# Patient Record
Sex: Female | Born: 1945 | Race: White | Hispanic: No | Smoking: Never smoker
Health system: Southern US, Community
[De-identification: ages and names within clinical notes are randomized; demographics above are authoritative.]

## PROBLEM LIST (undated history)

## (undated) DIAGNOSIS — T4145XA Adverse effect of unspecified anesthetic, initial encounter: Secondary | ICD-10-CM

## (undated) DIAGNOSIS — R112 Nausea with vomiting, unspecified: Secondary | ICD-10-CM

## (undated) DIAGNOSIS — N611 Abscess of the breast and nipple: Secondary | ICD-10-CM

## (undated) DIAGNOSIS — M1990 Unspecified osteoarthritis, unspecified site: Secondary | ICD-10-CM

## (undated) DIAGNOSIS — M21969 Unspecified acquired deformity of unspecified lower leg: Secondary | ICD-10-CM

## (undated) DIAGNOSIS — N83209 Unspecified ovarian cyst, unspecified side: Secondary | ICD-10-CM

## (undated) DIAGNOSIS — IMO0002 Reserved for concepts with insufficient information to code with codable children: Secondary | ICD-10-CM

## (undated) DIAGNOSIS — G5600 Carpal tunnel syndrome, unspecified upper limb: Secondary | ICD-10-CM

## (undated) DIAGNOSIS — Z86010 Personal history of colonic polyps: Principal | ICD-10-CM

## (undated) DIAGNOSIS — L309 Dermatitis, unspecified: Secondary | ICD-10-CM

## (undated) DIAGNOSIS — I10 Essential (primary) hypertension: Secondary | ICD-10-CM

## (undated) DIAGNOSIS — B029 Zoster without complications: Secondary | ICD-10-CM

## (undated) DIAGNOSIS — E785 Hyperlipidemia, unspecified: Secondary | ICD-10-CM

## (undated) DIAGNOSIS — Z9889 Other specified postprocedural states: Secondary | ICD-10-CM

## (undated) DIAGNOSIS — D126 Benign neoplasm of colon, unspecified: Secondary | ICD-10-CM

## (undated) DIAGNOSIS — T8859XA Other complications of anesthesia, initial encounter: Secondary | ICD-10-CM

## (undated) DIAGNOSIS — E119 Type 2 diabetes mellitus without complications: Secondary | ICD-10-CM

## (undated) HISTORY — DX: Benign neoplasm of colon, unspecified: D12.6

## (undated) HISTORY — DX: Unspecified osteoarthritis, unspecified site: M19.90

## (undated) HISTORY — PX: LUMBAR LAMINECTOMY: SHX95

## (undated) HISTORY — DX: Carpal tunnel syndrome, unspecified upper limb: G56.00

## (undated) HISTORY — PX: OTHER SURGICAL HISTORY: SHX169

## (undated) HISTORY — DX: Abscess of the breast and nipple: N61.1

## (undated) HISTORY — DX: Unspecified ovarian cyst, unspecified side: N83.209

## (undated) HISTORY — DX: Personal history of colonic polyps: Z86.010

## (undated) HISTORY — DX: Type 2 diabetes mellitus without complications: E11.9

## (undated) HISTORY — PX: ABSCESS DRAINAGE: SHX1119

## (undated) HISTORY — DX: Unspecified acquired deformity of unspecified lower leg: M21.969

## (undated) HISTORY — DX: Reserved for concepts with insufficient information to code with codable children: IMO0002

## (undated) HISTORY — PX: BREAST BIOPSY: SHX20

## (undated) HISTORY — DX: Hyperlipidemia, unspecified: E78.5

## (undated) HISTORY — PX: TUBAL LIGATION: SHX77

## (undated) HISTORY — DX: Zoster without complications: B02.9

## (undated) HISTORY — PX: COLONOSCOPY: SHX174

## (undated) HISTORY — DX: Dermatitis, unspecified: L30.9

## (undated) HISTORY — DX: Essential (primary) hypertension: I10

---
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
## 1979-03-14 HISTORY — PX: ABDOMINAL HYSTERECTOMY: SHX81

## 1997-07-20 ENCOUNTER — Ambulatory Visit (HOSPITAL_COMMUNITY): Admission: RE | Admit: 1997-07-20 | Discharge: 1997-07-20 | Payer: Self-pay | Admitting: Family Medicine

## 1997-08-31 ENCOUNTER — Ambulatory Visit (HOSPITAL_COMMUNITY): Admission: RE | Admit: 1997-08-31 | Discharge: 1997-08-31 | Payer: Self-pay | Admitting: Family Medicine

## 1998-03-13 DIAGNOSIS — B029 Zoster without complications: Secondary | ICD-10-CM

## 1998-03-13 DIAGNOSIS — N611 Abscess of the breast and nipple: Secondary | ICD-10-CM

## 1998-03-13 HISTORY — DX: Zoster without complications: B02.9

## 1998-03-13 HISTORY — DX: Abscess of the breast and nipple: N61.1

## 1999-01-11 ENCOUNTER — Encounter: Payer: Self-pay | Admitting: Family Medicine

## 1999-01-11 ENCOUNTER — Encounter: Admission: RE | Admit: 1999-01-11 | Discharge: 1999-01-11 | Payer: Self-pay | Admitting: Family Medicine

## 1999-01-14 ENCOUNTER — Other Ambulatory Visit: Admission: RE | Admit: 1999-01-14 | Discharge: 1999-01-14 | Payer: Self-pay | Admitting: Family Medicine

## 2000-01-19 ENCOUNTER — Other Ambulatory Visit: Admission: RE | Admit: 2000-01-19 | Discharge: 2000-01-19 | Payer: Self-pay | Admitting: Family Medicine

## 2000-04-20 ENCOUNTER — Other Ambulatory Visit: Admission: RE | Admit: 2000-04-20 | Discharge: 2000-04-20 | Payer: Self-pay | Admitting: Family Medicine

## 2001-05-20 ENCOUNTER — Other Ambulatory Visit: Admission: RE | Admit: 2001-05-20 | Discharge: 2001-05-20 | Payer: Self-pay | Admitting: Family Medicine

## 2002-06-02 ENCOUNTER — Encounter: Admission: RE | Admit: 2002-06-02 | Discharge: 2002-06-02 | Payer: Self-pay | Admitting: Family Medicine

## 2002-06-02 ENCOUNTER — Encounter: Payer: Self-pay | Admitting: Family Medicine

## 2002-07-07 ENCOUNTER — Encounter: Payer: Self-pay | Admitting: Family Medicine

## 2004-12-27 ENCOUNTER — Other Ambulatory Visit: Admission: RE | Admit: 2004-12-27 | Discharge: 2004-12-27 | Payer: Self-pay | Admitting: Family Medicine

## 2005-01-18 ENCOUNTER — Encounter: Admission: RE | Admit: 2005-01-18 | Discharge: 2005-01-18 | Payer: Self-pay | Admitting: Family Medicine

## 2007-02-01 ENCOUNTER — Encounter: Payer: Self-pay | Admitting: Family Medicine

## 2007-02-01 ENCOUNTER — Encounter (INDEPENDENT_AMBULATORY_CARE_PROVIDER_SITE_OTHER): Payer: Self-pay | Admitting: *Deleted

## 2007-02-01 LAB — CONVERTED CEMR LAB
Alkaline Phosphatase: 62 units/L
BUN: 17 mg/dL
CO2, serum: 24 mmol/L
Chloride, Serum: 99 mmol/L
Cholesterol: 170 mg/dL
Glucose, Bld: 183 mg/dL
HCT: 43.4 %
Hemoglobin: 14.2 g/dL
Hgb A1c MFr Bld: 9.1 %
LDL Cholesterol: 77 mg/dL
Platelets: 305 10*3/uL
RBC count: 4.8 10*6/uL
Total Bilirubin: 0.4 mg/dL
Total Protein: 7.2 g/dL
Triglycerides: 214 mg/dL
WBC, blood: 8 10*3/uL

## 2007-03-01 ENCOUNTER — Encounter: Admission: RE | Admit: 2007-03-01 | Discharge: 2007-03-01 | Payer: Self-pay | Admitting: Family Medicine

## 2007-03-01 ENCOUNTER — Encounter: Payer: Self-pay | Admitting: Family Medicine

## 2007-07-07 ENCOUNTER — Encounter
Admission: RE | Admit: 2007-07-07 | Discharge: 2007-07-07 | Payer: Self-pay | Admitting: Physical Medicine and Rehabilitation

## 2007-07-24 ENCOUNTER — Encounter: Payer: Self-pay | Admitting: Family Medicine

## 2007-07-24 ENCOUNTER — Encounter (INDEPENDENT_AMBULATORY_CARE_PROVIDER_SITE_OTHER): Payer: Self-pay | Admitting: *Deleted

## 2007-07-24 LAB — CONVERTED CEMR LAB: Hgb A1c MFr Bld: 8.7 %

## 2007-08-20 ENCOUNTER — Encounter: Admission: RE | Admit: 2007-08-20 | Discharge: 2007-08-20 | Payer: Self-pay | Admitting: Specialist

## 2007-09-16 ENCOUNTER — Encounter: Payer: Self-pay | Admitting: Family Medicine

## 2007-09-20 ENCOUNTER — Inpatient Hospital Stay (HOSPITAL_COMMUNITY): Admission: RE | Admit: 2007-09-20 | Discharge: 2007-09-21 | Payer: Self-pay | Admitting: Specialist

## 2007-10-16 ENCOUNTER — Encounter: Admission: RE | Admit: 2007-10-16 | Discharge: 2007-10-16 | Payer: Self-pay | Admitting: Family Medicine

## 2007-10-16 ENCOUNTER — Encounter: Payer: Self-pay | Admitting: Family Medicine

## 2008-01-21 ENCOUNTER — Encounter (INDEPENDENT_AMBULATORY_CARE_PROVIDER_SITE_OTHER): Payer: Self-pay | Admitting: *Deleted

## 2008-01-31 ENCOUNTER — Ambulatory Visit: Payer: Self-pay | Admitting: Family Medicine

## 2008-01-31 DIAGNOSIS — E1169 Type 2 diabetes mellitus with other specified complication: Secondary | ICD-10-CM | POA: Insufficient documentation

## 2008-01-31 DIAGNOSIS — E785 Hyperlipidemia, unspecified: Secondary | ICD-10-CM

## 2008-01-31 DIAGNOSIS — Z9889 Other specified postprocedural states: Secondary | ICD-10-CM | POA: Insufficient documentation

## 2008-01-31 DIAGNOSIS — E1140 Type 2 diabetes mellitus with diabetic neuropathy, unspecified: Secondary | ICD-10-CM

## 2008-01-31 DIAGNOSIS — M479 Spondylosis, unspecified: Secondary | ICD-10-CM | POA: Insufficient documentation

## 2008-01-31 DIAGNOSIS — I10 Essential (primary) hypertension: Secondary | ICD-10-CM | POA: Insufficient documentation

## 2008-02-12 ENCOUNTER — Telehealth (INDEPENDENT_AMBULATORY_CARE_PROVIDER_SITE_OTHER): Payer: Self-pay | Admitting: *Deleted

## 2008-02-13 ENCOUNTER — Telehealth (INDEPENDENT_AMBULATORY_CARE_PROVIDER_SITE_OTHER): Payer: Self-pay | Admitting: *Deleted

## 2008-02-21 ENCOUNTER — Ambulatory Visit: Payer: Self-pay | Admitting: Family Medicine

## 2008-02-21 DIAGNOSIS — J0100 Acute maxillary sinusitis, unspecified: Secondary | ICD-10-CM | POA: Insufficient documentation

## 2008-02-25 ENCOUNTER — Telehealth (INDEPENDENT_AMBULATORY_CARE_PROVIDER_SITE_OTHER): Payer: Self-pay | Admitting: *Deleted

## 2008-02-25 ENCOUNTER — Encounter (INDEPENDENT_AMBULATORY_CARE_PROVIDER_SITE_OTHER): Payer: Self-pay | Admitting: *Deleted

## 2008-02-25 LAB — CONVERTED CEMR LAB
Basophils Absolute: 0 10*3/uL (ref 0.0–0.1)
Basophils Relative: 0.2 % (ref 0.0–3.0)
Bilirubin, Direct: 0.1 mg/dL (ref 0.0–0.3)
CO2: 29 meq/L (ref 19–32)
Calcium: 9.6 mg/dL (ref 8.4–10.5)
Cholesterol: 157 mg/dL (ref 0–200)
Creatinine, Ser: 0.7 mg/dL (ref 0.4–1.2)
Eosinophils Absolute: 0.3 10*3/uL (ref 0.0–0.7)
GFR calc Af Amer: 109 mL/min
GFR calc non Af Amer: 90 mL/min
Hemoglobin: 14.1 g/dL (ref 12.0–15.0)
Hgb A1c MFr Bld: 9 % — ABNORMAL HIGH (ref 4.6–6.0)
LDL Cholesterol: 76 mg/dL (ref 0–99)
Lymphocytes Relative: 34.4 % (ref 12.0–46.0)
MCHC: 34.9 g/dL (ref 30.0–36.0)
MCV: 89.8 fL (ref 78.0–100.0)
Monocytes Absolute: 0.9 10*3/uL (ref 0.1–1.0)
Neutro Abs: 4.8 10*3/uL (ref 1.4–7.7)
RBC: 4.49 M/uL (ref 3.87–5.11)
RDW: 13 % (ref 11.5–14.6)
Sodium: 138 meq/L (ref 135–145)
TSH: 1.93 microintl units/mL (ref 0.35–5.50)
Total Bilirubin: 0.9 mg/dL (ref 0.3–1.2)
Triglycerides: 190 mg/dL — ABNORMAL HIGH (ref 0–149)
VLDL: 38 mg/dL (ref 0–40)

## 2008-03-23 ENCOUNTER — Telehealth (INDEPENDENT_AMBULATORY_CARE_PROVIDER_SITE_OTHER): Payer: Self-pay | Admitting: *Deleted

## 2008-04-14 ENCOUNTER — Telehealth (INDEPENDENT_AMBULATORY_CARE_PROVIDER_SITE_OTHER): Payer: Self-pay | Admitting: *Deleted

## 2008-04-21 ENCOUNTER — Encounter: Admission: RE | Admit: 2008-04-21 | Discharge: 2008-04-21 | Payer: Self-pay | Admitting: Family Medicine

## 2008-04-24 ENCOUNTER — Encounter (INDEPENDENT_AMBULATORY_CARE_PROVIDER_SITE_OTHER): Payer: Self-pay | Admitting: *Deleted

## 2008-04-27 ENCOUNTER — Telehealth (INDEPENDENT_AMBULATORY_CARE_PROVIDER_SITE_OTHER): Payer: Self-pay | Admitting: *Deleted

## 2008-06-10 ENCOUNTER — Ambulatory Visit: Payer: Self-pay | Admitting: Family Medicine

## 2008-06-10 DIAGNOSIS — E876 Hypokalemia: Secondary | ICD-10-CM

## 2008-06-10 DIAGNOSIS — E1149 Type 2 diabetes mellitus with other diabetic neurological complication: Secondary | ICD-10-CM | POA: Insufficient documentation

## 2008-06-10 HISTORY — DX: Hypokalemia: E87.6

## 2008-06-15 ENCOUNTER — Encounter (INDEPENDENT_AMBULATORY_CARE_PROVIDER_SITE_OTHER): Payer: Self-pay | Admitting: *Deleted

## 2008-06-15 LAB — CONVERTED CEMR LAB
BUN: 20 mg/dL (ref 6–23)
CO2: 29 meq/L (ref 19–32)
Chloride: 105 meq/L (ref 96–112)
Hgb A1c MFr Bld: 7.5 % — ABNORMAL HIGH (ref 4.6–6.5)
Potassium: 4.1 meq/L (ref 3.5–5.1)

## 2008-06-19 ENCOUNTER — Encounter: Payer: Self-pay | Admitting: Family Medicine

## 2008-07-16 ENCOUNTER — Encounter: Payer: Self-pay | Admitting: Family Medicine

## 2008-07-27 ENCOUNTER — Telehealth (INDEPENDENT_AMBULATORY_CARE_PROVIDER_SITE_OTHER): Payer: Self-pay | Admitting: *Deleted

## 2008-08-04 ENCOUNTER — Telehealth (INDEPENDENT_AMBULATORY_CARE_PROVIDER_SITE_OTHER): Payer: Self-pay | Admitting: *Deleted

## 2008-09-09 ENCOUNTER — Telehealth (INDEPENDENT_AMBULATORY_CARE_PROVIDER_SITE_OTHER): Payer: Self-pay | Admitting: *Deleted

## 2008-09-09 ENCOUNTER — Ambulatory Visit: Payer: Self-pay | Admitting: Family Medicine

## 2008-09-10 LAB — CONVERTED CEMR LAB
ALT: 25 units/L (ref 0–35)
Chloride: 103 meq/L (ref 96–112)
Cholesterol: 159 mg/dL (ref 0–200)
GFR calc non Af Amer: 107.27 mL/min (ref >60)
HDL: 53.2 mg/dL (ref >39.00)
Hgb A1c MFr Bld: 6.6 % — ABNORMAL HIGH (ref 4.6–6.5)
LDL Cholesterol: 81 mg/dL (ref 0–99)
Potassium: 3.8 meq/L (ref 3.5–5.1)
Sodium: 141 meq/L (ref 135–145)
Total Protein: 7.3 g/dL (ref 6.0–8.3)
VLDL: 24.6 mg/dL (ref 0.0–40.0)

## 2008-09-17 ENCOUNTER — Encounter (INDEPENDENT_AMBULATORY_CARE_PROVIDER_SITE_OTHER): Payer: Self-pay | Admitting: *Deleted

## 2008-10-15 ENCOUNTER — Telehealth (INDEPENDENT_AMBULATORY_CARE_PROVIDER_SITE_OTHER): Payer: Self-pay | Admitting: *Deleted

## 2008-10-23 ENCOUNTER — Encounter: Admission: RE | Admit: 2008-10-23 | Discharge: 2008-10-23 | Payer: Self-pay | Admitting: Specialist

## 2008-11-04 ENCOUNTER — Telehealth (INDEPENDENT_AMBULATORY_CARE_PROVIDER_SITE_OTHER): Payer: Self-pay | Admitting: *Deleted

## 2008-11-23 ENCOUNTER — Encounter: Payer: Self-pay | Admitting: Family Medicine

## 2008-11-25 ENCOUNTER — Ambulatory Visit: Payer: Self-pay | Admitting: Family Medicine

## 2008-12-14 ENCOUNTER — Telehealth (INDEPENDENT_AMBULATORY_CARE_PROVIDER_SITE_OTHER): Payer: Self-pay | Admitting: *Deleted

## 2008-12-16 ENCOUNTER — Observation Stay (HOSPITAL_COMMUNITY): Admission: AD | Admit: 2008-12-16 | Discharge: 2008-12-18 | Payer: Self-pay | Admitting: Specialist

## 2008-12-25 ENCOUNTER — Telehealth (INDEPENDENT_AMBULATORY_CARE_PROVIDER_SITE_OTHER): Payer: Self-pay | Admitting: *Deleted

## 2008-12-31 ENCOUNTER — Telehealth (INDEPENDENT_AMBULATORY_CARE_PROVIDER_SITE_OTHER): Payer: Self-pay | Admitting: *Deleted

## 2009-01-07 ENCOUNTER — Telehealth (INDEPENDENT_AMBULATORY_CARE_PROVIDER_SITE_OTHER): Payer: Self-pay | Admitting: *Deleted

## 2009-01-14 ENCOUNTER — Telehealth (INDEPENDENT_AMBULATORY_CARE_PROVIDER_SITE_OTHER): Payer: Self-pay | Admitting: *Deleted

## 2009-02-08 ENCOUNTER — Telehealth (INDEPENDENT_AMBULATORY_CARE_PROVIDER_SITE_OTHER): Payer: Self-pay | Admitting: *Deleted

## 2009-02-15 ENCOUNTER — Ambulatory Visit: Payer: Self-pay | Admitting: Family Medicine

## 2009-02-15 ENCOUNTER — Encounter: Payer: Self-pay | Admitting: Family Medicine

## 2009-02-15 DIAGNOSIS — E8941 Symptomatic postprocedural ovarian failure: Secondary | ICD-10-CM

## 2009-02-16 ENCOUNTER — Telehealth (INDEPENDENT_AMBULATORY_CARE_PROVIDER_SITE_OTHER): Payer: Self-pay | Admitting: *Deleted

## 2009-02-16 LAB — CONVERTED CEMR LAB
ALT: 24 units/L (ref 0–35)
BUN: 13 mg/dL (ref 6–23)
Bilirubin, Direct: 0 mg/dL (ref 0.0–0.3)
Calcium: 9.6 mg/dL (ref 8.4–10.5)
Chloride: 99 meq/L (ref 96–112)
Cholesterol: 175 mg/dL (ref 0–200)
Creatinine, Ser: 0.6 mg/dL (ref 0.4–1.2)
Creatinine,U: 126.5 mg/dL
Direct LDL: 94.4 mg/dL
Eosinophils Relative: 5.2 % — ABNORMAL HIGH (ref 0.0–5.0)
GFR calc non Af Amer: 107.12 mL/min (ref >60)
HDL: 46.3 mg/dL (ref >39.00)
Lymphocytes Relative: 34.6 % (ref 12.0–46.0)
MCV: 92.7 fL (ref 78.0–100.0)
Microalb, Ur: 0.8 mg/dL (ref 0.0–1.9)
Monocytes Absolute: 0.8 10*3/uL (ref 0.1–1.0)
Neutrophils Relative %: 49.4 % (ref 43.0–77.0)
Platelets: 273 10*3/uL (ref 150.0–400.0)
Total Bilirubin: 0.7 mg/dL (ref 0.3–1.2)
Triglycerides: 221 mg/dL — ABNORMAL HIGH (ref 0.0–149.0)
VLDL: 44.2 mg/dL — ABNORMAL HIGH (ref 0.0–40.0)
WBC: 8.1 10*3/uL (ref 4.5–10.5)

## 2009-03-16 ENCOUNTER — Telehealth: Payer: Self-pay | Admitting: Family Medicine

## 2009-04-22 ENCOUNTER — Encounter: Admission: RE | Admit: 2009-04-22 | Discharge: 2009-04-22 | Payer: Self-pay | Admitting: Family Medicine

## 2009-04-22 ENCOUNTER — Encounter: Payer: Self-pay | Admitting: Family Medicine

## 2009-04-22 LAB — HM MAMMOGRAPHY: HM Mammogram: NEGATIVE

## 2009-05-11 ENCOUNTER — Telehealth (INDEPENDENT_AMBULATORY_CARE_PROVIDER_SITE_OTHER): Payer: Self-pay | Admitting: *Deleted

## 2009-05-17 ENCOUNTER — Ambulatory Visit: Payer: Self-pay | Admitting: Family Medicine

## 2009-05-17 ENCOUNTER — Telehealth (INDEPENDENT_AMBULATORY_CARE_PROVIDER_SITE_OTHER): Payer: Self-pay | Admitting: *Deleted

## 2009-05-18 ENCOUNTER — Encounter (INDEPENDENT_AMBULATORY_CARE_PROVIDER_SITE_OTHER): Payer: Self-pay | Admitting: *Deleted

## 2009-05-18 ENCOUNTER — Telehealth (INDEPENDENT_AMBULATORY_CARE_PROVIDER_SITE_OTHER): Payer: Self-pay | Admitting: *Deleted

## 2009-05-26 LAB — CONVERTED CEMR LAB
ALT: 32 units/L (ref 0–35)
Albumin: 4.2 g/dL (ref 3.5–5.2)
BUN: 15 mg/dL (ref 6–23)
CO2: 30 meq/L (ref 19–32)
Chloride: 104 meq/L (ref 96–112)
Cholesterol: 146 mg/dL (ref 0–200)
Creatinine, Ser: 0.7 mg/dL (ref 0.4–1.2)
Direct LDL: 72.8 mg/dL
Hgb A1c MFr Bld: 7 % — ABNORMAL HIGH (ref 4.6–6.5)
Total Protein: 7.7 g/dL (ref 6.0–8.3)
VLDL: 44.8 mg/dL — ABNORMAL HIGH (ref 0.0–40.0)

## 2009-06-04 ENCOUNTER — Telehealth (INDEPENDENT_AMBULATORY_CARE_PROVIDER_SITE_OTHER): Payer: Self-pay | Admitting: *Deleted

## 2009-06-08 ENCOUNTER — Telehealth: Payer: Self-pay | Admitting: Family Medicine

## 2009-06-09 ENCOUNTER — Telehealth (INDEPENDENT_AMBULATORY_CARE_PROVIDER_SITE_OTHER): Payer: Self-pay | Admitting: *Deleted

## 2009-06-18 ENCOUNTER — Encounter: Admission: RE | Admit: 2009-06-18 | Discharge: 2009-06-18 | Payer: Self-pay | Admitting: Specialist

## 2009-08-12 ENCOUNTER — Telehealth (INDEPENDENT_AMBULATORY_CARE_PROVIDER_SITE_OTHER): Payer: Self-pay | Admitting: *Deleted

## 2009-09-24 ENCOUNTER — Ambulatory Visit: Payer: Self-pay | Admitting: Family Medicine

## 2009-09-27 ENCOUNTER — Telehealth: Payer: Self-pay | Admitting: Family Medicine

## 2009-09-27 ENCOUNTER — Encounter: Payer: Self-pay | Admitting: Family Medicine

## 2009-09-27 LAB — CONVERTED CEMR LAB
AST: 31 units/L (ref 0–37)
Albumin: 4.6 g/dL (ref 3.5–5.2)
Alkaline Phosphatase: 39 units/L (ref 39–117)
BUN: 23 mg/dL (ref 6–23)
Basophils Absolute: 0.1 10*3/uL (ref 0.0–0.1)
Cholesterol: 165 mg/dL (ref 0–200)
Creatinine, Ser: 0.7 mg/dL (ref 0.4–1.2)
Direct LDL: 94.7 mg/dL
Eosinophils Absolute: 0.4 10*3/uL (ref 0.0–0.7)
GFR calc non Af Amer: 89.49 mL/min (ref >60)
HCT: 39.9 % (ref 36.0–46.0)
Lymphs Abs: 3.5 10*3/uL (ref 0.7–4.0)
Monocytes Relative: 11.7 % (ref 3.0–12.0)
Platelets: 391 10*3/uL (ref 150.0–400.0)
RDW: 13.5 % (ref 11.5–14.6)
Total Protein: 7.2 g/dL (ref 6.0–8.3)

## 2009-09-28 ENCOUNTER — Telehealth (INDEPENDENT_AMBULATORY_CARE_PROVIDER_SITE_OTHER): Payer: Self-pay | Admitting: *Deleted

## 2009-09-29 ENCOUNTER — Ambulatory Visit: Payer: Self-pay | Admitting: Cardiology

## 2009-09-29 ENCOUNTER — Ambulatory Visit: Payer: Self-pay

## 2009-09-29 ENCOUNTER — Encounter: Payer: Self-pay | Admitting: Cardiology

## 2009-09-29 ENCOUNTER — Encounter (HOSPITAL_COMMUNITY)
Admission: RE | Admit: 2009-09-29 | Discharge: 2009-12-06 | Payer: Self-pay | Source: Home / Self Care | Admitting: Family Medicine

## 2009-10-21 ENCOUNTER — Ambulatory Visit: Payer: Self-pay | Admitting: Vascular Surgery

## 2009-10-28 ENCOUNTER — Inpatient Hospital Stay (HOSPITAL_COMMUNITY): Admission: RE | Admit: 2009-10-28 | Discharge: 2009-11-01 | Payer: Self-pay | Admitting: Orthopedic Surgery

## 2009-11-18 ENCOUNTER — Encounter: Payer: Self-pay | Admitting: Family Medicine

## 2010-02-24 ENCOUNTER — Telehealth (INDEPENDENT_AMBULATORY_CARE_PROVIDER_SITE_OTHER): Payer: Self-pay | Admitting: *Deleted

## 2010-03-28 ENCOUNTER — Encounter: Payer: Self-pay | Admitting: Family Medicine

## 2010-03-28 ENCOUNTER — Encounter: Payer: Self-pay | Admitting: Internal Medicine

## 2010-03-28 ENCOUNTER — Ambulatory Visit
Admission: RE | Admit: 2010-03-28 | Discharge: 2010-03-28 | Payer: Self-pay | Source: Home / Self Care | Attending: Family Medicine | Admitting: Family Medicine

## 2010-03-28 ENCOUNTER — Other Ambulatory Visit: Payer: Self-pay | Admitting: Family Medicine

## 2010-03-28 DIAGNOSIS — D239 Other benign neoplasm of skin, unspecified: Secondary | ICD-10-CM

## 2010-03-28 HISTORY — DX: Other benign neoplasm of skin, unspecified: D23.9

## 2010-03-28 LAB — CONVERTED CEMR LAB
Ketones, urine, test strip: NEGATIVE
Nitrite: NEGATIVE
Specific Gravity, Urine: 1.02
WBC Urine, dipstick: NEGATIVE

## 2010-03-28 LAB — CBC WITH DIFFERENTIAL/PLATELET
Basophils Absolute: 0.1 10*3/uL (ref 0.0–0.1)
Basophils Relative: 0.9 % (ref 0.0–3.0)
Eosinophils Absolute: 0.5 10*3/uL (ref 0.0–0.7)
Eosinophils Relative: 4.8 % (ref 0.0–5.0)
HCT: 37.2 % (ref 36.0–46.0)
Hemoglobin: 12.4 g/dL (ref 12.0–15.0)
Lymphocytes Relative: 31.3 % (ref 12.0–46.0)
Lymphs Abs: 3.3 10*3/uL (ref 0.7–4.0)
MCHC: 33.4 g/dL (ref 30.0–36.0)
MCV: 87.6 fl (ref 78.0–100.0)
Monocytes Absolute: 1 10*3/uL (ref 0.1–1.0)
Monocytes Relative: 9.8 % (ref 3.0–12.0)
Neutro Abs: 5.6 10*3/uL (ref 1.4–7.7)
Neutrophils Relative %: 53.2 % (ref 43.0–77.0)
Platelets: 358 10*3/uL (ref 150.0–400.0)
RBC: 4.24 Mil/uL (ref 3.87–5.11)
RDW: 16.8 % — ABNORMAL HIGH (ref 11.5–14.6)
WBC: 10.5 10*3/uL (ref 4.5–10.5)

## 2010-03-28 LAB — LIPID PANEL
Cholesterol: 162 mg/dL (ref 0–200)
HDL: 47.2 mg/dL (ref >39.00)
LDL Cholesterol: 81 mg/dL (ref 0–99)
Total CHOL/HDL Ratio: 3
Triglycerides: 170 mg/dL — ABNORMAL HIGH (ref 0.0–149.0)
VLDL: 34 mg/dL (ref 0.0–40.0)

## 2010-03-28 LAB — HEPATIC FUNCTION PANEL
ALT: 21 U/L (ref 0–35)
AST: 24 U/L (ref 0–37)
Albumin: 4.5 g/dL (ref 3.5–5.2)
Alkaline Phosphatase: 36 U/L — ABNORMAL LOW (ref 39–117)
Bilirubin, Direct: 0.1 mg/dL (ref 0.0–0.3)
Total Bilirubin: 0.6 mg/dL (ref 0.3–1.2)
Total Protein: 7.1 g/dL (ref 6.0–8.3)

## 2010-03-28 LAB — BASIC METABOLIC PANEL
BUN: 19 mg/dL (ref 6–23)
CO2: 26 mEq/L (ref 19–32)
Calcium: 9.7 mg/dL (ref 8.4–10.5)
Chloride: 100 mEq/L (ref 96–112)
Creatinine, Ser: 0.9 mg/dL (ref 0.4–1.2)
GFR: 68.61 mL/min (ref >60.00)
Glucose, Bld: 112 mg/dL — ABNORMAL HIGH (ref 70–99)
Potassium: 4 mEq/L (ref 3.5–5.1)
Sodium: 137 mEq/L (ref 135–145)

## 2010-03-28 LAB — MICROALBUMIN / CREATININE URINE RATIO
Creatinine,U: 134.4 mg/dL
Microalb Creat Ratio: 0.5 mg/g (ref 0.0–30.0)
Microalb, Ur: 0.7 mg/dL (ref 0.0–1.9)

## 2010-03-28 LAB — HEMOGLOBIN A1C: Hgb A1c MFr Bld: 7.8 % — ABNORMAL HIGH (ref 4.6–6.5)

## 2010-04-02 ENCOUNTER — Other Ambulatory Visit: Payer: Self-pay | Admitting: Family Medicine

## 2010-04-02 DIAGNOSIS — Z1239 Encounter for other screening for malignant neoplasm of breast: Secondary | ICD-10-CM

## 2010-04-12 NOTE — Medication Information (Signed)
Summary: Med Issue [facility]  Med Issue Order/[facility]   Imported By: Lanelle Bal 11/29/2009 10:23:11  _____________________________________________________________________  External Attachment:    Type:   Image     Comment:   External Document

## 2010-04-12 NOTE — Progress Notes (Signed)
Summary: klor-con   Phone Note Refill Request Message from:  Fax from Pharmacy on May 17, 2009 12:50 PM  pot chlor er tabs [facility] fax [phone]   Method Requested: Fax to Local Pharmacy Next Appointment Scheduled: no appt Initial call taken by: Barb Merino,  May 17, 2009 12:51 PM    Prescriptions: KLOR-CON M20 20 MEQ CR-TABS (POTASSIUM CHLORIDE CRYS CR) take one tablet daily  #90 x 1   Entered by:   Doristine Devoid   Authorized by:   Neena Rhymes MD   Signed by:   Doristine Devoid on 05/17/2009   Method used:   Electronically to        [facility] (mail-order)             ,          Ph: [phone]       Fax: [phone]   RxID:   [id number]

## 2010-04-12 NOTE — Progress Notes (Signed)
Summary: [facility]ills [facility]  Phone Note Refill Request Message from:  Pharmacy on [facility] [phone]  Refills Requested: Medication #1:  GABAPENTIN 300 MG  CAPS take one tablet three times a day  Medication #2:  METFORMIN HCL 500 MG TABS 2 by mouth two times a day  Medication #3:  KLOR-CON M20 20 MEQ CR-TABS take one tablet daily  Medication #4:  LISINOPRIL 5 MG TABS take one tablet daily hctz 25mg ,simvastatin 40mg , glipizide10mg   Initial call taken by: [name],  August 12, 2009 12:47 PM    Prescriptions: GLIPIZIDE 10 MG TABS (GLIPIZIDE) take 2 tablets two times a day  #360 x 0   Entered by:   [name]   Authorized by:   Neena Rhymes MD   Signed by:   [name] on 08/12/2009   Method used:   Faxed to ...       [facility] (mail-order)             ,          Ph: [phone]       Fax: [phone]   RxID:   [id number] SIMVASTATIN 40 MG TABS (SIMVASTATIN) take one tablet daily  #90 x 0   Entered by:   [name]   Authorized by:   Neena Rhymes MD   Signed by:   [name] on 08/12/2009   Method used:   Faxed to ...       [facility]* (mail-order)             ,          Ph: [phone]       Fax: [phone]   RxID:   [id number] HYDROCHLOROTHIAZIDE 25 MG TABS (HYDROCHLOROTHIAZIDE) take one tablet daily  #90 x 0   Entered by:   [name]   Authorized by:   Neena Rhymes MD   Signed by:   [name] on 08/12/2009   Method used:   Faxed to ...       [facility]* (mail-order)             ,          Ph: [phone]       Fax: [phone]   RxID:   [id number] LISINOPRIL 5 MG TABS (LISINOPRIL) take one tablet daily  #90 x 0   Entered by:   [name]   Authorized by:   Neena Rhymes MD   Signed by:   [name] on 08/12/2009   Method used:   Faxed to ...       [facility] (mail-order)             ,          Ph: [phone]       Fax: [phone]   RxID:   [id number] KLOR-CON M20 20 MEQ CR-TABS  (POTASSIUM CHLORIDE CRYS CR) take one tablet daily  #90 x 0   Entered by:   [name]   Authorized by:   Neena Rhymes MD   Signed by:   [name] on 08/12/2009   Method used:   Faxed to ...       [facility]* (mail-order)             ,          Ph: [phone]       Fax: [phone]   RxID:   [id number] METFORMIN HCL 500 MG TABS (METFORMIN HCL) 2 by mouth two  times a day  #360 x 0   Entered by:   [name]   Authorized by:   Neena Rhymes MD   Signed by:   [name] on 08/12/2009   Method used:   Faxed to ...       [facility] (mail-order)             ,          Ph: [phone]       Fax: [phone]   RxID:   [id number] GABAPENTIN 300 MG  CAPS (GABAPENTIN) take one tablet three times a day  #360 x 0   Entered by:   [name]   Authorized by:   Neena Rhymes MD   Signed by:   [name] on 08/12/2009   Method used:   Faxed to ...       [facility] (mail-order)             ,          Ph: [phone]       Fax: [phone]   RxID:   [id number]

## 2010-04-12 NOTE — Progress Notes (Signed)
Summary: re;antara  Phone Note Call from Patient Call back at Home Phone [phone]   Caller: Patient Summary of Call: pt called her insurance co who says they do not understand why pharmacy says they will not pay, it is a 3 tier med, it is a higher co-pay.  Pharmacy may be putting in wrong codes, pt will call [facility] on [address] and inform .[name]  June 09, 2009 10:57 AM

## 2010-04-12 NOTE — Progress Notes (Signed)
Summary: Samples worked well wants rx  Phone Note Call from Patient Call back at [facility] [employer] [phone]   Caller: Patient Summary of Call: given samples of Antara  3/16/11and pt says med is working well for her would l ike rx sent to [facility] Initial call taken by: [name],  June 04, 2009 3:58 PM  Follow-up for Phone Call        sent Follow-up by: Loreen Freud DO,  June 04, 2009 4:04 PM  Additional Follow-up for Phone Call Additional follow up Details #1::        spoke w/ patient aware prescription sent to pharmacy ..........Marland KitchenDoristine Devoid  June 04, 2009 4:21 PM     Prescriptions: ANTARA 130 MG CAPS (FENOFIBRATE MICRONIZED) 1 by mouth daily.  #30 x 3   Entered and Authorized by:   Loreen Freud DO   Signed by:   Loreen Freud DO on 06/04/2009   Method used:   Electronically to        [facility] (retail)       [address]       [place], [place]  [address]       Ph: [phone]       Fax: [phone]   RxID:   [id number]

## 2010-04-12 NOTE — Progress Notes (Signed)
Summary: refill/med change  Phone Note Refill Request Call back at Home Phone [phone] Message from:  Patient  Refills Requested: Medication #1:  SIMVASTATIN 40 MG TABS take one tablet daily  Medication #2:  TRICOR 145 MG TABS 1 by mouth daily.. pt states that she is unable to tolerate tricor due to legs cramp minutes after taking tricor. pt wants to know if it would be a good idea to switch back to lipitor since the zocor is not able to control the cholesterol by itself.  pt is out of zocor now and needs a refill sent to pharmacy. pt use [facility] [address]........Marland KitchenFelecia Deloach CMA  March 16, 2009 9:36 AM   Initial call taken by: Jeremy Johann CMA,  March 16, 2009 9:33 AM  Follow-up for Phone Call        Her cholesterol is great!  It's her Triglycerides are elevated!  ---  Try Lovaza 1 g--2 by mouth two times a day  can give samples first ---then give rx with discount card Follow-up by: Loreen Freud DO,  March 16, 2009 10:12 AM  Additional Follow-up for Phone Call Additional follow up Details #1::        pt aware that there are samples up front and refills at the pharm. Additional Follow-up by: Army Fossa CMA,  March 16, 2009 10:31 AM    New/Updated Medications: SIMVASTATIN 40 MG TABS (SIMVASTATIN) take one tablet daily LOVAZA 1 GM CAPS (OMEGA-3-ACID ETHYL ESTERS) 1 by mouth two times a day. LOVAZA 1 GM CAPS (OMEGA-3-ACID ETHYL ESTERS) 2 by mouth two times a day. Prescriptions: LOVAZA 1 GM CAPS (OMEGA-3-ACID ETHYL ESTERS) 2 by mouth two times a day.  #30 x 1    Entered by:   Army Fossa CMA   Authorized by:   Loreen Freud DO   Signed by:   Army Fossa CMA on 03/16/2009   Method used:   F[facility]ed to ...       [facility]       [address]       [place], [place]  [address]       Ph: [phone]       Fax: [phone]   RxID:   [id number] SIMVASTATIN 40 MG TABS (SIMVASTATIN) take one tablet daily  #30 x 1  Entered by:   Army Fossa CMA   Authorized by:   Loreen Freud DO   Signed by:   Army Fossa CMA on 03/16/2009   Method used:   Electronically to        [facility] (retail)       [address]       [place], [place]  [address]       Ph: [phone]       Fax: [phone]   RxID:   [id number] LOVAZA 1 GM CAPS (OMEGA-3-ACID ETHYL ESTERS) 1 by mouth two times a day.  #30 x 1   Entered by:   Army Fossa CMA   Authorized by:   Loreen Freud DO   Signed by:   Army Fossa CMA on 03/16/2009   Method used:   Electronically to        [facility] (retail)       [address]       [place], [place]  [address]       Ph: [phone]       Fax: [phone]   RxID:   [id number]  called pharm and cancelled the rx for lovaza 1 by mouth two times a day.

## 2010-04-12 NOTE — Assessment & Plan Note (Signed)
Summary: fasting/medical clearance//kn   Vital Signs:  Patient profile:   65 year old female Height:      68 inches Weight:      221 pounds BMI:     33.72 O2 Sat:      94 % on Room air Pulse rate:   94 / minute BP sitting:   124 / 82  (left arm)  Vitals Entered By: Doristine Devoid CMA (September 24, 2009 10:39 AM)  O2 Flow:  Room air CC: medical clearance and fasting labs, Pre-op Evaluation   History of Present Illness: 65 yo woman here today for  1) Surgical clearance- see below  2) DM- due for labs.  has lost 10 lbs since Dec.  CBGs 90s-130s.  taking Glipizide and Metformin.  3) hyperlipidemia- started Antara in March.  needs repeat labs.  4) HTN- BP well controlled. on lisinopril and HCTZ.  5) Recurrent UTIs- takes Bactrim as needed for 5 days at a time when having sxs.  Pre-op Evaluation      I was asked to see this delightful patient today for Pre-op Evaluation.  The patient denies respiratory symptoms, GI bleeding, chest pain, edema, PND, heavy ETOH use, and smoking.  Patient has no history of acute or recent MI, unstable or severe angina, decompensated CHF, high grade AV block, symptomatic ventricular arrhythmia, and severe valvular disease.  Positive PMH placing the patient at moderate risk for surgery includes diabetes(m).  Patient has no history of mild angina(m), previous MI(m), compensated CHF(m), renal insufficiency(m), advanced age(l), abnormal ECG(l), low functional capacity(l), stroke history(l), and uncontrolled HTN(l).  Conditions requiring action prior to surgery include diabetes meds.  There is no history of antiplatelet agents, chronic steroids, warfarin, antianginal meds, bleeding disorder, and FH anesthesia reaction.    Preventive Screening-Counseling & Management  Alcohol-Tobacco     Alcohol drinks/day: 0     Smoking Status: never      Sexual History:  currently monogamous.        Drug Use:  never.    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 Take 2 Tablets Two Times A Day 2)  Klor-Con M20 20 Meq Cr-Tabs (Potassium Chloride Crys Cr) .... Take One Tablet Daily 3)  Simvastatin 40 Mg Tabs (Simvastatin) .... Take One Tablet Daily 4)  Hydrochlorothiazide 25 Mg Tabs (Hydrochlorothiazide) .... Take One Tablet Daily 5)  Lisinopril 5 Mg Tabs (Lisinopril) .... Take One Tablet Daily 6)  Methocarbamol 500 Mg Tabs (Methocarbamol) .... Take One Tablet Daily As Needed For Back 7)  Tramadol Hcl 50 Mg Tabs (Tramadol Hcl) .... Take One Tablet Daily As Needed 8)  Fluconazole 150 Mg Tabs (Fluconazole) .Marland Kitchen.. 1 Tab By Mouth X1 Dose, May Repeat in 72 Hours If Sxs Persist 9)  Septra Ds 800-160 Mg Tabs (Sulfamethoxazole-Trimethoprim) .... Take One Tablet Daily 10)  Gabapentin 300 Mg  Caps (Gabapentin) .... Take One Tablet Three Times A Day 11)  Metformin Hcl 500 Mg Tabs (Metformin Hcl) .... 2 By Mouth Two Times A Day 12)  Tylenol Arthritis  Pain 650 Mg Cr-Tabs (Acetaminophen) 13)  Vitamin C 500 Mg .... Daily 14)  Vitamin E 400 Units .... Daily 15)  One-A-Day Extras Antioxidant  Caps (Multiple Vitamins-Minerals) 16)  Aspirin 81 Mg Chew (Aspirin) .... Daily 17)  Tylenol Pm Extra Strength 500-25 Mg Tabs (Diphenhydramine-Apap (Sleep)) 18)  Fish Oil  Oil (Fish Oil) .... Take Daily 19)  Antara 130 Mg Caps (Fenofibrate Micronized) .Marland Kitchen.. 1 By Mouth Daily.  Allergies (verified): No Known Drug Allergies  Past History:  Past Medical History: Last updated:  01/31/2008 Diabetes mellitus, type II Hyperlipidemia Hypertension Degenerative Disc Disease  Past Surgical History: Last updated: 02/15/2009 Laminectomy-lumbar-- L4,L5, S1 2009,   2010 3 Bulging Disc Caesarean section-x2 Tubal ligation Hysterectomy  Social History: Last updated: 01/31/2008 Married Never Smoked Alcohol use-no  Social History: Sexual History:  currently monogamous  Review of Systems      See HPI  Physical Exam  General:  Well-developed,well-nourished,in no acute distress; alert,appropriate and cooperative throughout examination Head:  Normocephalic and atraumatic without obvious abnormalities. No apparent alopecia or balding. Neck:  No deformities, masses, or tenderness noted.no carotid bruits.   Lungs:  Normal respiratory effort, chest expands symmetrically. Lungs are clear to auscultation, no crackles or wheezes. Heart:  Normal rate and regular rhythm. S1 and S2 normal without gallop, murmur, click, rub or other extra sounds. Pulses:  +1 DP, +2 radial, carotid Extremities:  no C/C/E Neurologic:  gait normal and DTRs symmetrical and normal.   Psych:  Cognition and judgment appear intact. Alert and cooperative with normal attention span and concentration. No apparent delusions, illusions, hallucinations   Impression & Recommendations:  Problem # 1:  PREOPERATIVE EXAMINATION (ICD-V72.84) Assessment Unchanged pt's ROS, PMH, and PE don't identify any high risk factors.  pt's diabetes is well controlled, as are cholesterol and BP.  EKG WNL.  but given pt's risk factors and age will get stress test prior to surgery.  pt cleared for surgery once this complete.  form completed and will fax office note to Dr Shon Baton. Orders: Cardiology Referral (Cardiology)  Problem # 2:  DIABETES MELLITUS, TYPE II (ICD-250.00) Assessment: Unchanged CBG log is good.  due for labs.  will adjust meds as needed. Her updated medication list for this problem includes:    Glipizide  10 Mg Tabs (Glipizide) .Marland Kitchen... Take 2 tablets two times a day    Lisinopril 5 Mg Tabs (Lisinopril) .Marland Kitchen... Take one tablet daily    Metformin Hcl 500 Mg Tabs (Metformin hcl) .Marland Kitchen... 2 by mouth two times a day    Aspirin 81 Mg Chew (Aspirin) .Marland Kitchen... Daily  Orders: Venipuncture ([address]) TLB-A1C / Hgb A1C (Glycohemoglobin) (83036-A1C) TLB-BMP (Basic Metabolic Panel-BMET) (80048-METABOL) Cardiology Referral (Cardiology)  Problem # 3:  HYPERLIPIDEMIA (ICD-272.4) Assessment: Unchanged check labs to assess Trigs since starting Antara Her updated medication list for this problem includes:    Simvastatin 40 Mg Tabs (Simvastatin) .Marland Kitchen... Take one tablet daily    Antara 130 Mg Caps (Fenofibrate micronized) .Marland Kitchen... 1 by mouth daily.  Orders: TLB-Lipid Panel (80061-LIPID) TLB-Hepatic/Liver Function Pnl (80076-HEPATIC) Cardiology Referral (Cardiology)  Problem # 4:  HYPERTENSION (ICD-401.9) Assessment: Unchanged well controlled.  will follow Her updated medication list for this problem includes:    Hydrochlorothiazide 25 Mg Tabs (Hydrochlorothiazide) .Marland Kitchen... Take one tablet daily    Lisinopril 5 Mg Tabs (Lisinopril) .Marland Kitchen... Take one tablet daily  Orders: TLB-CBC Platelet - w/Differential (85025-CBCD) Cardiology Referral (Cardiology)  Complete Medication List: 1)  Glipizide 10 Mg Tabs (Glipizide) .... Take 2 tablets two times  a day 2)  Klor-con M20 20 Meq Cr-tabs (Potassium chloride crys cr) .... Take one tablet daily 3)  Simvastatin 40 Mg Tabs (Simvastatin) .... Take one tablet daily 4)  Hydrochlorothiazide 25 Mg Tabs (Hydrochlorothiazide) .... Take one tablet daily 5)  Lisinopril 5 Mg Tabs (Lisinopril) .... Take one tablet daily 6)  Methocarbamol 500 Mg Tabs (Methocarbamol) .... Take one tablet daily as needed for back 7)  Tramadol Hcl 50 Mg Tabs (Tramadol hcl) .... Take one tablet daily as needed 8)  Fluconazole 150 Mg Tabs (Fluconazole) .Marland Kitchen.. 1 tab by mouth x1 dose, may repeat in 72 hours if sxs  persist 9)  Septra Ds 800-160 Mg Tabs (Sulfamethoxazole-trimethoprim) .... Take one tablet daily 10)  Gabapentin 300 Mg Caps (Gabapentin) .... Take one tablet three times a day 11)  Metformin Hcl 500 Mg Tabs (Metformin hcl) .... 2 by mouth two times a day 12)  Tylenol Arthritis Pain 650 Mg Cr-tabs (Acetaminophen) 13)  Vitamin C 500 Mg  .... Daily 14)  Vitamin E 400 Units  .... Daily 15)  One-a-day Extras Antioxidant Caps (Multiple vitamins-minerals) 16)  Aspirin 81 Mg Chew (Aspirin) .... Daily 17)  Tylenol Pm Extra Strength 500-25 Mg Tabs (Diphenhydramine-apap (sleep)) 18)  Fish Oil Oil (Fish oil) .... Take daily 19)  Antara 130 Mg Caps (Fenofibrate micronized) .Marland Kitchen.. 1 by mouth daily.  Patient Instructions: 1)  Follow up in 3 months- sooner if needed 2)  Someone will call you with your stress test appt- this is just routine 3)  We'll notify you of your lab results 4)  Call with any questions or concerns 5)  GOOD LUCK WITH SURGERY! Prescriptions: SEPTRA DS 800-160 MG TABS (SULFAMETHOXAZOLE-TRIMETHOPRIM) take one tablet daily  #30 x 0   Entered and Authorized by:   Neena Rhymes MD   Signed by:   Neena Rhymes MD on 09/24/2009   Method used:   Electronically to        [facility] (retail)       [address]       [place], [place]  [address]       Ph: [phone]       Fax: [phone]   RxID:   [id number]   Appended Document: fasting/medical clearance//kn   Appended Document: fasting/medical clearance//kn    Clinical Lists Changes  Orders: Added new Service order of EKG w/ Interpretation (93[address]) - Signed

## 2010-04-12 NOTE — Progress Notes (Signed)
Summary: fyi  Phone Note Call from Patient Call back at [facility] Phone [phone]   Caller: Patient Summary of Call: Insurance will not pay for Antara and cannot afford per patient  rx cost $148. and they will not honor the card because ins will not pay.[name]  June 08, 2009 10:55 AM  Initial call taken by: [name],  June 08, 2009 10:55 AM  Follow-up for Phone Call        does she have a formulary so we can see what they will cover?  PT SAYS CAN SHE GO BACK TO THE LIPITOR 40MG ? THE ONLY REASON IT WAS CHANGED 11/04/08 WAS SHE HAD USED UP HER DEDUCTIBLE   SEE 11/04/08 NOTE .[name]  June 08, 2009 2:24 PM  Follow-up by: Loreen Freud DO,  June 08, 2009 12:19 PM  Additional Follow-up for Phone Call Additional follow up Details #1::        lipitor is for cholesterol---antara is for TG. Additional Follow-up by: Loreen Freud DO,  June 08, 2009 4:30 PM    Additional Follow-up for Phone Call Additional follow up Details #2::    pt informed samples given of Antara, pt will look for formulary and call with info .[name]  June 08, 2009 4:44 PM

## 2010-04-12 NOTE — Progress Notes (Signed)
Summary: gabapentin refill   Phone Note Refill Request Message from:  Pharmacy on [place] [facility] [address] Valinda Hoar #: [phone]  Refills Requested: Medication #1:  GABAPENTIN 300 MG  CAPS take one tablet three times a day   Dosage confirmed as above?Dosage Confirmed   Supply Requested: 3 months   Last Refilled: 04/06/2009 Initial call taken by: Harold Barban,  May 11, 2009 11:34 AM    Prescriptions: GABAPENTIN 300 MG  CAPS (GABAPENTIN) take one tablet three times a day  #90 x 0   Entered by:   Doristine Devoid   Authorized by:   Neena Rhymes MD   Signed by:   Doristine Devoid on 05/11/2009   Method used:   Electronically to        [facility] (retail)       [address]       [place], [place]  [address]       Ph: [phone]       Fax: [phone]   RxID:   [id number]

## 2010-04-12 NOTE — Miscellaneous (Signed)
Summary: lisinopril  Clinical Lists Changes  Medications: Rx of LISINOPRIL 5 MG TABS (LISINOPRIL) take one tablet daily;  #90 x 1;  Signed;  Entered by: Doristine Devoid;  Authorized by: Neena Rhymes MD;  Method used: Telephoned to [facility] , ,   , Ph: [phone], Fax: [phone]    Prescriptions: LISINOPRIL 5 MG TABS (LISINOPRIL) take one tablet daily  #90 x 1   Entered by:   Doristine Devoid   Authorized by:   Neena Rhymes MD   Signed by:   Doristine Devoid on 05/18/2009   Method used:   Telephoned to ...       [facility] (mail-order)             ,          Ph: [phone]       Fax: [phone]   RxID:   [id number]

## 2010-04-12 NOTE — Progress Notes (Signed)
Summary: Nuclear Pre-Procedure  Phone Note Outgoing Call Call back at Home Phone [phone]   Call placed by: Stanton Kidney, EMT-P,  September 28, 2009 11:53 AM Call placed to: Patient Action Taken: Phone Call Completed Summary of Call: Reviewed information on Myoview Information Sheet (see scanned document for further details).  Spoke with Patient.    Nuclear Med Background Indications for Stress Test: Evaluation for Ischemia, Surgical Clearance  Indications Comments: Pending Back surgery: Dr. Shon Baton       Nuclear Pre-Procedure Cardiac Risk Factors: Hypertension, Lipids, NIDDM Height (in): 68

## 2010-04-12 NOTE — Assessment & Plan Note (Signed)
Summary: Cardiology Nucler Study  Nuclear Med Background Indications for Stress Test: Evaluation for Ischemia, Surgical Clearance  Indications Comments: Pending Back surgery: Dr. Venita Lick   History Comments: NO DOCUMENTED CAD  Symptoms: Rapid HR    Nuclear Pre-Procedure Cardiac Risk Factors: Family History - CAD, Hypertension, Lipids, NIDDM, Obesity Caffeine/Decaff Intake: None NPO After: 6:00 PM Lungs: Clear.  O2 Sat 94% on RA. IV 0.9% NS with Angio Cath: 22g     IV Site: (R) Forearm IV Started by: Irean Hong RN Chest Size (in) 48     Cup Size C     Height (in): 68 Weight (lb): 219 BMI: 33.42  Nuclear Med Study 1 or 2 day study:  1 day     Stress Test Type:  Eugenie Birks Reading MD:  Willa Rough, MD     Referring MD:  Neena Rhymes, MD Resting Radionuclide:  Technetium 54m Tetrofosmin     Resting Radionuclide Dose:  11.0 mCi  Stress Radionuclide:  Technetium 26m Tetrofosmin     Stress Radionuclide Dose:  33.0 mCi   Stress Protocol   Lexiscan: 0.4 mg   Stress Test Technologist:  [employer]a [employer] CMA-N     Nuclear Technologist:  Harlow Asa CNMT  Rest Procedure  Myocardial perfusion imaging was performed at rest 45 minutes following the intravenous administration of Myoview Technetium 37m Tetrofosmin.  Stress Procedure  The patient received IV Lexiscan 0.4 mg over 15-seconds.  Myoview injected at 30-seconds.  There were no significant changes with infusion.  Quantitative spect images were obtained after a 45 minute delay.  QPS Raw Data Images:  Normal; no motion artifact; normal heart/lung ratio. Stress Images:  There is normal uptake in all areas. Rest Images:  Normal homogeneous uptake in all areas of the myocardium. Transient Ischemic Dilatation:  1.05  (Normal <1.22)  Lung/Heart Ratio:  .31  (Normal <0.45)  Quantitative Gated Spect Images QGS EDV:  47 ml QGS ESV:  7 ml QGS EF:  85 % QGS cine images:  Normal motion. The LV cavity is  small  Findings Normal nuclear study      Overall Impression  Exercise Capacity: Lexiscan BP Response: Normal blood pressure response. Clinical Symptoms: SOB ECG Impression: No significant ST segment change suggestive of ischemia. Overall Impression: Normal stress nuclear study.  Appended Document: Cardiology Nucler Study normal study- please notify pt.  Appended Document: Cardiology Nucler Study Patient notified.

## 2010-04-12 NOTE — Progress Notes (Signed)
Summary: Phone about Lab Results  Phone Note Outgoing Call   Call placed by: Kathrynn Speed CMA,  September 27, 2009 9:16 AM Summary of Call: Lft msg for Pt to call office..........................Marland KitchenSRC,CMA, ................09/27/2009..................... Pt called bk aware of lab results..............Marland KitchenSRC,CMA, ..........................09/27/09..................................... Initial call taken by: Kathrynn Speed CMA,  September 27, 2009 9:16 AM

## 2010-04-12 NOTE — Progress Notes (Signed)
  Phone Note From Pharmacy   Summary of Call: Pt moved all meds to [facility].    New/Updated Medications: METFORMIN HCL 500 MG TABS (METFORMIN HCL) 2 by mouth two times a day Prescriptions: SIMVASTATIN 40 MG TABS (SIMVASTATIN) take one tablet daily  #90 x 0   Entered by:   Army Fossa CMA   Authorized by:   Loreen Freud DO   Signed by:   Army Fossa CMA on 05/18/2009   Method used:   Telephoned to ...       [facility] (mail-order)             ,          Ph: [phone]       Fax: [phone]   RxID:   [id number] METFORMIN HCL 500 MG TABS (METFORMIN HCL) 2 by mouth two times a day  #360 x 0   Entered by:   Army Fossa CMA   Authorized by:   Loreen Freud DO   Signed by:   Army Fossa CMA on 05/18/2009   Method used:   Telephoned to ...       [facility] (mail-order)             ,          Ph: [phone]       Fax: [phone]   RxID:   [id number] GLIPIZIDE 10 MG TABS (GLIPIZIDE) take 2 tablets two times a day  #360 x 0   Entered by:   Army Fossa CMA   Authorized by:   Loreen Freud DO   Signed by:   Army Fossa CMA on 05/18/2009   Method used:   Telephoned to ...       [facility]* (mail-order)             ,          Ph: [phone]       Fax: [phone]   RxID:   [id number]

## 2010-04-14 ENCOUNTER — Telehealth: Payer: Self-pay | Admitting: Family Medicine

## 2010-04-14 NOTE — Letter (Signed)
Summary: Pre Visit Letter Revised  [facility]  [address] [place], [place] [address]   Phone: [phone]  Fax: [phone]        03/28/2010 MRN: [id number]  Sandra Ray [address] [place], [place]  [address]             Procedure Date:  05-04-10 10am           Dr Leone Payor - Direct Colon   Welcome to the Gastroenterology Division at [facility].    You are scheduled to see a nurse for your pre-procedure visit on 04-20-10 at 10am on the 3rd floor at [facility], [address]  We ask that you try to arrive at our office 15 minutes prior to your appointment time to allow for check-in.  Please take a minute to review the attached form.  If you answer "Yes" to one or more of the questions on the first page, we ask that you call the person listed at your earliest opportunity.  If you answer "No" to all of the questions, please complete the rest of the form and bring it to your appointment.    Your nurse visit will consist of discussing your medical and surgical history, your immediate family medical history, and your medications.   If you are unable to list all of your medications on the form, please bring the medication bottles to your appointment and we will list them.  We will need to be aware of both prescribed and over the counter drugs.  We will need to know exact dosage information as well.    Please be prepared to read and sign documents such as consent forms, a financial agreement, and acknowledgement forms.  If necessary, and with your consent, a friend or relative is welcome to sit-in on the nurse visit with you.  Please bring your insurance card so that we may make a copy of it.  If your insurance requires a referral to see a specialist, please bring your referral form from your primary care physician.  No co-pay is required for this nurse visit.     If you cannot keep your appointment, please call [phone] to cancel or reschedule prior to  your appointment date.  This allows [place] the opportunity to schedule an appointment for another patient in need of care.    Thank you for choosing [facility] for your medical needs.  We appreciate the opportunity to care for you.  Please visit [place] at our website  to learn more about our practice.  Sincerely, The Gastroenterology Division

## 2010-04-14 NOTE — Progress Notes (Signed)
Summary: Refills  Phone Note Refill Request Call back at [phone]- [phone] Sandra Ray Message from:  Pharmacy on [facility]  Refills Requested: Medication #1:  GLIPIZIDE 10 MG TABS take 2 tablets two times a day   Supply Requested: 3 months  Medication #2:  KLOR-CON M20 20 MEQ CR-TABS take one tablet daily   Supply Requested: 3 months  Medication #3:  HYDROCHLOROTHIAZIDE 25 MG TABS take one tablet daily   Supply Requested: 3 months  Medication #4:  GABAPENTIN 300 MG  CAPS take one tablet three times a day   Supply Requested: 3 months Simvastatin, lisinopril, metformin. 3 month supply for all med with a additional 2 week supply sent to local pharmacy since Pt is running low on med. Local pharmacy not indicated on VM from [facility] pharmacist. Fax also sent to office in regard to refills.Marland KitchenMarland KitchenMarland KitchenFelecia Deloach CMA  February 24, 2010 2:25 PM      Prescriptions: METFORMIN HCL 500 MG TABS (METFORMIN HCL) 2 by mouth two times a day  #56 x 0   Entered by:   Doristine Devoid CMA   Authorized by:   Neena Rhymes MD   Signed by:   Doristine Devoid CMA on 02/25/2010   Method used:   Electronically to        [facility] (retail)       [address]       [place], [place]  [address]       Ph: [phone]       Fax: [phone]   RxID:   [id number] GABAPENTIN 300 MG  CAPS (GABAPENTIN) take one tablet three times a day  #21 x 0   Entered by:   Doristine Devoid CMA   Authorized by:   Neena Rhymes MD   Signed by:   Doristine Devoid CMA on 02/25/2010   Method used:   Electronically to        [facility] (retail)       [address]       [place], [place]  [address]       Ph: [phone]       Fax: [phone]   RxID:   [id number] LISINOPRIL 5 MG TABS (LISINOPRIL) take one tablet daily  #14 x 0   Entered by:   Doristine Devoid CMA   Authorized by:   Neena Rhymes MD   Signed by:   Doristine Devoid CMA on 02/25/2010   Method used:    Electronically to        [facility] (retail)       [address]       [place], [place]  [address]       Ph: [phone]       Fax: [phone]   RxID:   [id number] HYDROCHLOROTHIAZIDE 25 MG TABS (HYDROCHLOROTHIAZIDE) take one tablet daily  #14 x 0   Entered by:   Doristine Devoid CMA   Authorized by:   Neena Rhymes MD   Signed by:   Doristine Devoid CMA on 02/25/2010   Method used:   Electronically to        [facility] (retail)       [address]       [place], [place]  [address]  Ph: [phone]       Fax: [phone]   RxID:   [id number] SIMVASTATIN 40 MG TABS (SIMVASTATIN) take one tablet daily  #14 x 0   Entered by:   Doristine Devoid CMA   Authorized by:   Neena Rhymes MD   Signed by:   Doristine Devoid CMA on 02/25/2010   Method used:   Electronically to        [facility] (retail)       [address]       [place], [place]  [address]       Ph: [phone]       Fax: [phone]   RxID:   [id number] KLOR-CON M20 20 MEQ CR-TABS (POTASSIUM CHLORIDE CRYS CR) take one tablet daily  #14 x 0   Entered by:   Doristine Devoid CMA   Authorized by:   Neena Rhymes MD   Signed by:   Doristine Devoid CMA on 02/25/2010   Method used:   Electronically to        [facility] (retail)       [address]       [place], [place]  [address]       Ph: [phone]       Fax: [phone]   RxID:   [id number] GLIPIZIDE 10 MG TABS (GLIPIZIDE) take 2 tablets two times a day  #56 x 0   Entered by:   Doristine Devoid CMA   Authorized by:   Neena Rhymes MD   Signed by:   Doristine Devoid CMA on 02/25/2010   Method used:   Electronically to        [facility] (retail)       [address]       [place], [place]  [address]       Ph: [phone]       Fax: [phone]   RxID:   [id number] METFORMIN HCL 500 MG  TABS (METFORMIN HCL) 2 by mouth two times a day  #360 x 0   Entered by:   Doristine Devoid CMA   Authorized by:   Neena Rhymes MD   Signed by:   Doristine Devoid CMA on 02/25/2010   Method used:   Faxed to ...       [facility] (mail-order)             , [place]         Ph: [phone]       Fax: [phone]   RxID:   [id number] GABAPENTIN 300 MG  CAPS (GABAPENTIN) take one tablet three times a day  #360 Capsule x 0   Entered by:   Doristine Devoid CMA   Authorized by:   Neena Rhymes MD   Signed by:   Doristine Devoid CMA on 02/25/2010   Method used:   Faxed to ...       [facility] (mail-order)             , [place]         Ph: [phone]       Fax: [phone]   RxID:   [id number] LISINOPRIL 5 MG TABS (LISINOPRIL) take one tablet daily  #90 Tablet x 0   Entered by:   Doristine Devoid CMA   Authorized by:   Neena Rhymes MD  Signed by:   Doristine Devoid CMA on 02/25/2010   Method used:   Faxed to ...       [facility] (mail-order)             , [place]         Ph: [phone]       Fax: [phone]   RxID:   [id number] HYDROCHLOROTHIAZIDE 25 MG TABS (HYDROCHLOROTHIAZIDE) take one tablet daily  #90 x 0   Entered by:   Doristine Devoid CMA   Authorized by:   Neena Rhymes MD   Signed by:   Doristine Devoid CMA on 02/25/2010   Method used:   Faxed to ...       [facility] (mail-order)             , [place]         Ph: [phone]       Fax: [phone]   RxID:   [id number] SIMVASTATIN 40 MG TABS (SIMVASTATIN) take one tablet daily  #90 x 0   Entered by:   Doristine Devoid CMA   Authorized by:   Neena Rhymes MD   Signed by:   Doristine Devoid CMA on 02/25/2010   Method used:   Faxed to ...       [facility] (mail-order)             , [place]         Ph: [phone]       Fax: [phone]   RxID:   [id number] KLOR-CON M20 20 MEQ CR-TABS (POTASSIUM CHLORIDE CRYS CR) take one tablet daily  #90 x 0   Entered by:   Doristine Devoid CMA   Authorized by:   Neena Rhymes MD   Signed by:   Doristine Devoid CMA on 02/25/2010   Method used:   Faxed to ...       [facility] (mail-order)             , [place]         Ph: [phone]       Fax: [phone]   RxID:   [id number] GLIPIZIDE 10 MG TABS (GLIPIZIDE) take 2 tablets two times a day  #360 x 0   Entered by:   Doristine Devoid CMA   Authorized by:   Neena Rhymes MD   Signed by:   Doristine Devoid CMA on 02/25/2010   Method used:   Faxed to ...       [facility] (mail-order)             , [place]         Ph: [phone]       Fax: [phone]   RxID:   [id number]

## 2010-04-14 NOTE — Assessment & Plan Note (Signed)
Summary: cpx//pt will be fasting//lch   Vital Signs:  Patient profile:   65 year old female Menstrual status:  hysterectomy Height:      69 inches Weight:      218.6 pounds Pulse rate:   72 / minute Pulse rhythm:   regular BP sitting:   122 / 70  (right arm) Cuff size:   large  Vitals Entered By: Almeta Monas CMA Duncan Dull) (March 28, 2010 8:38 AM) CC: CPX/FASTING--no pap     Menstrual Status hysterectomy   History of Present Illness: Pt here for cpe and labs.   No pap.   Pt with no complaints.    Pt sees Dr Courtney Paris for her eye exams---last one 2009.  Ortho --Dr Shon Baton ,  [place] Type 1 diabetes mellitus follow-up      This is a 65 year old woman who presents with Type 2 diabetes mellitus follow-up.  The patient denies polyuria, polydipsia, blurred vision, self managed hypoglycemia, hypoglycemia requiring help, weight loss, weight gain, and numbness of extremities.  The patient denies the following symptoms: neuropathic pain, chest pain, vomiting, orthostatic symptoms, poor wound healing, intermittent claudication, vision loss, and foot ulcer.  Since the last visit the patient reports good dietary compliance, compliance with medications, exercising regularly, and monitoring blood glucose.  The patient has been measuring capillary blood glucose before breakfast.  Since the last visit, the patient reports having had eye care by an ophthalmologist and no foot care.    Hyperlipidemia follow-up      The patient also presents for Hyperlipidemia follow-up.  The patient denies muscle aches, GI upset, abdominal pain, flushing, itching, constipation, diarrhea, and fatigue.  The patient denies the following symptoms: chest pain/pressure, exercise intolerance, dypsnea, palpitations, syncope, and pedal edema.  Compliance with medications (by patient report) has been near 100%.  Dietary compliance has been good.  The patient reports exercising 3-4X per week.  Adjunctive measures currently used by the  patient include ASA and weight reduction.    Hypertension follow-up      The patient also presents for Hypertension follow-up.  The patient denies lightheadedness, urinary frequency, headaches, edema, impotence, rash, and fatigue.  The patient denies the following associated symptoms: chest pain, chest pressure, exercise intolerance, dyspnea, palpitations, syncope, leg edema, and pedal edema.  Compliance with medications (by patient report) has been near 100%.  The patient reports that dietary compliance has been good.  The patient reports exercising 3-4X per week.  Adjunctive measures currently used by the patient include salt restriction.    Preventive Screening-Counseling & Management  Alcohol-Tobacco     Alcohol drinks/day: 0     Smoking Status: never  Caffeine-Diet-Exercise     Caffeine use/day: 0     Does Patient Exercise: yes     Type of exercise: walking, bands     Exercise (avg: min/session): <30     Times/week: 4     Exercise Counseling: not indicated; exercise is adequate  Hep-HIV-STD-Contraception     Dental Visit-last 6 months no     Dental Care Counseling: to seek dental care; no dental care within six months     SBE monthly: yes     SBE Education/Counseling: not indicated; SBE done regularly      Sexual History:  currently monogamous.    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 Take 2 Tablets Two Times A Day 2)  Klor-Con M20 20 Meq Cr-Tabs (Potassium Chloride Crys Cr) .... Take One Tablet Daily 3)  Simvastatin 40 Mg Tabs (Simvastatin) .... Take One Tablet Daily 4)  Hydrochlorothiazide 25 Mg Tabs (Hydrochlorothiazide) .... Take One Tablet Daily 5)  Lisinopril 5 Mg Tabs (Lisinopril) .... Take One Tablet Daily 6)  Methocarbamol 500 Mg Tabs (Methocarbamol) .... Take One Tablet Daily As Needed For Back 7)  Tramadol Hcl 50 Mg Tabs (Tramadol Hcl) .... Take One Tablet Daily As Needed 8)  Fluconazole 150 Mg Tabs (Fluconazole) .Marland Kitchen.. 1 Tab By Mouth X1 Dose, May Repeat in 72 Hours If Sxs Persist 9)  Septra Ds 800-160 Mg Tabs (Sulfamethoxazole-Trimethoprim) .... Take One Tablet Daily 10)  Gabapentin 300 Mg  Caps (Gabapentin) .... Take One Tablet Three Times A Day 11)  Metformin Hcl 500 Mg Tabs (Metformin Hcl) .... 2 By Mouth Two Times A Day 12)  Tylenol Arthritis Pain 650 Mg Cr-Tabs (Acetaminophen) 13)  Vitamin C 500 Mg .... Daily 14)  Vitamin E 400 Units .... Daily 15)  One-A-Day Extras Antioxidant  Caps (Multiple Vitamins-Minerals) 16)  Aspirin 81 Mg Chew (Aspirin) .... Daily 17)  Tylenol Pm Extra Strength 500-25 Mg Tabs (Diphenhydramine-Apap (Sleep)) 18)  Fish Oil  Oil (Fish Oil) .... Take Daily 19)  Antara 130 Mg Caps (Fenofibrate Micronized) .Marland Kitchen.. 1 By Mouth Daily. ** Labs Are Due Now_call For An Appt**  Current Medications (verified): 1)  Glipizide 10 Mg Tabs (Glipizide) .... Take 2 Tablets Two Times A Day 2)  Klor-Con M20 20 Meq Cr-Tabs (Potassium Chloride Crys Cr) .... Take One Tablet Daily 3)  Simvastatin 40 Mg Tabs (Simvastatin) .... Take One Tablet Daily 4)  Hydrochlorothiazide 25 Mg Tabs (Hydrochlorothiazide) .... Take One Tablet Daily 5)  Lisinopril 5 Mg Tabs (Lisinopril) .... Take One Tablet Daily 6)  Methocarbamol 500 Mg Tabs (Methocarbamol) .... Take One  Tablet Daily As Needed For Back 7)  Tramadol Hcl 50 Mg Tabs (Tramadol Hcl) .... Take One Tablet Daily As Needed 8)  Fluconazole 150 Mg Tabs (Fluconazole) .Marland Kitchen.. 1 Tab By Mouth X1 Dose, May Repeat in 72 Hours If Sxs Persist 9)  Septra Ds 800-160 Mg Tabs (Sulfamethoxazole-Trimethoprim) .... Take One Tablet Daily 10)  Gabapentin 300 Mg  Caps (Gabapentin) .... Take One Tablet Three Times A Day 11)  Metformin Hcl 500 Mg Tabs (Metformin Hcl) .... 2 By Mouth Two Times A Day 12)  Tylenol Arthritis Pain 650 Mg Cr-Tabs (Acetaminophen) 13)  Vitamin C 500 Mg .... Daily 14)  Vitamin E 400 Units .... Daily 15)  One-A-Day Extras Antioxidant  Caps (Multiple Vitamins-Minerals) 16)  Aspirin 81 Mg Chew (Aspirin) .... Daily 17)  Tylenol Pm Extra Strength 500-25 Mg Tabs (Diphenhydramine-Apap (Sleep)) 18)  Fish Oil  Oil (Fish Oil) .... Take Daily 19)  Antara 130 Mg Caps (Fenofibrate Micronized) .Marland Kitchen.. 1 By Mouth Daily. 20)  Ventolin Hfa 108 (90 Base) Mcg/act Aers (Albuterol Sulfate) .... 2 Puffs Every 6 Hours  21)  Benzonatate 100 Mg Caps (Benzonatate) .Marland Kitchen.. 1 By Mouth Three Times A Day As Needed  Allergies (verified): No Known Drug Allergies  Past History:  Past Medical History: Last updated: 01/31/2008 Diabetes mellitus, type II Hyperlipidemia Hypertension Degenerative Disc Disease  Family History: Last updated: 03/28/2010 CAD-no HTN-father DM-mother STROKE-mother COLON CA-no BREAST CA-maternal aunt, sister Family History Breast cancer 1st degree relative52yo---sister Family History of Stroke F 1st degree relative <60 Family History Diabetes 1st degree relative Family History Hypertension  Social History: Last updated: 01/31/2008 Married Never Smoked Alcohol use-no  Risk Factors: Alcohol Use: 0 (03/28/2010) Caffeine Use: 0 (03/28/2010) Exercise: yes (03/28/2010)  Risk Factors: Smoking Status: never (03/28/2010)  Past Surgical History: Laminectomy-lumbar-- L4,L5, S1 2009,   2010 3  Bulging Disc Caesarean section-x2 Tubal ligation Hysterectomy Lumbar fusion (10/27/2009)  Family History: Reviewed history from 01/31/2008 and no changes required. CAD-no HTN-father DM-mother STROKE-mother COLON CA-no BREAST CA-maternal aunt, sister Family History Breast cancer 1st degree relative52yo---sister Family History of Stroke F 1st degree relative <60 Family History Diabetes 1st degree relative Family History Hypertension  Social History: Reviewed history from 01/31/2008 and no changes required. Married Never Smoked Alcohol use-no Dental Care w/in 6 mos.:  no  Review of Systems      See HPI General:  Denies chills, fatigue, fever, loss of appetite, malaise, sleep disorder, sweats, weakness, and weight loss. Eyes:  Denies blurring, discharge, double vision, eye irritation, eye pain, halos, itching, light sensitivity, red eye, vision loss-1 eye, and vision loss-both eyes. ENT:  Denies decreased hearing, difficulty swallowing, ear discharge, earache, hoarseness, nasal congestion, nosebleeds, postnasal drainage, ringing in ears, sinus pressure, and sore throat. CV:  Denies bluish discoloration of lips or nails, chest pain or discomfort, difficulty breathing at night, difficulty breathing while lying down, fainting, fatigue, leg cramps with exertion, lightheadness, near fainting, palpitations, shortness of breath with exertion, swelling of feet, swelling of hands, and weight gain. Resp:  Denies chest discomfort, chest pain with inspiration, cough, coughing up blood, excessive snoring, hypersomnolence, morning headaches, pleuritic, shortness of breath, sputum productive, and wheezing. GI:  Denies abdominal pain, bloody stools, change in bowel habits, constipation, dark tarry stools, diarrhea, excessive appetite, gas, hemorrhoids, indigestion, loss of appetite, nausea, vomiting, vomiting blood, and yellowish skin color. GU:  Denies abnormal vaginal bleeding, decreased libido,  discharge, dysuria, genital sores, hematuria, incontinence, nocturia, urinary frequency, and urinary hesitancy. MS:  Denies joint pain, joint redness, joint swelling, loss of strength, low back pain, mid back pain, muscle aches, muscle , cramps, muscle weakness, stiffness, and thoracic pain. Derm:  Denies changes in color of skin, changes in nail beds, dryness, excessive perspiration, flushing, hair loss, insect bite(s), itching, lesion(s), poor wound healing, and rash. Psych:  Denies alternate hallucination ( auditory/visual), anxiety, depression, easily angered, easily tearful, irritability, mental problems, panic attacks, sense of great danger, suicidal thoughts/plans, thoughts of violence, unusual visions or sounds, and thoughts /plans of harming others. Endo:  Denies cold intolerance, excessive hunger, excessive thirst, excessive urination, heat intolerance, polyuria, and weight change. Heme:  Denies abnormal bruising, bleeding, enlarge lymph nodes, fevers, pallor, and skin discoloration. Allergy:  Denies hives or rash, itching eyes, persistent infections, seasonal allergies, and sneezing.  Physical Exam  General:  Well-developed,well-nourished,in no acute distress; alert,appropriate and cooperative throughout examination Head:  Normocephalic and atraumatic without obvious abnormalities. No apparent alopecia or balding. Eyes:  pupils equal, pupils round, pupils reactive to light, and no injection.   Ears:  External ear exam shows no significant lesions or  deformities.  Otoscopic examination reveals clear canals, tympanic membranes are intact bilaterally without bulging, retraction, inflammation or discharge. Hearing is grossly normal bilaterally. Nose:  External nasal examination shows no deformity or inflammation. Nasal mucosa are pink and moist without lesions or exudates. Mouth:  Oral mucosa and oropharynx without lesions or exudates.  Teeth in good repair. Neck:  No deformities, masses, or  tenderness noted. Chest Wall:  No deformities, masses, or tenderness noted. Breasts:  No mass, nodules, thickening, tenderness, bulging, retraction, inflamation, nipple discharge or skin changes noted.   Lungs:  Normal respiratory effort, chest expands symmetrically. Lungs are clear to auscultation, no crackles or wheezes. Heart:  normal rate and no murmur.   Abdomen:  Bowel sounds positive,abdomen soft and non-tender without masses, organomegaly or hernias noted. Msk:  normal ROM, no joint tenderness, no joint swelling, no joint warmth, no redness over joints, no joint deformities, no joint instability, and no crepitation.   Pulses:  R and L carotid,radial,femoral,dorsalis pedis and posterior tibial pulses are full and equal bilaterally Extremities:  No clubbing, cyanosis, edema, or deformity noted with normal full range of motion of all joints.   Neurologic:  No cranial nerve deficits noted. Station and gait are normal. Plantar reflexes are down-going bilaterally. DTRs are symmetrical throughout. Sensory, motor and coordinative functions appear intact. Skin:  Intact without suspicious lesions or rashes Cervical Nodes:  No lymphadenopathy noted Psych:  Cognition and judgment appear intact. Alert and cooperative with normal attention span and concentration. No apparent delusions, illusions, hallucinations  Diabetes Management Exam:    Foot Exam (with socks and/or shoes not present):       Sensory-Pinprick/Light touch:          Left medial foot (L-4): diminished          Left dorsal foot (L-5): diminished          Left lateral foot (S-1): diminished          Right medial foot (L-4): diminished          Right dorsal foot (L-5): diminished          Right lateral foot (S-1): diminished       Sensory-Monofilament:          Left foot: diminished          Right foot: diminished       Inspection:          Left foot: normal          Right foot: normal       Nails:          Left foot: thickened           Right foot: normal    Eye Exam:       Eye Exam done elsewhere          Results: normal          Done by: godwin   Impression & Recommendations:  Problem # 1:  PREVENTIVE HEALTH CARE (ICD-V70.0) GHM utd Orders: Venipuncture (04540) TLB-Lipid Panel (80061-LIPID) TLB-BMP (Basic Metabolic Panel-BMET) (80048-METABOL) TLB-CBC Platelet - w/Differential (85025-CBCD) TLB-Hepatic/Liver Function Pnl (80076-HEPATIC) TLB-A1C / Hgb A1C (Glycohemoglobin) (83036-A1C) TLB-Microalbumin/Creat Ratio, Urine (82043-MALB) Specimen Handling (98119) UA Dipstick W/ Micro (manual) (81000) Gastroenterology Referral (GI) Radiology Referral (Radiology) EKG w/ Interpretation (93000)  Problem # 2:  COLONIC POLYPS (ICD-211.3)  Orders: Gastroenterology Referral (GI) EKG w/ Interpretation (93000)  Problem # 3:  NEVI, MULTIPLE (ICD-216.9)  Orders: Dermatology Referral (Derma) EKG w/ Interpretation (93000)  Problem # 4:  MENOPAUSE, SURGICAL (ICD-627.4)  Orders: Radiology Referral (Radiology)  Problem # 5:  HYPOKALEMIA (ICD-276.8)  Orders: Venipuncture (16109) TLB-Lipid Panel (80061-LIPID) TLB-BMP (Basic Metabolic Panel-BMET) (80048-METABOL) TLB-CBC Platelet - w/Differential (85025-CBCD) TLB-Hepatic/Liver Function Pnl (80076-HEPATIC) TLB-A1C / Hgb A1C (Glycohemoglobin) (83036-A1C) TLB-Microalbumin/Creat Ratio, Urine (82043-MALB) EKG w/ Interpretation (93000)  Problem # 6:  HYPERTENSION (ICD-401.9)  Her updated medication list for this problem includes:    Hydrochlorothiazide 25 Mg Tabs (Hydrochlorothiazide) .Marland Kitchen... Take one tablet daily    Lisinopril 5 Mg Tabs (Lisinopril) .Marland Kitchen... Take one tablet daily  Orders: Venipuncture (60454) TLB-Lipid Panel (80061-LIPID) TLB-BMP (Basic Metabolic Panel-BMET) (80048-METABOL) TLB-CBC Platelet - w/Differential (85025-CBCD) TLB-Hepatic/Liver Function Pnl (80076-HEPATIC) TLB-A1C / Hgb A1C (Glycohemoglobin) (83036-A1C) TLB-Microalbumin/Creat Ratio,  Urine (82043-MALB) EKG w/ Interpretation (93000)  BP today: 122/70 Prior BP: 124/82 (09/24/2009)  Labs Reviewed: K+: 4.1 (09/24/2009) Creat: : 0.7 (09/24/2009)   Chol: 165 (09/24/2009)   HDL: 47.00 (09/24/2009)   LDL: 81 (09/09/2008)   TG: 224.0 (09/24/2009)  Problem # 7:  HYPERLIPIDEMIA (ICD-272.4)  Her updated medication list for this problem includes:    Simvastatin 40 Mg Tabs (Simvastatin) .Marland Kitchen... Take one tablet daily    Antara 130 Mg Caps (Fenofibrate micronized) .Marland Kitchen... 1 by mouth daily.  Orders: Venipuncture (09811) TLB-Lipid Panel (80061-LIPID) TLB-BMP (Basic Metabolic Panel-BMET) (80048-METABOL) TLB-CBC Platelet - w/Differential (85025-CBCD) TLB-Hepatic/Liver Function Pnl (80076-HEPATIC) TLB-A1C / Hgb A1C (Glycohemoglobin) (83036-A1C) TLB-Microalbumin/Creat Ratio, Urine (82043-MALB) EKG w/ Interpretation (93000)  Labs Reviewed: SGOT: 31 (09/24/2009)   SGPT: 34 (09/24/2009)   HDL:47.00 (09/24/2009), 52.20 (05/17/2009)  LDL:81 (09/09/2008), 76 (02/21/2008)  Chol:165 (09/24/2009), 146 (05/17/2009)  Trig:224.0 (09/24/2009), 224.0 (05/17/2009)  Problem # 8:  DIABETES MELLITUS, TYPE II (ICD-250.00)  Her updated medication list for this problem includes:    Glipizide 10 Mg Tabs (Glipizide) .Marland Kitchen... Take 2 tablets two times a day    Lisinopril 5 Mg Tabs (Lisinopril) .Marland Kitchen... Take one tablet daily    Metformin Hcl 500 Mg Tabs (Metformin hcl) .Marland Kitchen... 2 by mouth two times a day    Aspirin 81 Mg Chew (Aspirin) .Marland Kitchen... Daily  Orders: Venipuncture (91478) TLB-Lipid Panel (80061-LIPID) TLB-BMP (Basic Metabolic Panel-BMET) (80048-METABOL) TLB-CBC Platelet - w/Differential (85025-CBCD) TLB-Hepatic/Liver Function Pnl (80076-HEPATIC) TLB-A1C / Hgb A1C (Glycohemoglobin) (83036-A1C) TLB-Microalbumin/Creat Ratio, Urine (82043-MALB) EKG w/ Interpretation (93000)  Labs Reviewed: Creat: 0.7 (09/24/2009)    Reviewed HgBA1c results: 7.2 (09/24/2009)  7.0 (05/17/2009)  Complete Medication  List: 1)  Glipizide 10 Mg Tabs (Glipizide) .... Take 2 tablets two times a day 2)  Klor-con M20 20 Meq Cr-tabs (Potassium chloride crys cr) .... Take one tablet daily 3)  Simvastatin 40 Mg Tabs (Simvastatin) .... Take one tablet daily 4)  Hydrochlorothiazide 25 Mg Tabs (Hydrochlorothiazide) .... Take one tablet daily 5)  Lisinopril 5 Mg Tabs (Lisinopril) .... Take one tablet daily 6)  Methocarbamol 500 Mg Tabs (Methocarbamol) .... Take one tablet daily as needed for back 7)  Tramadol Hcl 50 Mg Tabs (Tramadol hcl) .... Take one tablet daily as needed 8)  Fluconazole 150 Mg Tabs (Fluconazole) .Marland Kitchen.. 1 tab by mouth x1 dose, may repeat in 72 hours if sxs persist 9)  Septra Ds 800-160 Mg Tabs (Sulfamethoxazole-trimethoprim) .... Take one tablet daily 10)  Gabapentin 300 Mg Caps (Gabapentin) .... Take one tablet three times a day 11)  Metformin Hcl 500 Mg Tabs (Metformin hcl) .... 2 by mouth two times a day 12)  Tylenol Arthritis Pain 650 Mg Cr-tabs (Acetaminophen) 13)  Vitamin C 500 Mg  .... Daily 14)  Vitamin  E 400 Units  .... Daily 15)  One-a-day Extras Antioxidant Caps (Multiple vitamins-minerals) 16)  Aspirin 81 Mg Chew (Aspirin) .... Daily 17)  Tylenol Pm Extra Strength 500-25 Mg Tabs (Diphenhydramine-apap (sleep)) 18)  Fish Oil Oil (Fish oil) .... Take daily 19)  Antara 130 Mg Caps (Fenofibrate micronized) .Marland Kitchen.. 1 by mouth daily. 20)  Ventolin Hfa 108 (90 Base) Mcg/act Aers (Albuterol sulfate) .... 2 puffs every 6 hours 21)  Benzonatate 100 Mg Caps (Benzonatate) .Marland Kitchen.. 1 by mouth three times a day as needed  Other Orders: Zoster (Shingles) Vaccine Live [phone]) Admin 1st Vaccine (60454)  Patient Instructions: 1)  Please schedule a follow-up appointment in 6 months .  Prescriptions: ANTARA 130 MG CAPS (FENOFIBRATE MICRONIZED) 1 by mouth daily.  #90 x 3   Entered and Authorized by:   Loreen Freud DO   Signed by:   Loreen Freud DO on 03/28/2010   Method used:   Faxed to ...       [facility]  (mail-order)             , [place]         Ph: [phone]       Fax: [phone]   RxID:   [id number] METFORMIN HCL 500 MG TABS (METFORMIN HCL) 2 by mouth two times a day  #360 x 3   Entered and Authorized by:   Loreen Freud DO   Signed by:   Loreen Freud DO on 03/28/2010   Method used:   Faxed to ...       [facility] (mail-order)             , [place]         Ph: [phone]       Fax: [phone]   RxID:   [id number] GABAPENTIN 300 MG  CAPS (GABAPENTIN) take one tablet three times a day  #360 x 3   Entered and Authorized by:   Loreen Freud DO   Signed by:   Loreen Freud DO on 03/28/2010   Method used:   Faxed to ...       [facility] (mail-order)             , [place]         Ph: [phone]       Fax: [phone]   RxID:   [id number] LISINOPRIL 5 MG TABS (LISINOPRIL) take one tablet daily  #90 x 3   Entered and Authorized by:   Loreen Freud DO   Signed by:   Loreen Freud DO on 03/28/2010   Method used:   Faxed to ...       [facility] (mail-order)             , [place]         Ph: [phone]       Fax: [phone]   RxID:   [id number] HYDROCHLOROTHIAZIDE 25 MG TABS (HYDROCHLOROTHIAZIDE) take one tablet daily  #90 x 3   Entered and Authorized by:   Loreen Freud DO   Signed by:   Loreen Freud DO on 03/28/2010   Method used:   Faxed to ...       [facility] (mail-order)             , [place]         Ph: [phone]       Fax: [phone]   RxID:   [id number] SIMVASTATIN 40 MG TABS (SIMVASTATIN) take one tablet daily  #90 x 3  Entered and Authorized by:   Loreen Freud DO   Signed by:   Loreen Freud DO on 03/28/2010   Method used:   Faxed to ...       [facility] (mail-order)             , [place]         Ph: [phone]       Fax: [phone]   RxID:   [id number] KLOR-CON M20 20 MEQ CR-TABS (POTASSIUM CHLORIDE CRYS CR) take one tablet daily  #90 x 3   Entered and Authorized by:   Loreen Freud DO   Signed by:   Loreen Freud DO on 03/28/2010   Method used:   Faxed to ...        [facility] (mail-order)             , [place]         Ph: [phone]       Fax: [phone]   RxID:   [id number] GLIPIZIDE 10 MG TABS (GLIPIZIDE) take 2 tablets two times a day  #360 x 3   Entered and Authorized by:   Loreen Freud DO   Signed by:   Loreen Freud DO on 03/28/2010   Method used:   Faxed to ...       [facility] (mail-order)             , [place]         Ph: [phone]       Fax: [phone]   RxID:   [id number]    Orders Added: 1)  Venipuncture [76160] 2)  TLB-Lipid Panel [80061-LIPID] 3)  TLB-BMP (Basic Metabolic Panel-BMET) [80048-METABOL] 4)  TLB-CBC Platelet - w/Differential [85025-CBCD] 5)  TLB-Hepatic/Liver Function Pnl [80076-HEPATIC] 6)  TLB-A1C / Hgb A1C (Glycohemoglobin) [83036-A1C] 7)  TLB-Microalbumin/Creat Ratio, Urine [82043-MALB] 8)  Zoster (Shingles) Vaccine Live [90736] 9)  Admin 1st Vaccine [90471] 10)  Specimen Handling [99000] 11)  UA Dipstick W/ Micro (manual) [81000] 12)  Gastroenterology Referral [GI] 13)  Dermatology Referral [Derma] 14)  Radiology Referral [Radiology] 15)  Radiology Referral [Radiology] 16)  Est. Patient 40-64 years [99396] 17)  EKG w/ Interpretation [93000]   Immunizations Administered:  Zostavax # 1:    Vaccine Type: Zostavax    Site: right deltoid    Mfr: Merck    Dose: 0.5 ml    Route: [place]    Given by: Almeta Monas CMA (AAMA)    Exp. Date: 01/12/2011    Lot #: [id number]    VIS given: 12/23/04 given March 28, 2010.   Immunizations Administered:  Zostavax # 1:    Vaccine Type: Zostavax    Site: right deltoid    Mfr: Merck    Dose: 0.5 ml    Route: [place]    Given by: Almeta Monas CMA (AAMA)    Exp. Date: 01/12/2011    Lot #: [id number]    VIS given: 12/23/04 given March 28, 2010.  Last Flu Vaccine:  Fluvax 3+ (12/17/2008 9:19:53 AM) Flu Vaccine Result Date:  12/22/2009 Flu Vaccine Result:  given Flu Vaccine Next Due:  1 yr Herpes Zoster Result Date:  03/28/2010 Herpes Zoster Result:   given  Laboratory Results   Urine Tests   Date/Time Reported: March 28, 2010 10:52 AM   Routine Urinalysis   Color: yellow Appearance: Clear Glucose: negative   (Normal Range: Negative) Bilirubin: negative   (Normal Range: Negative) Ketone: negative   (Normal Range: Negative) Spec. Gravity: 1.020   (Normal  Range: 1.003-1.035) Blood: negative   (Normal Range: Negative) pH: 6.5   (Normal Range: 5.0-8.0) Protein: negative   (Normal Range: Negative) Urobilinogen: negative   (Normal Range: 0-1) Nitrite: negative   (Normal Range: Negative) Leukocyte Esterace: negative   (Normal Range: Negative)    Comments: Floydene Flock  March 28, 2010 10:52 AM

## 2010-04-15 NOTE — Letter (Signed)
Summary: Surgical Clearance/[facility]  Surgical Clearance/[facility]   Imported By: Lanelle Bal 09/30/2009 11:32:38  _____________________________________________________________________  External Attachment:    Type:   Image     Comment:   External Document

## 2010-04-20 NOTE — Progress Notes (Signed)
Summary: janumet making her sick  Phone Note Call from Patient Call back at Home Phone [phone]   Caller: Patient Summary of Call: this patient of Dr Beverely Low saw Dr Laury Axon for her last visit---Dr Laury Axon changed her medication to [facility] and patient says she is having terrible problem with nausea   (she purchased a 90 day supply of Janumet)  To help with the nausea, she has been taking her sister's Phenergran 25mg --she was cutting them in half and then cutting each of these ino halves----sometimes she is taking the quarter, sometimes she is taking the half but it is giving her the relief that she needs  What should she do?  uses [facility] [address] Initial call taken by: Jerolyn Shin,  April 14, 2010 11:57 AM  Follow-up for Phone Call        symptoms may subside with time---we can call in rx phenergan 25 mg  #30  1 by mouth qid as needed ----otherwise we would need to change the med. Follow-up by: Loreen Freud DO,  April 14, 2010 12:20 PM  Additional Follow-up for Phone Call Additional follow up Details #1::        pt aware of the above and she voiced understanding, ststae dhs eis uing some phenergan her sister gave her but would like to have Rx put on hold..... Rx [facility] to [facility] Additional Follow-up by: Almeta Monas CMA (AAMA),  April 14, 2010 1:32 PM    New/Updated Medications: PROMETHAZINE HCL 25 MG TABS (PROMETHAZINE HCL) 1 by mouth qid as needed Prescriptions: PROMETHAZINE HCL 25 MG TABS (PROMETHAZINE HCL) 1 by mouth qid as needed  #30 x 0   Entered by:   Almeta Monas CMA (AAMA)   Authorized by:   Loreen Freud DO   Signed by:   Almeta Monas CMA (AAMA) on 04/14/2010   Method used:   Faxed to ...       [facility]       [address]       [place], [place]  [address]       Ph: [phone]       Fax: [phone]   RxID:   [id number]

## 2010-04-25 ENCOUNTER — Ambulatory Visit
Admission: RE | Admit: 2010-04-25 | Discharge: 2010-04-25 | Disposition: A | Discharge status: Home / Self Care | Payer: BC Managed Care – PPO | Source: Ambulatory Visit | Attending: Family Medicine | Admitting: Family Medicine

## 2010-04-25 DIAGNOSIS — Z1239 Encounter for other screening for malignant neoplasm of breast: Secondary | ICD-10-CM

## 2010-05-04 ENCOUNTER — Other Ambulatory Visit: Payer: Self-pay | Admitting: Internal Medicine

## 2010-05-11 ENCOUNTER — Other Ambulatory Visit: Payer: Self-pay | Admitting: Dermatology

## 2010-05-27 LAB — POCT I-STAT 4, (NA,K, GLUC, HGB,HCT)
Glucose, Bld: 139 mg/dL — ABNORMAL HIGH (ref 70–99)
HCT: 30 % — ABNORMAL LOW (ref 36.0–46.0)
Hemoglobin: 10.2 g/dL — ABNORMAL LOW (ref 12.0–15.0)
Potassium: 3.6 mEq/L (ref 3.5–5.1)
Sodium: 137 mEq/L (ref 135–145)

## 2010-05-27 LAB — TYPE AND SCREEN
Antibody Screen: POSITIVE
Donor AG Type: NEGATIVE
PT AG Type: NEGATIVE

## 2010-05-27 LAB — BASIC METABOLIC PANEL
BUN: 16 mg/dL (ref 6–23)
CO2: 27 mEq/L (ref 19–32)
Chloride: 101 mEq/L (ref 96–112)
Creatinine, Ser: 0.8 mg/dL (ref 0.4–1.2)
Glucose, Bld: 138 mg/dL — ABNORMAL HIGH (ref 70–99)
Potassium: 3.9 mEq/L (ref 3.5–5.1)

## 2010-05-27 LAB — URINE CULTURE
Culture  Setup Time: 201108201134
Special Requests: NEGATIVE

## 2010-05-27 LAB — CBC
HCT: 29.4 % — ABNORMAL LOW (ref 36.0–46.0)
HCT: 40.8 % (ref 36.0–46.0)
Hemoglobin: 8.9 g/dL — ABNORMAL LOW (ref 12.0–15.0)
Hemoglobin: 9.8 g/dL — ABNORMAL LOW (ref 12.0–15.0)
MCH: 30.3 pg (ref 26.0–34.0)
MCH: 30.8 pg (ref 26.0–34.0)
MCHC: 33.3 g/dL (ref 30.0–36.0)
MCHC: 34.3 g/dL (ref 30.0–36.0)
MCV: 90.7 fL (ref 78.0–100.0)
MCV: 92.2 fL (ref 78.0–100.0)
MCV: 89.9 fL (ref 78.0–100.0)
Platelets: 231 10*3/uL (ref 150–400)
Platelets: 335 10*3/uL (ref 150–400)
RBC: 2.94 MIL/uL — ABNORMAL LOW (ref 3.87–5.11)
RBC: 3.44 MIL/uL — ABNORMAL LOW (ref 3.87–5.11)
RDW: 13.6 % (ref 11.5–15.5)
RDW: 13.5 % (ref 11.5–15.5)
WBC: 13.5 10*3/uL — ABNORMAL HIGH (ref 4.0–10.5)
WBC: 15.4 10*3/uL — ABNORMAL HIGH (ref 4.0–10.5)
WBC: 16.7 10*3/uL — ABNORMAL HIGH (ref 4.0–10.5)
WBC: 10.6 10*3/uL — ABNORMAL HIGH (ref 4.0–10.5)

## 2010-05-27 LAB — POCT I-STAT GLUCOSE: Operator id: 150791

## 2010-05-27 LAB — GLUCOSE, CAPILLARY
Glucose-Capillary: 134 mg/dL — ABNORMAL HIGH (ref 70–99)
Glucose-Capillary: 136 mg/dL — ABNORMAL HIGH (ref 70–99)
Glucose-Capillary: 146 mg/dL — ABNORMAL HIGH (ref 70–99)
Glucose-Capillary: 167 mg/dL — ABNORMAL HIGH (ref 70–99)
Glucose-Capillary: 174 mg/dL — ABNORMAL HIGH (ref 70–99)
Glucose-Capillary: 184 mg/dL — ABNORMAL HIGH (ref 70–99)
Glucose-Capillary: 249 mg/dL — ABNORMAL HIGH (ref 70–99)

## 2010-05-27 LAB — URINALYSIS, ROUTINE W REFLEX MICROSCOPIC
Bilirubin Urine: NEGATIVE
Glucose, UA: NEGATIVE mg/dL
Ketones, ur: 15 mg/dL — AB
pH: 5 (ref 5.0–8.0)

## 2010-05-27 LAB — URINE MICROSCOPIC-ADD ON

## 2010-05-27 LAB — APTT: aPTT: 25 seconds (ref 24–37)

## 2010-05-27 LAB — PROTIME-INR
INR: 1.19 (ref 0.00–1.49)
Prothrombin Time: 15.3 seconds — ABNORMAL HIGH (ref 11.6–15.2)

## 2010-05-27 LAB — HEMOGLOBIN A1C
Hgb A1c MFr Bld: 6.9 % — ABNORMAL HIGH (ref <5.7)
Mean Plasma Glucose: 151 mg/dL — ABNORMAL HIGH (ref <117)

## 2010-05-27 LAB — HEMOGLOBIN AND HEMATOCRIT, BLOOD: HCT: 31.5 % — ABNORMAL LOW (ref 36.0–46.0)

## 2010-05-30 ENCOUNTER — Telehealth: Payer: Self-pay | Admitting: Family Medicine

## 2010-06-09 NOTE — Progress Notes (Signed)
Summary: Refill-Promethazine  Phone Note Refill Request Call back at Home Phone [phone] Message from:  Patient on May 30, 2010 11:31 AM  Refills Requested: Medication #1:  PROMETHAZINE HCL 25 MG TABS 1 by mouth qid as needed. [facility] [address]  Initial call taken by: Okey Regal [place],  May 30, 2010 11:35 AM  Follow-up for Phone Call        Last CPX was done by [facility], please advise. Lucious Groves CMA  May 30, 2010 1:07 PM   Additional Follow-up for Phone Call Additional follow up Details #1::        ok for #30, will need appt in April to discuss DM and meds Additional Follow-up by: Neena Rhymes MD,  May 30, 2010 1:10 PM    Additional Follow-up for Phone Call Additional follow up Details #2::    Patient notified. Lucious Groves CMA  May 30, 2010 1:37 PM   Prescriptions: PROMETHAZINE HCL 25 MG TABS (PROMETHAZINE HCL) 1 by mouth qid as needed  #30 x 0   Entered by:   Lucious Groves CMA   Authorized by:   Neena Rhymes MD   Signed by:   Lucious Groves CMA on 05/30/2010   Method used:   Printed then faxed to ...       [facility]       [address]       [place], [place]  [address]       Ph: [phone]       Fax: [phone]   RxID:   [id number]

## 2010-06-16 LAB — GLUCOSE, CAPILLARY
Glucose-Capillary: 115 mg/dL — ABNORMAL HIGH (ref 70–99)
Glucose-Capillary: 139 mg/dL — ABNORMAL HIGH (ref 70–99)
Glucose-Capillary: 146 mg/dL — ABNORMAL HIGH (ref 70–99)
Glucose-Capillary: 147 mg/dL — ABNORMAL HIGH (ref 70–99)
Glucose-Capillary: 160 mg/dL — ABNORMAL HIGH (ref 70–99)
Glucose-Capillary: 95 mg/dL (ref 70–99)

## 2010-06-16 LAB — URINE MICROSCOPIC-ADD ON

## 2010-06-16 LAB — COMPREHENSIVE METABOLIC PANEL
ALT: 29 U/L (ref 0–35)
AST: 26 U/L (ref 0–37)
Alkaline Phosphatase: 55 U/L (ref 39–117)
CO2: 32 mEq/L (ref 19–32)
Chloride: 98 mEq/L (ref 96–112)
GFR calc non Af Amer: >60 mL/min (ref >60)
Potassium: 3.7 mEq/L (ref 3.5–5.1)
Sodium: 139 mEq/L (ref 135–145)
Total Bilirubin: 0.7 mg/dL (ref 0.3–1.2)

## 2010-06-16 LAB — URINALYSIS, ROUTINE W REFLEX MICROSCOPIC
Bilirubin Urine: NEGATIVE
Glucose, UA: NEGATIVE mg/dL
Ketones, ur: NEGATIVE mg/dL
pH: 5.5 (ref 5.0–8.0)

## 2010-06-16 LAB — CBC
HCT: 41.1 % (ref 36.0–46.0)
Hemoglobin: 14 g/dL (ref 12.0–15.0)
MCHC: 34.1 g/dL (ref 30.0–36.0)
Platelets: 281 10*3/uL (ref 150–400)
RDW: 14 % (ref 11.5–15.5)

## 2010-06-21 ENCOUNTER — Other Ambulatory Visit: Payer: Self-pay | Admitting: Family Medicine

## 2010-06-23 ENCOUNTER — Other Ambulatory Visit: Payer: Self-pay | Admitting: Family Medicine

## 2010-06-23 ENCOUNTER — Telehealth: Payer: Self-pay | Admitting: Family Medicine

## 2010-06-23 NOTE — Telephone Encounter (Signed)
Patient says that "lots of prescriptions" were ordered for her  Through [facility], but patient says she made an error---prescription for Antara should have gone to [place], [place], [place]---Not to [employer] ---please call in three month supply prescription for Antara to [facility]  (the prescription from

## 2010-06-28 ENCOUNTER — Encounter: Payer: Self-pay | Admitting: Family Medicine

## 2010-06-29 ENCOUNTER — Other Ambulatory Visit (INDEPENDENT_AMBULATORY_CARE_PROVIDER_SITE_OTHER): Payer: BC Managed Care – PPO

## 2010-06-29 DIAGNOSIS — E119 Type 2 diabetes mellitus without complications: Secondary | ICD-10-CM

## 2010-06-29 DIAGNOSIS — E785 Hyperlipidemia, unspecified: Secondary | ICD-10-CM

## 2010-06-29 LAB — BASIC METABOLIC PANEL
BUN: 25 mg/dL — ABNORMAL HIGH (ref 6–23)
Calcium: 9.7 mg/dL (ref 8.4–10.5)
GFR: 87.83 mL/min (ref >60.00)
Glucose, Bld: 80 mg/dL (ref 70–99)

## 2010-06-29 LAB — HEPATIC FUNCTION PANEL
AST: 22 U/L (ref 0–37)
Alkaline Phosphatase: 44 U/L (ref 39–117)
Total Bilirubin: 0.4 mg/dL (ref 0.3–1.2)

## 2010-06-29 LAB — LIPID PANEL
LDL Cholesterol: 84 mg/dL (ref 0–99)
Total CHOL/HDL Ratio: 4

## 2010-06-30 ENCOUNTER — Encounter: Payer: Self-pay | Admitting: *Deleted

## 2010-07-05 ENCOUNTER — Ambulatory Visit (INDEPENDENT_AMBULATORY_CARE_PROVIDER_SITE_OTHER): Payer: BC Managed Care – PPO | Admitting: Family Medicine

## 2010-07-05 ENCOUNTER — Encounter: Payer: Self-pay | Admitting: Family Medicine

## 2010-07-05 DIAGNOSIS — I10 Essential (primary) hypertension: Secondary | ICD-10-CM

## 2010-07-05 DIAGNOSIS — E119 Type 2 diabetes mellitus without complications: Secondary | ICD-10-CM

## 2010-07-05 DIAGNOSIS — E785 Hyperlipidemia, unspecified: Secondary | ICD-10-CM

## 2010-07-05 NOTE — Progress Notes (Signed)
  Subjective:    Patient ID: Sandra Ray, female    DOB: 1945/12/11, 65 y.o.   MRN: [id number]  HPI HTN- adequate control, not as good as usual.  Chronic problem for pt.  On Lisinopril, HCTZ.  Reports increased stress recently b/c mom is at end stages of Alzheimer's.  No CP, SOB, HAs, visual changes, edema.  DM- A1C has improved (see labs).  S/p back surgery is now pain free and able to walk/exercise.  Admits to some dietary 'cheats'.  On Janumet and glipizide.  Will have some nausea w/ metformin and will take 1/2 tab phenergan to improve sxs.  Hyperlipidemia-  Recent labs show better control.  On Lipitor and Fenofibrate.  Aware that she needs to work on diet and increase her exercise.  No abd pain, myalgias.   Review of Systems For ROS see HPI     Objective:   Physical Exam  Constitutional: She is oriented to person, place, and time. She appears well-developed and well-nourished. No distress.  HENT:  Head: Normocephalic and atraumatic.  Eyes: Conjunctivae and EOM are normal. Pupils are equal, round, and reactive to light.  Neck: Normal range of motion. Neck supple. No thyromegaly present.  Cardiovascular: Normal rate, regular rhythm, normal heart sounds and intact distal pulses.   No murmur heard. Pulmonary/Chest: Effort normal and breath sounds normal. No respiratory distress.  Abdominal: Soft. She exhibits no distension. There is no tenderness.  Musculoskeletal: She exhibits no edema.  Lymphadenopathy:    She has no cervical adenopathy.  Neurological: She is alert and oriented to person, place, and time.  Skin: Skin is warm and dry.  Psychiatric: She has a normal mood and affect. Her behavior is normal.          Assessment & Plan:

## 2010-07-05 NOTE — Assessment & Plan Note (Signed)
Reviewed lipid results w/ pt.  Encouraged continued attention to diet and exercise.  No med changes at this time.  Will follow.

## 2010-07-05 NOTE — Patient Instructions (Signed)
Follow up in 3 months to recheck diabetes Keep up the good work on diet and exercise!!  You look great! Call with any questions or concerns [place]!!!

## 2010-07-05 NOTE — Assessment & Plan Note (Signed)
Pt's A1C continues to improve.  Applauded her efforts.  Has lost 30 lbs since stopping actos.  No med changes at this time.

## 2010-07-05 NOTE — Assessment & Plan Note (Signed)
Not as well controlled today but pt admits to increased stress recently.  Asymptomatic.  Will continue to follow closely.  No changes at this time.

## 2010-07-26 NOTE — Consult Note (Signed)
NEW PATIENT CONSULTATION   Awtrey, Maghen E  DOB:  1945/07/28                                       10/21/2009  ZOXWR#:[id number]   I saw the patient in the office today in consultation for evaluation for  possible anterior exposure for an ALIF procedure by Dr. Shon Baton.  This is  a pleasant 65 year old woman who states that she has had problems with  back pain since she was 65 years old.  She has had two previous  operations on her back and is continuing to have back pain.  She has  been evaluated by Dr. Shon Baton and is being considered for anterior lumbar  L4-L5, L5-S1 instrumented fusion.  Of note her back pain she describes  as moderately severe and constant in nature.  It is alleviated somewhat  with leg elevation and aggravated by standing and walking.  There are no  associated symptoms except for some radiation down the posterior aspect  of her right leg.   Her past medical history is significant for adult onset diabetes,  hypertension and hypercholesterolemia.  She denies any history of  previous myocardial infarction, history of congestive heart failure,  history of COPD.   PAST SURGICAL HISTORY:  Significant for two previous C-sections in  addition to tubal ligation and hysterectomy.  She has also had the two  previous back operations as described.   SOCIAL HISTORY:  She is married.  She has two children.  She does not  smoke cigarettes.   FAMILY HISTORY:  There is no history of premature cardiovascular  disease.   REVIEW OF SYSTEMS:  GENERAL:  She has had no recent weight loss, weight  gain or problems with her appetite.  She is 5 feet 8 inches tall, 221  pounds.  CARDIOVASCULAR:  She has had no chest pain, chest pressure, palpitations  or arrhythmias.  She has had no claudication, rest pain or nonhealing  ulcers.  She has had no history of stroke, TIAs or amaurosis fugax.  She  has had no history of DVT or phlebitis.  MUSCULOSKELETAL:  She does have  a history of arthritis and joint pain.  GI, neurologic, pulmonary, hematologic, GU, ENT, psychiatric,  integumentary review of systems is unremarkable and is documented on the  medical history form in her chart.   PHYSICAL EXAMINATION:  General:  This is a pleasant 65 year old woman  who appears her stated age.  Vital signs:  Her blood pressure is 140/83,  heart rate is 93, saturation 98%.  HEENT:  Unremarkable.  Lungs:  Are  clear bilaterally to auscultation without rales, rhonchi or wheezing.  Cardiovascular:  I do not detect any carotid bruits.  She has a regular  rate and rhythm.  She has palpable femoral and popliteal pulses  bilaterally.  She has a diminished but palpable dorsalis pedis pulse on  the right.  I cannot palpate a posterior tibial pulse on the right.  I  cannot palpate a dorsalis pedis or posterior tibial pulse on the left.  Abdomen:  Obese.  It is nontender.  She has normal pitched bowel sounds.  No masses are appreciated.  She has healed lower midline incision.  Musculoskeletal:  There are no major deformities or cyanosis.  Neurological:  She has no focal weakness or paresthesias.  Skin:  There  are no ulcers or rashes.  I did independently perform a Doppler study today in the office which  shows a biphasic posterior tibial signal bilaterally with a monophasic  dorsalis pedis signal on the left and a brisk monophasic dorsalis pedis  signal on the right.   I have reviewed her MRI of the spine which at the L4-L5 level shows  broad based central disk protrusions extending into both neural  foramina.  There is bilateral L5 nerve root encroachment.  At L5-S1  there is central and leftward disk protrusion, no central canal  stenosis.  Of note, I also reviewed her plain x-rays of her back from  October of 2010 which show a markedly calcified infrarenal aorta.   I had a long discussion with the patient and her husband about exposure  of the disk for ALIF.  We have  explained how this requires extensive  mobilization of the iliac arteries, aorta and iliac veins.  For several  reasons I think she would be at fairly high risk for anterior exposure  for ALIF.  Most importantly she has a markedly calcified aorta seen on  her plain film previously and this puts her at significant risk for  injury to the aorta with the traction that is required especially for L4-  L5 exposure.  I have explained to her that 90% of vascular injuries are  with the L4-L5 exposure.  In addition, the patient is obese and this  certainly complicates the situation further.  In addition, the situation  is further complicated by her previous surgery although I think this is  less of an issue.  I have left a message with Dr. Gary Fleet office  concerning this consultation.  Fortunately in reviewing his note it  sounds like she might be a candidate for revision posterior interbody  fusion and instrumentation or possibly a spinal cord stimulator.  We  will be happy to see her back at any time if any new vascular issues  arise.     Di Kindle. Edilia Bo, M.D.  Electronically Signed   CSD/MEDQ  D:  10/21/2009  T:  10/22/2009  Job:  [id number]   cc:   Alvy Beal, MD

## 2010-07-26 NOTE — Op Note (Signed)
NAMEDAWSON, HOLLMAN NO.:  [id number]   MEDICAL RECORD NO.:  [id number]          PATIENT TYPE:  AMB   LOCATION:  DAY                          FACILITY[facility]  [facility]   PHYSICIAN:  Jene Every, M.D.    DATE OF BIRTH:  06-18-1945   DATE OF PROCEDURE:  09/19/2007  DATE OF DISCHARGE:                               OPERATIVE REPORT   PREOPERATIVE DIAGNOSIS:  Spinal stenosis at 3-4, 4-5.   POSTOPERATIVE DIAGNOSIS:  Spinal stenosis at 3-4, 4-5, 2-3.   PROCEDURE PERFORMED:  Lumbar decompression L3-4, L4-5, and L2-3 by  central laminectomies of 3 and 4 and bilateral hemilaminotomies and  foraminotomies of L5-L4.   ANESTHESIA:  General.   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  She was indicated for decompression,  foraminotomies, and evaluation of the disk.  Risks and benefits  discussed including bleeding, infection, damage to vascular structures,  CSF leakage, epidural fibrosis, adjacent secondary disease, need for  fusion in the future, anesthetic complications, etc.   TECHNIQUE:  The patient in supine position.  After induction of adequate  general anesthesia, 3 g of Kefzol, she was placed prone on the [place]  frame.  All bony prominences were well-padded in the 90/90 position,  popliteal and in the hip.  Lumbar region prepped and draped in the usual  sterile fashion.  Two 18 gauge spinal needles were utilized to localize  the 3-4, 4-5 interspace, confirmed with x-ray.  I made an incision from  above the spinous process of 3 to below 5.  Subcutaneous tissue was  dissected.  Electrocautery was utilized to achieve hemostasis.  Dorsolumbar fascia identified and divided in line with the  skin  incision.  Paraspinous muscle elevated from the lamina of 3-4 and 5.  McCullough retractor was placed.  Kochers confirmed the spinous  processes of 3-4.  I used the Leksell rongeur to remove the spinous  process of 3-4 and partially at 5.  He had fairly severe stenosis at 3-  4, and therefore we entered at the 4-5 interspace.  We performed  hemilaminotomies of the caudad edge of 4, progressing cephalad, placing  neural patties beneath the neural elements.  The central lamina of 4 was  removed.  There was hypertrophic ligamentum noted between S4-5.  We  removed the ligamentum flavum bilaterally at 4-5.  We used the operating  microscope.  We continued cephalad.  Severe stenosis was noted  multifactorial at 3-4.  After removal of the central lamina of 4 with  continued stenosis noted, we proceeded with a laminectomy at 3  centrally, first with a 2 mm Kerrison extending cephalad and then  medially laterally, removing the entire lamina.  Ligamentum flavum was  then removed from the interspace at 2-3 as well.  We turned our  attention back to 3-4 into the  lateral recesses.  We continued the  decompression down to the medial border of the pedicle bilaterally.  Severe multifactorial stenosis was noted.  We undercut the facets at 3-  4.  I examined the disk.  There was no disk herniation.  I passed a  hockey stick probe out the foramens of 2 and 3 and 4 bilaterally.  They  were patent.  We then continued with a decompression caudad and on the  left.  There was significant stenosis at 4-5 on the left and 5 foramen.  I performed a foraminotomy of 5 with significant compression noted on  the root at that point.  The disk at 4-5 was protruding.  It was  hardened.  I therefore performed foraminotomies at 4 and 5 and had at  least 1 cm of excursion of the 5 root medial to the 5 pedicle without  difficulty, and a hockey stick probe passed freely at the foramen of 5  down along S1 and S3.  Bipolar  electrocautery was utilized to achieve  hemostasis, and extensive epidural venous plexus was noted on the left  as well.  The right side had some stenosis but was not as severe as the  left.  We decompressed that as well, performed a foraminotomy of 5 and  took a final x-ray with a hockey-stick up against 2-3 and down on 4-5,  confirming the decompression, and also there was a __________  occurringat 3-4 following the decompression.  The wound was copiously  irrigated.  Bipolar electrocautery was utilized to achieve strict  hemostasis.  We performed a Valsalva maneuver, and no evidence of CSF  leakage.  Thrombin-soaked Gelfoam was placed in laminotomy defect,  removing the McCullough retractor.  Paraspinous muscle was inspected  with no evidence of active bleeding.  Copious irrigation was utilized.  Dorsolumbar fascia reapproximated with #1 Vicryl interrupted figure-of-  eight sutures.  Subcutaneous tissue reapproximated with 2-0 Vicryl  simple sutures.  The skin was reapproximated with stapled.  The wound  was dressed sterilely, placed supine on the hospital bed, extubated  without difficulty, transported to the recovery room in satisfactory  addition.  The patient tolerated the procedure well with no  complications.  Blood loss 150 mL.      Jene Every, M.D.  Electronically Signed     JB/MEDQ  D:  09/19/2007  T:  09/19/2007  Job:  [id number]

## 2010-07-29 NOTE — Discharge Summary (Signed)
NAMEARRIYANA, Ray NO.:  [id number]   MEDICAL RECORD NO.:  [id number]          PATIENT TYPE:  INP   LOCATION:  1527                         FACILITY[facility]   PHYSICIAN:  Jene Every, M.D.    DATE OF BIRTH:  1945/11/15   DATE OF ADMISSION:  09/19/2007  DATE OF DISCHARGE:  09/21/2007                               DISCHARGE SUMMARY   ADMISSION DIAGNOSES:  1. Spinal stenosis.  2. Type 2 diabetes.  3. Hypertension.  4. Hyperlipidemia.   DISCHARGE DIAGNOSES:  1. Spinal stenosis.  2. Type 2 diabetes.  3. Hypertension.  4. Hyperlipidemia.  5. Status post lumbar decompression L2-L5.   PROCEDURE:  The patient was taken to the OR on July 9, underwent lumbar  decompression 3-4, 4-5, then 2-3, the central laminectomy.  Surgeon Dr.  Jene Every,  assistant Roma Schanz, PA-C.  Anesthesia general.  Complications none.   HISTORY:  Ms. Sandra Ray is a pleasant 65 year old female with longstanding  history of low back and lower extremity pain that has progressively  gotten worse.  She does have classic symptoms of neurogenic  claudication.  Studies revealed fairly severe stenosis at 3-4 with  moderate stenosis noted 2-3 and 4-5.  It was felt at this time the  patient would benefit from a lumbar decompression.  Risks and benefits  of the surgery were discussed with the patient and she does elect to  proceed.   CONSULTANT:  PT/OT, case management   LABORATORY:  Preoperative CBC showed white cell count 10.1, hemoglobin  14.1, hematocrit 41.9.  These were followed throughout the hospital  stay.  White cell count did bump to 17.7.  At the time of discharge was  normal at 14, hemoglobin remained stable at time of discharge at 13.1,  hematocrit 38.8.  Coagulation studies done preop were within normal  range.  Routine chemistries done preoperatively showed sodium 139,  potassium 3.5, elevated glucose of 217 with a normal BUN and creatinine.  The patient remained stable  throughout the hospital stay, glucose  normalized to 140.  Routine liver function tests within normal range.  Preoperative urinalysis showed small amount of bilirubin, trace ketones,  30 mg/dL of protein, small amount leukocyte esterase were noted, 0-2  WBCs were seen per high-powered field.  This was repeated at time of  admission, bilirubin as well as ketones and protein had resolved.  The  patient continued to have small amount of leukocyte esterase, 0-2 WBCs  noted per high-powered field.  Preoperative EKG showed normal sinus  rhythm, nonspecific T-wave abnormalities were noted.  Preoperative chest  x-ray showed no acute cardiopulmonary abnormalities.  There was noted  nonspecific 6 mm density in the right upper lobe.  They recommended non-  emergent, non-contrast CT for further evaluation.   HOSPITAL COURSE:  The patient was admitted, taken to the OR and  underwent the above stated procedure without difficulty.  She was then  transferred to the PACU and then to the orthopedic floor for routine  postoperative care.  Postop day #1, the patient was doing fairly well.  She noted  low back pain as expected.  She denied any lower extremity  symptoms.  Vital signs remained stable.  White cell count slightly  elevated.  The patient was afebrile.  Incision was clean, dry and  intact.  Motor and neurovascular function was intact.  Incentive  spirometer was encouraged.  PT OT was continued, discharge planning was  initiated.  The patient did very well with her therapy.  On postop day  #2, the patient was stable to be discharged home.  White blood cell  count was trending down at 14.  Dressing was changed.  Incision was  clean, dry.  Motor and neurovascular function remained intact to lower  extremities.   DISPOSITION:  The patient discharged home with home health, PT/OT as  needed.  No durable medical goods were dispensed. She is to follow up  with Dr. Shelle Iron in approximately 10-14 days for  suture removal.  She is  to follow up with her primary care physician in regard to her chest x-  ray, have follow-up CT done.  Wound dressing changes daily.  Okay for  her to shower in 2 days.   ACTIVITY:  She is to walk as tolerated utilizing back precautions.   DISCHARGE DIET:  Diet is high fiber.   DISCHARGE MEDICATIONS:  Include all home medications:  1. Vicodin 1-2 p.o. q.4-6 p.r.n. pain.  2. Robaxin 500 mg one p.o. q.8  p.r.n. spasm.  3. Vitamin C  500 mg daily.   CONDITION ON DISCHARGE:  Stable.   FINAL DIAGNOSIS:  Doing well status post lumbar decompression L2-L5.      Roma Schanz, P.A.      Jene Every, M.D.  Electronically Signed    CS/MEDQ  D:  10/24/2007  T:  10/24/2007  Job:  [id number]

## 2010-08-09 ENCOUNTER — Other Ambulatory Visit: Payer: Self-pay | Admitting: Family Medicine

## 2010-08-10 ENCOUNTER — Other Ambulatory Visit: Payer: Self-pay | Admitting: Family Medicine

## 2010-08-10 MED ORDER — HYDROCHLOROTHIAZIDE 25 MG PO TABS: 25.0000 mg | ORAL_TABLET | Freq: Every day | ORAL | Status: DC

## 2010-08-10 MED ORDER — PROMETHAZINE HCL 25 MG PO TABS: 25.0000 mg | ORAL_TABLET | Freq: Four times a day (QID) | ORAL | Status: DC | PRN

## 2010-08-10 MED ORDER — ATORVASTATIN CALCIUM 20 MG PO TABS: 20.0000 mg | ORAL_TABLET | Freq: Every day | ORAL | Status: DC

## 2010-08-10 MED ORDER — FENOFIBRATE MICRONIZED 130 MG PO CAPS: 130.0000 mg | ORAL_CAPSULE | Freq: Every day | ORAL | Status: DC

## 2010-08-10 MED ORDER — SITAGLIPTIN PHOS-METFORMIN HCL 50-1000 MG PO TABS: 1.0000 | ORAL_TABLET | Freq: Two times a day (BID) | ORAL | Status: DC

## 2010-08-10 MED ORDER — GABAPENTIN 300 MG PO CAPS: 300.0000 mg | ORAL_CAPSULE | Freq: Three times a day (TID) | ORAL | Status: DC

## 2010-08-10 MED ORDER — POTASSIUM CHLORIDE CRYS ER 20 MEQ PO TBCR: 20.0000 meq | EXTENDED_RELEASE_TABLET | Freq: Every day | ORAL | Status: DC

## 2010-08-10 MED ORDER — LISINOPRIL 5 MG PO TABS: 5.0000 mg | ORAL_TABLET | Freq: Every day | ORAL | Status: DC

## 2010-08-10 MED ORDER — GLIPIZIDE 10 MG PO TABS: 10.0000 mg | ORAL_TABLET | Freq: Three times a day (TID) | ORAL | Status: DC

## 2010-08-10 NOTE — Telephone Encounter (Signed)
Left message on voicemail notifying pt that rx's have been sent.

## 2010-08-10 NOTE — Telephone Encounter (Signed)
Ok to refill all? 

## 2010-08-10 NOTE — Telephone Encounter (Signed)
Phenergan was last given in March (#30 only). Please advise of refills.

## 2010-08-10 NOTE — Telephone Encounter (Signed)
Refills sent

## 2010-08-15 ENCOUNTER — Other Ambulatory Visit: Payer: Self-pay | Admitting: Family Medicine

## 2010-08-15 MED ORDER — ATORVASTATIN CALCIUM 20 MG PO TABS: 20.0000 mg | ORAL_TABLET | Freq: Every day | ORAL | Status: DC

## 2010-08-15 NOTE — Telephone Encounter (Signed)
Refill request from [facility]---- Labs done 4/12 Rx faxed   KP

## 2010-10-05 ENCOUNTER — Ambulatory Visit: Payer: BC Managed Care – PPO | Admitting: Family Medicine

## 2010-10-20 ENCOUNTER — Encounter: Payer: Self-pay | Admitting: Family Medicine

## 2010-10-20 ENCOUNTER — Ambulatory Visit (INDEPENDENT_AMBULATORY_CARE_PROVIDER_SITE_OTHER): Payer: Medicare Other | Admitting: Family Medicine

## 2010-10-20 DIAGNOSIS — I10 Essential (primary) hypertension: Secondary | ICD-10-CM

## 2010-10-20 DIAGNOSIS — E119 Type 2 diabetes mellitus without complications: Secondary | ICD-10-CM

## 2010-10-20 DIAGNOSIS — G5600 Carpal tunnel syndrome, unspecified upper limb: Secondary | ICD-10-CM | POA: Insufficient documentation

## 2010-10-20 LAB — BASIC METABOLIC PANEL
CO2: 26 mEq/L (ref 19–32)
Calcium: 9.7 mg/dL (ref 8.4–10.5)
Chloride: 99 mEq/L (ref 96–112)
Creatinine, Ser: 0.8 mg/dL (ref 0.4–1.2)
Sodium: 137 mEq/L (ref 135–145)

## 2010-10-20 MED ORDER — FENOFIBRATE MICRONIZED 130 MG PO CAPS: 130.0000 mg | ORAL_CAPSULE | Freq: Every day | ORAL | Status: DC

## 2010-10-20 MED ORDER — HYDROCHLOROTHIAZIDE 25 MG PO TABS: 25.0000 mg | ORAL_TABLET | Freq: Every day | ORAL | Status: DC

## 2010-10-20 MED ORDER — ATORVASTATIN CALCIUM 20 MG PO TABS: 20.0000 mg | ORAL_TABLET | Freq: Every day | ORAL | Status: DC

## 2010-10-20 MED ORDER — POTASSIUM CHLORIDE CRYS ER 20 MEQ PO TBCR: 20.0000 meq | EXTENDED_RELEASE_TABLET | Freq: Every day | ORAL | Status: DC

## 2010-10-20 MED ORDER — SITAGLIPTIN PHOS-METFORMIN HCL 50-1000 MG PO TABS: 1.0000 | ORAL_TABLET | Freq: Two times a day (BID) | ORAL | Status: DC

## 2010-10-20 MED ORDER — PROMETHAZINE HCL 25 MG PO TABS: 25.0000 mg | ORAL_TABLET | Freq: Four times a day (QID) | ORAL | Status: DC | PRN

## 2010-10-20 MED ORDER — GABAPENTIN 300 MG PO CAPS: 300.0000 mg | ORAL_CAPSULE | Freq: Three times a day (TID) | ORAL | Status: DC

## 2010-10-20 MED ORDER — LISINOPRIL 5 MG PO TABS: 5.0000 mg | ORAL_TABLET | Freq: Every day | ORAL | Status: DC

## 2010-10-20 NOTE — Patient Instructions (Signed)
Schedule your complete physical for January- do not eat before this appt You look fantastic!  Keep up the good work! We'll notify you of your lab results Someone will call you with your hand appt Call with any questions or concerns Enjoy the rest of your summer!!!

## 2010-10-20 NOTE — Progress Notes (Signed)
  Subjective:    Patient ID: Sandra Ray, female    DOB: November 12, 1945, 65 y.o.   MRN: [id number]  HPI DM- chronic problem for pt, on Janumet, Glucotrol.  CBGs ranging from 70s-110s.  Few symptomatic lows in the 60s that respond w/ food.  No CP, SOB, HAs, visual changes, edema.  UTD on eye exam.  Walking regularly.  HTN- chronic problem for pt, excellent BP control.  On ACE, HCTZ.  Asymptomatic.  R Ingrown toenail- went to podiatry, had nail edge removed.  Also had diabetic foot exam at this time.  Carpal tunnel- sxs are worsening on R, would like to see a hand specialist.   Review of Systems For ROS see HPI     Objective:   Physical Exam  Constitutional: She is oriented to person, place, and time. She appears well-developed and well-nourished. No distress.  HENT:  Head: Normocephalic and atraumatic.  Eyes: Conjunctivae and EOM are normal. Pupils are equal, round, and reactive to light.  Neck: Normal range of motion. Neck supple. No thyromegaly present.  Cardiovascular: Normal rate, regular rhythm, normal heart sounds and intact distal pulses.   No murmur heard. Pulmonary/Chest: Effort normal and breath sounds normal. No respiratory distress.  Abdominal: Soft. She exhibits no distension. There is no tenderness.  Musculoskeletal: She exhibits tenderness (in R wrist). She exhibits no edema.  Lymphadenopathy:    She has no cervical adenopathy.  Neurological: She is alert and oriented to person, place, and time.  Skin: Skin is warm and dry.  Psychiatric: She has a normal mood and affect. Her behavior is normal.          Assessment & Plan:

## 2010-10-21 LAB — HEMOGLOBIN A1C: Hgb A1c MFr Bld: 7 % — ABNORMAL HIGH (ref 4.6–6.5)

## 2010-10-23 NOTE — Assessment & Plan Note (Signed)
UTD on eye and foot exam.  CBGs are excellent.  Check labs- adjust meds prn.

## 2010-10-23 NOTE — Assessment & Plan Note (Signed)
Pt has had this problem for awhile and was told to see a hand specialist if sxs worsen.  She reports the burning and tingling is now very bothersome and would like to pursue an evaluation and tx.  Referral made.

## 2010-10-23 NOTE — Assessment & Plan Note (Signed)
Well controlled.  Asymptomatic.  No changes. 

## 2010-10-24 ENCOUNTER — Telehealth: Payer: Self-pay

## 2010-10-24 NOTE — Telephone Encounter (Signed)
Message copied by Beverely Low on Mon Oct 24, 2010  4:52 PM ------      Message from: Sheliah Hatch      Created: Fri Oct 21, 2010  2:26 PM       Labs look great!  Keep up the good work!

## 2010-10-24 NOTE — Telephone Encounter (Signed)
Labs mailed

## 2010-11-22 ENCOUNTER — Telehealth: Payer: Self-pay | Admitting: Family Medicine

## 2010-11-22 NOTE — Telephone Encounter (Signed)
(  continued) give her lab results that Dr Beverely Low wanted--is there another med that she can use that will not give her muscle cramps??  Told pt that Dr Beverely Low is not here this week and she said Dr Laury Axon is also familiar with her problem

## 2010-11-22 NOTE — Telephone Encounter (Signed)
Pt has restarted simvastatin with no cramps but notes that this med was not strong enough to bring cholesterol down. Pt would like to know if dr Beverely Low can recommend a med similar to this that will help to improve her cholesterol but will not cause cramps. .Please advise   Pt aware out of the office.

## 2010-11-23 ENCOUNTER — Telehealth: Payer: Self-pay | Admitting: *Deleted

## 2010-11-23 ENCOUNTER — Emergency Department (INDEPENDENT_AMBULATORY_CARE_PROVIDER_SITE_OTHER): Payer: Medicare Other

## 2010-11-23 ENCOUNTER — Emergency Department (HOSPITAL_BASED_OUTPATIENT_CLINIC_OR_DEPARTMENT_OTHER)
Admission: EM | Admit: 2010-11-23 | Discharge: 2010-11-23 | Disposition: A | Discharge status: Home / Self Care | Payer: Medicare Other | Attending: Emergency Medicine | Admitting: Emergency Medicine

## 2010-11-23 ENCOUNTER — Encounter (HOSPITAL_BASED_OUTPATIENT_CLINIC_OR_DEPARTMENT_OTHER): Payer: Self-pay | Admitting: Family Medicine

## 2010-11-23 DIAGNOSIS — W010XXA Fall on same level from slipping, tripping and stumbling without subsequent striking against object, initial encounter: Secondary | ICD-10-CM | POA: Insufficient documentation

## 2010-11-23 DIAGNOSIS — S0990XA Unspecified injury of head, initial encounter: Secondary | ICD-10-CM

## 2010-11-23 DIAGNOSIS — S61409A Unspecified open wound of unspecified hand, initial encounter: Secondary | ICD-10-CM | POA: Insufficient documentation

## 2010-11-23 DIAGNOSIS — I679 Cerebrovascular disease, unspecified: Secondary | ICD-10-CM

## 2010-11-23 DIAGNOSIS — M549 Dorsalgia, unspecified: Secondary | ICD-10-CM | POA: Insufficient documentation

## 2010-11-23 DIAGNOSIS — M542 Cervicalgia: Secondary | ICD-10-CM

## 2010-11-23 DIAGNOSIS — M25549 Pain in joints of unspecified hand: Secondary | ICD-10-CM

## 2010-11-23 DIAGNOSIS — M545 Low back pain: Secondary | ICD-10-CM

## 2010-11-23 DIAGNOSIS — W19XXXA Unspecified fall, initial encounter: Secondary | ICD-10-CM

## 2010-11-23 DIAGNOSIS — S0003XA Contusion of scalp, initial encounter: Secondary | ICD-10-CM | POA: Insufficient documentation

## 2010-11-23 DIAGNOSIS — I10 Essential (primary) hypertension: Secondary | ICD-10-CM | POA: Insufficient documentation

## 2010-11-23 DIAGNOSIS — E785 Hyperlipidemia, unspecified: Secondary | ICD-10-CM | POA: Insufficient documentation

## 2010-11-23 DIAGNOSIS — IMO0002 Reserved for concepts with insufficient information to code with codable children: Secondary | ICD-10-CM | POA: Insufficient documentation

## 2010-11-23 DIAGNOSIS — E119 Type 2 diabetes mellitus without complications: Secondary | ICD-10-CM | POA: Insufficient documentation

## 2010-11-23 DIAGNOSIS — S61411A Laceration without foreign body of right hand, initial encounter: Secondary | ICD-10-CM

## 2010-11-23 MED ORDER — METHOCARBAMOL 500 MG PO TABS: 500.0000 mg | ORAL_TABLET | Freq: Two times a day (BID) | ORAL | Status: AC

## 2010-11-23 NOTE — Telephone Encounter (Signed)
Pt has Lipitor on med list which is appropriate to control cholesterol

## 2010-11-23 NOTE — Telephone Encounter (Signed)
Pt c/o cramps with Lipitor. Pt wants med that will not cause same effect. Please advise

## 2010-11-23 NOTE — ED Notes (Signed)
Pt sts she "tripped and fell while shopping". Pt sts she hit the back of her head on concrete and injured right wrist "that has recently been operated on" and also c/o back pain. Pt sts she has had back surgery as well. Pt denies LOC, n/v, dizziness.

## 2010-11-23 NOTE — ED Provider Notes (Signed)
History     CSN: [id number] Arrival date & time: 11/23/2010  5:04 PM  Chief Complaint  Patient presents with  . Fall   HPI Patient was walking and stance when she tripped and slipped over a mat. States she fell backwards onto her bottom and hit the back of her head. Also reports catching herself with her right hand. States she recently had a carpal tunnel surgery about a week ago. Reports when she caught herself with her right hand she reinjured the surgical scar. States it is bleeding. Denies LOC, nausea, vomiting, altered mental status, dizziness, numbness tingling weakness, urinary or fecal incontinence. Reports a significant history of a lumbar surgery.   Past Medical History  Diagnosis Date  . Diabetes mellitus type II   . Hyperlipidemia   . Hypertension   . Degenerative disc disease     Past Surgical History  Procedure Date  . Lumbar laminectomy     [id number]. L4,L5,S1  . Cesarean section     x2  . Tubal ligation   . Lumbar fusion   . Abdominal hysterectomy   . Carpel tunnel     Family History  Problem Relation Age of Onset  . Hypertension Father   . Diabetes Mother   . Stroke Mother   . Breast cancer Maternal Aunt   . Breast cancer Sister     History  Substance Use Topics  . Smoking status: Never Smoker   . Smokeless tobacco: Not on file  . Alcohol Use: No    OB History    Grav Para Term Preterm Abortions TAB SAB Ect Mult Living                  Review of Systems  Constitutional: Negative for fever and chills.  HENT: Positive for neck pain.   Cardiovascular: Negative for chest pain.  Gastrointestinal: Negative for abdominal pain.  Musculoskeletal: Positive for back pain (wrist pain).  Skin: Positive for wound.  Neurological: Negative for dizziness, weakness, numbness and headaches.    Physical Exam  BP 145/68  Pulse 83  Temp(Src) 98.1 F (36.7 C) (Oral)  Resp 18  Ht 5\' 9"  (1.753 m)  Wt 220 lb (99.791 kg)  BMI 32.49 kg/m2  SpO2  98%  Physical Exam  Constitutional: She is oriented to person, place, and time. She appears well-developed and well-nourished.  HENT:  Head: Normocephalic and atraumatic.  Eyes: Conjunctivae and EOM are normal. Pupils are equal, round, and reactive to light.  Neck: Normal range of motion. Neck supple.  Cardiovascular: Normal rate, regular rhythm and normal heart sounds.  Exam reveals no friction rub.   No murmur heard. Pulmonary/Chest: Effort normal and breath sounds normal. She has no wheezes. She has no rales. She exhibits no tenderness.  Abdominal: Soft. Bowel sounds are normal. She exhibits no distension and no mass. There is no tenderness. There is no rebound and no guarding.  Musculoskeletal:       Right wrist: She exhibits decreased range of motion, tenderness and laceration.       Patient has a laceration on right hand do to carpal tunnel surgery. Wound has about 6 stitches placed and is bleeding. Normal cap refill distally and normal sensation. Neurovascularly intact. Patient has bilateral soft tissue neck tenderness. And midline lumbar spinal and paraspinal tenderness. Full lumbar range of motion though slow due to pain. Normal sensation distal lower extremities. Pelvis stable  Neurological: She is alert and oriented to person, place, and time. She has normal  strength. No sensory deficit. She displays a negative Romberg sign. Coordination normal.  Skin: Skin is warm and dry. No rash noted. No erythema. No pallor.    ED Course  Procedures  MDM Results for orders placed in visit on 10/20/10  BASIC METABOLIC PANEL      Component Value Range   Sodium 137  135 - 145 (mEq/L)   Potassium 3.7  3.5 - 5.1 (mEq/L)   Chloride 99  96 - 112 (mEq/L)   CO2 26  19 - 32 (mEq/L)   Glucose, Bld 141 (*) 70 - 99 (mg/dL)   BUN 20  6 - 23 (mg/dL)   Creatinine, Ser 0.8  0.4 - 1.2 (mg/dL)   Calcium 9.7  8.4 - 16.1 (mg/dL)   GFR 09.60  >45.40 (mL/min)  HEMOGLOBIN A1C      Component Value Range    Hemoglobin A1C 7.0 (*) 4.6 - 6.5 (%)   Dg Lumbar Spine Complete  11/23/2010  *RADIOLOGY REPORT*  Clinical Data: Recent fall with pain  LUMBAR SPINE - COMPLETE 4+ VIEW  Comparison: The spine films of 11/01/2009  Findings: Hardware for posterior fusion from L4-S1 appears stable. Interbody spacers at L4-5 and L5-S1 are unchanged in position. Normal alignment is maintained.  No compression deformity is seen. The SI joints appear normal.  IMPRESSION: Stable appearance of posterior fusion from L4 S1.  No change in alignment.  Original Report Authenticated By: Juline Patch, M.D.   Ct Head Wo Contrast  11/23/2010  *RADIOLOGY REPORT*  Clinical Data: 65 year old female status post fall with head injury.  CT HEAD WITHOUT CONTRAST  Technique:  Contiguous axial images were obtained from the base of the skull through the vertex without contrast.  Comparison: None.  Findings: Visualized orbit soft tissues are within normal limits. No focal scalp hematoma.  Questionable posterior convexity scalp contusion.  Underlying calvarium intact. Visualized paranasal sinuses and mastoids are clear.  Calcified atherosclerosis at the skull base.  Occasional subcortical white matter hypodensity. Cerebral volume is within normal limits for age.  No midline shift, ventriculomegaly, mass effect, evidence of mass lesion, intracranial hemorrhage or evidence of cortically based acute infarction.  Gray-white matter differentiation is within normal limits throughout the brain.  No suspicious intracranial vascular hyperdensity.  IMPRESSION:  1.  Questionable mild posterior scalp contusion.  No underlying fracture. 2.  Mild for age nonspecific white matter changes, otherwise negative noncontrast CT appearance of the brain.  Original Report Authenticated By: Harley Hallmark, M.D.   Dg Hand Complete Right  11/23/2010  *RADIOLOGY REPORT*  Clinical Data: Recent fall with pain  RIGHT HAND - COMPLETE 3+ VIEW  Comparison: None.  Findings: The  radiocarpal joint space appears normal.  The carpal bones are in normal position.  MCP, PIP, and DIP joints appear normal with no evidence of erosion.  No fracture is seen.  Only mild degenerative change is noted involving the DIP joints. Arterial calcification is present.  IMPRESSION: No acute bony abnormality.  Original Report Authenticated By: Juline Patch, M.D.          Thomasene Lot, [place] 11/23/10 2229

## 2010-11-23 NOTE — Telephone Encounter (Signed)
Patient came in to office today after falling at [facility] over her husband's "flat cart": Patient just had CTS Sx on right wrist at [facility], no busted stitches. Patient has had [3] spinal fusion Sxs, the last one year ago-LBP in area of last Sx. Patient hit back of head on floor during accident-observed, no laceration and/or bleeding; Pt presented with knot on back of head in center. To be cautionary, patient advised to report to [facility] [to rule out further unseen injuries]. Patient understood & complied.

## 2010-11-24 MED ORDER — SIMVASTATIN 40 MG PO TABS: 40.0000 mg | ORAL_TABLET | Freq: Every day | ORAL | Status: DC

## 2010-11-24 NOTE — ED Provider Notes (Signed)
Medical screening examination/treatment/procedure(s) were conducted as a shared visit with non-physician practitioner(s) and myself.  I personally evaluated the patient during the encounter  Pt well appearing, no distress, will f/u with her Hand surgeon, bleeding controlled at this time  Joya Gaskins, MD 11/24/10 [phone]

## 2010-11-24 NOTE — Telephone Encounter (Signed)
If she has cramps w/ lipitor then she should stay on simvastatin- some cholesterol medicine is better than no cholesterol medicine.

## 2010-11-24 NOTE — Telephone Encounter (Signed)
Discuss with patient, will focus on diet and exercise as well.

## 2010-12-08 LAB — COMPREHENSIVE METABOLIC PANEL
AST: 26
Albumin: 4
Calcium: 9.7
Creatinine, Ser: 0.7
GFR calc Af Amer: >60
GFR calc non Af Amer: >60
Sodium: 139
Total Protein: 6.7

## 2010-12-08 LAB — URINALYSIS, ROUTINE W REFLEX MICROSCOPIC
Ketones, ur: NEGATIVE
Nitrite: NEGATIVE
Nitrite: NEGATIVE
Specific Gravity, Urine: 1.017
Specific Gravity, Urine: 1.03
Urobilinogen, UA: 1
pH: 5.5
pH: 7.5

## 2010-12-08 LAB — CBC
MCHC: 33.7
MCHC: 34.2
MCV: 88.8
Platelets: 312
RBC: 4.03
RBC: 4.33
RDW: 13.1
RDW: 13.3
WBC: 14 — ABNORMAL HIGH

## 2010-12-08 LAB — BASIC METABOLIC PANEL
CO2: 30
Calcium: 8.9
Creatinine, Ser: 0.57
GFR calc Af Amer: >60
Glucose, Bld: 140 — ABNORMAL HIGH

## 2010-12-08 LAB — URINE MICROSCOPIC-ADD ON

## 2010-12-08 LAB — APTT: aPTT: 25

## 2011-03-20 ENCOUNTER — Ambulatory Visit (INDEPENDENT_AMBULATORY_CARE_PROVIDER_SITE_OTHER): Payer: Medicare Other | Admitting: Family Medicine

## 2011-03-20 ENCOUNTER — Encounter: Payer: Self-pay | Admitting: Family Medicine

## 2011-03-20 VITALS — BP 140/90 | HR 89 | Temp 97.7°F | Ht 68.5 in | Wt 219.2 lb

## 2011-03-20 DIAGNOSIS — E785 Hyperlipidemia, unspecified: Secondary | ICD-10-CM

## 2011-03-20 DIAGNOSIS — E119 Type 2 diabetes mellitus without complications: Secondary | ICD-10-CM

## 2011-03-20 DIAGNOSIS — I10 Essential (primary) hypertension: Secondary | ICD-10-CM

## 2011-03-20 LAB — BASIC METABOLIC PANEL
CO2: 26 mEq/L (ref 19–32)
Glucose, Bld: 101 mg/dL — ABNORMAL HIGH (ref 70–99)
Potassium: 3.7 mEq/L (ref 3.5–5.1)
Sodium: 140 mEq/L (ref 135–145)

## 2011-03-20 LAB — LDL CHOLESTEROL, DIRECT: Direct LDL: 92.3 mg/dL

## 2011-03-20 LAB — HEMOGLOBIN A1C: Hgb A1c MFr Bld: 7 % — ABNORMAL HIGH (ref 4.6–6.5)

## 2011-03-20 NOTE — Assessment & Plan Note (Signed)
Chronic problem.  BP slightly elevated today but pt admits to rushing this AM.  Asymptomatic.  No changes at this time.

## 2011-03-20 NOTE — Progress Notes (Signed)
  Subjective:    Patient ID: Sandra Ray, female    DOB: [birth year], 66 y.o.   MRN: [id number]  HPI DM- chronic problem, on Janumet and glipizide.  Has not been checking CBGs due to recent wrist surgery.  Sugar typically well controlled.  Is staying active- down 3 lbs.  No symptomatic lows- denies CP, dizziness, HA, SOB, edema.  UTD on eye exam.  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  On gabapentin.  HTN- chronic problem, on Lisinopril, HCTZ.  Slightly elevated today.  Pt admits to running around this AM.  Asymptomatic.  Hyperlipidemia- chronic problem, on Zocor.  No abd pain, N/V, myalgias.   Review of Systems For ROS see HPI     Objective:   Physical Exam  Vitals reviewed. Constitutional: She is oriented to person, place, and time. She appears well-developed and well-nourished. No distress.  HENT:  Head: Normocephalic and atraumatic.  Eyes: Conjunctivae and EOM are normal. Pupils are equal, round, and reactive to light.  Neck: Normal range of motion. Neck supple. No thyromegaly present.  Cardiovascular: Normal rate, regular rhythm, normal heart sounds and intact distal pulses.   No murmur heard. Pulmonary/Chest: Effort normal and breath sounds normal. No respiratory distress.  Abdominal: Soft. She exhibits no distension. There is no tenderness.  Musculoskeletal: She exhibits no edema.  Lymphadenopathy:    She has no cervical adenopathy.  Neurological: She is alert and oriented to person, place, and time.  Skin: Skin is warm and dry.  Psychiatric: She has a normal mood and affect. Her behavior is normal.          Assessment & Plan:

## 2011-03-20 NOTE — Patient Instructions (Signed)
Schedule your complete physical in 3 months We'll notify you of your lab results and make any changes if needed Keep up the good work!  You look great! Call with any questions or concerns Happy New Year!!!

## 2011-03-20 NOTE — Assessment & Plan Note (Signed)
Chronic problem.  Tolerating meds w/out difficulty.  Check labs.  Adjust meds prn  

## 2011-03-20 NOTE — Assessment & Plan Note (Signed)
Chronic problem.  Typically fair control.  Asymptomatic.  Known neuropathy- foot exam performed today, pt could not feel monofilament diffusely.  UTD on eye exam.  Check labs.  Adjust meds prn

## 2011-03-21 ENCOUNTER — Telehealth: Payer: Self-pay | Admitting: Family Medicine

## 2011-03-21 LAB — HEPATIC FUNCTION PANEL
AST: 30 U/L (ref 0–37)
Albumin: 4.6 g/dL (ref 3.5–5.2)
Alkaline Phosphatase: 38 U/L — ABNORMAL LOW (ref 39–117)
Total Protein: 7.3 g/dL (ref 6.0–8.3)

## 2011-03-21 MED ORDER — GLIPIZIDE 10 MG PO TABS: 20.0000 mg | ORAL_TABLET | Freq: Two times a day (BID) | ORAL | Status: DC

## 2011-03-21 NOTE — Progress Notes (Signed)
Addended by: Derry Lory A on: 03/21/2011 08:25 AM   Modules accepted: Orders

## 2011-03-21 NOTE — Telephone Encounter (Signed)
Called pt to advise we do not have samples for this and that we could call in to a local pharmacy and she stated that she will just wait til Thursday when the mail order gets here

## 2011-03-21 NOTE — Telephone Encounter (Signed)
Patient states that her mail order refill on Antara will not be here until Thursday and would like to know if we have any samples.

## 2011-03-27 ENCOUNTER — Other Ambulatory Visit: Payer: Self-pay | Admitting: Family Medicine

## 2011-03-27 DIAGNOSIS — Z1231 Encounter for screening mammogram for malignant neoplasm of breast: Secondary | ICD-10-CM

## 2011-04-27 ENCOUNTER — Ambulatory Visit
Admission: RE | Admit: 2011-04-27 | Discharge: 2011-04-27 | Disposition: A | Discharge status: Home / Self Care | Payer: Medicare Other | Source: Ambulatory Visit | Attending: Family Medicine | Admitting: Family Medicine

## 2011-04-27 DIAGNOSIS — Z1231 Encounter for screening mammogram for malignant neoplasm of breast: Secondary | ICD-10-CM

## 2011-06-19 ENCOUNTER — Encounter: Payer: Self-pay | Admitting: Family Medicine

## 2011-06-19 ENCOUNTER — Ambulatory Visit (INDEPENDENT_AMBULATORY_CARE_PROVIDER_SITE_OTHER): Payer: Medicare Other | Admitting: Family Medicine

## 2011-06-19 DIAGNOSIS — Z7189 Other specified counseling: Secondary | ICD-10-CM | POA: Insufficient documentation

## 2011-06-19 DIAGNOSIS — E785 Hyperlipidemia, unspecified: Secondary | ICD-10-CM

## 2011-06-19 DIAGNOSIS — Z780 Asymptomatic menopausal state: Secondary | ICD-10-CM

## 2011-06-19 DIAGNOSIS — Z0000 Encounter for general adult medical examination without abnormal findings: Secondary | ICD-10-CM | POA: Insufficient documentation

## 2011-06-19 DIAGNOSIS — E119 Type 2 diabetes mellitus without complications: Secondary | ICD-10-CM

## 2011-06-19 DIAGNOSIS — I10 Essential (primary) hypertension: Secondary | ICD-10-CM

## 2011-06-19 DIAGNOSIS — Z23 Encounter for immunization: Secondary | ICD-10-CM

## 2011-06-19 LAB — LIPID PANEL
Cholesterol: 160 mg/dL (ref 0–200)
LDL Cholesterol: 86 mg/dL (ref 0–99)
VLDL: 31 mg/dL (ref 0.0–40.0)

## 2011-06-19 LAB — CBC WITH DIFFERENTIAL/PLATELET
Basophils Absolute: 0 10*3/uL (ref 0.0–0.1)
Basophils Relative: 0.5 % (ref 0.0–3.0)
Hemoglobin: 13.3 g/dL (ref 12.0–15.0)
Lymphocytes Relative: 30.2 % (ref 12.0–46.0)
Monocytes Relative: 14.9 % — ABNORMAL HIGH (ref 3.0–12.0)
Neutro Abs: 2.9 10*3/uL (ref 1.4–7.7)
RBC: 4.42 Mil/uL (ref 3.87–5.11)
RDW: 14.2 % (ref 11.5–14.6)

## 2011-06-19 LAB — BASIC METABOLIC PANEL WITH GFR
BUN: 20 mg/dL (ref 6–23)
CO2: 25 meq/L (ref 19–32)
Calcium: 9.8 mg/dL (ref 8.4–10.5)
Chloride: 100 meq/L (ref 96–112)
Creatinine, Ser: 0.7 mg/dL (ref 0.4–1.2)
GFR: 89.01 mL/min
Glucose, Bld: 149 mg/dL — ABNORMAL HIGH (ref 70–99)
Potassium: 3.7 meq/L (ref 3.5–5.1)
Sodium: 137 meq/L (ref 135–145)

## 2011-06-19 LAB — HEPATIC FUNCTION PANEL
ALT: 30 U/L (ref 0–35)
AST: 37 U/L (ref 0–37)
Bilirubin, Direct: 0.1 mg/dL (ref 0.0–0.3)
Total Protein: 7.2 g/dL (ref 6.0–8.3)

## 2011-06-19 NOTE — Progress Notes (Signed)
  Subjective:    Patient ID: Sandra Ray, female    DOB: Aug 11, 1945, 66 y.o.   MRN: [id number]  HPI Here today for CPE.  Risk Factors: DM- chronic problem, on Glipizide, Janumet.  CBGs running 90-140 but mostly in the 100-110s.  Denies symptomatic lows, CP, SOB, HAs, visual changes.  UTD on eye exam. Hyperlipidemia- chronic problem, on Zocor and Antara.  No abd pain, N/V, myalgias. HTN- chronic problem, well controlled on lisinopril, HCTZ.  Denies CP, SOB, HAs, visual changes, edema  Physical Activity: walking regularly Fall Risk: low risk, steady on feet Depression: asymptomatic Hearing: normal to conversational tones, whispered voice at 6 ft ADL's: independent Cognitive: normal linear thought process, memory and attention intact. Home Safety: safe at home Height, Weight, BMI, Visual Acuity: see vitals, vision corrected to 20/20 w/ glasses Counseling: UTD on mammo, due colonoscopy.  Overdue DEXA. Labs Ordered: See A&P Care Plan: See A&P    Review of Systems Patient reports no vision/ hearing changes, adenopathy,fever, weight change,  persistant/recurrent hoarseness , swallowing issues, chest pain, palpitations, edema, persistant/recurrent cough, hemoptysis, dyspnea (rest/exertional/paroxysmal nocturnal), gastrointestinal bleeding (melena, rectal bleeding), abdominal pain, significant heartburn, bowel changes, GU symptoms (dysuria, hematuria, incontinence), Gyn symptoms (abnormal  bleeding, pain),  syncope, focal weakness, memory loss, numbness & tingling, skin/hair/nail changes, abnormal bruising or bleeding, anxiety, or depression.     Objective:   Physical Exam General Appearance:    Alert, cooperative, no distress, appears stated age  Head:    Normocephalic, without obvious abnormality, atraumatic  Eyes:    PERRL, conjunctiva/corneas clear, EOM's intact, fundi    benign, both eyes  Ears:    Normal TM's and external ear canals, both ears  Nose:   Nares normal, septum midline,  mucosa normal, no drainage    or sinus tenderness  Throat:   Lips, mucosa, and tongue normal; teeth and gums normal  Neck:   Supple, symmetrical, trachea midline, no adenopathy;    Thyroid: no enlargement/tenderness/nodules  Back:     Symmetric, no curvature, ROM normal, no CVA tenderness  Lungs:     Clear to auscultation bilaterally, respirations unlabored  Chest Wall:    No tenderness or deformity   Heart:    Regular rate and rhythm, S1 and S2 normal, no murmur, rub   or gallop  Breast Exam:    Deferred to GYN  Abdomen:     Soft, non-tender, bowel sounds active all four quadrants,    no masses, no organomegaly  Genitalia:    Deferred to GYN  Rectal:    Extremities:   Extremities normal, atraumatic, no cyanosis or edema  Pulses:   2+ and symmetric all extremities  Skin:   Skin color, texture, turgor normal, no rashes or lesions  Lymph nodes:   Cervical, supraclavicular, and axillary nodes normal  Neurologic:   CNII-XII intact, normal strength, sensation and reflexes    throughout          Assessment & Plan:

## 2011-06-19 NOTE — Patient Instructions (Signed)
Follow up in 3 months to recheck diabetes We'll call you with your bone density appt Schedule your yearly eye exam Call and get your colonoscopy set up You look great!  Keep up the good work! We'll notify you of your lab results Go to [address] at your convenience for your chest xray Call with any questions or concerns Happy Spring (and early birthday!)

## 2011-06-20 NOTE — Assessment & Plan Note (Signed)
Pt's PE WNL.  Refer for DEXA.  UTD on mammo.  Check labs.  Anticipatory guidance provided.

## 2011-06-20 NOTE — Assessment & Plan Note (Signed)
Chronic problem.  Well controlled.  Asymptomatic.  No changes. 

## 2011-06-20 NOTE — Assessment & Plan Note (Signed)
Chronic problem.  Tolerating meds w/out difficulty.  Check labs.  Adjust meds prn  

## 2011-06-20 NOTE — Assessment & Plan Note (Signed)
Chronic problem.  Typically well controlled.  Asymptomatic.  UTD on eye exam.  Check labs.  Adjust meds prn

## 2011-06-22 ENCOUNTER — Encounter: Payer: Self-pay | Admitting: *Deleted

## 2011-06-22 LAB — VITAMIN D 1,25 DIHYDROXY
Vitamin D 1, 25 (OH)2 Total: 56 pg/mL (ref 18–72)
Vitamin D3 1, 25 (OH)2: 56 pg/mL

## 2011-06-26 ENCOUNTER — Encounter: Payer: Self-pay | Admitting: *Deleted

## 2011-06-29 ENCOUNTER — Ambulatory Visit
Admission: RE | Admit: 2011-06-29 | Discharge: 2011-06-29 | Disposition: A | Discharge status: Home / Self Care | Payer: Medicare Other | Source: Ambulatory Visit | Attending: Family Medicine | Admitting: Family Medicine

## 2011-06-29 ENCOUNTER — Ambulatory Visit (INDEPENDENT_AMBULATORY_CARE_PROVIDER_SITE_OTHER)
Admission: RE | Admit: 2011-06-29 | Discharge: 2011-06-29 | Disposition: A | Discharge status: Home / Self Care | Payer: Medicare Other | Source: Ambulatory Visit | Attending: Family Medicine | Admitting: Family Medicine

## 2011-06-29 DIAGNOSIS — I10 Essential (primary) hypertension: Secondary | ICD-10-CM

## 2011-06-29 DIAGNOSIS — Z780 Asymptomatic menopausal state: Secondary | ICD-10-CM

## 2011-06-30 ENCOUNTER — Telehealth: Payer: Self-pay | Admitting: Family Medicine

## 2011-06-30 MED ORDER — GABAPENTIN 300 MG PO CAPS: 300.0000 mg | ORAL_CAPSULE | Freq: Three times a day (TID) | ORAL | Status: DC

## 2011-06-30 NOTE — Telephone Encounter (Signed)
Refill done.  

## 2011-06-30 NOTE — Telephone Encounter (Signed)
New rx request for: gabapentin 300mg .

## 2011-07-25 ENCOUNTER — Telehealth: Payer: Self-pay | Admitting: *Deleted

## 2011-07-25 NOTE — Telephone Encounter (Signed)
Pt had recent bone density test that resulted as normal per noted by MD Beverely Low, mailed pt the copy of the normal test results.

## 2011-08-09 ENCOUNTER — Other Ambulatory Visit: Payer: Self-pay | Admitting: *Deleted

## 2011-08-09 NOTE — Telephone Encounter (Signed)
Pt left msg on triage vmail & stated she needs 2 meds refilled but she did not specify which meds.

## 2011-08-10 MED ORDER — GABAPENTIN 300 MG PO CAPS: 300.0000 mg | ORAL_CAPSULE | Freq: Three times a day (TID) | ORAL | Status: DC

## 2011-08-10 MED ORDER — GLIPIZIDE 10 MG PO TABS: 20.0000 mg | ORAL_TABLET | Freq: Two times a day (BID) | ORAL | Status: DC

## 2011-08-10 NOTE — Telephone Encounter (Signed)
Send all medications via escribe

## 2011-08-10 NOTE — Telephone Encounter (Signed)
Called to to clarify the medication she needs to have refilled, noted gapbentin and glipizide sent by mail order [facility], advised that we will get the rx sent in for her as soon as possible, pt understood

## 2011-09-18 ENCOUNTER — Ambulatory Visit (INDEPENDENT_AMBULATORY_CARE_PROVIDER_SITE_OTHER): Payer: Medicare Other | Admitting: Family Medicine

## 2011-09-18 ENCOUNTER — Encounter: Payer: Self-pay | Admitting: Family Medicine

## 2011-09-18 VITALS — BP 123/80 | HR 90 | Temp 98.6°F | Ht 69.0 in | Wt 221.0 lb

## 2011-09-18 DIAGNOSIS — E119 Type 2 diabetes mellitus without complications: Secondary | ICD-10-CM

## 2011-09-18 DIAGNOSIS — M62838 Other muscle spasm: Secondary | ICD-10-CM

## 2011-09-18 HISTORY — DX: Other muscle spasm: M62.838

## 2011-09-18 LAB — BASIC METABOLIC PANEL
Chloride: 103 mEq/L (ref 96–112)
Potassium: 3.8 mEq/L (ref 3.5–5.1)
Sodium: 139 mEq/L (ref 135–145)

## 2011-09-18 LAB — HEMOGLOBIN A1C: Hgb A1c MFr Bld: 7.3 % — ABNORMAL HIGH (ref 4.6–6.5)

## 2011-09-18 NOTE — Patient Instructions (Addendum)
Follow up in 3 months to recheck diabetes and cholesterol We'll notify you of your lab results Try and resume healthy eating and regular activity I'm so sorry for your loss Call with any questions or concerns Hang in there!!!

## 2011-09-18 NOTE — Assessment & Plan Note (Signed)
New.  Pt's sxs already improving.  Tylenol/heating pad prn.  Reviewed supportive care and red flags that should prompt return.  Pt expressed understanding and is in agreement w/ plan.

## 2011-09-18 NOTE — Progress Notes (Signed)
  Subjective:    Patient ID: Sandra Ray, female    DOB: 01-21-1946, 66 y.o.   MRN: [id number]  HPI DM- chronic problem.  3 months ago A1C was increasing.  On Janumet and Glipizide.  Had eye exam 07/29/11- no retinopathy.  Denies symptomatic lows- no dizziness, shaking, N/V, CP, SOB, HAs, visual changes, edema.  Has gained 4 lbs since last visit due to loss of granddaughter in late April.    R shoulder blade pain- pt reports this is improving, worse w/ particular movements- turning, coughing.   Review of Systems For ROS see HPI     Objective:   Physical Exam  Vitals reviewed. Constitutional: She is oriented to person, place, and time. She appears well-developed and well-nourished. No distress.  HENT:  Head: Normocephalic and atraumatic.  Eyes: Conjunctivae and EOM are normal. Pupils are equal, round, and reactive to light.  Neck: Normal range of motion. Neck supple. No thyromegaly present.  Cardiovascular: Normal rate, regular rhythm, normal heart sounds and intact distal pulses.   No murmur heard. Pulmonary/Chest: Effort normal and breath sounds normal. No respiratory distress.  Abdominal: Soft. She exhibits no distension. There is no tenderness.  Musculoskeletal: Normal range of motion. She exhibits tenderness (mild TTP over R lat). She exhibits no edema.  Lymphadenopathy:    She has no cervical adenopathy.  Neurological: She is alert and oriented to person, place, and time.  Skin: Skin is warm and dry.  Psychiatric: She has a normal mood and affect. Her behavior is normal.          Assessment & Plan:

## 2011-09-18 NOTE — Assessment & Plan Note (Signed)
Chronic problem.  Typically fair control.  Has gained weight due to emotional eating.  Again discussed healthy diet and regular exercise.  UTD on eye exam.  Foot exam done today.  Asymptomatic.  Check labs- adjust meds prn.

## 2011-10-27 IMAGING — CR DG LUMBAR SPINE 2-3V
2 series · 2 of 2 positions shown · non-contrast
Comparison: Lumbar spine MRI 06/18/2009.

CLINICAL DATA: Patient for lumbar surgery.

LUMBAR SPINE - 2-3 VIEW

[view not recorded (1 of 2)]
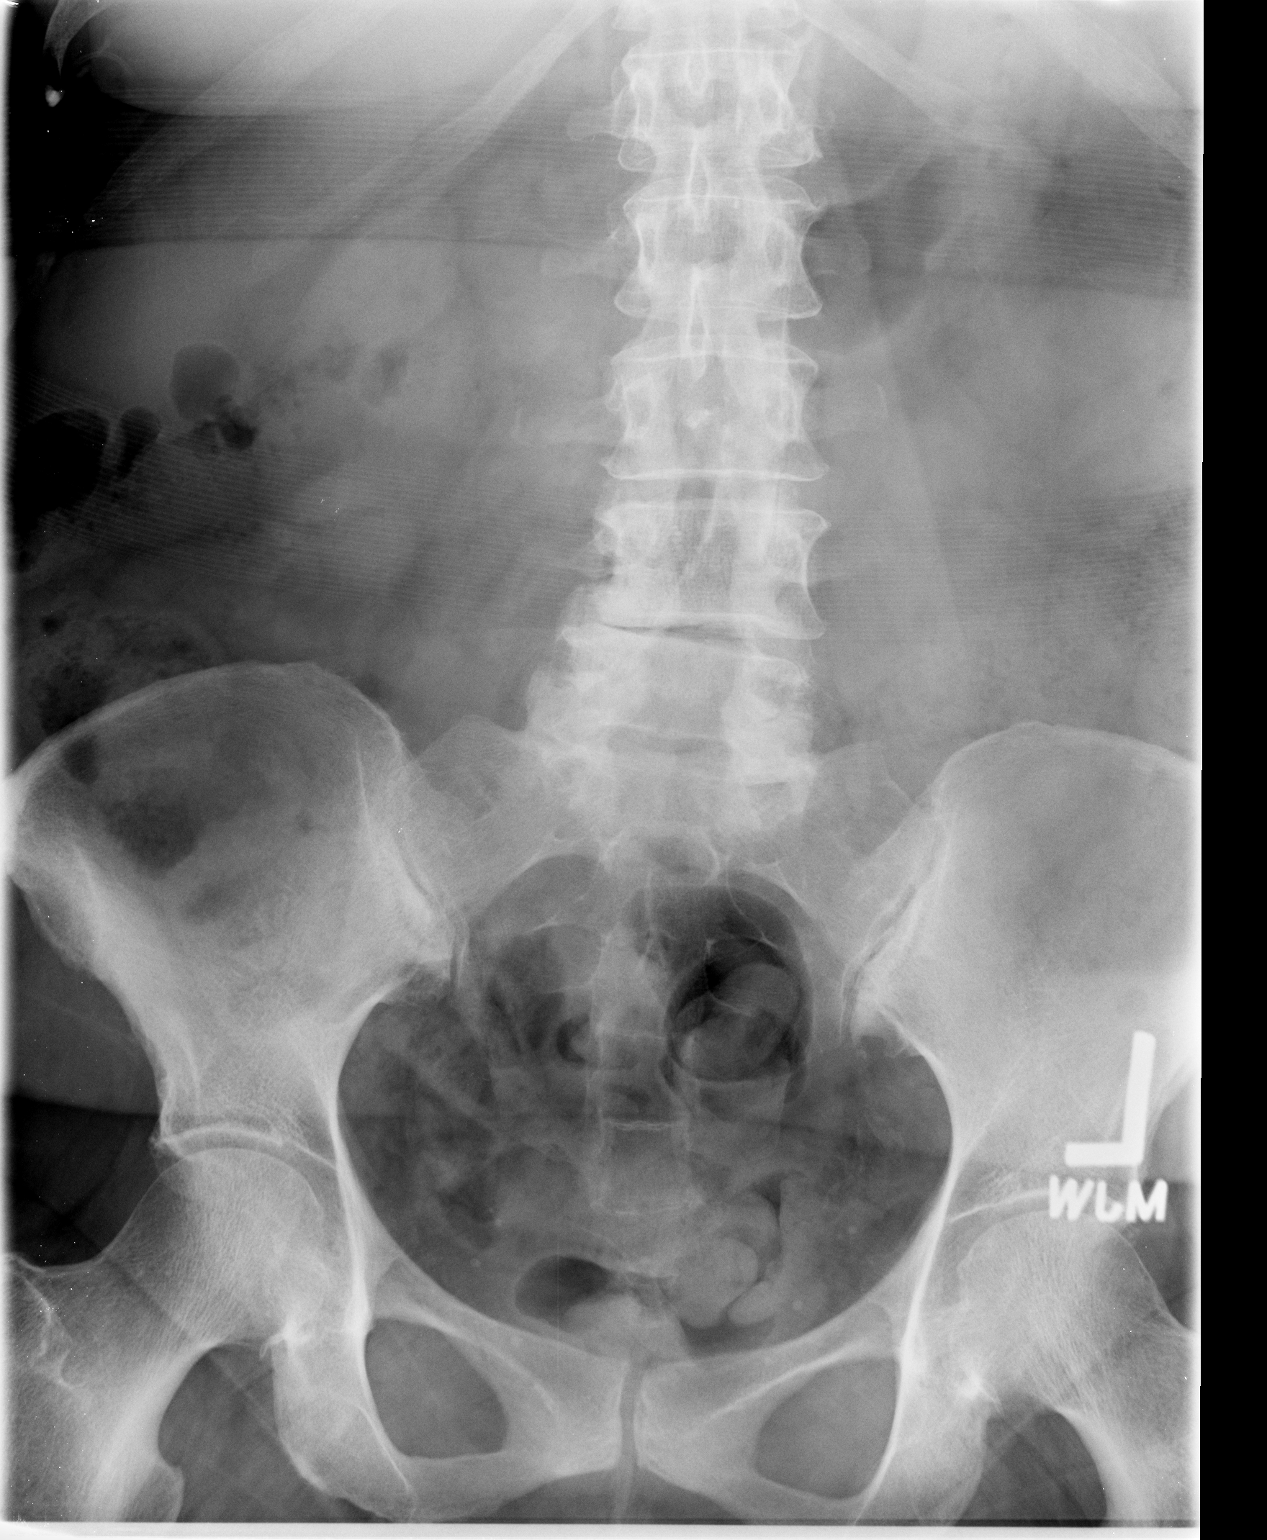

[view not recorded (2 of 2)]
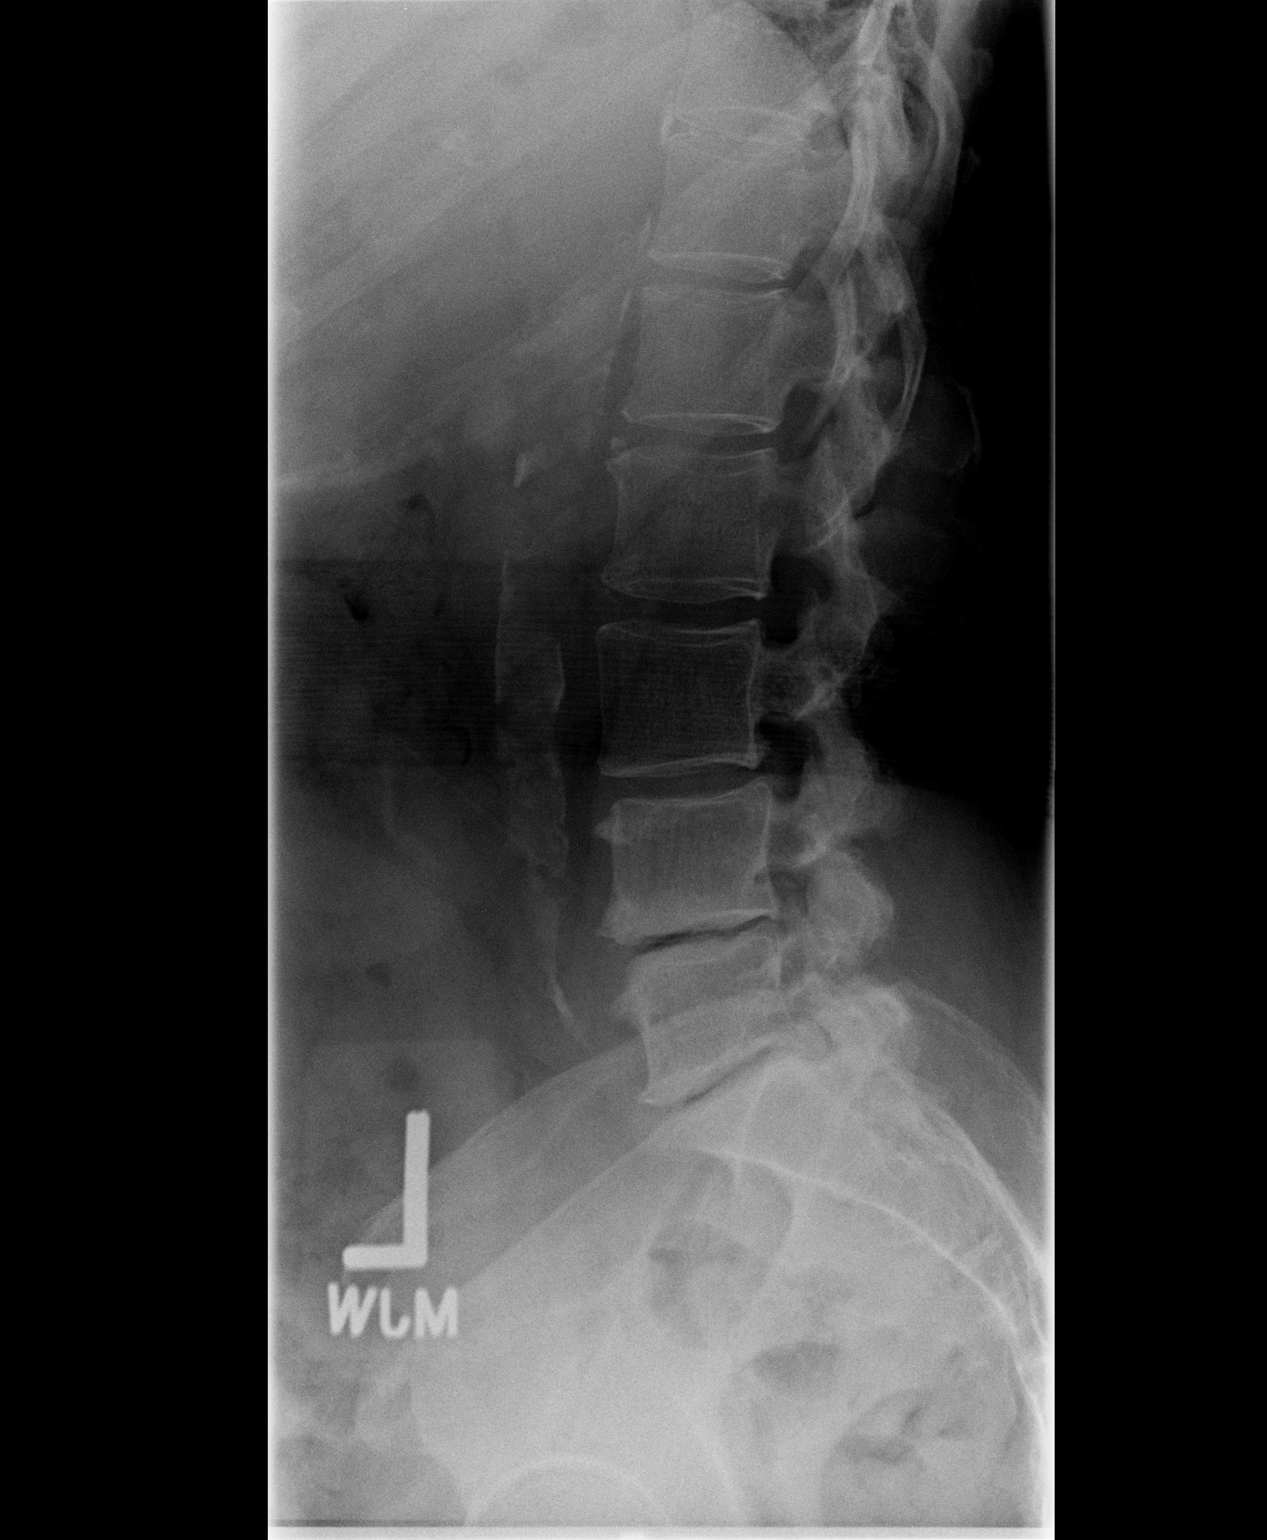

[2 of 2 positions shown; findings below may reference images not displayed]

FINDINGS: Vertebral body height and alignment are maintained.
There is marked loss of disc space height at L4-5 and L5-S1.
Laminectomy defect L4-5 and L5-S1 noted.  There is mild convex left
scoliosis.
IMPRESSION: 1.  No acute finding.
2.  Spondylosis and postoperative change.

## 2011-11-15 ENCOUNTER — Other Ambulatory Visit: Payer: Self-pay | Admitting: *Deleted

## 2011-11-15 MED ORDER — LISINOPRIL 5 MG PO TABS: 5.0000 mg | ORAL_TABLET | Freq: Every day | ORAL | Status: DC

## 2011-11-15 MED ORDER — HYDROCHLOROTHIAZIDE 25 MG PO TABS: 25.0000 mg | ORAL_TABLET | Freq: Every day | ORAL | Status: DC

## 2011-11-15 MED ORDER — SITAGLIPTIN PHOS-METFORMIN HCL 50-1000 MG PO TABS: 1.0000 | ORAL_TABLET | Freq: Two times a day (BID) | ORAL | Status: DC

## 2011-11-15 MED ORDER — SIMVASTATIN 40 MG PO TABS: 40.0000 mg | ORAL_TABLET | Freq: Every day | ORAL | Status: DC

## 2011-11-15 MED ORDER — PROMETHAZINE HCL 25 MG PO TABS: 25.0000 mg | ORAL_TABLET | Freq: Four times a day (QID) | ORAL | Status: DC | PRN

## 2011-11-15 MED ORDER — GLIPIZIDE 10 MG PO TABS: 20.0000 mg | ORAL_TABLET | Freq: Two times a day (BID) | ORAL | Status: DC

## 2011-11-23 ENCOUNTER — Telehealth: Payer: Self-pay | Admitting: Family Medicine

## 2011-11-23 NOTE — Telephone Encounter (Signed)
Pt called lm on triage line 12:32pm stating " prescription from [facility] and medication dosage change needs to speak with someone, phone # [phone]"  Pt did not indicate RX name

## 2011-11-24 NOTE — Telephone Encounter (Signed)
Lm ON TRIAGE LINE 943am wants to speak with someone today if possible regarding dosage amoutn on DM medications from [facility]  CB# [phone]

## 2011-11-24 NOTE — Telephone Encounter (Signed)
Spoke with pt, she states that the wrong amount was sent to her pharmacy for glipizide. #180 was sent in & it should have been #360 for a 90 day supply. Pt states that she spoke with the Marlan Palau at the [facility] & they are faxing [place] a form that needs to be completed so they can get an override from her insurance so they can mail her the rest of her glipizide. I told pt will contact her when we have received & completed the form.

## 2011-11-27 NOTE — Telephone Encounter (Signed)
Received incoming fax for medication, however the wrong MD is listed, will await MD Tabori advise about next step per incorrect MD Listed

## 2011-11-28 NOTE — Telephone Encounter (Signed)
Called pt to advise the correct form has been completed and faxed, pt understood, apolojized for inconvience

## 2011-12-13 ENCOUNTER — Other Ambulatory Visit: Payer: Self-pay

## 2011-12-13 MED ORDER — FENOFIBRATE MICRONIZED 130 MG PO CAPS: 130.0000 mg | ORAL_CAPSULE | Freq: Every day | ORAL | Status: DC

## 2011-12-13 NOTE — Telephone Encounter (Signed)
Rx sent.    MW 

## 2011-12-19 ENCOUNTER — Encounter: Payer: Self-pay | Admitting: Family Medicine

## 2011-12-19 ENCOUNTER — Ambulatory Visit (INDEPENDENT_AMBULATORY_CARE_PROVIDER_SITE_OTHER): Payer: Medicare Other | Admitting: Family Medicine

## 2011-12-19 VITALS — BP 118/74 | HR 83 | Temp 97.9°F | Ht 68.25 in | Wt 218.6 lb

## 2011-12-19 DIAGNOSIS — E119 Type 2 diabetes mellitus without complications: Secondary | ICD-10-CM

## 2011-12-19 DIAGNOSIS — I10 Essential (primary) hypertension: Secondary | ICD-10-CM

## 2011-12-19 DIAGNOSIS — Z23 Encounter for immunization: Secondary | ICD-10-CM

## 2011-12-19 DIAGNOSIS — E785 Hyperlipidemia, unspecified: Secondary | ICD-10-CM

## 2011-12-19 LAB — HEPATIC FUNCTION PANEL
ALT: 31 U/L (ref 0–35)
AST: 29 U/L (ref 0–37)
Bilirubin, Direct: 0 mg/dL (ref 0.0–0.3)
Total Bilirubin: 0.6 mg/dL (ref 0.3–1.2)

## 2011-12-19 LAB — BASIC METABOLIC PANEL
CO2: 24 mEq/L (ref 19–32)
Chloride: 104 mEq/L (ref 96–112)
Glucose, Bld: 136 mg/dL — ABNORMAL HIGH (ref 70–99)
Potassium: 3.9 mEq/L (ref 3.5–5.1)
Sodium: 137 mEq/L (ref 135–145)

## 2011-12-19 LAB — LIPID PANEL
LDL Cholesterol: 86 mg/dL (ref 0–99)
Total CHOL/HDL Ratio: 3
Triglycerides: 113 mg/dL (ref 0.0–149.0)

## 2011-12-19 NOTE — Patient Instructions (Addendum)
Follow up in 3 months to recheck diabetes You look great!  Keep up the good work! We'll notify you of your lab results and make any changes if needed Call with any changes or concerns Happy Fall!!!

## 2011-12-19 NOTE — Assessment & Plan Note (Signed)
Chronic problem.  CBGs indicate good control.  Asymptomatic.  UTD on eye exam.  + neuropathy of feet bilaterally, L>R.  Check labs.  Adjust meds prn

## 2011-12-19 NOTE — Progress Notes (Signed)
  Subjective:    Patient ID: Sandra Ray, female    DOB: 11-21-1945, 66 y.o.   MRN: [id number]  HPI DM- chronic problem, on Janumet and Glipizide.  UTD on eye exam- no diabetic changes, cataract formation.  Still having some nausea w/ metformin intermittently- improves w/ phenergan prn.  CBGs 79-120s.  No symptomatic lows.  Some intermittent numbness of feet (hx of back surgery)  Hyperlipidemia- chronic problem, had to stop lipitor due to muscle aches.  Has not had difficulty on simvastatin but recently has noticed bilateral thigh cramping.  Not on Co-Q10.  No abd pain, vomiting.  On fenofibrate  HTN- chronic problem, on Lisinopril and HCTZ.  No CP, SOB, HAs, visual changes, edema.  Again having leg cramps despite taking klor-con   Review of Systems For ROS see HPI     Objective:   Physical Exam  Vitals reviewed. Constitutional: She is oriented to person, place, and time. She appears well-developed and well-nourished. No distress.  HENT:  Head: Normocephalic and atraumatic.  Eyes: Conjunctivae normal and EOM are normal. Pupils are equal, round, and reactive to light.  Neck: Normal range of motion. Neck supple. No thyromegaly present.  Cardiovascular: Normal rate, regular rhythm, normal heart sounds and intact distal pulses.   No murmur heard. Pulmonary/Chest: Effort normal and breath sounds normal. No respiratory distress.  Abdominal: Soft. She exhibits no distension. There is no tenderness.  Musculoskeletal: She exhibits no edema.  Lymphadenopathy:    She has no cervical adenopathy.  Neurological: She is alert and oriented to person, place, and time.  Skin: Skin is warm and dry.  Psychiatric: She has a normal mood and affect. Her behavior is normal.          Assessment & Plan:

## 2011-12-19 NOTE — Assessment & Plan Note (Signed)
Chronic problem.  Well controlled.  Asymptomatic.  Check labs.  No anticipated changes. 

## 2011-12-19 NOTE — Assessment & Plan Note (Signed)
Chronic problem.  Again having myalgias w/ statin.  Encouraged her to start Co-Q10 to help w/ this.  Check labs.  Adjust meds prn

## 2012-01-24 ENCOUNTER — Other Ambulatory Visit: Payer: Self-pay | Admitting: Family Medicine

## 2012-01-24 MED ORDER — SITAGLIPTIN PHOS-METFORMIN HCL 50-1000 MG PO TABS: 1.0000 | ORAL_TABLET | Freq: Two times a day (BID) | ORAL | Status: DC

## 2012-01-24 MED ORDER — PROMETHAZINE HCL 25 MG PO TABS: 25.0000 mg | ORAL_TABLET | Freq: Four times a day (QID) | ORAL | Status: DC | PRN

## 2012-01-24 MED ORDER — POTASSIUM CHLORIDE CRYS ER 20 MEQ PO TBCR: 20.0000 meq | EXTENDED_RELEASE_TABLET | Freq: Every day | ORAL | Status: DC

## 2012-01-24 MED ORDER — HYDROCHLOROTHIAZIDE 25 MG PO TABS: 25.0000 mg | ORAL_TABLET | Freq: Every day | ORAL | Status: DC

## 2012-01-24 MED ORDER — SIMVASTATIN 40 MG PO TABS: 40.0000 mg | ORAL_TABLET | Freq: Every day | ORAL | Status: DC

## 2012-01-24 MED ORDER — LISINOPRIL 5 MG PO TABS: 5.0000 mg | ORAL_TABLET | Freq: Every day | ORAL | Status: DC

## 2012-01-24 NOTE — Telephone Encounter (Signed)
REFILLS X 6   last ov wt/labs 10.8.13 follow up  All below requesting 90-day supply  1-Promethazine (Tab) 25 MG Take 1 tablet (25 mg total) by mouth 4 (four) times daily as needed for  nausea --last wrt 9.4.13 #90  2-KLOR-CON M20 ER tab CTR Take 1 tablet (20 mEq total) by mouth daily.-- last wrt 8.9.12 #90 wt/3-refills  3-Hydrochlorothiazide (Tab) 25 MG Take 1 tablet (25 mg total) by mouth daily -- last wrt 9.4.13 #90  4-Lisinopril (Tab)  5 MG Take 1 tablet (5 mg total) by mouth daily -- last wrt 9.4.13 #90  5-Simvastatin (Tab) 40 MG Take 1 tablet (40 mg total) by mouth at bedtime -- last wrt 9.4.13 #90  6-Janumet Tab 50-1000 MG Take 1 tablet by mouth 2 (two) times daily -- last wrt 9.4.13 #180

## 2012-01-24 NOTE — Telephone Encounter (Signed)
Rx sent 

## 2012-03-20 ENCOUNTER — Encounter: Payer: Self-pay | Admitting: Lab

## 2012-03-20 ENCOUNTER — Encounter: Payer: Self-pay | Admitting: Family Medicine

## 2012-03-20 ENCOUNTER — Ambulatory Visit (INDEPENDENT_AMBULATORY_CARE_PROVIDER_SITE_OTHER): Payer: Medicare Other | Admitting: Family Medicine

## 2012-03-20 VITALS — BP 140/80 | HR 88 | Temp 97.5°F | Ht 69.25 in | Wt 213.6 lb

## 2012-03-20 DIAGNOSIS — L259 Unspecified contact dermatitis, unspecified cause: Secondary | ICD-10-CM

## 2012-03-20 DIAGNOSIS — L309 Dermatitis, unspecified: Secondary | ICD-10-CM

## 2012-03-20 DIAGNOSIS — E119 Type 2 diabetes mellitus without complications: Secondary | ICD-10-CM

## 2012-03-20 LAB — BASIC METABOLIC PANEL
Chloride: 104 mEq/L (ref 96–112)
Creatinine, Ser: 0.7 mg/dL (ref 0.4–1.2)
Potassium: 3.9 mEq/L (ref 3.5–5.1)

## 2012-03-20 NOTE — Assessment & Plan Note (Addendum)
Chronic problem, CBGs well controlled by pt report.  UTD on eye exam.  Asymptomatic.  Check labs.  Adjust meds prn

## 2012-03-20 NOTE — Patient Instructions (Addendum)
Schedule your complete physical in 3 months Keep up the good work!  You look great! Use hydrocortisone cream on your hand twice daily Call with any questions or concerns Happy New Year!!!

## 2012-03-20 NOTE — Progress Notes (Signed)
  Subjective:    Patient ID: Sandra Ray, female    DOB: [birth year], 67 y.o.   MRN: [id number]  HPI DM- chronic, on Glipizide, Janumet.  Reports CBGs have been well controlled- left notebook at home.  Has lost another 5 lbs since last visit.  UTD on eye exam.  No CP, SOB, HAs, visual changes, edema, numbness/tingling in feet.  No symptomatic lows.  No N/V/D.  Eczema- having cracking and drying on hands bilaterally.  Becoming painful  Review of Systems For ROS see HPI     Objective:   Physical Exam  Vitals reviewed. Constitutional: She is oriented to person, place, and time. She appears well-developed and well-nourished. No distress.  HENT:  Head: Normocephalic and atraumatic.  Eyes: Conjunctivae normal and EOM are normal. Pupils are equal, round, and reactive to light.  Neck: Normal range of motion. Neck supple. No thyromegaly present.  Cardiovascular: Normal rate, regular rhythm, normal heart sounds and intact distal pulses.   No murmur heard. Pulmonary/Chest: Effort normal and breath sounds normal. No respiratory distress.  Abdominal: Soft. She exhibits no distension. There is no tenderness.  Musculoskeletal: She exhibits no edema.  Lymphadenopathy:    She has no cervical adenopathy.  Neurological: She is alert and oriented to person, place, and time.  Skin: Skin is warm and dry. There is erythema (over L thumb w/ dry, cracked skin).  Psychiatric: She has a normal mood and affect. Her behavior is normal.          Assessment & Plan:

## 2012-03-20 NOTE — Assessment & Plan Note (Signed)
New.  Start topical steroid twice daily.  Pt expressed understanding and is in agreement w/ plan.

## 2012-03-21 ENCOUNTER — Encounter: Payer: Self-pay | Admitting: *Deleted

## 2012-04-29 ENCOUNTER — Other Ambulatory Visit: Payer: Self-pay | Admitting: *Deleted

## 2012-04-29 ENCOUNTER — Telehealth: Payer: Self-pay | Admitting: Family Medicine

## 2012-04-29 DIAGNOSIS — E876 Hypokalemia: Secondary | ICD-10-CM

## 2012-04-29 MED ORDER — POTASSIUM CHLORIDE CRYS ER 20 MEQ PO TBCR: 20.0000 meq | EXTENDED_RELEASE_TABLET | Freq: Every day | ORAL | Status: DC

## 2012-04-29 MED ORDER — GABAPENTIN 300 MG PO CAPS: 300.0000 mg | ORAL_CAPSULE | Freq: Three times a day (TID) | ORAL | Status: DC

## 2012-04-29 NOTE — Telephone Encounter (Signed)
Refill: Gabapentin 300 mg capsule. Take 1 by mouth 3 times daily. 90 day supply

## 2012-04-29 NOTE — Telephone Encounter (Signed)
Rx sent to the pharmacy([facility] mail order)by e-script.//AB/CMA

## 2012-04-29 NOTE — Telephone Encounter (Signed)
Refill for Klor-Con sent to [facility]

## 2012-04-30 ENCOUNTER — Encounter: Payer: Self-pay | Admitting: Family Medicine

## 2012-05-23 ENCOUNTER — Other Ambulatory Visit: Payer: Self-pay | Admitting: *Deleted

## 2012-05-23 DIAGNOSIS — I10 Essential (primary) hypertension: Secondary | ICD-10-CM

## 2012-05-23 MED ORDER — LISINOPRIL 5 MG PO TABS: 5.0000 mg | ORAL_TABLET | Freq: Every day | ORAL | Status: DC

## 2012-05-23 MED ORDER — HYDROCHLOROTHIAZIDE 25 MG PO TABS: 25.0000 mg | ORAL_TABLET | Freq: Every day | ORAL | Status: DC

## 2012-05-23 NOTE — Telephone Encounter (Signed)
Refills for lisinopril and hctz sent to [facility] per pt request

## 2012-05-30 ENCOUNTER — Other Ambulatory Visit: Payer: Self-pay | Admitting: *Deleted

## 2012-05-30 DIAGNOSIS — E7800 Pure hypercholesterolemia, unspecified: Secondary | ICD-10-CM

## 2012-05-30 MED ORDER — SIMVASTATIN 40 MG PO TABS: 40.0000 mg | ORAL_TABLET | Freq: Every day | ORAL | Status: DC

## 2012-05-30 NOTE — Telephone Encounter (Signed)
Refill for simvastatin sent to [facility]

## 2012-06-03 ENCOUNTER — Other Ambulatory Visit: Payer: Self-pay

## 2012-06-03 DIAGNOSIS — Z1231 Encounter for screening mammogram for malignant neoplasm of breast: Secondary | ICD-10-CM

## 2012-06-11 ENCOUNTER — Other Ambulatory Visit: Payer: Self-pay | Admitting: *Deleted

## 2012-06-11 MED ORDER — SITAGLIPTIN PHOS-METFORMIN HCL 50-1000 MG PO TABS: 1.0000 | ORAL_TABLET | Freq: Two times a day (BID) | ORAL | Status: DC

## 2012-06-11 NOTE — Telephone Encounter (Signed)
Rx sent 

## 2012-06-21 ENCOUNTER — Encounter: Payer: Self-pay | Admitting: Family Medicine

## 2012-06-21 ENCOUNTER — Ambulatory Visit (INDEPENDENT_AMBULATORY_CARE_PROVIDER_SITE_OTHER): Payer: Medicare Other | Admitting: Family Medicine

## 2012-06-21 VITALS — BP 140/76 | HR 85 | Temp 98.1°F | Ht 69.25 in | Wt 214.0 lb

## 2012-06-21 DIAGNOSIS — Z0000 Encounter for general adult medical examination without abnormal findings: Secondary | ICD-10-CM

## 2012-06-21 DIAGNOSIS — E785 Hyperlipidemia, unspecified: Secondary | ICD-10-CM

## 2012-06-21 DIAGNOSIS — I10 Essential (primary) hypertension: Secondary | ICD-10-CM

## 2012-06-21 DIAGNOSIS — E119 Type 2 diabetes mellitus without complications: Secondary | ICD-10-CM

## 2012-06-21 LAB — HEPATIC FUNCTION PANEL
ALT: 34 U/L (ref 0–35)
Albumin: 4.5 g/dL (ref 3.5–5.2)
Total Bilirubin: 0.4 mg/dL (ref 0.3–1.2)

## 2012-06-21 LAB — CBC WITH DIFFERENTIAL/PLATELET
Basophils Relative: 0.7 % (ref 0.0–3.0)
Eosinophils Absolute: 0.6 10*3/uL (ref 0.0–0.7)
Eosinophils Relative: 7.9 % — ABNORMAL HIGH (ref 0.0–5.0)
HCT: 39 % (ref 36.0–46.0)
Lymphs Abs: 2.7 10*3/uL (ref 0.7–4.0)
MCHC: 33.5 g/dL (ref 30.0–36.0)
MCV: 89.3 fl (ref 78.0–100.0)
Monocytes Absolute: 0.7 10*3/uL (ref 0.1–1.0)
Neutrophils Relative %: 49.7 % (ref 43.0–77.0)
Platelets: 328 10*3/uL (ref 150.0–400.0)

## 2012-06-21 LAB — LIPID PANEL
Cholesterol: 167 mg/dL (ref 0–200)
VLDL: 39.6 mg/dL (ref 0.0–40.0)

## 2012-06-21 LAB — BASIC METABOLIC PANEL
BUN: 17 mg/dL (ref 6–23)
Chloride: 100 mEq/L (ref 96–112)
Creatinine, Ser: 0.7 mg/dL (ref 0.4–1.2)
Glucose, Bld: 119 mg/dL — ABNORMAL HIGH (ref 70–99)

## 2012-06-21 LAB — TSH: TSH: 1.33 u[IU]/mL (ref 0.35–5.50)

## 2012-06-21 MED ORDER — GABAPENTIN 300 MG PO CAPS: 300.0000 mg | ORAL_CAPSULE | Freq: Three times a day (TID) | ORAL | Status: DC

## 2012-06-21 MED ORDER — PROMETHAZINE HCL 25 MG PO TABS: 25.0000 mg | ORAL_TABLET | Freq: Four times a day (QID) | ORAL | Status: DC | PRN

## 2012-06-21 NOTE — Assessment & Plan Note (Signed)
Chronic problem.  Tolerating statin and fenofibrate w/out difficulty.  Check labs.  Adjust meds prn

## 2012-06-21 NOTE — Assessment & Plan Note (Signed)
Pt's PE WNL.  UTD on DEXA.  Has mammo scheduled.  Due for colonoscopy.  Pt to schedule.  Check labs.  Anticipatory guidance provided.

## 2012-06-21 NOTE — Assessment & Plan Note (Signed)
Chronic problem.  Adequate control.  Asymptomatic.  No changes. 

## 2012-06-21 NOTE — Progress Notes (Signed)
  Subjective:    Patient ID: Sandra Ray, female    DOB: January 20, 1946, 67 y.o.   MRN: [id number]  HPI Here today for CPE.  Risk Factors: DM- chronic problem, on Janumet.  UTD on eye exam.  CBGs 95-120s.  No symptomatic lows. Hyperlipidemia- chronic problem, on Zocor and fenofibrate.  No abd pain, N/V, myalgias. HTN- chronic problem, adequate control on HCTZ, Lisinopril.  No CP, SOB, HAs, visual changes. Physical Activity: usually walks regularly but has been off track due to winter Fall Risk: low risk Depression: currently asymptomatic Hearing: normal to conversational tones and whispered voice at 6 ft ADL's: independent Cognitive: normal linear thought process, memory and attention intact Home Safety: safe at home, lives w/ husband and handicapped sister in law Height, Weight, BMI, Visual Acuity: see vitals, vision corrected to 20/20 w/ glasses Counseling: UTD on DEXA, mammo upcoming on 5/2.  Due for colonoscopy but put if off during back surgery. Labs Ordered: See A&P Care Plan: See A&P    Review of Systems Patient reports no vision/ hearing changes, adenopathy,fever, weight change,  persistant/recurrent hoarseness , swallowing issues, chest pain, palpitations, edema, persistant/recurrent cough, hemoptysis, dyspnea (rest/exertional/paroxysmal nocturnal), gastrointestinal bleeding (melena, rectal bleeding), abdominal pain, significant heartburn, bowel changes, GU symptoms (dysuria, hematuria, incontinence), Gyn symptoms (abnormal  bleeding, pain),  syncope, focal weakness, memory loss, skin/hair/nail changes, abnormal bruising or bleeding, anxiety, or depression.     Objective:   Physical Exam  General Appearance:    Alert, cooperative, no distress, appears stated age  Head:    Normocephalic, without obvious abnormality, atraumatic  Eyes:    PERRL, conjunctiva/corneas clear, EOM's intact, fundi    benign, both eyes  Ears:    Normal TM's and external ear canals, both ears  Nose:    Nares normal, septum midline, mucosa normal, no drainage    or sinus tenderness  Throat:   Lips, mucosa, and tongue normal; teeth and gums normal  Neck:   Supple, symmetrical, trachea midline, no adenopathy;    Thyroid: no enlargement/tenderness/nodules  Back:     Symmetric, no curvature, ROM normal, no CVA tenderness  Lungs:     Clear to auscultation bilaterally, respirations unlabored  Chest Wall:    No tenderness or deformity   Heart:    Regular rate and rhythm, S1 and S2 normal, no murmur, rub   or gallop  Breast Exam:    No tenderness, masses, or nipple abnormality  Abdomen:     Soft, non-tender, bowel sounds active all four quadrants,    no masses, no organomegaly  Genitalia:    deferred  Rectal:    Extremities:   Extremities normal, atraumatic, no cyanosis or edema  Pulses:   2+ and symmetric all extremities  Skin:   Skin color, texture, turgor normal, no rashes or lesions  Lymph nodes:   Cervical, supraclavicular, and axillary nodes normal  Neurologic:   CNII-XII intact, normal strength, sensation and reflexes    throughout          Assessment & Plan:

## 2012-06-21 NOTE — Patient Instructions (Addendum)
Follow up in 3-4 months to recheck diabetes We'll notify you of your lab results and make any changes if needed Keep up the good work! Call with any questions or concerns Happy Sandra Ray!!!

## 2012-06-21 NOTE — Assessment & Plan Note (Signed)
Chronic problem.  CBGs are excellent.  UTD on eye exam.  On ACE for renal protection.  Check labs.  Adjust meds prn

## 2012-07-12 ENCOUNTER — Ambulatory Visit: Payer: Medicare Other

## 2012-07-24 ENCOUNTER — Encounter: Payer: Self-pay | Admitting: Family Medicine

## 2012-07-24 ENCOUNTER — Ambulatory Visit (INDEPENDENT_AMBULATORY_CARE_PROVIDER_SITE_OTHER): Payer: Medicare Other | Admitting: Family Medicine

## 2012-07-24 VITALS — BP 120/60 | HR 104 | Temp 98.5°F | Ht 69.25 in | Wt 212.2 lb

## 2012-07-24 DIAGNOSIS — J019 Acute sinusitis, unspecified: Secondary | ICD-10-CM

## 2012-07-24 MED ORDER — BENZONATATE 100 MG PO CAPS: 100.0000 mg | ORAL_CAPSULE | Freq: Three times a day (TID) | ORAL | Status: DC | PRN

## 2012-07-24 MED ORDER — AMOXICILLIN 875 MG PO TABS: 875.0000 mg | ORAL_TABLET | Freq: Two times a day (BID) | ORAL | Status: DC

## 2012-07-24 MED ORDER — PROMETHAZINE-DM 6.25-15 MG/5ML PO SYRP: 5.0000 mL | ORAL_SOLUTION | Freq: Four times a day (QID) | ORAL | Status: DC | PRN

## 2012-07-24 NOTE — Patient Instructions (Addendum)
Start the Amox twice daily- take w/ food Use the Tessalon for daytime cough- won't cause drowsiness Use the cough syrup at night or when not driving- will cause drowsiness Drink plenty of fluids REST! Hang in there!

## 2012-07-24 NOTE — Assessment & Plan Note (Signed)
Pt's sxs and PE consistent w/ infxn.  Start abx.  Cough meds prn.  Reviewed supportive care and red flags that should prompt return.  Pt expressed understanding and is in agreement w/ plan.  

## 2012-07-24 NOTE — Progress Notes (Signed)
  Subjective:    Patient ID: Sandra Ray, female    DOB: 28-Dec-1945, 67 y.o.   MRN: [id number]  HPI URI- sxs started 3+ weeks ago.  Had sxs x2 weeks and then thought she was better.  sxs again returned on Saturday but more severe than previous.  + nasal congestion, PND, chest tightness, cough- productive of 'nasty yellow'.  + frontal and maxillary sinus pain.  No fever.  No known sick contacts.  No N/V/D.  No ear pain.  Pt at higher risk for infxn due to DM.   Review of Systems For ROS see HPI     Objective:   Physical Exam  Vitals reviewed. Constitutional: She appears well-developed and well-nourished. No distress.  HENT:  Head: Normocephalic and atraumatic.  Right Ear: Tympanic membrane normal.  Left Ear: Tympanic membrane normal.  Nose: Mucosal edema and rhinorrhea present. Right sinus exhibits maxillary sinus tenderness and frontal sinus tenderness. Left sinus exhibits maxillary sinus tenderness and frontal sinus tenderness.  Mouth/Throat: Uvula is midline and mucous membranes are normal. Posterior oropharyngeal erythema present. No oropharyngeal exudate.  Eyes: Conjunctivae and EOM are normal. Pupils are equal, round, and reactive to light.  Neck: Normal range of motion. Neck supple.  Cardiovascular: Normal rate, regular rhythm and normal heart sounds.   Pulmonary/Chest: Effort normal and breath sounds normal. No respiratory distress. She has no wheezes.  Lymphadenopathy:    She has no cervical adenopathy.          Assessment & Plan:

## 2012-07-25 ENCOUNTER — Ambulatory Visit: Payer: Medicare Other

## 2012-08-01 ENCOUNTER — Ambulatory Visit: Payer: Medicare Other

## 2012-08-02 ENCOUNTER — Ambulatory Visit: Payer: Medicare Other

## 2012-09-16 ENCOUNTER — Other Ambulatory Visit: Payer: Self-pay | Admitting: Family Medicine

## 2012-09-16 MED ORDER — SITAGLIPTIN PHOS-METFORMIN HCL 50-1000 MG PO TABS: 1.0000 | ORAL_TABLET | Freq: Two times a day (BID) | ORAL | Status: DC

## 2012-09-19 ENCOUNTER — Ambulatory Visit
Admission: RE | Admit: 2012-09-19 | Discharge: 2012-09-19 | Disposition: A | Discharge status: Home / Self Care | Payer: Medicare Other | Source: Ambulatory Visit

## 2012-09-19 DIAGNOSIS — Z1231 Encounter for screening mammogram for malignant neoplasm of breast: Secondary | ICD-10-CM

## 2012-09-23 ENCOUNTER — Other Ambulatory Visit: Payer: Self-pay | Admitting: Family Medicine

## 2012-09-23 DIAGNOSIS — R928 Other abnormal and inconclusive findings on diagnostic imaging of breast: Secondary | ICD-10-CM

## 2012-10-02 ENCOUNTER — Ambulatory Visit
Admission: RE | Admit: 2012-10-02 | Discharge: 2012-10-02 | Disposition: A | Discharge status: Home / Self Care | Payer: Medicare Other | Source: Ambulatory Visit | Attending: Family Medicine | Admitting: Family Medicine

## 2012-10-02 ENCOUNTER — Other Ambulatory Visit: Payer: Self-pay | Admitting: Family Medicine

## 2012-10-02 DIAGNOSIS — R928 Other abnormal and inconclusive findings on diagnostic imaging of breast: Secondary | ICD-10-CM

## 2012-10-18 ENCOUNTER — Ambulatory Visit: Payer: Medicare Other | Admitting: Family Medicine

## 2012-10-21 ENCOUNTER — Ambulatory Visit
Admission: RE | Admit: 2012-10-21 | Discharge: 2012-10-21 | Disposition: A | Discharge status: Home / Self Care | Payer: Medicare Other | Source: Ambulatory Visit | Attending: Family Medicine | Admitting: Family Medicine

## 2012-10-21 DIAGNOSIS — R928 Other abnormal and inconclusive findings on diagnostic imaging of breast: Secondary | ICD-10-CM

## 2012-10-24 ENCOUNTER — Ambulatory Visit (INDEPENDENT_AMBULATORY_CARE_PROVIDER_SITE_OTHER): Payer: Medicare Other | Admitting: Family Medicine

## 2012-10-24 ENCOUNTER — Encounter: Payer: Self-pay | Admitting: Family Medicine

## 2012-10-24 VITALS — BP 128/78 | HR 76 | Temp 97.6°F | Ht 69.25 in | Wt 212.0 lb

## 2012-10-24 DIAGNOSIS — E1149 Type 2 diabetes mellitus with other diabetic neurological complication: Secondary | ICD-10-CM

## 2012-10-24 DIAGNOSIS — L259 Unspecified contact dermatitis, unspecified cause: Secondary | ICD-10-CM

## 2012-10-24 DIAGNOSIS — E1140 Type 2 diabetes mellitus with diabetic neuropathy, unspecified: Secondary | ICD-10-CM

## 2012-10-24 DIAGNOSIS — E1142 Type 2 diabetes mellitus with diabetic polyneuropathy: Secondary | ICD-10-CM

## 2012-10-24 DIAGNOSIS — L309 Dermatitis, unspecified: Secondary | ICD-10-CM

## 2012-10-24 DIAGNOSIS — Z9889 Other specified postprocedural states: Secondary | ICD-10-CM | POA: Insufficient documentation

## 2012-10-24 LAB — BASIC METABOLIC PANEL
GFR: 80.62 mL/min (ref >60.00)
Potassium: 3.9 mEq/L (ref 3.5–5.1)
Sodium: 137 mEq/L (ref 135–145)

## 2012-10-24 MED ORDER — SITAGLIPTIN PHOS-METFORMIN HCL 50-1000 MG PO TABS: 1.0000 | ORAL_TABLET | Freq: Two times a day (BID) | ORAL | Status: DC

## 2012-10-24 MED ORDER — TRIAMCINOLONE ACETONIDE 0.1 % EX OINT: TOPICAL_OINTMENT | Freq: Two times a day (BID) | CUTANEOUS | Status: DC

## 2012-10-24 MED ORDER — GABAPENTIN 300 MG PO CAPS: 300.0000 mg | ORAL_CAPSULE | Freq: Three times a day (TID) | ORAL | Status: DC

## 2012-10-24 MED ORDER — FENOFIBRATE MICRONIZED 130 MG PO CAPS: 130.0000 mg | ORAL_CAPSULE | Freq: Every day | ORAL | Status: DC

## 2012-10-24 NOTE — Assessment & Plan Note (Signed)
New.  Hematoma present.  No evidence of infxn.  Encouraged ice/ heat for pain relief.  Will follow.

## 2012-10-24 NOTE — Progress Notes (Signed)
  Subjective:    Patient ID: Sandra Ray, female    DOB: 21-Dec-1945, 67 y.o.   MRN: [id number]  HPI DM- chronic problem, on Janumet, Glipizide.  On ACE for renal protection.  UTD on eye exam.  No symptomatic lows.  Denies CP, SOB, HAs, visual changes, no N/V/D.  Breast biopsy- L breast, bx done 8/11.  Now w/ large bruising.  + discomfort.  No drainage.  No warmth.  Hand eczema- was using OTC hydrocortisone w/out relief.  Friend gave her prescription Clobetasol spray.  Asking for prescription ointment.   Review of Systems For ROS see HPI     Objective:   Physical Exam  Vitals reviewed. Constitutional: She is oriented to person, place, and time. She appears well-developed and well-nourished. No distress.  HENT:  Head: Normocephalic and atraumatic.  Eyes: Conjunctivae and EOM are normal. Pupils are equal, round, and reactive to light.  Neck: Normal range of motion. Neck supple. No thyromegaly present.  Cardiovascular: Normal rate, regular rhythm, normal heart sounds and intact distal pulses.   No murmur heard. Pulmonary/Chest: Effort normal and breath sounds normal. No respiratory distress. Left breast exhibits skin change (large hematoma at site of bx w/out surrounding erythema, warmth, drainage) and tenderness. Left breast exhibits no inverted nipple.  Abdominal: Soft. She exhibits no distension. There is no tenderness.  Musculoskeletal: She exhibits no edema.  Lymphadenopathy:    She has no cervical adenopathy.  Neurological: She is alert and oriented to person, place, and time.  Skin: Skin is warm and dry.  Scaling eczema on R thumb  Psychiatric: She has a normal mood and affect. Her behavior is normal.          Assessment & Plan:

## 2012-10-24 NOTE — Patient Instructions (Addendum)
Follow up in 3-4 months to recheck cholesterol and diabetes We'll notify you of your lab results and make any changes if needed Alternate heat/ice on the breast bruise Apply the Triamcinolone ointment twice daily Call with any questions or concerns Hang in there!!!

## 2012-10-24 NOTE — Assessment & Plan Note (Signed)
Chronic problem.  Typically well controlled.  Currently asymptomatic w/ exception of known neuropathy (more back surgery related than diabetes related).  Pt realized she's overdue on eye exam- plans to schedule.  Check labs.  Adjust meds prn

## 2012-10-24 NOTE — Assessment & Plan Note (Signed)
Unchanged.  Start prescription strength steroid ointment.  Will follow.

## 2012-10-25 ENCOUNTER — Ambulatory Visit: Payer: Medicare Other | Admitting: Family Medicine

## 2012-10-30 ENCOUNTER — Telehealth: Payer: Self-pay

## 2012-10-30 MED ORDER — CLOBETASOL PROPIONATE 0.05 % EX LOTN: 1.0000 "application " | TOPICAL_LOTION | Freq: Two times a day (BID) | CUTANEOUS | Status: DC

## 2012-10-30 NOTE — Telephone Encounter (Signed)
Message left on triage voicemail: Patient was seen last week and hand is not improving, it is actually worse. Please call  Spoke with patient, patient indicated she was seen concerning her right:  hand is dry, peeling, cracking, and painful, patient is now having the same concerns with her left hand (thumbs are the worst on both hands). Patient states that the ointment rx'ed at appointment is actually making her hands worse. Patient would like to know if Dr.Tabori will rx Clobex spray to [facility] (on file)   Please advise (last OV 10/24/12)

## 2012-10-30 NOTE — Telephone Encounter (Signed)
Can switch from Triamcinolone to Clobex lotion 0.5% - apply sparingly BID

## 2012-10-30 NOTE — Telephone Encounter (Signed)
Medication was changed on pt chart. Pt notified of this change, meds sent to pharmacy.

## 2012-11-01 ENCOUNTER — Telehealth: Payer: Self-pay | Admitting: Family Medicine

## 2012-11-01 NOTE — Telephone Encounter (Signed)
Patient states that she is taking 3 medications that list side effects "peeling, redness and blistering of hands." Wants a call back to discuss this because she is having these symptoms and addressed this issue at her last appointment on 10/24/12. Please advise.

## 2012-11-08 NOTE — Telephone Encounter (Signed)
Spoke with pt today. She states that she has been having the peeling, redness, and itching of her hands since December. Pt recently received refills of : Fenofibrate, gabapentin, and simvastatin. Would like to know if one of these would likely be the cause due to those symptoms being listed on the warnings given to her from the pharmacy. Please advise.

## 2012-11-08 NOTE — Telephone Encounter (Signed)
Very unlikely that any of these meds would cause her symptoms b/c it is only occuring in a limited area if i'm remembering correctly- these meds would cause diffuse redness and peeling

## 2012-11-08 NOTE — Telephone Encounter (Signed)
Pt.notified

## 2012-12-18 ENCOUNTER — Ambulatory Visit (INDEPENDENT_AMBULATORY_CARE_PROVIDER_SITE_OTHER): Payer: Medicare Other | Admitting: General Practice

## 2012-12-18 DIAGNOSIS — Z23 Encounter for immunization: Secondary | ICD-10-CM

## 2012-12-19 ENCOUNTER — Ambulatory Visit: Payer: Medicare Other

## 2013-01-13 ENCOUNTER — Telehealth: Payer: Self-pay | Admitting: Family Medicine

## 2013-01-13 DIAGNOSIS — E876 Hypokalemia: Secondary | ICD-10-CM

## 2013-01-13 DIAGNOSIS — I10 Essential (primary) hypertension: Secondary | ICD-10-CM

## 2013-01-13 MED ORDER — GABAPENTIN 300 MG PO CAPS: 300.0000 mg | ORAL_CAPSULE | Freq: Three times a day (TID) | ORAL | Status: DC

## 2013-01-13 MED ORDER — POTASSIUM CHLORIDE CRYS ER 20 MEQ PO TBCR: 20.0000 meq | EXTENDED_RELEASE_TABLET | Freq: Every day | ORAL | Status: DC

## 2013-01-13 MED ORDER — GLIPIZIDE 10 MG PO TABS: 20.0000 mg | ORAL_TABLET | Freq: Two times a day (BID) | ORAL | Status: DC

## 2013-01-13 MED ORDER — LISINOPRIL 5 MG PO TABS: 5.0000 mg | ORAL_TABLET | Freq: Every day | ORAL | Status: DC

## 2013-01-13 NOTE — Telephone Encounter (Addendum)
Patient called and request refill's for  glipizide (glucotrol) 10mg  tablet,  gabapentin (neurontin) 300 mg capsule (30 day supply),  potassium chloride sa (k-dur,klor=con) 20 meq tablet   lisinopril (prinivil,zestril) 5 mg tablet (30 day supply). Patient stated that she usually gets them refilled with [facility] but she needs them refill as soon as possible. Also patient wanted to see if we had samples of  Janumet 50-1000mg . Thanks     Pharmacy [facility] [address] Ginette Otto

## 2013-01-13 NOTE — Telephone Encounter (Signed)
Ok for samples if available 

## 2013-01-13 NOTE — Telephone Encounter (Signed)
Meds filled to local pharmacy for only 30 days per pt's request. Did not send to mail order at this time. Pt did not wasn't them sent there.    Please advise if ok for janumet samples.

## 2013-01-13 NOTE — Telephone Encounter (Signed)
Med filled, samples given.

## 2013-01-17 MED ORDER — GLIPIZIDE 10 MG PO TABS: 20.0000 mg | ORAL_TABLET | Freq: Two times a day (BID) | ORAL | Status: DC

## 2013-01-17 NOTE — Telephone Encounter (Signed)
Med filled and pt notified.  

## 2013-01-17 NOTE — Telephone Encounter (Signed)
Patient called and stated that her glipiZIDE (GLUCOTROL) 10 MG tablet was called into the pharmacy but it was called in for only 60 tablets and she takes 4 a day. Patient states that will only last her for 15 days and wanted to see could we call in a 60 day supply for her. Thanks    Pharmacy [facility] dr Ginette Otto

## 2013-02-11 ENCOUNTER — Telehealth: Payer: Self-pay | Admitting: Family Medicine

## 2013-02-11 NOTE — Telephone Encounter (Signed)
Ok for samples if we have them 

## 2013-02-11 NOTE — Telephone Encounter (Signed)
Pt notified samples placed at the front desk.

## 2013-02-11 NOTE — Telephone Encounter (Signed)
Last OV 8-14 (hand eczema), A1c drawn same day. Ok for samples if we have them

## 2013-02-11 NOTE — Telephone Encounter (Signed)
Patient called and wanted to see if dr Beverely Low had any samples of sitaGLIPtan-metformin (JANUMET) 50-1000 MG per tablet

## 2013-02-18 ENCOUNTER — Encounter: Payer: Self-pay | Admitting: Family Medicine

## 2013-02-18 ENCOUNTER — Ambulatory Visit (INDEPENDENT_AMBULATORY_CARE_PROVIDER_SITE_OTHER): Payer: Medicare Other | Admitting: Family Medicine

## 2013-02-18 VITALS — BP 142/80 | HR 84 | Temp 97.7°F | Ht 69.25 in | Wt 211.2 lb

## 2013-02-18 DIAGNOSIS — E1149 Type 2 diabetes mellitus with other diabetic neurological complication: Secondary | ICD-10-CM

## 2013-02-18 DIAGNOSIS — I10 Essential (primary) hypertension: Secondary | ICD-10-CM

## 2013-02-18 DIAGNOSIS — E1142 Type 2 diabetes mellitus with diabetic polyneuropathy: Secondary | ICD-10-CM

## 2013-02-18 DIAGNOSIS — E1140 Type 2 diabetes mellitus with diabetic neuropathy, unspecified: Secondary | ICD-10-CM

## 2013-02-18 DIAGNOSIS — E785 Hyperlipidemia, unspecified: Secondary | ICD-10-CM

## 2013-02-18 LAB — HEPATIC FUNCTION PANEL
ALT: 26 U/L (ref 0–35)
AST: 33 U/L (ref 0–37)
Bilirubin, Direct: 0 mg/dL (ref 0.0–0.3)
Total Bilirubin: 0.5 mg/dL (ref 0.3–1.2)

## 2013-02-18 LAB — HEMOGLOBIN A1C: Hgb A1c MFr Bld: 8.1 % — ABNORMAL HIGH (ref 4.6–6.5)

## 2013-02-18 LAB — BASIC METABOLIC PANEL
Chloride: 101 mEq/L (ref 96–112)
Potassium: 3.8 mEq/L (ref 3.5–5.1)
Sodium: 135 mEq/L (ref 135–145)

## 2013-02-18 LAB — LIPID PANEL
LDL Cholesterol: 77 mg/dL (ref 0–99)
Total CHOL/HDL Ratio: 4

## 2013-02-18 NOTE — Progress Notes (Signed)
Pre visit review using our clinic review tool, if applicable. No additional management support is needed unless otherwise documented below in the visit note. 

## 2013-02-18 NOTE — Assessment & Plan Note (Signed)
Chronic problem.  Elevated today but pt hasn't taken meds.  Will not make changes based on today's reading when it has been so good previously.

## 2013-02-18 NOTE — Progress Notes (Signed)
   Subjective:    Patient ID: Sandra Ray, female    DOB: 1945/06/20, 67 y.o.   MRN: [id number]  HPI Pre visit review using our clinic review tool, if applicable. No additional management support is needed unless otherwise documented below in the visit note.  DM- chronic problem, on Glipizide, Janumet.  Overdue on eye exam, plans to schedule.  Denies symptomatic lows.  No N/V/D.  Numbness has improved on gabapentin.  HTN- chronic problem, on Lisinopril, HCTZ.  Has not had meds this AM.  No CP, SOB, HAs, visual changes, edema.  Hyperlipidemia- chronic problem, on Zocor 40mg , fenofibrate 130mg .  No abd pain, N/V, myalgias   Review of Systems For ROS see HPI     Objective:   Physical Exam  Vitals reviewed. Constitutional: She is oriented to person, place, and time. She appears well-developed and well-nourished. No distress.  HENT:  Head: Normocephalic and atraumatic.  Eyes: Conjunctivae and EOM are normal. Pupils are equal, round, and reactive to light.  Neck: Normal range of motion. Neck supple. No thyromegaly present.  Cardiovascular: Normal rate, regular rhythm, normal heart sounds and intact distal pulses.   No murmur heard. Pulmonary/Chest: Effort normal and breath sounds normal. No respiratory distress.  Abdominal: Soft. She exhibits no distension. There is no tenderness.  Musculoskeletal: She exhibits no edema.  Lymphadenopathy:    She has no cervical adenopathy.  Neurological: She is alert and oriented to person, place, and time.  Skin: Skin is warm and dry.  Psychiatric: She has a normal mood and affect. Her behavior is normal.          Assessment & Plan:

## 2013-02-18 NOTE — Assessment & Plan Note (Signed)
Chronic problem.  Asymptomatic.  Overdue on eye exam- encouraged her to schedule.  Check labs.  Adjust meds prn

## 2013-02-18 NOTE — Assessment & Plan Note (Signed)
Chronic problem.  Asymptomatic.  Check labs.  Adjust meds prn  

## 2013-02-18 NOTE — Patient Instructions (Signed)
Schedule your complete physical for after 4/11 We'll notify you of your lab results and make any changes if needed Keep up the good work!  You look great! Continue the good work on healthy diet and regular exercise Call with any questions or concerns Happy Holidays!!!

## 2013-03-14 ENCOUNTER — Telehealth: Payer: Self-pay | Admitting: Family Medicine

## 2013-03-14 DIAGNOSIS — E7800 Pure hypercholesterolemia, unspecified: Secondary | ICD-10-CM

## 2013-03-14 DIAGNOSIS — I10 Essential (primary) hypertension: Secondary | ICD-10-CM

## 2013-03-14 MED ORDER — PROMETHAZINE HCL 25 MG PO TABS: 25.0000 mg | ORAL_TABLET | Freq: Four times a day (QID) | ORAL | Status: DC | PRN

## 2013-03-14 MED ORDER — GLIPIZIDE 10 MG PO TABS: 20.0000 mg | ORAL_TABLET | Freq: Two times a day (BID) | ORAL | Status: DC

## 2013-03-14 MED ORDER — GABAPENTIN 300 MG PO CAPS: 300.0000 mg | ORAL_CAPSULE | Freq: Three times a day (TID) | ORAL | Status: DC

## 2013-03-14 MED ORDER — HYDROCHLOROTHIAZIDE 25 MG PO TABS: 25.0000 mg | ORAL_TABLET | Freq: Every day | ORAL | Status: DC

## 2013-03-14 MED ORDER — SIMVASTATIN 40 MG PO TABS: 40.0000 mg | ORAL_TABLET | Freq: Every day | ORAL | Status: DC

## 2013-03-14 MED ORDER — FENOFIBRATE MICRONIZED 130 MG PO CAPS: 130.0000 mg | ORAL_CAPSULE | Freq: Every day | ORAL | Status: DC

## 2013-03-14 MED ORDER — CLOBETASOL PROPIONATE 0.05 % EX LOTN: 1.0000 "application " | TOPICAL_LOTION | Freq: Two times a day (BID) | CUTANEOUS | Status: DC

## 2013-03-14 MED ORDER — LISINOPRIL 5 MG PO TABS: 5.0000 mg | ORAL_TABLET | Freq: Every day | ORAL | Status: DC

## 2013-03-14 MED ORDER — SITAGLIPTIN PHOS-METFORMIN HCL 50-1000 MG PO TABS: 1.0000 | ORAL_TABLET | Freq: Two times a day (BID) | ORAL | Status: DC

## 2013-03-14 NOTE — Telephone Encounter (Signed)
Patient needs new prescriptions for all of her medications sent to [facility] on [address]. She has a new insurance and no longer uses [facility]. Please advise.

## 2013-03-14 NOTE — Telephone Encounter (Signed)
meds filled to local pharmacy as requested.

## 2013-03-19 ENCOUNTER — Telehealth: Payer: Self-pay | Admitting: *Deleted

## 2013-03-19 DIAGNOSIS — E876 Hypokalemia: Secondary | ICD-10-CM

## 2013-03-19 MED ORDER — POTASSIUM CHLORIDE CRYS ER 20 MEQ PO TBCR: 20.0000 meq | EXTENDED_RELEASE_TABLET | Freq: Every day | ORAL | Status: DC

## 2013-03-19 NOTE — Telephone Encounter (Addendum)
Patient called and requested a refill for potassium chloride SA (K-DUR,KLOR-CON) 20 MEQ tablet 90 day supply  Pharmacy [id number] [address] [place]

## 2013-03-19 NOTE — Telephone Encounter (Signed)
Med filled.  

## 2013-04-07 ENCOUNTER — Encounter: Payer: Self-pay | Admitting: Family Medicine

## 2013-04-07 ENCOUNTER — Ambulatory Visit (INDEPENDENT_AMBULATORY_CARE_PROVIDER_SITE_OTHER): Payer: Medicare Other | Admitting: Family Medicine

## 2013-04-07 VITALS — BP 140/68 | HR 99 | Temp 98.2°F | Resp 16 | Wt 212.0 lb

## 2013-04-07 DIAGNOSIS — L02211 Cutaneous abscess of abdominal wall: Secondary | ICD-10-CM | POA: Insufficient documentation

## 2013-04-07 DIAGNOSIS — L02219 Cutaneous abscess of trunk, unspecified: Secondary | ICD-10-CM

## 2013-04-07 DIAGNOSIS — L03319 Cellulitis of trunk, unspecified: Secondary | ICD-10-CM

## 2013-04-07 HISTORY — DX: Cutaneous abscess of abdominal wall: L02.211

## 2013-04-07 MED ORDER — DOXYCYCLINE HYCLATE 100 MG PO TABS: 100.0000 mg | ORAL_TABLET | Freq: Two times a day (BID) | ORAL | Status: DC

## 2013-04-07 NOTE — Patient Instructions (Signed)
Follow up as needed Start the Doxycycline twice daily- take w/ food Hot compresses Keep area clean and covered- NO TAPE! Call with any questions or concerns Hang in there!!!

## 2013-04-07 NOTE — Assessment & Plan Note (Signed)
New.  Severe.  Will need surgical intervention to drain and pack.  Start Doxy to cover for possible MRSA.  Reviewed supportive care and red flags that should prompt return.  Pt expressed understanding and is in agreement w/ plan.

## 2013-04-07 NOTE — Progress Notes (Signed)
Pre visit review using our clinic review tool, if applicable. No additional management support is needed unless otherwise documented below in the visit note. 

## 2013-04-07 NOTE — Progress Notes (Signed)
   Subjective:    Patient ID: Sandra Ray, female    DOB: 06-22-1945, 68 y.o.   MRN: [id number]  HPI Abscess- last week developed redness and swelling.  Some foul smelling drainage.  Now w/ secondary reaction to gauze pad.  Very painful.   Review of Systems For ROS see HPI     Objective:   Physical Exam  Vitals reviewed. Constitutional: She appears well-developed and well-nourished. No distress.  Skin: Skin is warm and dry. There is erythema (2.5 cm central indurated area w/ pustular core, surrounded by 8 cm of erythema and induration w/ secondary tape reaction around the margins).          Assessment & Plan:

## 2013-04-08 ENCOUNTER — Encounter (INDEPENDENT_AMBULATORY_CARE_PROVIDER_SITE_OTHER): Payer: Self-pay | Admitting: Surgery

## 2013-04-08 ENCOUNTER — Other Ambulatory Visit (INDEPENDENT_AMBULATORY_CARE_PROVIDER_SITE_OTHER): Payer: Self-pay

## 2013-04-08 ENCOUNTER — Ambulatory Visit (INDEPENDENT_AMBULATORY_CARE_PROVIDER_SITE_OTHER): Payer: Medicare Other | Admitting: Surgery

## 2013-04-08 VITALS — BP 138/87 | HR 83 | Temp 99.8°F | Resp 16 | Ht 69.0 in | Wt 209.4 lb

## 2013-04-08 DIAGNOSIS — L309 Dermatitis, unspecified: Secondary | ICD-10-CM

## 2013-04-08 DIAGNOSIS — IMO0002 Reserved for concepts with insufficient information to code with codable children: Secondary | ICD-10-CM

## 2013-04-08 DIAGNOSIS — L02219 Cutaneous abscess of trunk, unspecified: Secondary | ICD-10-CM | POA: Diagnosis not present

## 2013-04-08 DIAGNOSIS — K668 Other specified disorders of peritoneum: Secondary | ICD-10-CM | POA: Diagnosis not present

## 2013-04-08 DIAGNOSIS — L03319 Cellulitis of trunk, unspecified: Secondary | ICD-10-CM | POA: Diagnosis not present

## 2013-04-08 DIAGNOSIS — L259 Unspecified contact dermatitis, unspecified cause: Secondary | ICD-10-CM

## 2013-04-08 DIAGNOSIS — L02211 Cutaneous abscess of abdominal wall: Secondary | ICD-10-CM

## 2013-04-08 NOTE — Progress Notes (Addendum)
[place], MD,  [address]  [place], [place]    [place] Phone:  [phone] FAX:  [phone]   Re:   Sandra Ray DOB:   December 05, 1945 MRN:   [id number]  Urgent Office  ASSESSMENT AND PLAN: 1.  Right upper quadrant abscess  I&D in office today (04/08/2013)   Plan: continue doxycyline, clean wound BID, see me back in one week.  2.  Diabetes mellitus x 19 years 3.  HTN x 40 years 4.  Hyperlipidemia 5.  Degenerative disk disease 6.  History of 3 back surgeries - the last wsa Aug 2011 - Dr. Rolena Infante.  Do okay now.  HISTORY OF PRESENT ILLNESS: Chief Complaint  Patient presents with  . Other  . Pain   Sandra Ray is a 68 y.o. (DOB: Nov 07, 1945)  white  female who is a patient of Annye Asa, MD and comes to me today for right upper abdominal wall abscess.  The patient first noticed something wrong last Wed/Thurs (1/21-1/22).  She saw Dr. Birdie Riddle yesterday, who put her on Doxycycline, and made her appt for today in our urgent office.  She has no prior history of skin infections.  She is diabetic.  Past Medical History  Diagnosis Date  . Diabetes mellitus type II   . Hyperlipidemia   . Hypertension   . Degenerative disc disease    Current Outpatient Prescriptions  Medication Sig Dispense Refill  . acetaminophen (TYLENOL) 650 MG CR tablet Take 1,300 mg by mouth daily.       Marland Kitchen albuterol (VENTOLIN HFA) 108 (90 BASE) MCG/ACT inhaler Inhale 2 puffs into the lungs every 6 (six) hours as needed.        . Ascorbic Acid (VITAMIN C) 500 MG tablet Take 500 mg by mouth daily.        Marland Kitchen aspirin 81 MG tablet Take 81 mg by mouth daily.        . Cholecalciferol (VITAMIN D-3) 5000 UNITS TABS Take by mouth daily.       . Clobetasol Propionate 0.05 % lotion Apply 1 application topically 2 (two) times daily.  120 mL  0  . doxycycline (VIBRA-TABS) 100 MG tablet Take 1 tablet (100 mg total) by mouth 2 (two) times daily.  20 tablet  0    . fenofibrate micronized (ANTARA) 130 MG capsule Take 1 capsule (130 mg total) by mouth daily.  90 capsule  1  . gabapentin (NEURONTIN) 300 MG capsule Take 1 capsule (300 mg total) by mouth 3 (three) times daily.  90 capsule  0  . glipiZIDE (GLUCOTROL) 10 MG tablet Take 2 tablets (20 mg total) by mouth 2 (two) times daily before a meal.  240 tablet  0  . hydrochlorothiazide (HYDRODIURIL) 25 MG tablet Take 1 tablet (25 mg total) by mouth daily.  90 tablet  2  . lisinopril (PRINIVIL,ZESTRIL) 5 MG tablet Take 1 tablet (5 mg total) by mouth daily.  30 tablet  0  . Multiple Vitamin (MULTIVITAMIN) tablet Take 1 tablet by mouth daily.        . potassium chloride SA (K-DUR,KLOR-CON) 20 MEQ tablet Take 1 tablet (20 mEq total) by mouth daily.  90 tablet  1  . promethazine (PHENERGAN) 25 MG tablet Take 1 tablet (25 mg total) by mouth 4 (four) times daily as needed. nausea  90 tablet  1  . simvastatin (ZOCOR) 40 MG tablet Take 1 tablet (40 mg total) by mouth  at bedtime.  90 tablet  3  . sitaGLIPtin-metformin (JANUMET) 50-1000 MG per tablet Take 1 tablet by mouth 2 (two) times daily.  180 tablet  0  . vitamin E 400 UNIT capsule Take 400 Units by mouth daily.         No current facility-administered medications for this visit.    SOCIAL HISTORY: Married.  Husband with patient.  PHYSICAL EXAM: BP 138/87  Pulse 83  Temp(Src) 99.8 F (37.7 C) (Temporal)  Resp 16  Ht 5\' 9"  (1.753 m)  Wt 209 lb 6.4 oz (94.983 kg)  BMI 30.91 kg/m2  Abdomen:  RUQ 3-4 cm area of cellulitis/abscess.   Abscess of RUQ abdominal wall (right side down)  (I don't know how to rotate the picture)  Procedure:  While in office.  I cleaned the area with Chloroprep.  Infiltrated 10 cc of 1% xylocaine.  And drained the abscess.  Cultures were obtained.  I packed the wound with damp gauze and sterilely dressed it.  DATA REVIEWED: Notes from Dr. Verne Spurr.   Alphonsa Overall, MD,  [facility], [address]  [place], [place]    [place] Phone:  [phone] FAX:  [phone]

## 2013-04-12 LAB — WOUND CULTURE: Gram Stain: NONE SEEN

## 2013-04-14 ENCOUNTER — Telehealth: Payer: Self-pay

## 2013-04-14 NOTE — Telephone Encounter (Signed)
Patient was seen last Monday by Dr. Birdie Riddle for abscess on abdominal wall.  Dr. Birdie Riddle suggested surgery.  Everything has been going good with the exception of yesterday and today.  States she has been breaking out in blisters on abdomen near incision that are increasing in number and size.  The blister itch and burn and "it feels like the little nerves are carrying on."  Patient was advised to be seen.  Appt. was set for Thursday, Feb. 5th at 130pm.

## 2013-04-14 NOTE — Telephone Encounter (Signed)
Pt should call [facility] since she has been seeing them. If they have nothing to offer her, we need to see her sooner than 2/5 (maybe 9:45 tomorrow) as this may be shingles and she needs to start meds

## 2013-04-14 NOTE — Telephone Encounter (Signed)
Pt scheduled for tomorrow at 945

## 2013-04-15 ENCOUNTER — Ambulatory Visit (INDEPENDENT_AMBULATORY_CARE_PROVIDER_SITE_OTHER): Payer: Medicare Other | Admitting: Family Medicine

## 2013-04-15 ENCOUNTER — Encounter: Payer: Self-pay | Admitting: Family Medicine

## 2013-04-15 VITALS — BP 130/72 | HR 80 | Temp 98.2°F | Resp 16 | Wt 208.2 lb

## 2013-04-15 DIAGNOSIS — B029 Zoster without complications: Secondary | ICD-10-CM

## 2013-04-15 DIAGNOSIS — Z8619 Personal history of other infectious and parasitic diseases: Secondary | ICD-10-CM | POA: Insufficient documentation

## 2013-04-15 MED ORDER — VALACYCLOVIR HCL 1 G PO TABS: 1000.0000 mg | ORAL_TABLET | Freq: Three times a day (TID) | ORAL | Status: DC

## 2013-04-15 NOTE — Progress Notes (Signed)
Pre visit review using our clinic review tool, if applicable. No additional management support is needed unless otherwise documented below in the visit note. 

## 2013-04-15 NOTE — Assessment & Plan Note (Signed)
New.  Pt's sxs and rash consistent w/ dx.  Start Valtrex.  Offered pain meds- pt declined.  Encouraged her to call back if she changes her mind.  Reviewed supportive care and red flags that should prompt return.

## 2013-04-15 NOTE — Patient Instructions (Signed)
Follow up as needed Start the Valtrex 3x/day for the shingles Finish the Doxy as directed Call with any questions or concerns Hang in there!!!

## 2013-04-15 NOTE — Progress Notes (Signed)
   Subjective:    Patient ID: Sandra Ray, female    DOB: [birth year], 68 y.o.   MRN: [id number]  HPI Blisters on abd- 1st appeared 2 days ago.  Painful to have clothes touching, has sensation of electrical shock across abd.  No lesions across midline.  Pt on Doxy for abscess.  No hx of shingles, has had vaccine.   Review of Systems For ROS see HPI     Objective:   Physical Exam  Vitals reviewed. Constitutional: She appears well-developed and well-nourished. No distress.  Skin: Skin is warm and dry. Rash (vesicular rash along R lower ribs, very tender to touch, lesions do not cross midline) noted. There is erythema.          Assessment & Plan:

## 2013-04-16 ENCOUNTER — Encounter (INDEPENDENT_AMBULATORY_CARE_PROVIDER_SITE_OTHER): Payer: Self-pay | Admitting: Surgery

## 2013-04-16 ENCOUNTER — Ambulatory Visit (INDEPENDENT_AMBULATORY_CARE_PROVIDER_SITE_OTHER): Payer: Medicare Other | Admitting: Surgery

## 2013-04-16 VITALS — BP 128/62 | HR 90 | Temp 98.0°F | Resp 18 | Ht 64.0 in | Wt 208.0 lb

## 2013-04-16 DIAGNOSIS — L03319 Cellulitis of trunk, unspecified: Secondary | ICD-10-CM

## 2013-04-16 DIAGNOSIS — L02211 Cutaneous abscess of abdominal wall: Secondary | ICD-10-CM

## 2013-04-16 DIAGNOSIS — L02219 Cutaneous abscess of trunk, unspecified: Secondary | ICD-10-CM

## 2013-04-16 NOTE — Progress Notes (Signed)
[place], MD,  [address]  [place], [place]    [place] Phone:  [phone] FAX:  [phone]   Re:   Sandra Ray DOB:   [birth year] MRN:   [id number]  ASSESSMENT AND PLAN: 1.  Right upper quadrant abscess  I&D in office (04/08/2013)  The wound is clean - down to 1 x 2 cm.  This should go on and heal.  I made this her last office visit.  I made this her last visit with me.  Her return is PRN.  2.  Shingles RUQ - near abscess    3.  Diabetes mellitus x 19 years 4.  HTN x 40 years 5.  Hyperlipidemia 6.  Degenerative disk disease 7.  History of 3 back surgeries - the last was Aug 2011 - Dr. Rolena Infante.  Do okay now.  HISTORY OF PRESENT ILLNESS: Chief Complaint  Patient presents with  . Routine Post Op    recheck abd.   Sandra Ray is a 68 y.o. (DOB: 03/13/1946)  white  female who is a patient of Annye Asa, MD and comes to me today for follow up of a right upper abdominal wall abscess.  She has done well. Her husband is with her.  The wound burns a little bit when she dresses it. She developed what she thinks is shingles just below the wound.  It is getting better. The cultures grew out a Diptheroids. She is diabetic.  Past Medical History  Diagnosis Date  . Diabetes mellitus type II   . Hyperlipidemia   . Hypertension   . Degenerative disc disease    Current Outpatient Prescriptions  Medication Sig Dispense Refill  . acetaminophen (TYLENOL) 650 MG CR tablet Take 1,300 mg by mouth daily.       Marland Kitchen albuterol (VENTOLIN HFA) 108 (90 BASE) MCG/ACT inhaler Inhale 2 puffs into the lungs every 6 (six) hours as needed.        . Ascorbic Acid (VITAMIN C) 500 MG tablet Take 500 mg by mouth daily.        Marland Kitchen aspirin 81 MG tablet Take 81 mg by mouth daily.        . Cholecalciferol (VITAMIN D-3) 5000 UNITS TABS Take by mouth daily.       . Clobetasol Propionate 0.05 % lotion Apply 1 application topically 2 (two) times  daily.  120 mL  0  . doxycycline (VIBRA-TABS) 100 MG tablet Take 1 tablet (100 mg total) by mouth 2 (two) times daily.  20 tablet  0  . fenofibrate micronized (ANTARA) 130 MG capsule Take 1 capsule (130 mg total) by mouth daily.  90 capsule  1  . gabapentin (NEURONTIN) 300 MG capsule Take 1 capsule (300 mg total) by mouth 3 (three) times daily.  90 capsule  0  . glipiZIDE (GLUCOTROL) 10 MG tablet Take 2 tablets (20 mg total) by mouth 2 (two) times daily before a meal.  240 tablet  0  . hydrochlorothiazide (HYDRODIURIL) 25 MG tablet Take 1 tablet (25 mg total) by mouth daily.  90 tablet  2  . lisinopril (PRINIVIL,ZESTRIL) 5 MG tablet Take 1 tablet (5 mg total) by mouth daily.  30 tablet  0  . Multiple Vitamin (MULTIVITAMIN) tablet Take 1 tablet by mouth daily.        . potassium chloride SA (K-DUR,KLOR-CON) 20 MEQ tablet Take 1 tablet (20 mEq total) by mouth daily.  90 tablet  1  .  promethazine (PHENERGAN) 25 MG tablet Take 1 tablet (25 mg total) by mouth 4 (four) times daily as needed. nausea  90 tablet  1  . simvastatin (ZOCOR) 40 MG tablet Take 1 tablet (40 mg total) by mouth at bedtime.  90 tablet  3  . sitaGLIPtin-metformin (JANUMET) 50-1000 MG per tablet Take 1 tablet by mouth 2 (two) times daily.  180 tablet  0  . valACYclovir (VALTREX) 1000 MG tablet Take 1 tablet (1,000 mg total) by mouth 3 (three) times daily.  21 tablet  0  . vitamin E 400 UNIT capsule Take 400 Units by mouth daily.         No current facility-administered medications for this visit.   SOCIAL HISTORY: Married.  Husband with patient.  PHYSICAL EXAM: BP 128/62  Pulse 90  Temp(Src) 98 F (36.7 C)  Resp 18  Ht 5\' 4"  (1.626 m)  Wt 208 lb (94.348 kg)  BMI 35.69 kg/m2  Abdomen:  RUQ 1 x 2 cm wound that is clean and superficial.  She has a healing rash below the wound that she thinks was shingles, but this is also getting better.  DATA REVIEWED: Epic notes.  Alphonsa Overall, MD,  [facility],  [address]  [place], [place]    [place] Phone:  [phone] FAX:  [phone]

## 2013-04-17 ENCOUNTER — Other Ambulatory Visit: Payer: Self-pay | Admitting: Family Medicine

## 2013-04-17 ENCOUNTER — Ambulatory Visit: Payer: Medicare Other | Admitting: Family Medicine

## 2013-04-18 NOTE — Telephone Encounter (Signed)
Med filled.  

## 2013-05-12 ENCOUNTER — Other Ambulatory Visit: Payer: Self-pay | Admitting: Family Medicine

## 2013-05-12 NOTE — Telephone Encounter (Signed)
Med filled.  

## 2013-05-20 ENCOUNTER — Other Ambulatory Visit: Payer: Self-pay | Admitting: Family Medicine

## 2013-05-20 NOTE — Telephone Encounter (Signed)
Med filled.  

## 2013-05-30 ENCOUNTER — Other Ambulatory Visit: Payer: Self-pay | Admitting: Family Medicine

## 2013-05-30 NOTE — Telephone Encounter (Signed)
Med filled.  

## 2013-06-09 ENCOUNTER — Emergency Department (HOSPITAL_COMMUNITY): Payer: Medicare Other

## 2013-06-09 ENCOUNTER — Encounter (HOSPITAL_COMMUNITY): Payer: Self-pay | Admitting: Emergency Medicine

## 2013-06-09 ENCOUNTER — Emergency Department (HOSPITAL_COMMUNITY)
Admission: EM | Admit: 2013-06-09 | Discharge: 2013-06-09 | Disposition: A | Discharge status: Home / Self Care | Payer: Medicare Other | Attending: Emergency Medicine | Admitting: Emergency Medicine

## 2013-06-09 DIAGNOSIS — E119 Type 2 diabetes mellitus without complications: Secondary | ICD-10-CM | POA: Diagnosis not present

## 2013-06-09 DIAGNOSIS — Z79899 Other long term (current) drug therapy: Secondary | ICD-10-CM | POA: Insufficient documentation

## 2013-06-09 DIAGNOSIS — I10 Essential (primary) hypertension: Secondary | ICD-10-CM | POA: Diagnosis not present

## 2013-06-09 DIAGNOSIS — E785 Hyperlipidemia, unspecified: Secondary | ICD-10-CM | POA: Diagnosis not present

## 2013-06-09 DIAGNOSIS — Z8739 Personal history of other diseases of the musculoskeletal system and connective tissue: Secondary | ICD-10-CM | POA: Insufficient documentation

## 2013-06-09 DIAGNOSIS — R079 Chest pain, unspecified: Secondary | ICD-10-CM | POA: Insufficient documentation

## 2013-06-09 DIAGNOSIS — Z7982 Long term (current) use of aspirin: Secondary | ICD-10-CM | POA: Diagnosis not present

## 2013-06-09 DIAGNOSIS — R918 Other nonspecific abnormal finding of lung field: Secondary | ICD-10-CM | POA: Diagnosis not present

## 2013-06-09 DIAGNOSIS — R0789 Other chest pain: Secondary | ICD-10-CM | POA: Diagnosis not present

## 2013-06-09 LAB — BASIC METABOLIC PANEL
BUN: 22 mg/dL (ref 6–23)
CHLORIDE: 100 meq/L (ref 96–112)
CO2: 24 meq/L (ref 19–32)
Calcium: 10.1 mg/dL (ref 8.4–10.5)
Creatinine, Ser: 0.78 mg/dL (ref 0.50–1.10)
GFR calc non Af Amer: 85 mL/min — ABNORMAL LOW (ref >90)
Glucose, Bld: 141 mg/dL — ABNORMAL HIGH (ref 70–99)
POTASSIUM: 4.1 meq/L (ref 3.7–5.3)
SODIUM: 140 meq/L (ref 137–147)

## 2013-06-09 LAB — I-STAT TROPONIN, ED: TROPONIN I, POC: 0 ng/mL (ref 0.00–0.08)

## 2013-06-09 LAB — CBC
HCT: 38.2 % (ref 36.0–46.0)
Hemoglobin: 12.9 g/dL (ref 12.0–15.0)
MCH: 30.8 pg (ref 26.0–34.0)
MCHC: 33.8 g/dL (ref 30.0–36.0)
MCV: 91.2 fL (ref 78.0–100.0)
PLATELETS: 321 10*3/uL (ref 150–400)
RBC: 4.19 MIL/uL (ref 3.87–5.11)
RDW: 14.4 % (ref 11.5–15.5)
WBC: 10.2 10*3/uL (ref 4.0–10.5)

## 2013-06-09 LAB — TROPONIN I: Troponin I: <0.3 ng/mL (ref <0.30)

## 2013-06-09 MED ORDER — KETOROLAC TROMETHAMINE 30 MG/ML IJ SOLN
30.0000 mg | Freq: Once | INTRAMUSCULAR | Status: AC
  Administered 2013-06-09: 30 mg via INTRAVENOUS
  Filled 2013-06-09: qty 1

## 2013-06-09 MED ORDER — IBUPROFEN 600 MG PO TABS: 600.0000 mg | ORAL_TABLET | Freq: Three times a day (TID) | ORAL | Status: DC

## 2013-06-09 MED ORDER — TRAMADOL HCL 50 MG PO TABS: 50.0000 mg | ORAL_TABLET | Freq: Four times a day (QID) | ORAL | Status: DC | PRN

## 2013-06-09 NOTE — ED Provider Notes (Signed)
CSN: [id number]     Arrival date & time 06/09/13  1935 History   First MD Initiated Contact with Patient 06/09/13 2028     Chief Complaint  Patient presents with  . Chest Pain     HPI  Patient presents with new chest pain.  Pain began 3 days ago without clear precipitant.  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 The pain is l-sided, non-radiating, sore, severe, not improved w OTC meds.  The pain is worse w motion, pressure or laughing. No concurrent f/c, n/v/d, cough, edema, dyspnea.   Past Medical History  Diagnosis Date  . Diabetes mellitus type II   . Hyperlipidemia   . Hypertension   . Degenerative disc disease    Past Surgical History  Procedure Laterality Date  . Lumbar laminectomy      [id number]. L4,L5,S1  . Cesarean section      x2  . Tubal ligation    . Lumbar fusion    . Abdominal hysterectomy    . Carpel tunnel     Family History  Problem Relation Age of Onset  . Hypertension Father   . Diabetes Mother   . Stroke Mother   . Breast cancer Maternal Aunt   . Breast cancer Sister    History  Substance Use Topics  . Smoking status: Never Smoker   . Smokeless tobacco: Not on file  . Alcohol Use: No   OB History   Grav Para Term Preterm Abortions TAB SAB Ect Mult Living                 Review of Systems  Constitutional:       Per HPI, otherwise negative  HENT:       Per HPI, otherwise negative  Respiratory:       Per HPI, otherwise negative  Cardiovascular:       Per HPI, otherwise negative  Gastrointestinal: Negative for vomiting.  Endocrine:       Negative aside from HPI  Genitourinary:       Neg aside from HPI   Musculoskeletal:       Per HPI, otherwise negative  Skin: Negative.   Neurological: Negative for syncope.      Allergies  Erythromycin  Home Medications   Current Outpatient Rx  Name  Route  Sig  Dispense  Refill  . albuterol (VENTOLIN HFA) 108 (90 BASE) MCG/ACT inhaler    Inhalation   Inhale 2 puffs into the lungs every 6 (six) hours as needed.           . Ascorbic Acid (VITAMIN C) 500 MG tablet   Oral   Take 500 mg by mouth daily.           Marland Kitchen aspirin 81 MG tablet   Oral   Take 81 mg by mouth daily.           . Cholecalciferol (VITAMIN D-3) 5000 UNITS TABS   Oral   Take by mouth daily.          . fenofibrate micronized (ANTARA) 130 MG capsule   Oral   Take 1 capsule (130 mg total) by mouth daily.   90 capsule   1   . gabapentin (NEURONTIN) 300 MG capsule      TAKE ONE CAPSULE BY MOUTH THREE TIMES DAILY   90 capsule   1   . glipiZIDE (GLUCOTROL) 10 MG tablet      TAKE TWO TABLETS  BY MOUTH TWICE DAILY BEFORE MEAL(S)   240 tablet   0   . hydrochlorothiazide (HYDRODIURIL) 25 MG tablet   Oral   Take 1 tablet (25 mg total) by mouth daily.   90 tablet   2   . lisinopril (PRINIVIL,ZESTRIL) 5 MG tablet   Oral   Take 5 mg by mouth at bedtime.         . potassium chloride SA (K-DUR,KLOR-CON) 20 MEQ tablet   Oral   Take 1 tablet (20 mEq total) by mouth daily.   90 tablet   1   . simvastatin (ZOCOR) 40 MG tablet   Oral   Take 1 tablet (40 mg total) by mouth at bedtime.   90 tablet   3   . sitaGLIPtin-metformin (JANUMET) 50-1000 MG per tablet   Oral   Take 1 tablet by mouth 2 (two) times daily.   180 tablet   0   . vitamin E 400 UNIT capsule   Oral   Take 400 Units by mouth daily.           Marland Kitchen acetaminophen (TYLENOL) 650 MG CR tablet   Oral   Take 1,300 mg by mouth daily.           BP 169/83  Pulse 80  Temp(Src) 98.5 F (36.9 C) (Oral)  Resp 18  SpO2 98% Physical Exam  Nursing note and vitals reviewed. Constitutional: She is oriented to person, place, and time. She appears well-developed and well-nourished. No distress.  HENT:  Head: Normocephalic and atraumatic.  Eyes: Conjunctivae and EOM are normal.  Cardiovascular: Normal rate and regular rhythm.   Pulmonary/Chest: Effort normal and breath sounds  normal. No stridor. No respiratory distress.    Abdominal: She exhibits no distension.  Musculoskeletal: She exhibits no edema.  Neurological: She is alert and oriented to person, place, and time. No cranial nerve deficit.  Skin: Skin is warm and dry.  Psychiatric: She has a normal mood and affect.    ED Course  Procedures (including critical care time) Labs Review Labs Reviewed  BASIC METABOLIC PANEL - Abnormal; Notable for the following:    Glucose, Bld 141 (*)    GFR calc non Af Amer 85 (*)    All other components within normal limits  CBC  TROPONIN I  I-STAT TROPOININ, ED   Imaging Review Dg Chest 2 View  06/09/2013   CLINICAL DATA:  Left breast and axillary pain.  EXAM: CHEST  2 VIEW  COMPARISON:  06/29/2011  FINDINGS: Heart size is normal. There is calcification of the thoracic aorta. I think there may be patchy infiltrate at the left base. No dense consolidation. No effusions. No significant bony findings. Bone island of the distal first rib on the right again noted.  IMPRESSION: Suspicion of patchy infiltrate/pneumonia in the left lower lobe.   Electronically Signed   By: Nelson Chimes M.D.   On: 06/09/2013 20:37     EKG Interpretation   Date/Time:  Monday June 09 2013 19:40:37 EDT Ventricular Rate:  85 PR Interval:  146 QRS Duration: 74 QT Interval:  384 QTC Calculation: 456 R Axis:   66 Text Interpretation:  Normal sinus rhythm Normal ECG Sinus rhythm Artifact  Non-specific intra-ventricular conduction delay Abnormal ekg Confirmed by  Carmin Muskrat  MD ([id number]) on 06/09/2013 8:52:01 PM     O2- 99%ra, nml  11:47 PM Patient states that she feels measurably better, has no new complaints, no evidence of ongoing URI like symptoms.  I discussed the x-ray findings, including the patient's possible pneumonia.  Patient's clinical presentation is not consistent with respiratory process.  MDM   Final diagnoses:  Chest pain    Patient presents with chest pain for  several days.  Patient's evaluation here, including serial troponin is reassuring.  Patient's pain resolved with anti-inflammatories suggesting musculoskeletal etiology.  Patient was discharged in stable condition to follow up with primary care.    Carmin Muskrat, MD 06/09/13 2348

## 2013-06-09 NOTE — ED Notes (Signed)
Pt. reports left chest pain for 3 days worse when bending and moving , denies SOB , nausea or diaphoresis .

## 2013-06-09 NOTE — Discharge Instructions (Signed)
As discussed, your evaluation today has been largely reassuring.  But, it is important that you monitor your condition carefully, and do not hesitate to return to the ED if you develop new, or concerning changes in your condition.  Please take all medication as directed, specifically the ibuprofen, for the next days, 3 times daily. Use the tramadol for breakthrough pain.  Otherwise, please follow-up with your physician for appropriate ongoing care.  Chest Pain (Nonspecific) It is often hard to give a specific diagnosis for the cause of chest pain. There is always a chance that your pain could be related to something serious, such as a heart attack or a blood clot in the lungs. You need to follow up with your caregiver for further evaluation. CAUSES   Heartburn.  Pneumonia or bronchitis.  Anxiety or stress.  Inflammation around your heart (pericarditis) or lung (pleuritis or pleurisy).  A blood clot in the lung.  A collapsed lung (pneumothorax). It can develop suddenly on its own (spontaneous pneumothorax) or from injury (trauma) to the chest.  Shingles infection (herpes zoster virus). The chest wall is composed of bones, muscles, and cartilage. Any of these can be the source of the pain.  The bones can be bruised by injury.  The muscles or cartilage can be strained by coughing or overwork.  The cartilage can be affected by inflammation and become sore (costochondritis). DIAGNOSIS  Lab tests or other studies, such as X-rays, electrocardiography, stress testing, or cardiac imaging, may be needed to find the cause of your pain.  TREATMENT   Treatment depends on what may be causing your chest pain. Treatment may include:  Acid blockers for heartburn.  Anti-inflammatory medicine.  Pain medicine for inflammatory conditions.  Antibiotics if an infection is present.  You may be advised to change lifestyle habits. This includes stopping smoking and avoiding alcohol, caffeine, and  chocolate.  You may be advised to keep your head raised (elevated) when sleeping. This reduces the chance of acid going backward from your stomach into your esophagus.  Most of the time, nonspecific chest pain will improve within 2 to 3 days with rest and mild pain medicine. HOME CARE INSTRUCTIONS   If antibiotics were prescribed, take your antibiotics as directed. Finish them even if you start to feel better.  For the next few days, avoid physical activities that bring on chest pain. Continue physical activities as directed.  Do not smoke.  Avoid drinking alcohol.  Only take over-the-counter or prescription medicine for pain, discomfort, or fever as directed by your caregiver.  Follow your caregiver's suggestions for further testing if your chest pain does not go away.  Keep any follow-up appointments you made. If you do not go to an appointment, you could develop lasting (chronic) problems with pain. If there is any problem keeping an appointment, you must call to reschedule. SEEK MEDICAL CARE IF:   You think you are having problems from the medicine you are taking. Read your medicine instructions carefully.  Your chest pain does not go away, even after treatment.  You develop a rash with blisters on your chest. SEEK IMMEDIATE MEDICAL CARE IF:   You have increased chest pain or pain that spreads to your arm, neck, jaw, back, or abdomen.  You develop shortness of breath, an increasing cough, or you are coughing up blood.  You have severe back or abdominal pain, feel nauseous, or vomit.  You develop severe weakness, fainting, or chills.  You have a fever. THIS IS AN  EMERGENCY. Do not wait to see if the pain will go away. Get medical help at once. Call your local emergency services (911 in [place] Do not drive yourself to the hospital. MAKE SURE YOU:   Understand these instructions.  Will watch your condition.  Will get help right away if you are not doing well or get  worse. Document Released: 12/07/2004 Document Revised: 05/22/2011 Document Reviewed: 10/03/2007 [facility] Patient Information 2014 [place].

## 2013-06-10 ENCOUNTER — Encounter: Payer: Self-pay | Admitting: Family Medicine

## 2013-06-10 ENCOUNTER — Other Ambulatory Visit: Payer: Self-pay | Admitting: Family Medicine

## 2013-06-10 LAB — TROPONIN I

## 2013-06-10 NOTE — Telephone Encounter (Addendum)
Spoke with patient regarding her ED visit.  Patient stated that after the work up, they determined that the patient's symptoms were not cardiac related, but instead may be due to a pulled muscle.  She was given tramadol and ibuprofen.  She has been taking prescriptions as prescribed.  Patient states that she still has some pain, but that the medications have been effective in relieving her pain.  Currently rates her pain 2/10.  Patient thinks that the pull muscle is from "pulling on" her husband's sister who has MR.  Patient was encouraged to look into obtaining a patient lift or some sort of equipment to help with maneuvering the husband's sister in bed.  It was determined that patient does not need a follow-up appointment at this time, but was encouraged to either call us or immediately report to the ED if her symptoms worsen.  She stated understanding and agreed with plan.

## 2013-06-10 NOTE — Telephone Encounter (Signed)
Med filled.  

## 2013-06-20 ENCOUNTER — Telehealth: Payer: Self-pay

## 2013-06-20 NOTE — Telephone Encounter (Signed)
Medication and allergies:  Reviewed and updated  90 day supply/mail order: n/a Local pharmacy:  [id number] - [place] ([place]), [place]   Immunizations due:  UTD  DUE FOR: Eye exam (patient plans to schedule), Foot exam and colonoscopy   A/P: No changes to personal, family history or past surgical hx PAP- No longer receives Paps; hx. hysterectomy CCS- 06/27/02-abnormal; patient encouraged to schedule a repeat colonoscopy; stated that she plans to schedule one this year. MMG- 10/02/12- fibroadenomatoid calcification BD- 07/28/11-normal Flu- 12/18/12 Tdap- 02/23/04 PNA- 06/19/11 Shingles- 03/28/10  To Discuss with Provider: Nothing at this time.

## 2013-06-21 DIAGNOSIS — E119 Type 2 diabetes mellitus without complications: Secondary | ICD-10-CM | POA: Diagnosis not present

## 2013-06-21 LAB — HM DIABETES EYE EXAM

## 2013-06-23 ENCOUNTER — Encounter: Payer: Self-pay | Admitting: Family Medicine

## 2013-06-23 ENCOUNTER — Ambulatory Visit (INDEPENDENT_AMBULATORY_CARE_PROVIDER_SITE_OTHER): Payer: Medicare Other | Admitting: Family Medicine

## 2013-06-23 VITALS — BP 120/76 | HR 73 | Temp 98.4°F | Resp 16 | Ht 69.5 in | Wt 211.2 lb

## 2013-06-23 DIAGNOSIS — Z0000 Encounter for general adult medical examination without abnormal findings: Secondary | ICD-10-CM

## 2013-06-23 DIAGNOSIS — I10 Essential (primary) hypertension: Secondary | ICD-10-CM

## 2013-06-23 DIAGNOSIS — E1149 Type 2 diabetes mellitus with other diabetic neurological complication: Secondary | ICD-10-CM

## 2013-06-23 DIAGNOSIS — E1140 Type 2 diabetes mellitus with diabetic neuropathy, unspecified: Secondary | ICD-10-CM

## 2013-06-23 DIAGNOSIS — E785 Hyperlipidemia, unspecified: Secondary | ICD-10-CM | POA: Diagnosis not present

## 2013-06-23 DIAGNOSIS — E8941 Symptomatic postprocedural ovarian failure: Secondary | ICD-10-CM

## 2013-06-23 LAB — BASIC METABOLIC PANEL
BUN: 18 mg/dL (ref 6–23)
CALCIUM: 10 mg/dL (ref 8.4–10.5)
CHLORIDE: 102 meq/L (ref 96–112)
CO2: 22 meq/L (ref 19–32)
CREATININE: 0.6 mg/dL (ref 0.4–1.2)
GFR: 101.77 mL/min (ref >60.00)
Glucose, Bld: 91 mg/dL (ref 70–99)
Potassium: 3.9 mEq/L (ref 3.5–5.1)
Sodium: 139 mEq/L (ref 135–145)

## 2013-06-23 LAB — HEPATIC FUNCTION PANEL
ALT: 22 U/L (ref 0–35)
AST: 29 U/L (ref 0–37)
Albumin: 4.4 g/dL (ref 3.5–5.2)
Alkaline Phosphatase: 37 U/L — ABNORMAL LOW (ref 39–117)
BILIRUBIN TOTAL: 0.4 mg/dL (ref 0.3–1.2)
Bilirubin, Direct: 0 mg/dL (ref 0.0–0.3)
Total Protein: 7.3 g/dL (ref 6.0–8.3)

## 2013-06-23 LAB — CBC WITH DIFFERENTIAL/PLATELET
Basophils Absolute: 0.1 10*3/uL (ref 0.0–0.1)
Basophils Relative: 0.8 % (ref 0.0–3.0)
EOS ABS: 0.5 10*3/uL (ref 0.0–0.7)
EOS PCT: 5.9 % — AB (ref 0.0–5.0)
HEMATOCRIT: 38.5 % (ref 36.0–46.0)
Hemoglobin: 12.6 g/dL (ref 12.0–15.0)
LYMPHS ABS: 3.4 10*3/uL (ref 0.7–4.0)
Lymphocytes Relative: 39.3 % (ref 12.0–46.0)
MCHC: 32.8 g/dL (ref 30.0–36.0)
MCV: 91.2 fl (ref 78.0–100.0)
MONO ABS: 0.8 10*3/uL (ref 0.1–1.0)
Monocytes Relative: 8.8 % (ref 3.0–12.0)
Neutro Abs: 3.9 10*3/uL (ref 1.4–7.7)
Neutrophils Relative %: 45.2 % (ref 43.0–77.0)
Platelets: 299 10*3/uL (ref 150.0–400.0)
RBC: 4.22 Mil/uL (ref 3.87–5.11)
RDW: 14.7 % — ABNORMAL HIGH (ref 11.5–14.6)
WBC: 8.7 10*3/uL (ref 4.5–10.5)

## 2013-06-23 LAB — LIPID PANEL
CHOL/HDL RATIO: 3
Cholesterol: 164 mg/dL (ref 0–200)
HDL: 47.8 mg/dL (ref >39.00)
LDL Cholesterol: 80 mg/dL (ref 0–99)
TRIGLYCERIDES: 180 mg/dL — AB (ref 0.0–149.0)
VLDL: 36 mg/dL (ref 0.0–40.0)

## 2013-06-23 LAB — TSH: TSH: 0.33 u[IU]/mL — AB (ref 0.35–5.50)

## 2013-06-23 LAB — HEMOGLOBIN A1C: Hgb A1c MFr Bld: 7.5 % — ABNORMAL HIGH (ref 4.6–6.5)

## 2013-06-23 NOTE — Assessment & Plan Note (Signed)
Pt's PE WNL.  UTD on health maintenance w/ exception of colonoscopy- pt encouraged to schedule.  Check labs.  Anticipatory guidance provided.

## 2013-06-23 NOTE — Assessment & Plan Note (Signed)
Chronic problem, excellent control today.  Asymptomatic.  Check labs.  No anticipated med changes.

## 2013-06-23 NOTE — Patient Instructions (Signed)
Follow up in 3-4 months to recheck sugars We'll notify you of your lab results and make any changes if needed Keep up the good work!  You look great! Call and schedule your mammo for later this summer- they'll do the bone density at the same time Call with any questions or concerns Happy Spring!!!

## 2013-06-23 NOTE — Assessment & Plan Note (Signed)
Chronic problem.  Pt denies symptomatic lows.  UTD on eye exam.  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  Check labs.  Adjust meds prn

## 2013-06-23 NOTE — Progress Notes (Signed)
Pre visit review using our clinic review tool, if applicable. No additional management support is needed unless otherwise documented below in the visit note. 

## 2013-06-23 NOTE — Progress Notes (Signed)
   Subjective:    Patient ID: Sandra Ray, female    DOB: Nov 14, 1945, 68 y.o.   MRN: [id number]  HPI Here today for CPE.  Risk Factors: HTN- chronic problem, excellent control on Lisinopril, HCTZ.  No CP, SOB, HAs, visual changes, edema. DM- chronic problem, on Janumet, Glipizide.  UTD on eye exam (Saturday)- no retinopathy.  No symptomatic lows.  No N/V/D.  No progressive numbness, no weakness Hyperlipidemia- chronic problem, on Simvastatin, fenofibrate.  No abd pain, N/V, myalgias Physical Activity: active Fall Risk: low Depression: denies current sxs Hearing: normal to conversational tones and whispered voice at 6 ft ADL's: independent Cognitive: normal linear thought process, memory and attention intact Home Safety: safe at home, lives w/ husband. Height, Weight, BMI, Visual Acuity: see vitals, vision corrected to 20/20 w/ glasses Counseling: UTD on mammo, due for DEXA in May.  Due to schedule colonoscopy- pt plans to call. Labs Ordered: See A&P Care Plan: See A&P    Review of Systems Patient reports no vision/ hearing changes, adenopathy,fever, weight change,  persistant/recurrent hoarseness , swallowing issues, chest pain, palpitations, edema, persistant/recurrent cough, hemoptysis, dyspnea (rest/exertional/paroxysmal nocturnal), gastrointestinal bleeding (melena, rectal bleeding), abdominal pain, significant heartburn, bowel changes, GU symptoms (dysuria, hematuria, incontinence), Gyn symptoms (abnormal  bleeding, pain),  syncope, focal weakness, memory loss, numbness & tingling, skin/hair/nail changes, abnormal bruising or bleeding, anxiety, or depression.     Objective:   Physical Exam General Appearance:    Alert, cooperative, no distress, appears stated age  Head:    Normocephalic, without obvious abnormality, atraumatic  Eyes:    PERRL, conjunctiva/corneas clear, EOM's intact, fundi    benign, both eyes  Ears:    Normal TM's and external ear canals, both ears  Nose:    Nares normal, septum midline, mucosa normal, no drainage    or sinus tenderness  Throat:   Lips, mucosa, and tongue normal; teeth and gums normal  Neck:   Supple, symmetrical, trachea midline, no adenopathy;    Thyroid: no enlargement/tenderness/nodules  Back:     Symmetric, no curvature, ROM normal, no CVA tenderness  Lungs:     Clear to auscultation bilaterally, respirations unlabored  Chest Wall:    No tenderness or deformity   Heart:    Regular rate and rhythm, S1 and S2 normal, no murmur, rub   or gallop  Breast Exam:    Deferred to mammo  Abdomen:     Soft, non-tender, bowel sounds active all four quadrants,    no masses, no organomegaly  Genitalia:    Deferred  Rectal:    Extremities:   Extremities normal, atraumatic, no cyanosis or edema  Pulses:   2+ and symmetric all extremities  Skin:   Skin color, texture, turgor normal, no rashes or lesions  Lymph nodes:   Cervical, supraclavicular, and axillary nodes normal  Neurologic:   CNII-XII intact, normal strength, sensation and reflexes    throughout          Assessment & Plan:

## 2013-06-23 NOTE — Assessment & Plan Note (Signed)
Get DEXA 

## 2013-06-23 NOTE — Assessment & Plan Note (Signed)
Chronic problem.  Tolerating statin w/o difficulty.  Check labs.  Adjust meds prn  

## 2013-06-24 ENCOUNTER — Ambulatory Visit: Payer: Medicare Other

## 2013-06-24 DIAGNOSIS — R946 Abnormal results of thyroid function studies: Secondary | ICD-10-CM

## 2013-06-24 LAB — T4, FREE: Free T4: 0.78 ng/dL (ref 0.60–1.60)

## 2013-06-24 LAB — T3, FREE: T3, Free: 3 pg/mL (ref 2.3–4.2)

## 2013-07-22 ENCOUNTER — Other Ambulatory Visit: Payer: Self-pay | Admitting: Family Medicine

## 2013-07-22 NOTE — Telephone Encounter (Signed)
Med filled.  

## 2013-08-11 ENCOUNTER — Telehealth: Payer: Self-pay | Admitting: Family Medicine

## 2013-08-11 MED ORDER — GLIPIZIDE 10 MG PO TABS: 20.0000 mg | ORAL_TABLET | Freq: Two times a day (BID) | ORAL | Status: DC

## 2013-08-11 NOTE — Telephone Encounter (Signed)
Med filled will notify pt.

## 2013-08-11 NOTE — Telephone Encounter (Signed)
Caller name:Takeesha Bochenek Relation to GB:[id number] Call back number: [phone] Pharmacy: [facility]  Reason for call: Patient called to request a refill for glipiZIDE (GLUCOTROL) 10 MG tablet. Also patient stated that she is completely out of her meds.

## 2013-08-18 ENCOUNTER — Other Ambulatory Visit: Payer: Self-pay | Admitting: Family Medicine

## 2013-08-18 NOTE — Telephone Encounter (Signed)
Med filled.  

## 2013-08-20 ENCOUNTER — Other Ambulatory Visit: Payer: Self-pay | Admitting: Family Medicine

## 2013-08-21 NOTE — Telephone Encounter (Signed)
Med filled.  

## 2013-09-08 ENCOUNTER — Other Ambulatory Visit: Payer: Self-pay | Admitting: Family Medicine

## 2013-09-08 NOTE — Telephone Encounter (Signed)
Med filled.  

## 2013-09-22 ENCOUNTER — Other Ambulatory Visit: Payer: Self-pay | Admitting: Family Medicine

## 2013-09-22 ENCOUNTER — Other Ambulatory Visit: Payer: Self-pay

## 2013-09-22 ENCOUNTER — Encounter: Payer: Self-pay | Admitting: Family Medicine

## 2013-09-22 ENCOUNTER — Ambulatory Visit (INDEPENDENT_AMBULATORY_CARE_PROVIDER_SITE_OTHER)
Admission: RE | Admit: 2013-09-22 | Discharge: 2013-09-22 | Disposition: A | Discharge status: Home / Self Care | Payer: Medicare Other | Source: Ambulatory Visit | Attending: Family Medicine | Admitting: Family Medicine

## 2013-09-22 ENCOUNTER — Ambulatory Visit (INDEPENDENT_AMBULATORY_CARE_PROVIDER_SITE_OTHER): Payer: Medicare Other | Admitting: Family Medicine

## 2013-09-22 VITALS — BP 118/78 | HR 73 | Temp 98.0°F | Resp 16 | Wt 211.1 lb

## 2013-09-22 DIAGNOSIS — E876 Hypokalemia: Secondary | ICD-10-CM

## 2013-09-22 DIAGNOSIS — E1142 Type 2 diabetes mellitus with diabetic polyneuropathy: Secondary | ICD-10-CM | POA: Diagnosis not present

## 2013-09-22 DIAGNOSIS — E1149 Type 2 diabetes mellitus with other diabetic neurological complication: Secondary | ICD-10-CM

## 2013-09-22 DIAGNOSIS — M79609 Pain in unspecified limb: Secondary | ICD-10-CM | POA: Diagnosis not present

## 2013-09-22 DIAGNOSIS — M79644 Pain in right finger(s): Secondary | ICD-10-CM | POA: Insufficient documentation

## 2013-09-22 DIAGNOSIS — Z1231 Encounter for screening mammogram for malignant neoplasm of breast: Secondary | ICD-10-CM

## 2013-09-22 DIAGNOSIS — E1140 Type 2 diabetes mellitus with diabetic neuropathy, unspecified: Secondary | ICD-10-CM

## 2013-09-22 DIAGNOSIS — S6990XA Unspecified injury of unspecified wrist, hand and finger(s), initial encounter: Secondary | ICD-10-CM | POA: Diagnosis not present

## 2013-09-22 LAB — BASIC METABOLIC PANEL
BUN: 21 mg/dL (ref 6–23)
CO2: 26 mEq/L (ref 19–32)
Calcium: 9.5 mg/dL (ref 8.4–10.5)
Chloride: 104 mEq/L (ref 96–112)
Creatinine, Ser: 0.6 mg/dL (ref 0.4–1.2)
GFR: 98.04 mL/min (ref >60.00)
Glucose, Bld: 101 mg/dL — ABNORMAL HIGH (ref 70–99)
POTASSIUM: 3.8 meq/L (ref 3.5–5.1)
Sodium: 139 mEq/L (ref 135–145)

## 2013-09-22 LAB — HEMOGLOBIN A1C: Hgb A1c MFr Bld: 7.8 % — ABNORMAL HIGH (ref 4.6–6.5)

## 2013-09-22 MED ORDER — LISINOPRIL 5 MG PO TABS: ORAL_TABLET | ORAL | Status: DC

## 2013-09-22 MED ORDER — PROMETHAZINE HCL 25 MG PO TABS: 25.0000 mg | ORAL_TABLET | Freq: Four times a day (QID) | ORAL | Status: DC | PRN

## 2013-09-22 MED ORDER — POTASSIUM CHLORIDE CRYS ER 20 MEQ PO TBCR: 20.0000 meq | EXTENDED_RELEASE_TABLET | Freq: Every day | ORAL | Status: DC

## 2013-09-22 NOTE — Assessment & Plan Note (Signed)
New.  Due to duration of pain and swelling, will get Xray to r/o fx.  Will determine next steps based on xray.

## 2013-09-22 NOTE — Progress Notes (Signed)
   Subjective:    Patient ID: Sandra Ray, female    DOB: 11/12/1945, 68 y.o.   MRN: [id number]  HPI DM- chronic problem, on Janumet, Glipizide.  ACE for renal protection.  UTD on eye exam.  Following w/ podiatry for foot exam.  Denies symptomatic lows.  No CP, SOB, HAs, visual changes, edema, + nausea (ongoing problem) but no V/D.  R 5th finger pain- pt reports she 'jammed' her finger 8 weeks ago.  Pulled her finger and 'felt and heard a pop'.  Finger remains swollen and painful to bend at PIP joint.   Review of Systems For ROS see HPI     Objective:   Physical Exam  Vitals reviewed. Constitutional: She is oriented to person, place, and time. She appears well-developed and well-nourished. No distress.  HENT:  Head: Normocephalic and atraumatic.  Eyes: Conjunctivae and EOM are normal. Pupils are equal, round, and reactive to light.  Neck: Normal range of motion. Neck supple. No thyromegaly present.  Cardiovascular: Normal rate, regular rhythm, normal heart sounds and intact distal pulses.   No murmur heard. Pulmonary/Chest: Effort normal and breath sounds normal. No respiratory distress.  Abdominal: Soft. She exhibits no distension. There is no tenderness.  Musculoskeletal: She exhibits edema (of R 5th finger over PIP joint w/ painful flexion).  Lymphadenopathy:    She has no cervical adenopathy.  Neurological: She is alert and oriented to person, place, and time.  Skin: Skin is warm and dry.  Psychiatric: She has a normal mood and affect. Her behavior is normal.          Assessment & Plan:

## 2013-09-22 NOTE — Assessment & Plan Note (Signed)
Chronic problem.  Pt denies low CBGs.  UTD on eye exam.  Tolerating meds w/o difficulty w/ exception of intermittent nausea w/ metformin.  Check labs.  Adjust meds prn

## 2013-09-22 NOTE — Patient Instructions (Addendum)
Follow up in 3 months to recheck diabetes and cholesterol Go to the [place] office ([place]) to get your xray We'll notify you of your lab results and make any changes if needed Call with any questions or concerns Enjoy the rest of your summer!!

## 2013-09-22 NOTE — Progress Notes (Signed)
Pre visit review using our clinic review tool, if applicable. No additional management support is needed unless otherwise documented below in the visit note. 

## 2013-09-30 ENCOUNTER — Ambulatory Visit (INDEPENDENT_AMBULATORY_CARE_PROVIDER_SITE_OTHER): Payer: Medicare Other | Admitting: Internal Medicine

## 2013-09-30 ENCOUNTER — Telehealth: Payer: Self-pay | Admitting: Family Medicine

## 2013-09-30 VITALS — BP 100/60 | HR 86 | Temp 98.1°F | Resp 16 | Ht 70.0 in | Wt 210.8 lb

## 2013-09-30 DIAGNOSIS — H109 Unspecified conjunctivitis: Secondary | ICD-10-CM

## 2013-09-30 DIAGNOSIS — J209 Acute bronchitis, unspecified: Secondary | ICD-10-CM | POA: Diagnosis not present

## 2013-09-30 MED ORDER — AZITHROMYCIN 500 MG PO TABS: 500.0000 mg | ORAL_TABLET | Freq: Every day | ORAL | Status: DC

## 2013-09-30 MED ORDER — HYDROCOD POLST-CHLORPHEN POLST 10-8 MG/5ML PO LQCR: 5.0000 mL | Freq: Two times a day (BID) | ORAL | Status: DC | PRN

## 2013-09-30 NOTE — Progress Notes (Signed)
   Subjective:    Patient ID: Sandra Ray, female    DOB: 11-23-1945, 68 y.o.   MRN: [id number]  HPI 68 year old female here for cough, sinus pressure, and conjunctivitis in right eye. On Friday her right eye was watery and mild green discharge. Saturday afternoon she started to feel sinus congestion and pressure. The cough started Sunday. It kept her up half the night last night. She produces green phlegm when she coughs. No SOB. No chest pain. Mild wheezing. She used to get sinus infections and bronchitis frequently over the last 10-15 years. States she has had some nausea. No emesis.   Uses zpak with no problems  Review of Systems     Objective:   Physical Exam  Vitals reviewed. Constitutional: She is oriented to person, place, and time. She appears well-developed and well-nourished. No distress.  HENT:  Head: Normocephalic.  Right Ear: External ear normal.  Left Ear: External ear normal.  Nose: Mucosal edema and rhinorrhea present. Right sinus exhibits no maxillary sinus tenderness and no frontal sinus tenderness. Left sinus exhibits no maxillary sinus tenderness and no frontal sinus tenderness.  Mouth/Throat: Oropharynx is clear and moist.  Eyes: EOM are normal. Pupils are equal, round, and reactive to light. Left eye exhibits discharge.  Neck: Normal range of motion. Neck supple.  Cardiovascular: Normal rate, regular rhythm and normal heart sounds.   Pulmonary/Chest: Effort normal and breath sounds normal. No respiratory distress. She has no wheezes. She has no rales. She exhibits no tenderness.  Musculoskeletal: Normal range of motion.  Neurological: She is alert and oriented to person, place, and time. No cranial nerve deficit. She exhibits normal muscle tone. Coordination normal.  Psychiatric: She has a normal mood and affect. Her behavior is normal.          Assessment & Plan:  Bronchitis/Conjunctivitis Zithromax 500mg Sandra Ray

## 2013-09-30 NOTE — Telephone Encounter (Signed)
Pt advised to go to [place] since they are a [facility].

## 2013-09-30 NOTE — Patient Instructions (Addendum)
Cough, Adult  A cough is a reflex that helps clear your throat and airways. It can help heal the body or may be a reaction to an irritated airway. A cough may only last 2 or 3 weeks (acute) or may last more than 8 weeks (chronic).  CAUSES Acute cough:  Viral or bacterial infections. Chronic cough:  Infections.  Allergies.  Asthma.  Post-nasal drip.  Smoking.  Heartburn or acid reflux.  Some medicines.  Chronic lung problems (COPD).  Cancer. SYMPTOMS   Cough.  Fever.  Chest pain.  Increased breathing rate.  High-pitched whistling sound when breathing (wheezing).  Colored mucus that you cough up (sputum). TREATMENT   A bacterial cough may be treated with antibiotic medicine.  A viral cough must run its course and will not respond to antibiotics.  Your caregiver may recommend other treatments if you have a chronic cough. HOME CARE INSTRUCTIONS   Only take over-the-counter or prescription medicines for pain, discomfort, or fever as directed by your caregiver. Use cough suppressants only as directed by your caregiver.  Use a cold steam vaporizer or humidifier in your bedroom or home to help loosen secretions.  Sleep in a semi-upright position if your cough is worse at night.  Rest as needed.  Stop smoking if you smoke. SEEK IMMEDIATE MEDICAL CARE IF:   You have pus in your sputum.  Your cough starts to worsen.  You cannot control your cough with suppressants and are losing sleep.  You begin coughing up blood.  You have difficulty breathing.  You develop pain which is getting worse or is uncontrolled with medicine.  You have a fever. MAKE SURE YOU:   Understand these instructions.  Will watch your condition.  Will get help right away if you are not doing well or get worse. Document Released: 08/26/2010 Document Revised: 05/22/2011 Document Reviewed: 08/26/2010 [facility] Patient Information 2015 [place], [place]. This information is not intended  to replace advice given to you by your health care provider. Make sure you discuss any questions you have with your health care provider.  Bronchitis Bronchitis is inflammation of the airways that extend from the windpipe into the lungs (bronchi). The inflammation often causes mucus to develop, which leads to a cough. If the inflammation becomes severe, it may cause shortness of breath. CAUSES  Bronchitis may be caused by:   Viral infections.   Bacteria.   Cigarette smoke.   Allergens, pollutants, and other irritants.  SIGNS AND SYMPTOMS  The most common symptom of bronchitis is a frequent cough that produces mucus. Other symptoms include:  Fever.   Body aches.   Chest congestion.   Chills.   Shortness of breath.   Sore throat.  DIAGNOSIS  Bronchitis is usually diagnosed through a medical history and physical exam. Tests, such as chest X-rays, are sometimes done to rule out other conditions.  TREATMENT  You may need to avoid contact with whatever caused the problem (smoking, for example). Medicines are sometimes needed. These may include:  Antibiotics. These may be prescribed if the condition is caused by bacteria.  Cough suppressants. These may be prescribed for relief of cough symptoms.   Inhaled medicines. These may be prescribed to help open your airways and make it easier for you to breathe.   Steroid medicines. These may be prescribed for those with recurrent (chronic) bronchitis. HOME CARE INSTRUCTIONS  Get plenty of rest.   Drink enough fluids to keep your urine clear or pale yellow (unless you have a medical  condition that requires fluid restriction). Increasing fluids may help thin your secretions and will prevent dehydration.   Only take over-the-counter or prescription medicines as directed by your health care provider.  Only take antibiotics as directed. Make sure you finish them even if you start to feel better.  Avoid secondhand smoke,  irritating chemicals, and strong fumes. These will make bronchitis worse. If you are a smoker, quit smoking. Consider using nicotine gum or skin patches to help control withdrawal symptoms. Quitting smoking will help your lungs heal faster.   Put a cool-mist humidifier in your bedroom at night to moisten the air. This may help loosen mucus. Change the water in the humidifier daily. You can also run the hot water in your shower and sit in the bathroom with the door closed for 5-10 minutes.   Follow up with your health care provider as directed.   Wash your hands frequently to avoid catching bronchitis again or spreading an infection to others.  SEEK MEDICAL CARE IF: Your symptoms do not improve after 1 week of treatment.  SEEK IMMEDIATE MEDICAL CARE IF:  Your fever increases.  You have chills.   You have chest pain.   You have worsening shortness of breath.   You have bloody sputum.  You faint.  You have lightheadedness.  You have a severe headache.   You vomit repeatedly. MAKE SURE YOU:   Understand these instructions.  Will watch your condition.  Will get help right away if you are not doing well or get worse. Document Released: 02/27/2005 Document Revised: 12/18/2012 Document Reviewed: 10/22/2012 [facility] Patient Information 2015 [place], [place]. This information is not intended to replace advice given to you by your health care provider. Make sure you discuss any questions you have with your health care provider.  Conjunctivitis Conjunctivitis is commonly called "pink eye." Conjunctivitis can be caused by bacterial or viral infection, allergies, or injuries. There is usually redness of the lining of the eye, itching, discomfort, and sometimes discharge. There may be deposits of matter along the eyelids. A viral infection usually causes a watery discharge, while a bacterial infection causes a yellowish, thick discharge. Pink eye is very contagious and spreads by  direct contact. You may be given antibiotic eyedrops as part of your treatment. Before using your eye medicine, remove all drainage from the eye by washing gently with warm water and cotton balls. Continue to use the medication until you have awakened 2 mornings in a row without discharge from the eye. Do not rub your eye. This increases the irritation and helps spread infection. Use separate towels from other household members. Wash your hands with soap and water before and after touching your eyes. Use cold compresses to reduce pain and sunglasses to relieve irritation from light. Do not wear contact lenses or wear eye makeup until the infection is gone. SEEK MEDICAL CARE IF:   Your symptoms are not better after 3 days of treatment.  You have increased pain or trouble seeing.  The outer eyelids become very red or swollen. Document Released: 04/06/2004 Document Revised: 05/22/2011 Document Reviewed: 02/27/2005 [facility] Patient Information 2015 [place], [place]. This information is not intended to replace advice given to you by your health care provider. Make sure you discuss any questions you have with your health care provider.

## 2013-09-30 NOTE — Telephone Encounter (Signed)
Caller name: Hodan  Call back number:[phone]   Reason for call:  Pt called in and stated that she has conjunctivas in her eyes, and she is coughing up infection.  Pt states she has pressure in eyes.  Pt does want to go to UC or anything until Dr. Birdie Riddle advises her on what to do.

## 2013-10-07 ENCOUNTER — Other Ambulatory Visit: Payer: Medicare Other

## 2013-10-07 ENCOUNTER — Ambulatory Visit: Payer: Medicare Other

## 2013-10-16 ENCOUNTER — Ambulatory Visit
Admission: RE | Admit: 2013-10-16 | Discharge: 2013-10-16 | Disposition: A | Discharge status: Home / Self Care | Payer: Medicare Other | Source: Ambulatory Visit

## 2013-10-16 ENCOUNTER — Ambulatory Visit
Admission: RE | Admit: 2013-10-16 | Discharge: 2013-10-16 | Disposition: A | Discharge status: Home / Self Care | Payer: Medicare Other | Source: Ambulatory Visit | Attending: Family Medicine | Admitting: Family Medicine

## 2013-10-16 DIAGNOSIS — Z1231 Encounter for screening mammogram for malignant neoplasm of breast: Secondary | ICD-10-CM | POA: Diagnosis not present

## 2013-10-16 DIAGNOSIS — Z780 Asymptomatic menopausal state: Secondary | ICD-10-CM | POA: Diagnosis not present

## 2013-10-16 DIAGNOSIS — E8941 Symptomatic postprocedural ovarian failure: Secondary | ICD-10-CM

## 2013-10-16 LAB — HM DEXA SCAN: HM DEXA SCAN: NORMAL

## 2013-10-30 ENCOUNTER — Encounter: Payer: Self-pay | Admitting: General Practice

## 2013-12-01 ENCOUNTER — Telehealth: Payer: Self-pay | Admitting: Family Medicine

## 2013-12-01 MED ORDER — GLIPIZIDE 10 MG PO TABS: 20.0000 mg | ORAL_TABLET | Freq: Two times a day (BID) | ORAL | Status: DC

## 2013-12-01 NOTE — Telephone Encounter (Signed)
Med filled.  

## 2013-12-01 NOTE — Telephone Encounter (Signed)
Caller name: Satine Relation to UK:RCVK Call back number: [phone] Pharmacy: [facility] [address]  Reason for call:   Patient requesting refill of glipizide

## 2013-12-02 ENCOUNTER — Other Ambulatory Visit: Payer: Self-pay | Admitting: General Practice

## 2013-12-02 MED ORDER — FENOFIBRATE MICRONIZED 130 MG PO CAPS: ORAL_CAPSULE | ORAL | Status: DC

## 2013-12-02 MED ORDER — SITAGLIPTIN PHOS-METFORMIN HCL 50-1000 MG PO TABS: ORAL_TABLET | ORAL | Status: DC

## 2013-12-08 ENCOUNTER — Encounter: Payer: Self-pay | Admitting: Podiatry

## 2013-12-08 ENCOUNTER — Other Ambulatory Visit: Payer: Self-pay | Admitting: General Practice

## 2013-12-08 ENCOUNTER — Ambulatory Visit (INDEPENDENT_AMBULATORY_CARE_PROVIDER_SITE_OTHER): Payer: Medicare Other | Admitting: Podiatry

## 2013-12-08 VITALS — BP 169/72 | HR 82 | Ht 70.0 in | Wt 206.0 lb

## 2013-12-08 DIAGNOSIS — M7750 Other enthesopathy of unspecified foot: Secondary | ICD-10-CM | POA: Diagnosis not present

## 2013-12-08 DIAGNOSIS — M79609 Pain in unspecified limb: Secondary | ICD-10-CM | POA: Diagnosis not present

## 2013-12-08 DIAGNOSIS — L600 Ingrowing nail: Secondary | ICD-10-CM

## 2013-12-08 DIAGNOSIS — M216X9 Other acquired deformities of unspecified foot: Secondary | ICD-10-CM | POA: Diagnosis not present

## 2013-12-08 DIAGNOSIS — M79606 Pain in leg, unspecified: Secondary | ICD-10-CM

## 2013-12-08 DIAGNOSIS — M79604 Pain in right leg: Secondary | ICD-10-CM

## 2013-12-08 DIAGNOSIS — M7741 Metatarsalgia, right foot: Secondary | ICD-10-CM

## 2013-12-08 HISTORY — DX: Metatarsalgia, right foot: M77.41

## 2013-12-08 HISTORY — DX: Ingrowing nail: L60.0

## 2013-12-08 HISTORY — DX: Pain in leg, unspecified: M79.606

## 2013-12-08 MED ORDER — SITAGLIPTIN PHOS-METFORMIN HCL 50-1000 MG PO TABS: ORAL_TABLET | ORAL | Status: DC

## 2013-12-08 NOTE — Progress Notes (Signed)
68 year old female presents complaining of painful nails and swelling on right foot and blister on great toe after wearing a new pair of shoes. Also complaining of toes are curling on right foot.  Objective: Normal neurovascular status. Contracted toe 2nd right. Mild forefoot edema lateral column right. Both feet Tight Achilles tendon noted.  Both feet has excess forefoot varus with excess STJ pronation. Ingrown nail on right great toe.  Assessment: Painful ingrown nail right great toe without infection or drainage. Ankle Equinus bilateral. Compensated forefoot varus with lateral weight shifting right. Forefoot varus with edema right. Compensatory STJ hyperpronation bilateral.   Plan: Reviewed clinical findings and available options. Ingrown nail debrided. Instructed Achilles tendon stretch exercise. Will monitor monthly.

## 2013-12-08 NOTE — Patient Instructions (Signed)
Pain in right foot with swelling.  Noted of tight Achilles tendon on both feet.  Need to do daily stretch exercise. Return in one month.

## 2013-12-23 ENCOUNTER — Encounter: Payer: Self-pay | Admitting: Family Medicine

## 2013-12-23 ENCOUNTER — Ambulatory Visit (INDEPENDENT_AMBULATORY_CARE_PROVIDER_SITE_OTHER): Payer: Medicare Other | Admitting: Family Medicine

## 2013-12-23 VITALS — BP 118/80 | HR 72 | Temp 97.8°F | Resp 16 | Wt 208.1 lb

## 2013-12-23 DIAGNOSIS — I10 Essential (primary) hypertension: Secondary | ICD-10-CM

## 2013-12-23 DIAGNOSIS — E1140 Type 2 diabetes mellitus with diabetic neuropathy, unspecified: Secondary | ICD-10-CM

## 2013-12-23 DIAGNOSIS — Z23 Encounter for immunization: Secondary | ICD-10-CM

## 2013-12-23 DIAGNOSIS — E785 Hyperlipidemia, unspecified: Secondary | ICD-10-CM

## 2013-12-23 DIAGNOSIS — E1169 Type 2 diabetes mellitus with other specified complication: Secondary | ICD-10-CM | POA: Diagnosis not present

## 2013-12-23 LAB — HEPATIC FUNCTION PANEL
ALK PHOS: 36 U/L — AB (ref 39–117)
ALT: 26 U/L (ref 0–35)
AST: 35 U/L (ref 0–37)
Albumin: 3.9 g/dL (ref 3.5–5.2)
Bilirubin, Direct: 0 mg/dL (ref 0.0–0.3)
Total Bilirubin: 0.6 mg/dL (ref 0.2–1.2)
Total Protein: 7.6 g/dL (ref 6.0–8.3)

## 2013-12-23 LAB — BASIC METABOLIC PANEL
BUN: 18 mg/dL (ref 6–23)
CO2: 24 meq/L (ref 19–32)
Calcium: 9.7 mg/dL (ref 8.4–10.5)
Chloride: 101 mEq/L (ref 96–112)
Creatinine, Ser: 0.7 mg/dL (ref 0.4–1.2)
GFR: 86.9 mL/min (ref >60.00)
Glucose, Bld: 111 mg/dL — ABNORMAL HIGH (ref 70–99)
POTASSIUM: 3.8 meq/L (ref 3.5–5.1)
SODIUM: 135 meq/L (ref 135–145)

## 2013-12-23 LAB — HEMOGLOBIN A1C: HEMOGLOBIN A1C: 7.2 % — AB (ref 4.6–6.5)

## 2013-12-23 LAB — LIPID PANEL
CHOLESTEROL: 159 mg/dL (ref 0–200)
HDL: 39.8 mg/dL (ref >39.00)
LDL CALC: 85 mg/dL (ref 0–99)
NonHDL: 119.2
TRIGLYCERIDES: 170 mg/dL — AB (ref 0.0–149.0)
Total CHOL/HDL Ratio: 4
VLDL: 34 mg/dL (ref 0.0–40.0)

## 2013-12-23 NOTE — Patient Instructions (Signed)
Follow up in 3-4 months to recheck diabetes We'll notify you of your lab results and make any changes if needed Keep up the good work on healthy diet and regular exercise Call with any questions or concerns Happy Fall!!

## 2013-12-23 NOTE — Progress Notes (Signed)
Pre visit review using our clinic review tool, if applicable. No additional management support is needed unless otherwise documented below in the visit note. 

## 2013-12-23 NOTE — Assessment & Plan Note (Signed)
Chronic problem.  Pt reports labile CBGs depending on diet.  On ACE for renal protection.  UTD on eye exam.  Pt following regularly w/ podiatry.  Asymptomatic.  Check labs.  Adjust meds prn.

## 2013-12-23 NOTE — Assessment & Plan Note (Signed)
Chronic problem.  Tolerating statin w/o difficulty.  Check labs.  Adjust meds prn  

## 2013-12-23 NOTE — Assessment & Plan Note (Signed)
Chronic problem.  Well controlled.  Asymptomatic.  Check labs.  No anticipated med changes. 

## 2013-12-23 NOTE — Progress Notes (Signed)
   Subjective:    Patient ID: Sandra Ray, female    DOB: [birth year], 69 y.o.   MRN: [id number]  HPI HTN- chronic problem, well controlled on HCTZ, lisinopril.  Denies CP, SOB, HAs, visual changes, edema.  Hyperlipidemia- chronic problem, on fenofibrate and Simvastatin.  Denies abd pain, N/V, myalgias.  DM- chronic problem, on Janumet and glipizide.  On ACE.  UTD on eye exam (Winter 2015).  Denies symptomatic lows.  No N/V/D.  CBGs 'pretty good'.  Following w/ podiatry regularly   Review of Systems For ROS see HPI     Objective:   Physical Exam  Vitals reviewed. Constitutional: She is oriented to person, place, and time. She appears well-developed and well-nourished. No distress.  HENT:  Head: Normocephalic and atraumatic.  Eyes: Conjunctivae and EOM are normal. Pupils are equal, round, and reactive to light.  Neck: Normal range of motion. Neck supple. No thyromegaly present.  Cardiovascular: Normal rate, regular rhythm, normal heart sounds and intact distal pulses.   No murmur heard. Pulmonary/Chest: Effort normal and breath sounds normal. No respiratory distress.  Abdominal: Soft. She exhibits no distension. There is no tenderness.  Musculoskeletal: She exhibits no edema.  Lymphadenopathy:    She has no cervical adenopathy.  Neurological: She is alert and oriented to person, place, and time.  Skin: Skin is warm and dry.  Psychiatric: She has a normal mood and affect. Her behavior is normal.          Assessment & Plan:

## 2013-12-26 ENCOUNTER — Other Ambulatory Visit: Payer: Self-pay | Admitting: General Practice

## 2013-12-26 MED ORDER — GABAPENTIN 300 MG PO CAPS: ORAL_CAPSULE | ORAL | Status: DC

## 2013-12-29 ENCOUNTER — Other Ambulatory Visit: Payer: Self-pay | Admitting: General Practice

## 2013-12-29 DIAGNOSIS — I10 Essential (primary) hypertension: Secondary | ICD-10-CM

## 2013-12-29 MED ORDER — HYDROCHLOROTHIAZIDE 25 MG PO TABS: 25.0000 mg | ORAL_TABLET | Freq: Every day | ORAL | Status: DC

## 2013-12-31 ENCOUNTER — Other Ambulatory Visit: Payer: Self-pay | Admitting: General Practice

## 2013-12-31 MED ORDER — GABAPENTIN 300 MG PO CAPS: ORAL_CAPSULE | ORAL | Status: DC

## 2014-01-07 ENCOUNTER — Ambulatory Visit (INDEPENDENT_AMBULATORY_CARE_PROVIDER_SITE_OTHER): Payer: Medicare Other | Admitting: Podiatry

## 2014-01-07 ENCOUNTER — Encounter: Payer: Self-pay | Admitting: Podiatry

## 2014-01-07 DIAGNOSIS — M21969 Unspecified acquired deformity of unspecified lower leg: Secondary | ICD-10-CM | POA: Diagnosis not present

## 2014-01-07 DIAGNOSIS — M7741 Metatarsalgia, right foot: Secondary | ICD-10-CM

## 2014-01-07 DIAGNOSIS — M2041 Other hammer toe(s) (acquired), right foot: Secondary | ICD-10-CM

## 2014-01-07 NOTE — Patient Instructions (Signed)
Both feet casted for orthotics. Will call when they are ready.  

## 2014-01-07 NOTE — Progress Notes (Signed)
Subjective: 68 year old female presents accompanied by her husband complaining of pain and swelling in 2nd toe right foot for duration of over 3-4 months.  Initially she thought this was a part of ingrown nail problem. The toe nail feels fine but the left foot is still swollen and 2nd toe is very painful.  Objective: Normal neurovascular status.  Mild forefoot edema proximal to 2nd digit right. Orthopedic: Weak or loss of Tibialis Posterior tendon left. Lowering MLA left. Swollen 2nd MPJ, contracted 2nd toe left. Hallux valgus with bunion left. Excess STJ pronation on left.  Radiographic examination show short first ray bilateral, long 2nd metatarsal bilateral, elevated first ray bilateral L>R.  Assessment: Resolved ingrown nail right great toe.  Ankle Equinus bilateral, slight improvement. 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 Hypermobile first ray with severe STJ hyperpronation left. Painful Hammer toe 2nd right. Capsulitis 2nd MPJ right. HAV with bunion.  Plan: Reviewed clinical findings and available options.  Both feet casted for orthotics. Patient does not want cortisone injection.  If orthotics not effective, it may require surgical options, Cotton/ 2nd met osteotomy/ or HT repair 2nd left.

## 2014-01-26 ENCOUNTER — Encounter: Payer: Self-pay | Admitting: Family Medicine

## 2014-01-26 ENCOUNTER — Ambulatory Visit (INDEPENDENT_AMBULATORY_CARE_PROVIDER_SITE_OTHER): Payer: Medicare Other | Admitting: Family Medicine

## 2014-01-26 VITALS — BP 169/71 | HR 92 | Temp 98.1°F | Wt 206.8 lb

## 2014-01-26 DIAGNOSIS — J0100 Acute maxillary sinusitis, unspecified: Secondary | ICD-10-CM | POA: Diagnosis not present

## 2014-01-26 MED ORDER — ALBUTEROL SULFATE (2.5 MG/3ML) 0.083% IN NEBU
2.5000 mg | INHALATION_SOLUTION | Freq: Once | RESPIRATORY_TRACT | Status: AC
  Administered 2014-01-26: 2.5 mg via RESPIRATORY_TRACT

## 2014-01-26 MED ORDER — AMOXICILLIN 875 MG PO TABS: 875.0000 mg | ORAL_TABLET | Freq: Two times a day (BID) | ORAL | Status: DC

## 2014-01-26 MED ORDER — GUAIFENESIN-CODEINE 100-10 MG/5ML PO SYRP: 10.0000 mL | ORAL_SOLUTION | Freq: Three times a day (TID) | ORAL | Status: DC | PRN

## 2014-01-26 NOTE — Progress Notes (Signed)
   Subjective:    Patient ID: Sandra Ray, female    DOB: 1945-08-08, 68 y.o.   MRN: [id number]  HPI URI- sxs started 5-6 days ago.  Progressively worsening.  + hacking cough- not productive.  + nasal congestion, sinus pain/pressure.  Bilateral ear fullness.  No tooth pain.  + SOB due to cough.  Subjective fever last week.  + sick contacts.  + chest soreness due to cough.   Review of Systems For ROS see HPI     Objective:   Physical Exam  Constitutional: She appears well-developed and well-nourished. No distress.  HENT:  Head: Normocephalic and atraumatic.  Right Ear: Tympanic membrane normal.  Left Ear: Tympanic membrane normal.  Nose: Mucosal edema and rhinorrhea present. Right sinus exhibits maxillary sinus tenderness. Right sinus exhibits no frontal sinus tenderness. Left sinus exhibits maxillary sinus tenderness. Left sinus exhibits no frontal sinus tenderness.  Mouth/Throat: Uvula is midline and mucous membranes are normal. Posterior oropharyngeal erythema present. No oropharyngeal exudate.  Eyes: Conjunctivae and EOM are normal. Pupils are equal, round, and reactive to light.  Neck: Normal range of motion. Neck supple.  Cardiovascular: Normal rate, regular rhythm and normal heart sounds.   Pulmonary/Chest: Effort normal. No respiratory distress. She has wheezes (diffuse expiratory wheezes- improved s/p neb tx).  + hacking cough  Lymphadenopathy:    She has no cervical adenopathy.  Vitals reviewed.         Assessment & Plan:

## 2014-01-26 NOTE — Progress Notes (Signed)
Pre visit review using our clinic review tool, if applicable. No additional management support is needed unless otherwise documented below in the visit note. 

## 2014-01-26 NOTE — Patient Instructions (Signed)
Follow up as needed Start the Amoxicillin twice daily- take w/ food Drink plenty of fluids Cheratussin for night/weekend cough- will cause drowsiness Mucinex DM for daytime cough REST! Call with any questions or concerns Hang in there! Happy Thanksgiving!

## 2014-01-27 NOTE — Assessment & Plan Note (Signed)
Pt's sxs consistent w/ maxillary sinusitis.  Also w/ hacking cough and wheezing that improved s/p neb tx- so likely component of reactive airway disease.  Cough meds prn.  Reviewed supportive care and red flags that should prompt return.  Pt expressed understanding and is in agreement w/ plan.

## 2014-02-24 ENCOUNTER — Telehealth: Payer: Self-pay | Admitting: Family Medicine

## 2014-02-24 MED ORDER — LINAGLIPTIN-METFORMIN HCL 2.5-1000 MG PO TABS: 1.0000 | ORAL_TABLET | Freq: Two times a day (BID) | ORAL | Status: DC

## 2014-02-24 NOTE — Telephone Encounter (Signed)
2.5mg /1000mg  BID

## 2014-02-24 NOTE — Telephone Encounter (Signed)
Caller name: Saraiah, Bhat Relation to pt: self  Call back number: [phone]  Reason for call:  Pt is in the donut hole for sitaGLIPtin-metformin (JANUMET) 50-1000 MG per tablet and as per insurance beginning January 1st insurance will not cover. Pt would like to discuss alternative drugs tradjenta and jentadueto. As of the 24th of December can you prescribe one of these alternatives that the insurance recommend.

## 2014-02-24 NOTE — Telephone Encounter (Signed)
Med filled and pt notified.  

## 2014-02-24 NOTE — Telephone Encounter (Signed)
Please advise on the dosage. The are three different possibilities to choose from

## 2014-02-24 NOTE — Telephone Encounter (Signed)
I would recommend switching to [facility] as this is the closest to [facility]

## 2014-03-18 ENCOUNTER — Telehealth: Payer: Self-pay | Admitting: Family Medicine

## 2014-03-18 MED ORDER — FENOFIBRATE MICRONIZED 130 MG PO CAPS: ORAL_CAPSULE | ORAL | Status: DC

## 2014-03-18 MED ORDER — GLIPIZIDE 10 MG PO TABS: 20.0000 mg | ORAL_TABLET | Freq: Two times a day (BID) | ORAL | Status: DC

## 2014-03-18 NOTE — Telephone Encounter (Signed)
Caller name:Dick Chinita Relation to PF:YTWK Call back number:[phone] Pharmacy:[facility] on [address]  Reason for call: pt is needing rx glipiZIDE (GLUCOTROL) 10 MG tablet and fenofibrate micronized (ANTARA) 130 MG capsule

## 2014-03-18 NOTE — Telephone Encounter (Signed)
Medication filled.  

## 2014-03-30 ENCOUNTER — Ambulatory Visit: Payer: Medicare Other | Admitting: Family Medicine

## 2014-03-31 ENCOUNTER — Ambulatory Visit: Payer: Medicare Other | Admitting: Family Medicine

## 2014-04-07 ENCOUNTER — Ambulatory Visit: Payer: Medicare Other | Admitting: Family Medicine

## 2014-04-24 ENCOUNTER — Telehealth: Payer: Self-pay | Admitting: Family Medicine

## 2014-04-24 DIAGNOSIS — E7800 Pure hypercholesterolemia, unspecified: Secondary | ICD-10-CM

## 2014-04-24 DIAGNOSIS — E876 Hypokalemia: Secondary | ICD-10-CM

## 2014-04-24 DIAGNOSIS — I10 Essential (primary) hypertension: Secondary | ICD-10-CM

## 2014-04-24 MED ORDER — SIMVASTATIN 40 MG PO TABS: 40.0000 mg | ORAL_TABLET | Freq: Every day | ORAL | Status: DC

## 2014-04-24 MED ORDER — POTASSIUM CHLORIDE CRYS ER 20 MEQ PO TBCR: 20.0000 meq | EXTENDED_RELEASE_TABLET | Freq: Every day | ORAL | Status: DC

## 2014-04-24 MED ORDER — HYDROCHLOROTHIAZIDE 25 MG PO TABS: 25.0000 mg | ORAL_TABLET | Freq: Every day | ORAL | Status: DC

## 2014-04-24 MED ORDER — SITAGLIPTIN PHOS-METFORMIN HCL 50-1000 MG PO TABS: ORAL_TABLET | ORAL | Status: DC

## 2014-04-24 NOTE — Telephone Encounter (Signed)
meds filled and letter mailed to pt.

## 2014-04-24 NOTE — Telephone Encounter (Signed)
Caller name: Quita, Mcgrory Relation to pt: self  Call back number: [phone] Pharmacy: [facility] [place] ([place] [place] [address] [phone] ([phone] (Fax)         Reason for call: pt requesting a refill   sitaGLIPtin-metformin (JANUMET) 50-1000 MG per tablet  hydrochlorothiazide (HYDRODIURIL) 25 MG tablet  simvastatin (ZOCOR) 40 MG tablet  potassium chloride SA (K-DUR,KLOR-CON) 20 MEQ tablet (completely out)

## 2014-05-22 ENCOUNTER — Other Ambulatory Visit: Payer: Self-pay | Admitting: General Practice

## 2014-05-22 ENCOUNTER — Encounter: Payer: Self-pay | Admitting: Family Medicine

## 2014-05-22 ENCOUNTER — Ambulatory Visit (INDEPENDENT_AMBULATORY_CARE_PROVIDER_SITE_OTHER): Payer: Medicare Other | Admitting: Family Medicine

## 2014-05-22 VITALS — BP 130/72 | HR 68 | Temp 98.0°F | Resp 16 | Wt 202.5 lb

## 2014-05-22 DIAGNOSIS — E785 Hyperlipidemia, unspecified: Secondary | ICD-10-CM

## 2014-05-22 DIAGNOSIS — E1140 Type 2 diabetes mellitus with diabetic neuropathy, unspecified: Secondary | ICD-10-CM | POA: Diagnosis not present

## 2014-05-22 DIAGNOSIS — E1169 Type 2 diabetes mellitus with other specified complication: Secondary | ICD-10-CM

## 2014-05-22 DIAGNOSIS — I10 Essential (primary) hypertension: Secondary | ICD-10-CM | POA: Diagnosis not present

## 2014-05-22 LAB — CBC WITH DIFFERENTIAL/PLATELET
Basophils Absolute: 0.1 10*3/uL (ref 0.0–0.1)
Basophils Relative: 1.1 % (ref 0.0–3.0)
Eosinophils Absolute: 0.5 10*3/uL (ref 0.0–0.7)
Eosinophils Relative: 6.3 % — ABNORMAL HIGH (ref 0.0–5.0)
HCT: 38.2 % (ref 36.0–46.0)
Hemoglobin: 12.8 g/dL (ref 12.0–15.0)
Lymphocytes Relative: 34.8 % (ref 12.0–46.0)
Lymphs Abs: 2.8 10*3/uL (ref 0.7–4.0)
MCHC: 33.6 g/dL (ref 30.0–36.0)
MCV: 87.9 fl (ref 78.0–100.0)
Monocytes Absolute: 0.8 10*3/uL (ref 0.1–1.0)
Monocytes Relative: 10.1 % (ref 3.0–12.0)
NEUTROS PCT: 47.7 % (ref 43.0–77.0)
Neutro Abs: 3.9 10*3/uL (ref 1.4–7.7)
Platelets: 309 10*3/uL (ref 150.0–400.0)
RBC: 4.35 Mil/uL (ref 3.87–5.11)
RDW: 14.5 % (ref 11.5–15.5)
WBC: 8.2 10*3/uL (ref 4.0–10.5)

## 2014-05-22 LAB — BASIC METABOLIC PANEL
BUN: 25 mg/dL — ABNORMAL HIGH (ref 6–23)
CHLORIDE: 104 meq/L (ref 96–112)
CO2: 28 meq/L (ref 19–32)
CREATININE: 0.79 mg/dL (ref 0.40–1.20)
Calcium: 9.8 mg/dL (ref 8.4–10.5)
GFR: 76.74 mL/min (ref >60.00)
Glucose, Bld: 91 mg/dL (ref 70–99)
Potassium: 4.1 mEq/L (ref 3.5–5.1)
SODIUM: 138 meq/L (ref 135–145)

## 2014-05-22 LAB — TSH: TSH: 1.58 u[IU]/mL (ref 0.35–4.50)

## 2014-05-22 LAB — HEPATIC FUNCTION PANEL
ALBUMIN: 4.5 g/dL (ref 3.5–5.2)
ALK PHOS: 35 U/L — AB (ref 39–117)
ALT: 18 U/L (ref 0–35)
AST: 20 U/L (ref 0–37)
Bilirubin, Direct: 0.1 mg/dL (ref 0.0–0.3)
Total Bilirubin: 0.4 mg/dL (ref 0.2–1.2)
Total Protein: 6.9 g/dL (ref 6.0–8.3)

## 2014-05-22 LAB — LIPID PANEL
CHOL/HDL RATIO: 3
Cholesterol: 164 mg/dL (ref 0–200)
HDL: 49.8 mg/dL (ref >39.00)
LDL Cholesterol: 81 mg/dL (ref 0–99)
NONHDL: 114.2
TRIGLYCERIDES: 166 mg/dL — AB (ref 0.0–149.0)
VLDL: 33.2 mg/dL (ref 0.0–40.0)

## 2014-05-22 LAB — HEMOGLOBIN A1C: Hgb A1c MFr Bld: 7.3 % — ABNORMAL HIGH (ref 4.6–6.5)

## 2014-05-22 MED ORDER — LINAGLIPTIN-METFORMIN HCL 2.5-1000 MG PO TABS: 1.0000 | ORAL_TABLET | Freq: Two times a day (BID) | ORAL | Status: DC

## 2014-05-22 NOTE — Patient Instructions (Signed)
Schedule your complete physical in 3-4 months We'll notify you of your lab results and make any changes if needed Schedule your eye exam for next month Call with any questions or concerns Keep up the good work! Happy Spring!!!

## 2014-05-22 NOTE — Progress Notes (Signed)
Pre visit review using our clinic review tool, if applicable. No additional management support is needed unless otherwise documented below in the visit note. 

## 2014-05-22 NOTE — Progress Notes (Signed)
   Subjective:    Patient ID: Sandra Ray, female    DOB: [birth year], 69 y.o.   MRN: [id number]  HPI DM- chronic problem.  Pt has switched from Janumet to Combs due to insurance requirements.  Also on Glipizide.  Due for eye exam next month.  On Lisinopril for renal protection.  Denies symptomatic lows.  Stable neuropathy.  Has lost 5 more lbs!!  HTN- chronic problem, BP well controlled w/ HCTZ, Lisinopril.  Denies CP, SOB, HAs, visual changes, edema.  Hyperlipidemia- chronic problem, on Simvastatin and fenofibrate.  Denies abd pain, N/V, myalgias.   Review of Systems For ROS see HPI   Reviewed meds, allergies, problem list, and PMH in chart     Objective:   Physical Exam  Constitutional: She is oriented to person, place, and time. She appears well-developed and well-nourished. No distress.  HENT:  Head: Normocephalic and atraumatic.  Eyes: Conjunctivae and EOM are normal. Pupils are equal, round, and reactive to light.  Neck: Normal range of motion. Neck supple. No thyromegaly present.  Cardiovascular: Normal rate, regular rhythm, normal heart sounds and intact distal pulses.   No murmur heard. Pulmonary/Chest: Effort normal and breath sounds normal. No respiratory distress.  Abdominal: Soft. She exhibits no distension. There is no tenderness.  Musculoskeletal: She exhibits no edema.  Lymphadenopathy:    She has no cervical adenopathy.  Neurological: She is alert and oriented to person, place, and time.  Skin: Skin is warm and dry.  Psychiatric: She has a normal mood and affect. Her behavior is normal.  Vitals reviewed.         Assessment & Plan:

## 2014-05-22 NOTE — Assessment & Plan Note (Signed)
Chronic problem.  Tolerating statin w/o difficulty.  Check labs.  Adjust meds prn  

## 2014-05-22 NOTE — Assessment & Plan Note (Signed)
Chronic problem.  Adequate control on current meds.  Asymptomatic.  Check labs.  No anticipated med changes. 

## 2014-05-22 NOTE — Assessment & Plan Note (Signed)
Chronic problem.  Currently asymptomatic w/ exception of stable neuropathy.  Due for eye exam next month.  UTD on podiatry.  On ACE for renal protection.  Check labs.  Adjust meds prn

## 2014-05-25 ENCOUNTER — Other Ambulatory Visit: Payer: Self-pay | Admitting: General Practice

## 2014-05-25 MED ORDER — LISINOPRIL 5 MG PO TABS: ORAL_TABLET | ORAL | Status: DC

## 2014-06-02 ENCOUNTER — Other Ambulatory Visit: Payer: Self-pay | Admitting: Family Medicine

## 2014-07-10 ENCOUNTER — Telehealth: Payer: Self-pay | Admitting: Family Medicine

## 2014-07-10 MED ORDER — GLIPIZIDE 10 MG PO TABS: 20.0000 mg | ORAL_TABLET | Freq: Two times a day (BID) | ORAL | Status: DC

## 2014-07-10 NOTE — Telephone Encounter (Signed)
Caller name: Shayda Relation to pt: self  Call back number: [phone] Pharmacy: [facility] on [address]  Reason for call:   Requesting glipizide refill

## 2014-07-10 NOTE — Telephone Encounter (Signed)
Med filled.  

## 2014-07-13 ENCOUNTER — Telehealth: Payer: Self-pay | Admitting: Family Medicine

## 2014-07-13 MED ORDER — GLIPIZIDE 10 MG PO TABS: 20.0000 mg | ORAL_TABLET | Freq: Two times a day (BID) | ORAL | Status: DC

## 2014-07-13 NOTE — Telephone Encounter (Signed)
Relation to pt: self Call back number: [phone] Pharmacy: [facility] ([place]), [place]  Reason for call:  Pt states only 2 week supply of glipiZIDE (GLUCOTROL) was sent in, would like to know why.

## 2014-07-13 NOTE — Telephone Encounter (Signed)
error:[id number] ° °

## 2014-07-13 NOTE — Telephone Encounter (Signed)
Med filled.  

## 2014-07-21 ENCOUNTER — Other Ambulatory Visit: Payer: Self-pay | Admitting: General Practice

## 2014-07-21 MED ORDER — PROMETHAZINE HCL 25 MG PO TABS: 25.0000 mg | ORAL_TABLET | Freq: Four times a day (QID) | ORAL | Status: DC | PRN

## 2014-07-23 ENCOUNTER — Other Ambulatory Visit: Payer: Self-pay | Admitting: Family Medicine

## 2014-07-23 NOTE — Telephone Encounter (Signed)
Med filled.  

## 2014-07-24 ENCOUNTER — Other Ambulatory Visit: Payer: Self-pay | Admitting: Family Medicine

## 2014-07-24 DIAGNOSIS — I10 Essential (primary) hypertension: Secondary | ICD-10-CM

## 2014-07-27 MED ORDER — HYDROCHLOROTHIAZIDE 25 MG PO TABS: 25.0000 mg | ORAL_TABLET | Freq: Every day | ORAL | Status: DC

## 2014-07-27 NOTE — Telephone Encounter (Signed)
Med filled.  

## 2014-09-04 ENCOUNTER — Telehealth: Payer: Self-pay | Admitting: Family Medicine

## 2014-09-04 MED ORDER — CEPHALEXIN 500 MG PO CAPS: 500.0000 mg | ORAL_CAPSULE | Freq: Two times a day (BID) | ORAL | Status: DC

## 2014-09-04 NOTE — Telephone Encounter (Signed)
[place] for keflex 500mg  BID x5 days.  If sxs persist after treatment, will need OV

## 2014-09-04 NOTE — Telephone Encounter (Signed)
Relation to pt: self Call back number: [phone] Pharmacy: [facility] [address] 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 Please advise

## 2014-09-04 NOTE — Telephone Encounter (Signed)
Patient calling again on this.

## 2014-09-04 NOTE — Telephone Encounter (Signed)
Med filled and pt notified.  

## 2014-09-10 ENCOUNTER — Other Ambulatory Visit: Payer: Self-pay

## 2014-09-10 DIAGNOSIS — Z1231 Encounter for screening mammogram for malignant neoplasm of breast: Secondary | ICD-10-CM

## 2014-09-17 ENCOUNTER — Encounter: Payer: Medicare Other | Admitting: Family Medicine

## 2014-10-20 ENCOUNTER — Ambulatory Visit
Admission: RE | Admit: 2014-10-20 | Discharge: 2014-10-20 | Disposition: A | Discharge status: Home / Self Care | Payer: Medicare Other | Source: Ambulatory Visit

## 2014-10-20 DIAGNOSIS — Z1231 Encounter for screening mammogram for malignant neoplasm of breast: Secondary | ICD-10-CM

## 2014-10-21 ENCOUNTER — Encounter: Payer: Self-pay | Admitting: Behavioral Health

## 2014-10-21 ENCOUNTER — Telehealth: Payer: Self-pay | Admitting: Behavioral Health

## 2014-10-21 NOTE — Telephone Encounter (Signed)
Pre-Visit Call completed with patient and chart updated.   Pre-Visit Info documented in Specialty Comments under [employer].    

## 2014-10-22 ENCOUNTER — Encounter: Payer: Self-pay | Admitting: Internal Medicine

## 2014-10-22 ENCOUNTER — Encounter: Payer: Self-pay | Admitting: Family Medicine

## 2014-10-22 ENCOUNTER — Ambulatory Visit (INDEPENDENT_AMBULATORY_CARE_PROVIDER_SITE_OTHER): Payer: Medicare Other | Admitting: Family Medicine

## 2014-10-22 VITALS — BP 130/70 | HR 64 | Temp 98.0°F | Resp 16 | Ht 70.0 in | Wt 209.5 lb

## 2014-10-22 DIAGNOSIS — E1169 Type 2 diabetes mellitus with other specified complication: Secondary | ICD-10-CM | POA: Diagnosis not present

## 2014-10-22 DIAGNOSIS — E785 Hyperlipidemia, unspecified: Secondary | ICD-10-CM | POA: Diagnosis not present

## 2014-10-22 DIAGNOSIS — Z1211 Encounter for screening for malignant neoplasm of colon: Secondary | ICD-10-CM | POA: Diagnosis not present

## 2014-10-22 DIAGNOSIS — E1140 Type 2 diabetes mellitus with diabetic neuropathy, unspecified: Secondary | ICD-10-CM

## 2014-10-22 DIAGNOSIS — Z0000 Encounter for general adult medical examination without abnormal findings: Secondary | ICD-10-CM

## 2014-10-22 DIAGNOSIS — I10 Essential (primary) hypertension: Secondary | ICD-10-CM

## 2014-10-22 LAB — BASIC METABOLIC PANEL
BUN: 21 mg/dL (ref 6–23)
CALCIUM: 9.9 mg/dL (ref 8.4–10.5)
CO2: 25 mEq/L (ref 19–32)
CREATININE: 0.74 mg/dL (ref 0.40–1.20)
Chloride: 101 mEq/L (ref 96–112)
GFR: 82.65 mL/min (ref >60.00)
Glucose, Bld: 112 mg/dL — ABNORMAL HIGH (ref 70–99)
Potassium: 4 mEq/L (ref 3.5–5.1)
Sodium: 138 mEq/L (ref 135–145)

## 2014-10-22 LAB — CBC WITH DIFFERENTIAL/PLATELET
Basophils Absolute: 0.2 10*3/uL — ABNORMAL HIGH (ref 0.0–0.1)
Basophils Relative: 2.9 % (ref 0.0–3.0)
EOS ABS: 0.5 10*3/uL (ref 0.0–0.7)
Eosinophils Relative: 5.6 % — ABNORMAL HIGH (ref 0.0–5.0)
HCT: 39.5 % (ref 36.0–46.0)
HEMOGLOBIN: 13.1 g/dL (ref 12.0–15.0)
LYMPHS PCT: 35.2 % (ref 12.0–46.0)
Lymphs Abs: 3 10*3/uL (ref 0.7–4.0)
MCHC: 33.1 g/dL (ref 30.0–36.0)
MCV: 88.9 fl (ref 78.0–100.0)
MONOS PCT: 10.3 % (ref 3.0–12.0)
Monocytes Absolute: 0.9 10*3/uL (ref 0.1–1.0)
NEUTROS ABS: 3.9 10*3/uL (ref 1.4–7.7)
Neutrophils Relative %: 46 % (ref 43.0–77.0)
PLATELETS: 326 10*3/uL (ref 150.0–400.0)
RBC: 4.45 Mil/uL (ref 3.87–5.11)
RDW: 13.9 % (ref 11.5–15.5)
WBC: 8.5 10*3/uL (ref 4.0–10.5)

## 2014-10-22 LAB — LIPID PANEL
CHOL/HDL RATIO: 4
CHOLESTEROL: 166 mg/dL (ref 0–200)
HDL: 42.8 mg/dL (ref >39.00)
NonHDL: 123.45
TRIGLYCERIDES: 203 mg/dL — AB (ref 0.0–149.0)
VLDL: 40.6 mg/dL — ABNORMAL HIGH (ref 0.0–40.0)

## 2014-10-22 LAB — HEMOGLOBIN A1C: Hgb A1c MFr Bld: 7.6 % — ABNORMAL HIGH (ref 4.6–6.5)

## 2014-10-22 LAB — LDL CHOLESTEROL, DIRECT: LDL DIRECT: 93 mg/dL

## 2014-10-22 LAB — HEPATIC FUNCTION PANEL
ALBUMIN: 4.5 g/dL (ref 3.5–5.2)
ALK PHOS: 38 U/L — AB (ref 39–117)
ALT: 22 U/L (ref 0–35)
AST: 26 U/L (ref 0–37)
Bilirubin, Direct: 0.1 mg/dL (ref 0.0–0.3)
Total Bilirubin: 0.5 mg/dL (ref 0.2–1.2)
Total Protein: 7.3 g/dL (ref 6.0–8.3)

## 2014-10-22 LAB — TSH: TSH: 0.59 u[IU]/mL (ref 0.35–4.50)

## 2014-10-22 NOTE — Progress Notes (Signed)
   Subjective:    Patient ID: Sandra Ray, female    DOB: [birth year], 69 y.o.   MRN: [id number]  HPI Here today for CPE.  Risk Factors: DM- chronic problem, on Glipizide, Linagliptin-Metformin.  On ACE for renal protection.  Overdue on eye exam.  Known neuropathy.  Has gained 6 lbs since January.  Denies symptomatic lows. HTN- chronic problem, on Lisinopril, HCTZ w/ good control.  Denies CP, SOB, HAs, visual changes, edema. Hyperlipidemia- chronic problem, on Fenofibrate, Simvastatin.  Denies abd pain, N/V, myalgias Physical Activity: limited exercise but very active Fall Risk: low risk Depression: denies current sxs Hearing: normal to conversational tones, decreased to whispered voice ADL's: independent Cognitive: normal linear thought process Home Safety: safe at home, lives w/ husband Height, Weight, BMI, Visual Acuity: see vitals, vision corrected to 20/20 w/ glasses Counseling: due for colonoscopy, UTD on mammo, DEXA. Care team reviewed and updated Labs Ordered: See A&P Care Plan: See A&P    Review of Systems Patient reports no vision/ hearing changes, adenopathy,fever, weight change,  persistant/recurrent hoarseness , swallowing issues, chest pain, palpitations, edema, persistant/recurrent cough, hemoptysis, dyspnea (rest/exertional/paroxysmal nocturnal), gastrointestinal bleeding (melena, rectal bleeding), abdominal pain, significant heartburn, bowel changes, GU symptoms (dysuria, hematuria, incontinence), Gyn symptoms (abnormal  bleeding, pain),  syncope, focal weakness, memory loss, numbness & tingling above baseline, skin/hair/nail changes, abnormal bruising or bleeding, anxiety, or depression.     Objective:   Physical Exam General Appearance:    Alert, cooperative, no distress, appears stated age  Head:    Normocephalic, without obvious abnormality, atraumatic  Eyes:    PERRL, conjunctiva/corneas clear, EOM's intact, fundi    benign, both eyes  Ears:    Normal TM's and  external ear canals, both ears  Nose:   Nares normal, septum midline, mucosa normal, no drainage    or sinus tenderness  Throat:   Lips, mucosa, and tongue normal; teeth and gums normal  Neck:   Supple, symmetrical, trachea midline, no adenopathy;    Thyroid: no enlargement/tenderness/nodules  Back:     Symmetric, no curvature, ROM normal, no CVA tenderness  Lungs:     Clear to auscultation bilaterally, respirations unlabored  Chest Wall:    No tenderness or deformity   Heart:    Regular rate and rhythm, S1 and S2 normal, no murmur, rub   or gallop  Breast Exam:    Deferred to mammo  Abdomen:     Soft, non-tender, bowel sounds active all four quadrants,    no masses, no organomegaly  Genitalia:    Deferred  Rectal:    Extremities:   Extremities normal, atraumatic, no cyanosis or edema  Pulses:   2+ and symmetric all extremities  Skin:   Skin color, texture, turgor normal, no rashes or lesions  Lymph nodes:   Cervical, supraclavicular, and axillary nodes normal  Neurologic:   CNII-XII intact, normal strength, sensation and reflexes    throughout          Assessment & Plan:

## 2014-10-22 NOTE — Assessment & Plan Note (Signed)
Pt's PE WNL w/ exception of obesity.  UTD on mammo and DEXA and immunizations.  Due for colonoscopy- referral entered.  Written screening schedule updated and given to pt.  Check labs.  Anticipatory guidance provided.

## 2014-10-22 NOTE — Assessment & Plan Note (Signed)
Chronic problem.  Hx of good control.  Asymptomatic.  Due for eye exam- encouraged to schedule.  On ACE for renal protection.  Known neuropathy but not worsening.  Check labs.  Adjust meds prn

## 2014-10-22 NOTE — Progress Notes (Signed)
Pre visit review using our clinic review tool, if applicable. No additional management support is needed unless otherwise documented below in the visit note. 

## 2014-10-22 NOTE — Patient Instructions (Signed)
Follow up in 3-4 months to recheck diabetes We'll notify you of your lab results and make any changes if needed Keep up the good work!  You look great! We'll call you with your GI appt You are up to date on mammo and bone density- yay!! Please call and schedule your eye exam Call with any questions or concerns Enjoy the rest of your summer!!!

## 2014-10-22 NOTE — Assessment & Plan Note (Signed)
Chronic problem.  Well controlled.  Asymptomatic.  Check labs.  No anticipated med changes. 

## 2014-10-22 NOTE — Assessment & Plan Note (Signed)
Chronic problem.  Tolerating statin w/o difficulty.  Check labs.  Adjust meds prn  

## 2014-10-29 ENCOUNTER — Other Ambulatory Visit: Payer: Self-pay | Admitting: Family Medicine

## 2014-10-29 NOTE — Telephone Encounter (Signed)
Medication filled to pharmacy as requested.   

## 2014-11-01 ENCOUNTER — Other Ambulatory Visit: Payer: Self-pay | Admitting: Family Medicine

## 2014-11-02 NOTE — Telephone Encounter (Signed)
Medication filled to pharmacy as requested.   

## 2014-11-30 ENCOUNTER — Other Ambulatory Visit: Payer: Self-pay | Admitting: Family Medicine

## 2014-11-30 NOTE — Telephone Encounter (Signed)
Medication filled to pharmacy as requested.   

## 2014-12-01 ENCOUNTER — Ambulatory Visit (INDEPENDENT_AMBULATORY_CARE_PROVIDER_SITE_OTHER): Payer: Medicare Other | Admitting: Internal Medicine

## 2014-12-01 ENCOUNTER — Encounter: Payer: Self-pay | Admitting: Internal Medicine

## 2014-12-01 VITALS — BP 144/66 | HR 80 | Ht 67.5 in | Wt 210.1 lb

## 2014-12-01 DIAGNOSIS — K660 Peritoneal adhesions (postprocedural) (postinfection): Secondary | ICD-10-CM

## 2014-12-01 DIAGNOSIS — Z86010 Personal history of colonic polyps: Secondary | ICD-10-CM | POA: Diagnosis not present

## 2014-12-01 DIAGNOSIS — Z9283 Personal history of failed moderate sedation: Secondary | ICD-10-CM

## 2014-12-01 NOTE — Patient Instructions (Signed)
   You have been scheduled for a colonoscopy. Please follow written instructions given to you at your visit today.  Please pick up your over the counter prep supplies at the pharmacy. If you use inhalers (even only as needed), please bring them with you on the day of your procedure.   I appreciate the opportunity to care for you. Silvano Rusk, MD, [facility]

## 2014-12-01 NOTE — Progress Notes (Signed)
Subjective:    Patient ID: Sandra Ray, female    DOB: 06-01-1945, 69 y.o.   MRN: [id number] Cc: hx colon polyps - difficult colonoscopy in past HPI Very nice lady here re: scheduling surveuillance colonoscopy. She had 2 diminutive polyps removed 2004 - 5 yr f/u rec but had 3 back surgeries and did not come as recommended. Also had difficult scope insertion and failure of moderate sedation "I woke up". I had recommended pediatric scope and deep sedation on any future exams. No active GI c/o today Allergies  Allergen Reactions  . Erythromycin     Passed out while taking   Outpatient Prescriptions Prior to Visit  Medication Sig Dispense Refill  . acetaminophen (TYLENOL) 650 MG CR tablet Take 1,300 mg by mouth daily.     Marland Kitchen albuterol (VENTOLIN HFA) 108 (90 BASE) MCG/ACT inhaler Inhale 2 puffs into the lungs every 6 (six) hours as needed.      . Ascorbic Acid (VITAMIN C) 500 MG tablet Take 500 mg by mouth daily.      Marland Kitchen aspirin 81 MG tablet Take 81 mg by mouth daily.      . Cholecalciferol (VITAMIN D-3) 5000 UNITS TABS Take by mouth daily.     . Coenzyme Q10 100 MG TABS Take 400 mg by mouth daily.    . fenofibrate micronized (ANTARA) 130 MG capsule TAKE ONE CAPSULE BY MOUTH ONCE DAILY. 90 capsule 1  . gabapentin (NEURONTIN) 300 MG capsule TAKE ONE CAPSULE BY MOUTH THREE TIMES DAILY 90 capsule 6  . Garlic 2707 MG CAPS Take 3 capsules by mouth daily.    Marland Kitchen glipiZIDE (GLUCOTROL) 10 MG tablet Take 2 tablets (20 mg total) by mouth 2 (two) times daily before a meal. 120 tablet 6  . hydrochlorothiazide (HYDRODIURIL) 25 MG tablet Take 1 tablet (25 mg total) by mouth daily. 90 tablet 0  . KLOR-CON M20 20 MEQ tablet TAKE ONE TABLET BY MOUTH ONCE DAILY 90 tablet 1  . Linagliptin-Metformin HCl 2.07-998 MG TABS Take 1 tablet by mouth 2 (two) times daily. 180 tablet 1  . lisinopril (PRINIVIL,ZESTRIL) 5 MG tablet TAKE ONE TABLET BY MOUTH ONCE DAILY 90 tablet 1  . promethazine (PHENERGAN) 25 MG tablet Take 1  tablet (25 mg total) by mouth 4 (four) times daily as needed. nausea 90 tablet 1  . simvastatin (ZOCOR) 40 MG tablet TAKE ONE TABLET BY MOUTH AT BEDTIME 90 tablet 1  . vitamin E 400 UNIT capsule Take 400 Units by mouth daily.       No facility-administered medications prior to visit.   Past Medical History  Diagnosis Date  . Diabetes mellitus type II   . Hyperlipidemia   . Hypertension   . Degenerative disc disease   . Breast abscess     right breast  . Tubular adenoma of colon   . Carpal tunnel syndrome   . Eczema of hand   . Shingles   . Metatarsal deformity   . Arthritis   . Hx of colonic polyps 12/02/2014    2004 - 2 diminutive polyps    Past Surgical History  Procedure Laterality Date  . Lumbar laminectomy      [id number]. 2011 L4,L5,S1  . Cesarean section      x2  . Tubal ligation    . Abdominal hysterectomy  1981  . Carpel tunnel Right   . Abscess drainage Right     breast  . Appendectomy  1966  . Colonoscopy  Social History   Social History  . Marital Status: Married    Spouse Name: N/A  . Number of Children: 2  . Years of Education: N/A   Social History Main Topics  . Smoking status: Never Smoker   . Smokeless tobacco: Never Used  . Alcohol Use: No  . Drug Use: No    Social History Narrative   Married 2 daughters   Not working outside home   no caffeine   12/02/2014      Family History  Problem Relation Age of Onset  . Hypertension Father   . Diabetes Mother   . Stroke Mother   . Hypertension Mother   . High Cholesterol Mother   . Breast cancer Maternal Aunt   . Breast cancer Sister   . Diabetes Brother   . Breast cancer Daughter   . Colon polyps Brother   . Heart disease Mother    Review of Systems + back pain, joint pains, thirsty, myalgias, fatigue All other neg    Objective:   Physical Exam BP 144/66 mmHg  Pulse 80  Ht 5' 7.5" (1.715 m)  Wt 210 lb 2 oz (95.312 kg)  BMI 32.41 kg/m2 NAD Eyes anicteric  Alert and oriented  x 3 Appropriate mood an affect  Data review: 2004 colonoscopy     Assessment & Plan:  Hx of colonic polyps - Intestinal adhesions making insertion of colonoscope difficult (s/p appendectomy, C section x2, hysterectomy, tubal ligation) Personal history of failed moderate sedation  Plan for surveillance colonoscopy, The risks and benefits as well as alternatives of endoscopic procedure(s) have been discussed and reviewed. All questions answered. The patient agrees to proceed.  Will use pediatric scope and deep sedation.  I appreciate the opportunity to care for this patient.

## 2014-12-02 ENCOUNTER — Encounter: Payer: Self-pay | Admitting: Internal Medicine

## 2014-12-02 DIAGNOSIS — Z860100 Personal history of colon polyps, unspecified: Secondary | ICD-10-CM

## 2014-12-02 DIAGNOSIS — K660 Peritoneal adhesions (postprocedural) (postinfection): Secondary | ICD-10-CM | POA: Insufficient documentation

## 2014-12-02 DIAGNOSIS — Z9283 Personal history of failed moderate sedation: Secondary | ICD-10-CM | POA: Insufficient documentation

## 2014-12-02 HISTORY — DX: Personal history of colonic polyps: Z86.010

## 2014-12-02 HISTORY — DX: Personal history of colon polyps, unspecified: Z86.0100

## 2014-12-03 ENCOUNTER — Other Ambulatory Visit: Payer: Self-pay | Admitting: Family Medicine

## 2014-12-03 NOTE — Telephone Encounter (Signed)
Medication filled to pharmacy as requested.   

## 2014-12-08 ENCOUNTER — Other Ambulatory Visit: Payer: Self-pay | Admitting: Family Medicine

## 2014-12-08 NOTE — Telephone Encounter (Signed)
Medication filled to pharmacy as requested.   

## 2014-12-25 DIAGNOSIS — Z23 Encounter for immunization: Secondary | ICD-10-CM | POA: Diagnosis not present

## 2015-01-19 ENCOUNTER — Ambulatory Visit (AMBULATORY_SURGERY_CENTER): Payer: Medicare Other | Admitting: Internal Medicine

## 2015-01-19 ENCOUNTER — Encounter: Payer: Self-pay | Admitting: Internal Medicine

## 2015-01-19 VITALS — BP 135/78 | HR 61 | Temp 96.5°F | Resp 16 | Ht 67.5 in | Wt 210.0 lb

## 2015-01-19 DIAGNOSIS — E119 Type 2 diabetes mellitus without complications: Secondary | ICD-10-CM | POA: Diagnosis not present

## 2015-01-19 DIAGNOSIS — I10 Essential (primary) hypertension: Secondary | ICD-10-CM | POA: Diagnosis not present

## 2015-01-19 DIAGNOSIS — Z86010 Personal history of colonic polyps: Secondary | ICD-10-CM | POA: Diagnosis not present

## 2015-01-19 DIAGNOSIS — E669 Obesity, unspecified: Secondary | ICD-10-CM | POA: Diagnosis not present

## 2015-01-19 LAB — GLUCOSE, CAPILLARY
GLUCOSE-CAPILLARY: 147 mg/dL — AB (ref 65–99)
GLUCOSE-CAPILLARY: 130 mg/dL — AB (ref 65–99)

## 2015-01-19 MED ORDER — SODIUM CHLORIDE 0.9 % IV SOLN: 500.0000 mL | INTRAVENOUS | Status: DC

## 2015-01-19 NOTE — Progress Notes (Signed)
To recovery, report to McCoy, RN, VSS 

## 2015-01-19 NOTE — Op Note (Signed)
[place]  [address] [place], [address]   COLONOSCOPY PROCEDURE REPORT  PATIENT: Sandra, Ray  MR#: [id number] BIRTHDATE: 08/28/1945 , 69  yrs. old GENDER: female ENDOSCOPIST: Gatha Mayer, MD, [facility] PROCEDURE DATE:  01/19/2015 PROCEDURE:   Colonoscopy, surveillance First Screening Colonoscopy - Avg.  risk and is 50 yrs.  old or older - No.  Prior Negative Screening - Now for repeat screening. N/A  History of Adenoma - Now for follow-up colonoscopy & has been > or = to 3 yrs.  Yes hx of adenoma.  Has been 3 or more years since last colonoscopy.  Polyps removed today? No Recommend repeat exam, <10 yrs? No ASA CLASS:   Class III INDICATIONS:Surveillance due to prior colonic neoplasia and PH Colon Adenoma. MEDICATIONS: Propofol 350 mg IV and Monitored anesthesia care  DESCRIPTION OF PROCEDURE:   After the risks benefits and alternatives of the procedure were thoroughly explained, informed consent was obtained.  The digital rectal exam revealed no abnormalities of the rectum.   The LB [id number] [id number]  endoscope was introduced through the anus and advanced to the cecum, which was identified by both the appendix and ileocecal valve. No adverse events experienced.   The quality of the prep was adequate (MiraLax was used)  The instrument was then slowly withdrawn as the colon was fully examined. Estimated blood loss is zero unless otherwise noted in this procedure report.      COLON FINDINGS: A normal appearing cecum, ileocecal valve, and appendiceal orifice were identified.  The ascending, transverse, descending, sigmoid colon, and rectum appeared unremarkable.   The distal colon was bowund down with adhesions and moderately difficult to navigate - using variable adjustment pediatric colonoscope.  Retroflexed views revealed no abnormalities. The time to cecum = 10.3 Withdrawal time = 11.5   The scope was withdrawn and the procedure  completed. COMPLICATIONS: There were no immediate complications.  ENDOSCOPIC IMPRESSION: 1.   Normal colonoscopy 2.   The distal colon was bowund down with adhesions and moderately difficult to navigate - using variable adjustment pediatric colonoscope  RECOMMENDATIONS: Would not do routine repeat colonoscopy given difficulty and that only had 2 diminutive polyps in 2004 and next routine interval will be 79.  Consider stool DNA test then.  eSigned:  Gatha Mayer, MD, [facility] 01/19/2015 3:18 PM   cc: The Patient

## 2015-01-19 NOTE — Patient Instructions (Addendum)
No polyps!  No more routine colonoscopy is recommended.  Consider stool testing in 10 years.  I appreciate the opportunity to care for you. Gatha Mayer, MD, [facility]  Discharge instructions given. Normal exam. Resume previous medications. YOU HAD AN ENDOSCOPIC PROCEDURE TODAY AT [facility]:   Refer to the procedure report that was given to you for any specific questions about what was found during the examination.  If the procedure report does not answer your questions, please call your gastroenterologist to clarify.  If you requested that your care partner not be given the details of your procedure findings, then the procedure report has been included in a sealed envelope for you to review at your convenience later.  YOU SHOULD EXPECT: Some feelings of bloating in the abdomen. Passage of more gas than usual.  Walking can help get rid of the air that was put into your GI tract during the procedure and reduce the bloating. If you had a lower endoscopy (such as a colonoscopy or flexible sigmoidoscopy) you may notice spotting of blood in your stool or on the toilet paper. If you underwent a bowel prep for your procedure, you may not have a normal bowel movement for a few days.  Please Note:  You might notice some irritation and congestion in your nose or some drainage.  This is from the oxygen used during your procedure.  There is no need for concern and it should clear up in a day or so.  SYMPTOMS TO REPORT IMMEDIATELY:   Following lower endoscopy (colonoscopy or flexible sigmoidoscopy):  Excessive amounts of blood in the stool  Significant tenderness or worsening of abdominal pains  Swelling of the abdomen that is new, acute  Fever of 100F or higher   For urgent or emergent issues, a gastroenterologist can be reached at any hour by calling [phone].   DIET: Your first meal following the procedure should be a small meal and then it is ok to progress to  your normal diet. Heavy or fried foods are harder to digest and may make you feel nauseous or bloated.  Likewise, meals heavy in dairy and vegetables can increase bloating.  Drink plenty of fluids but you should avoid alcoholic beverages for 24 hours.  ACTIVITY:  You should plan to take it easy for the rest of today and you should NOT DRIVE or use heavy machinery until tomorrow (because of the sedation medicines used during the test).    FOLLOW UP: Our staff will call the number listed on your records the next business day following your procedure to check on you and address any questions or concerns that you may have regarding the information given to you following your procedure. If we do not reach you, we will leave a message.  However, if you are feeling well and you are not experiencing any problems, there is no need to return our call.  We will assume that you have returned to your regular daily activities without incident.  If any biopsies were taken you will be contacted by phone or by letter within the next 1-3 weeks.  Please call us at [phone] if you have not heard about the biopsies in 3 weeks.    SIGNATURES/CONFIDENTIALITY: You and/or your care partner have signed paperwork which will be entered into your electronic medical record.  These signatures attest to the fact that that the information above on your After Visit Summary has been reviewed and is understood.  Full responsibility of the confidentiality of this discharge information lies with you and/or your care-partner.

## 2015-01-20 ENCOUNTER — Other Ambulatory Visit: Payer: Self-pay | Admitting: Family Medicine

## 2015-01-20 ENCOUNTER — Telehealth: Payer: Self-pay | Admitting: *Deleted

## 2015-01-20 NOTE — Telephone Encounter (Signed)
  Follow up Call-  Call back number 01/19/2015  Post procedure Call Back phone  # [phone]  Permission to leave phone message Yes     Patient questions:  Do you have a fever, pain , or abdominal swelling? No. Pain Score  0 *  Have you tolerated food without any problems? Yes.    Have you been able to return to your normal activities? Yes.    Do you have any questions about your discharge instructions: Diet   No. Medications  No. Follow up visit  No.  Do you have questions or concerns about your Care? No.  Actions: * If pain score is 4 or above: No action needed, pain <4.

## 2015-01-21 NOTE — Telephone Encounter (Signed)
Medication filled to pharmacy as requested.   

## 2015-01-27 ENCOUNTER — Ambulatory Visit: Payer: Medicare Other | Admitting: Family Medicine

## 2015-01-28 ENCOUNTER — Ambulatory Visit (INDEPENDENT_AMBULATORY_CARE_PROVIDER_SITE_OTHER): Payer: Medicare Other | Admitting: Family Medicine

## 2015-01-28 ENCOUNTER — Encounter: Payer: Self-pay | Admitting: Family Medicine

## 2015-01-28 ENCOUNTER — Ambulatory Visit: Payer: Medicare Other | Admitting: Family Medicine

## 2015-01-28 VITALS — BP 140/78 | HR 70 | Temp 98.0°F | Ht 68.0 in | Wt 212.4 lb

## 2015-01-28 DIAGNOSIS — E1151 Type 2 diabetes mellitus with diabetic peripheral angiopathy without gangrene: Secondary | ICD-10-CM | POA: Diagnosis not present

## 2015-01-28 DIAGNOSIS — E119 Type 2 diabetes mellitus without complications: Secondary | ICD-10-CM | POA: Diagnosis not present

## 2015-01-28 DIAGNOSIS — Z1159 Encounter for screening for other viral diseases: Secondary | ICD-10-CM | POA: Diagnosis not present

## 2015-01-28 DIAGNOSIS — I10 Essential (primary) hypertension: Secondary | ICD-10-CM

## 2015-01-28 DIAGNOSIS — E785 Hyperlipidemia, unspecified: Secondary | ICD-10-CM | POA: Diagnosis not present

## 2015-01-28 DIAGNOSIS — H40033 Anatomical narrow angle, bilateral: Secondary | ICD-10-CM | POA: Diagnosis not present

## 2015-01-28 LAB — COMPREHENSIVE METABOLIC PANEL
ALBUMIN: 4.6 g/dL (ref 3.5–5.2)
ALK PHOS: 46 U/L (ref 39–117)
ALT: 27 U/L (ref 0–35)
AST: 27 U/L (ref 0–37)
BUN: 21 mg/dL (ref 6–23)
CALCIUM: 9.8 mg/dL (ref 8.4–10.5)
CHLORIDE: 97 meq/L (ref 96–112)
CO2: 27 mEq/L (ref 19–32)
CREATININE: 0.67 mg/dL (ref 0.40–1.20)
GFR: 92.62 mL/min (ref >60.00)
Glucose, Bld: 121 mg/dL — ABNORMAL HIGH (ref 70–99)
Potassium: 3.9 mEq/L (ref 3.5–5.1)
SODIUM: 137 meq/L (ref 135–145)
TOTAL PROTEIN: 7.3 g/dL (ref 6.0–8.3)
Total Bilirubin: 0.4 mg/dL (ref 0.2–1.2)

## 2015-01-28 LAB — LIPID PANEL
CHOL/HDL RATIO: 4
CHOLESTEROL: 204 mg/dL — AB (ref 0–200)
HDL: 46.7 mg/dL (ref >39.00)
NonHDL: 157.26
Triglycerides: 248 mg/dL — ABNORMAL HIGH (ref 0.0–149.0)
VLDL: 49.6 mg/dL — AB (ref 0.0–40.0)

## 2015-01-28 LAB — POCT URINALYSIS DIPSTICK
Bilirubin, UA: NEGATIVE
GLUCOSE UA: NEGATIVE
Ketones, UA: NEGATIVE
Leukocytes, UA: NEGATIVE
NITRITE UA: NEGATIVE
PROTEIN UA: NEGATIVE
RBC UA: NEGATIVE
SPEC GRAV UA: 1.015
UROBILINOGEN UA: 0.2
pH, UA: 7.5

## 2015-01-28 LAB — HEMOGLOBIN A1C: HEMOGLOBIN A1C: 8 % — AB (ref 4.6–6.5)

## 2015-01-28 LAB — HEPATITIS C ANTIBODY: HCV AB: NEGATIVE

## 2015-01-28 LAB — LDL CHOLESTEROL, DIRECT: LDL DIRECT: 130 mg/dL

## 2015-01-28 MED ORDER — GABAPENTIN 300 MG PO CAPS: 300.0000 mg | ORAL_CAPSULE | Freq: Three times a day (TID) | ORAL | Status: DC

## 2015-01-28 MED ORDER — SIMVASTATIN 40 MG PO TABS: 40.0000 mg | ORAL_TABLET | Freq: Every day | ORAL | Status: DC

## 2015-01-28 MED ORDER — HYDROCHLOROTHIAZIDE 25 MG PO TABS: 25.0000 mg | ORAL_TABLET | Freq: Every day | ORAL | Status: DC

## 2015-01-28 MED ORDER — FENOFIBRATE MICRONIZED 130 MG PO CAPS: 130.0000 mg | ORAL_CAPSULE | Freq: Every day | ORAL | Status: DC

## 2015-01-28 MED ORDER — POTASSIUM CHLORIDE CRYS ER 20 MEQ PO TBCR: 20.0000 meq | EXTENDED_RELEASE_TABLET | Freq: Every day | ORAL | Status: DC

## 2015-01-28 MED ORDER — LINAGLIPTIN-METFORMIN HCL 2.5-1000 MG PO TABS: 1.0000 | ORAL_TABLET | Freq: Two times a day (BID) | ORAL | Status: DC

## 2015-01-28 MED ORDER — GLIPIZIDE 10 MG PO TABS: 20.0000 mg | ORAL_TABLET | Freq: Two times a day (BID) | ORAL | Status: DC

## 2015-01-28 MED ORDER — LISINOPRIL 5 MG PO TABS: 5.0000 mg | ORAL_TABLET | Freq: Every day | ORAL | Status: DC

## 2015-01-28 NOTE — Progress Notes (Signed)
Pre visit review using our clinic review tool, if applicable. No additional management support is needed unless otherwise documented below in the visit note. 

## 2015-01-28 NOTE — Patient Instructions (Signed)

## 2015-01-28 NOTE — Progress Notes (Signed)
Patient ID: Sandra Ray, female    DOB: 1945/11/15  Age: 69 y.o. MRN: [id number]    Subjective:  Subjective HPI Sandra Ray presents for f/u htn , dm and cholesterol    HYPERTENSION  Blood pressure range---not checking  Chest pain- no      Dyspnea- no Lightheadedness- no   Edema- no Other side effects - no   Medication compliance: good Low salt diet- yes  DIABETES  Blood Sugar ranges-not checking   Polyuria- no New Visual problems- no Hypoglycemic symptoms- no Other side effects-no Medication compliance - good Last eye exam- due Foot exam- today  HYPERLIPIDEMIA  Medication compliance- good RUQ pain- no  Muscle aches- no Other side effects-no  Review of Systems  Constitutional: Negative for diaphoresis, appetite change, fatigue and unexpected weight change.  Eyes: Negative for pain, redness and visual disturbance.  Respiratory: Negative for cough, chest tightness, shortness of breath and wheezing.   Cardiovascular: Negative for chest pain, palpitations and leg swelling.  Endocrine: Negative for cold intolerance, heat intolerance, polydipsia, polyphagia and polyuria.  Genitourinary: Negative for dysuria, frequency and difficulty urinating.  Neurological: Negative for dizziness, light-headedness, numbness and headaches.     History Past Medical History  Diagnosis Date  . Diabetes mellitus type II   . Hyperlipidemia   . Hypertension   . Degenerative disc disease   . Breast abscess     right breast  . Tubular adenoma of colon   . Carpal tunnel syndrome   . Eczema of hand   . Shingles   . Metatarsal deformity   . Arthritis   . Hx of colonic polyps 12/02/2014    2004 - 2 diminutive polyps     She has past surgical history that includes Lumbar laminectomy; Cesarean section; Tubal ligation; Abdominal hysterectomy (1981); carpel tunnel (Right); Abscess drainage (Right); Appendectomy (1966); and Colonoscopy.   Her family history includes Breast cancer in her  daughter, maternal aunt, and sister; Colon polyps in her brother; Diabetes in her brother and mother; Heart disease in her mother; High Cholesterol in her mother; Hypertension in her father and mother; Stroke in her mother.She reports that she has never smoked. She has never used smokeless tobacco. She reports that she does not drink alcohol or use illicit drugs.  Current Outpatient Prescriptions on File Prior to Visit  Medication Sig Dispense Refill  . acetaminophen (TYLENOL) 650 MG CR tablet Take 1,300 mg by mouth daily.     Marland Kitchen albuterol (VENTOLIN HFA) 108 (90 BASE) MCG/ACT inhaler Inhale 2 puffs into the lungs every 6 (six) hours as needed.      . Ascorbic Acid (VITAMIN C) 500 MG tablet Take 500 mg by mouth daily.      Marland Kitchen aspirin 81 MG tablet Take 81 mg by mouth daily.      . Cholecalciferol (VITAMIN D-3) 5000 UNITS TABS Take by mouth daily.     . Coenzyme Q10 100 MG TABS Take 400 mg by mouth daily.    . Garlic 7948 MG CAPS Take 3 capsules by mouth daily.    . promethazine (PHENERGAN) 25 MG tablet Take 1 tablet (25 mg total) by mouth 4 (four) times daily as needed. nausea 90 tablet 1  . vitamin E 400 UNIT capsule Take 400 Units by mouth daily.       No current facility-administered medications on file prior to visit.     Objective:  Objective Physical Exam  Constitutional: She is oriented to person, place, and time. She  appears well-developed and well-nourished.  HENT:  Head: Normocephalic and atraumatic.  Eyes: Conjunctivae and EOM are normal.  Neck: Normal range of motion. Neck supple. No JVD present. Carotid bruit is not present. No thyromegaly present.  Cardiovascular: Normal rate, regular rhythm and normal heart sounds.   No murmur heard. Pulmonary/Chest: Effort normal and breath sounds normal. No respiratory distress. She has no wheezes. She has no rales. She exhibits no tenderness.  Musculoskeletal: She exhibits no edema.  Neurological: She is alert and oriented to person, place,  and time.  Psychiatric: She has a normal mood and affect. Her behavior is normal.   BP 140/78 mmHg  Pulse 70  Temp(Src) 98 F (36.7 C) (Oral)  Ht _0  (1.727 m)  Wt 212 lb 6.4 oz (96.344 kg)  BMI 32.30 kg/m2  SpO2 95% Wt Readings from Last 3 Encounters:  01/28/15 212 lb 6.4 oz (96.344 kg)  01/19/15 210 lb (95.255 kg)  12/01/14 210 lb 2 oz (95.312 kg)     Lab Results  Component Value Date   WBC 8.5 10/22/2014   HGB 13.1 10/22/2014   HCT 39.5 10/22/2014   PLT 326.0 10/22/2014   GLUCOSE 112* 10/22/2014   CHOL 166 10/22/2014   TRIG 203.0* 10/22/2014   HDL 42.80 10/22/2014   LDLDIRECT 93.0 10/22/2014   LDLCALC 81 05/22/2014   ALT 22 10/22/2014   AST 26 10/22/2014   NA 138 10/22/2014   K 4.0 10/22/2014   CL 101 10/22/2014   CREATININE 0.74 10/22/2014   BUN 21 10/22/2014   CO2 25 10/22/2014   TSH 0.59 10/22/2014   INR 1.19 10/28/2009   HGBA1C 7.6* 10/22/2014   MICROALBUR 0.7 03/28/2010    Mm Screening Breast Tomo Bilateral  10/20/2014  CLINICAL DATA:  Screening. EXAM: DIGITAL SCREENING BILATERAL MAMMOGRAM WITH 3D TOMO WITH CAD COMPARISON:  Previous exam(s). ACR Breast Density Category b: There are scattered areas of fibroglandular density. FINDINGS: There are no findings suspicious for malignancy. Images were processed with CAD. IMPRESSION: No mammographic evidence of malignancy. A result letter of this screening mammogram will be mailed directly to the patient. RECOMMENDATION: Screening mammogram in one year. (Code:SM-B-01Y) BI-RADS CATEGORY  1: Negative. Electronically Signed   By: Altamese Cabal M.D.   On: 10/20/2014 15:56     Assessment & Plan:  Plan I have changed Sandra Ray's JENTADUETO to Linagliptin-Metformin HCl and KLOR-CON M20 to potassium chloride SA. I have also changed her fenofibrate micronized, gabapentin, lisinopril, and simvastatin. I am also having her maintain her aspirin, acetaminophen, albuterol, vitamin C, vitamin E, Vitamin D-3, Garlic,  promethazine, Coenzyme Q10, glipiZIDE, and hydrochlorothiazide.  Meds ordered this encounter  Medications  . fenofibrate micronized (ANTARA) 130 MG capsule    Sig: Take 1 capsule (130 mg total) by mouth daily.    Dispense:  90 capsule    Refill:  1  . gabapentin (NEURONTIN) 300 MG capsule    Sig: Take 1 capsule (300 mg total) by mouth 3 (three) times daily.    Dispense:  90 capsule    Refill:  6  . glipiZIDE (GLUCOTROL) 10 MG tablet    Sig: Take 2 tablets (20 mg total) by mouth 2 (two) times daily before a meal.    Dispense:  120 tablet    Refill:  6  . hydrochlorothiazide (HYDRODIURIL) 25 MG tablet    Sig: Take 1 tablet (25 mg total) by mouth daily.    Dispense:  90 tablet    Refill:  0  Diabetic Follow up needed for refills.  . Linagliptin-Metformin HCl (JENTADUETO) 2.07-998 MG TABS    Sig: Take 1 tablet by mouth 2 (two) times daily.    Dispense:  180 tablet    Refill:  1  . potassium chloride SA (KLOR-CON M20) 20 MEQ tablet    Sig: Take 1 tablet (20 mEq total) by mouth daily.    Dispense:  90 tablet    Refill:  1  . lisinopril (PRINIVIL,ZESTRIL) 5 MG tablet    Sig: Take 1 tablet (5 mg total) by mouth daily.    Dispense:  90 tablet    Refill:  1  . simvastatin (ZOCOR) 40 MG tablet    Sig: Take 1 tablet (40 mg total) by mouth at bedtime.    Dispense:  90 tablet    Refill:  1    Problem List Items Addressed This Visit    Essential hypertension   Relevant Medications   fenofibrate micronized (ANTARA) 130 MG capsule   hydrochlorothiazide (HYDRODIURIL) 25 MG tablet   lisinopril (PRINIVIL,ZESTRIL) 5 MG tablet   simvastatin (ZOCOR) 40 MG tablet   Other Relevant Orders   POCT urinalysis dipstick    Other Visit Diagnoses    Type II diabetes mellitus with peripheral artery disease (HCC)    -  Primary    Relevant Medications    fenofibrate micronized (ANTARA) 130 MG capsule    gabapentin (NEURONTIN) 300 MG capsule    glipiZIDE (GLUCOTROL) 10 MG tablet     hydrochlorothiazide (HYDRODIURIL) 25 MG tablet    Linagliptin-Metformin HCl (JENTADUETO) 2.07-998 MG TABS    lisinopril (PRINIVIL,ZESTRIL) 5 MG tablet    simvastatin (ZOCOR) 40 MG tablet    Other Relevant Orders    Comp Met (CMET)    Hemoglobin A1c    POCT urinalysis dipstick    Hyperlipidemia LDL goal <70        Relevant Medications    fenofibrate micronized (ANTARA) 130 MG capsule    hydrochlorothiazide (HYDRODIURIL) 25 MG tablet    potassium chloride SA (KLOR-CON M20) 20 MEQ tablet    lisinopril (PRINIVIL,ZESTRIL) 5 MG tablet    simvastatin (ZOCOR) 40 MG tablet    Other Relevant Orders    Comp Met (CMET)    Lipid panel    POCT urinalysis dipstick    Need for hepatitis C screening test        Relevant Orders    Hepatitis C antibody    Essential hypertension, benign        Relevant Medications    fenofibrate micronized (ANTARA) 130 MG capsule    hydrochlorothiazide (HYDRODIURIL) 25 MG tablet    lisinopril (PRINIVIL,ZESTRIL) 5 MG tablet    simvastatin (ZOCOR) 40 MG tablet       Follow-up: Return in about 6 months (around 07/28/2015), or if symptoms worsen or fail to improve, for hypertension, hyperlipidemia, diabetes II, annual exam, fasting.  Garnet Koyanagi, DO       .

## 2015-01-29 ENCOUNTER — Other Ambulatory Visit: Payer: Self-pay | Admitting: Family Medicine

## 2015-03-19 ENCOUNTER — Other Ambulatory Visit: Payer: Self-pay | Admitting: Family Medicine

## 2015-03-19 DIAGNOSIS — I10 Essential (primary) hypertension: Secondary | ICD-10-CM

## 2015-03-19 DIAGNOSIS — E785 Hyperlipidemia, unspecified: Secondary | ICD-10-CM

## 2015-03-22 MED ORDER — PRAVASTATIN SODIUM 20 MG PO TABS: 20.0000 mg | ORAL_TABLET | Freq: Every day | ORAL | Status: DC

## 2015-03-22 MED ORDER — LISINOPRIL 5 MG PO TABS: 5.0000 mg | ORAL_TABLET | Freq: Every day | ORAL | Status: DC

## 2015-03-22 MED ORDER — DAPAGLIFLOZIN PROPANEDIOL 5 MG PO TABS: 5.0000 mg | ORAL_TABLET | Freq: Every day | ORAL | Status: DC

## 2015-03-22 NOTE — Telephone Encounter (Signed)
My desk top was handled by someone else and the patient was supposed to start [place] and Pravastatin but there were never called in. Repeat labs are due in Feb, please advise if you want to call in med's or if the patient should wait until after she has labs done in Feb?     [place]

## 2015-03-22 NOTE — Telephone Encounter (Signed)
discussed with the patient and she verbalized understanding, she was not aware she had to start a new medication. All med's sent, she declined DM education material st this time, she will call back with any questions or concerns.     kp

## 2015-03-22 NOTE — Addendum Note (Signed)
Addended by: Ewing Schlein on: 03/22/2015 03:52 PM   Modules accepted: Orders

## 2015-03-22 NOTE — Telephone Encounter (Signed)
Call meds in now and repeat labs in 8 weeks

## 2015-04-19 ENCOUNTER — Other Ambulatory Visit: Payer: Self-pay | Admitting: Family Medicine

## 2015-04-19 NOTE — Telephone Encounter (Signed)
Medication filled to pharmacy as requested.   

## 2015-04-27 ENCOUNTER — Other Ambulatory Visit: Payer: Self-pay | Admitting: Family Medicine

## 2015-05-12 ENCOUNTER — Other Ambulatory Visit: Payer: Self-pay | Admitting: Family Medicine

## 2015-05-18 ENCOUNTER — Encounter: Payer: Self-pay | Admitting: Family Medicine

## 2015-05-18 ENCOUNTER — Ambulatory Visit (INDEPENDENT_AMBULATORY_CARE_PROVIDER_SITE_OTHER): Payer: Medicare Other | Admitting: Family Medicine

## 2015-05-18 VITALS — BP 138/74 | HR 74 | Temp 97.5°F | Ht 68.0 in | Wt 202.8 lb

## 2015-05-18 DIAGNOSIS — R82998 Other abnormal findings in urine: Secondary | ICD-10-CM

## 2015-05-18 DIAGNOSIS — R8299 Other abnormal findings in urine: Secondary | ICD-10-CM

## 2015-05-18 DIAGNOSIS — I10 Essential (primary) hypertension: Secondary | ICD-10-CM | POA: Diagnosis not present

## 2015-05-18 DIAGNOSIS — IMO0002 Reserved for concepts with insufficient information to code with codable children: Secondary | ICD-10-CM

## 2015-05-18 DIAGNOSIS — E1165 Type 2 diabetes mellitus with hyperglycemia: Secondary | ICD-10-CM

## 2015-05-18 DIAGNOSIS — E1151 Type 2 diabetes mellitus with diabetic peripheral angiopathy without gangrene: Secondary | ICD-10-CM | POA: Diagnosis not present

## 2015-05-18 DIAGNOSIS — E785 Hyperlipidemia, unspecified: Secondary | ICD-10-CM

## 2015-05-18 LAB — CBC WITH DIFFERENTIAL/PLATELET
BASOS PCT: 0.7 % (ref 0.0–3.0)
Basophils Absolute: 0.1 10*3/uL (ref 0.0–0.1)
EOS PCT: 6.2 % — AB (ref 0.0–5.0)
Eosinophils Absolute: 0.5 10*3/uL (ref 0.0–0.7)
HEMATOCRIT: 42.3 % (ref 36.0–46.0)
HEMOGLOBIN: 14.2 g/dL (ref 12.0–15.0)
LYMPHS PCT: 32.6 % (ref 12.0–46.0)
Lymphs Abs: 2.8 10*3/uL (ref 0.7–4.0)
MCHC: 33.5 g/dL (ref 30.0–36.0)
MCV: 87 fl (ref 78.0–100.0)
MONOS PCT: 9.6 % (ref 3.0–12.0)
Monocytes Absolute: 0.8 10*3/uL (ref 0.1–1.0)
Neutro Abs: 4.3 10*3/uL (ref 1.4–7.7)
Neutrophils Relative %: 50.9 % (ref 43.0–77.0)
Platelets: 294 10*3/uL (ref 150.0–400.0)
RBC: 4.86 Mil/uL (ref 3.87–5.11)
RDW: 14.8 % (ref 11.5–15.5)
WBC: 8.5 10*3/uL (ref 4.0–10.5)

## 2015-05-18 LAB — COMPREHENSIVE METABOLIC PANEL
ALBUMIN: 4.7 g/dL (ref 3.5–5.2)
ALK PHOS: 41 U/L (ref 39–117)
ALT: 19 U/L (ref 0–35)
AST: 21 U/L (ref 0–37)
BUN: 16 mg/dL (ref 6–23)
CALCIUM: 10.2 mg/dL (ref 8.4–10.5)
CHLORIDE: 102 meq/L (ref 96–112)
CO2: 21 mEq/L (ref 19–32)
Creatinine, Ser: 0.68 mg/dL (ref 0.40–1.20)
GFR: 90.97 mL/min (ref >60.00)
Glucose, Bld: 142 mg/dL — ABNORMAL HIGH (ref 70–99)
POTASSIUM: 4.2 meq/L (ref 3.5–5.1)
SODIUM: 138 meq/L (ref 135–145)
TOTAL PROTEIN: 7.4 g/dL (ref 6.0–8.3)
Total Bilirubin: 0.4 mg/dL (ref 0.2–1.2)

## 2015-05-18 LAB — POCT URINALYSIS DIPSTICK
Bilirubin, UA: NEGATIVE
KETONES UA: NEGATIVE
Nitrite, UA: NEGATIVE
PH UA: 6
PROTEIN UA: NEGATIVE
RBC UA: NEGATIVE
SPEC GRAV UA: 1.02
Urobilinogen, UA: 0.2

## 2015-05-18 LAB — HEMOGLOBIN A1C: Hgb A1c MFr Bld: 7.5 % — ABNORMAL HIGH (ref 4.6–6.5)

## 2015-05-18 LAB — LIPID PANEL
Cholesterol: 189 mg/dL (ref 0–200)
HDL: 49.1 mg/dL (ref >39.00)
NONHDL: 139.73
Total CHOL/HDL Ratio: 4
Triglycerides: 222 mg/dL — ABNORMAL HIGH (ref 0.0–149.0)
VLDL: 44.4 mg/dL — ABNORMAL HIGH (ref 0.0–40.0)

## 2015-05-18 LAB — LDL CHOLESTEROL, DIRECT: Direct LDL: 108 mg/dL

## 2015-05-18 MED ORDER — LINAGLIPTIN-METFORMIN HCL 2.5-1000 MG PO TABS: 1.0000 | ORAL_TABLET | Freq: Two times a day (BID) | ORAL | Status: DC

## 2015-05-18 MED ORDER — GLIPIZIDE 10 MG PO TABS: 20.0000 mg | ORAL_TABLET | Freq: Two times a day (BID) | ORAL | Status: DC

## 2015-05-18 MED ORDER — LISINOPRIL 5 MG PO TABS: 5.0000 mg | ORAL_TABLET | Freq: Every day | ORAL | Status: DC

## 2015-05-18 MED ORDER — SIMVASTATIN 40 MG PO TABS: 40.0000 mg | ORAL_TABLET | Freq: Every day | ORAL | Status: DC

## 2015-05-18 MED ORDER — DAPAGLIFLOZIN PROPANEDIOL 5 MG PO TABS: 5.0000 mg | ORAL_TABLET | Freq: Every day | ORAL | Status: DC

## 2015-05-18 MED ORDER — PRAVASTATIN SODIUM 20 MG PO TABS: 20.0000 mg | ORAL_TABLET | Freq: Every day | ORAL | Status: DC

## 2015-05-18 MED ORDER — FENOFIBRATE MICRONIZED 130 MG PO CAPS: 130.0000 mg | ORAL_CAPSULE | Freq: Every day | ORAL | Status: DC

## 2015-05-18 NOTE — Progress Notes (Signed)
Pre visit review using our clinic review tool, if applicable. No additional management support is needed unless otherwise documented below in the visit note. 

## 2015-05-18 NOTE — Patient Instructions (Signed)

## 2015-05-18 NOTE — Progress Notes (Signed)
Patient ID: Sandra Ray, female    DOB: 04-Jan-1946  Age: 70 y.o. MRN: [id number]    Subjective:  Subjective HPI Sandra Ray presents for f/u dm , cholesterol and htn.  HYPERTENSION  Blood pressure range---  No checking   Chest pain- no      Dyspnea- no Lightheadedness- no   Edema- no Other side effects - no   Medication compliance: good Low salt diet- yes  DIABETES  Blood Sugar ranges-not checking   Polyuria- no New Visual problems- no Hypoglycemic symptoms- no Other side effects-noo Medication compliance - goos Last eye exam- 01/2015 Foot exam- today  HYPERLIPIDEMIA  Medication compliance- good RUQ pain- no  Muscle aches- no Other side effects-no  Review of Systems  Constitutional: Negative for diaphoresis, appetite change, fatigue and unexpected weight change.  Eyes: Negative for pain, redness and visual disturbance.  Respiratory: Negative for cough, chest tightness, shortness of breath and wheezing.   Cardiovascular: Negative for chest pain, palpitations and leg swelling.  Endocrine: Negative for cold intolerance, heat intolerance, polydipsia, polyphagia and polyuria.  Genitourinary: Negative for dysuria, frequency and difficulty urinating.  Neurological: Negative for dizziness, light-headedness, numbness and headaches.      History Past Medical History  Diagnosis Date  . Diabetes mellitus type II   . Hyperlipidemia   . Hypertension   . Degenerative disc disease   . Breast abscess     right breast  . Tubular adenoma of colon   . Carpal tunnel syndrome   . Eczema of hand   . Shingles   . Metatarsal deformity   . Arthritis   . Hx of colonic polyps 12/02/2014    2004 - 2 diminutive polyps     She has past surgical history that includes Lumbar laminectomy; Cesarean section; Tubal ligation; Abdominal hysterectomy (1981); carpel tunnel (Right); Abscess drainage (Right); Appendectomy (1966); and Colonoscopy.   Her family history includes Breast cancer in  her daughter, maternal aunt, and sister; Colon polyps in her brother; Diabetes in her brother and mother; Heart disease in her mother; High Cholesterol in her mother; Hypertension in her father and mother; Stroke in her mother.She reports that she has never smoked. She has never used smokeless tobacco. She reports that she does not drink alcohol or use illicit drugs.  Current Outpatient Prescriptions on File Prior to Visit  Medication Sig Dispense Refill  . acetaminophen (TYLENOL) 650 MG CR tablet Take 1,300 mg by mouth daily.     Marland Kitchen albuterol (VENTOLIN HFA) 108 (90 BASE) MCG/ACT inhaler Inhale 2 puffs into the lungs every 6 (six) hours as needed.      . Ascorbic Acid (VITAMIN C) 500 MG tablet Take 500 mg by mouth daily.      Marland Kitchen aspirin 81 MG tablet Take 81 mg by mouth daily.      . Cholecalciferol (VITAMIN D-3) 5000 UNITS TABS Take by mouth daily.     . Coenzyme Q10 100 MG TABS Take 400 mg by mouth daily.    Marland Kitchen gabapentin (NEURONTIN) 300 MG capsule Take 1 capsule (300 mg total) by mouth 3 (three) times daily. 90 capsule 6  . Garlic 1694 MG CAPS Take 1 capsule by mouth daily.     . hydrochlorothiazide (HYDRODIURIL) 25 MG tablet Take 1 tablet (25 mg total) by mouth daily. 90 tablet 0  . potassium chloride SA (KLOR-CON M20) 20 MEQ tablet Take 1 tablet (20 mEq total) by mouth daily. 90 tablet 1  . promethazine (PHENERGAN) 25 MG tablet  TAKE ONE TABLET BY MOUTH 4 TIMES DAILY AS NEEDED 90 tablet 0  . vitamin E 400 UNIT capsule Take 400 Units by mouth daily.       No current facility-administered medications on file prior to visit.     Objective:  Objective Physical Exam  Constitutional: She is oriented to person, place, and time. She appears well-developed and well-nourished.  HENT:  Head: Normocephalic and atraumatic.  Eyes: Conjunctivae and EOM are normal.  Neck: Normal range of motion. Neck supple. No JVD present. Carotid bruit is not present. No thyromegaly present.  Cardiovascular: Normal  rate, regular rhythm and normal heart sounds.   No murmur heard. Pulmonary/Chest: Effort normal and breath sounds normal. No respiratory distress. She has no wheezes. She has no rales. She exhibits no tenderness.  Musculoskeletal: She exhibits no edema.  Neurological: She is alert and oriented to person, place, and time.  Psychiatric: She has a normal mood and affect. Her behavior is normal. Judgment and thought content normal.  Nursing note and vitals reviewed. Sensory exam of the foot is normal, tested with the monofilament. Good pulses, no lesions or ulcers, good peripheral pulses. BP 138/74 mmHg  Pulse 74  Temp(Src) 97.5 F (36.4 C) (Oral)  Ht '5\' 8"'  (1.727 m)  Wt 202 lb 12.8 oz (91.989 kg)  BMI 30.84 kg/m2  SpO2 96% Wt Readings from Last 3 Encounters:  05/18/15 202 lb 12.8 oz (91.989 kg)  01/28/15 212 lb 6.4 oz (96.344 kg)  01/19/15 210 lb (95.255 kg)     Lab Results  Component Value Date   WBC 8.5 10/22/2014   HGB 13.1 10/22/2014   HCT 39.5 10/22/2014   PLT 326.0 10/22/2014   GLUCOSE 121* 01/28/2015   CHOL 204* 01/28/2015   TRIG 248.0* 01/28/2015   HDL 46.70 01/28/2015   LDLDIRECT 130.0 01/28/2015   LDLCALC 81 05/22/2014   ALT 27 01/28/2015   AST 27 01/28/2015   NA 137 01/28/2015   K 3.9 01/28/2015   CL 97 01/28/2015   CREATININE 0.67 01/28/2015   BUN 21 01/28/2015   CO2 27 01/28/2015   TSH 0.59 10/22/2014   INR 1.19 10/28/2009   HGBA1C 8.0* 01/28/2015   MICROALBUR 0.7 03/28/2010    Mm Screening Breast Tomo Bilateral  10/20/2014  CLINICAL DATA:  Screening. EXAM: DIGITAL SCREENING BILATERAL MAMMOGRAM WITH 3D TOMO WITH CAD COMPARISON:  Previous exam(s). ACR Breast Density Category b: There are scattered areas of fibroglandular density. FINDINGS: There are no findings suspicious for malignancy. Images were processed with CAD. IMPRESSION: No mammographic evidence of malignancy. A result letter of this screening mammogram will be mailed directly to the patient.  RECOMMENDATION: Screening mammogram in one year. (Code:SM-B-01Y) BI-RADS CATEGORY  1: Negative. Electronically Signed   By: Sandra Ray M.D.   On: 10/20/2014 15:56     Assessment & Plan:  Plan I have changed Sandra Ray's glipiZIDE. I am also having her maintain her aspirin, acetaminophen, albuterol, vitamin C, vitamin E, Vitamin D-3, Garlic, Coenzyme I01, gabapentin, hydrochlorothiazide, potassium chloride SA, promethazine, dapagliflozin propanediol, fenofibrate micronized, lisinopril, Linagliptin-Metformin HCl, pravastatin, and simvastatin.  Meds ordered this encounter  Medications  . dapagliflozin propanediol (FARXIGA) 5 MG TABS tablet    Sig: Take 5 mg by mouth daily.    Dispense:  30 tablet    Refill:  2  . fenofibrate micronized (ANTARA) 130 MG capsule    Sig: Take 1 capsule (130 mg total) by mouth daily.    Dispense:  90 capsule  Refill:  1  . lisinopril (PRINIVIL,ZESTRIL) 5 MG tablet    Sig: Take 1 tablet (5 mg total) by mouth daily.    Dispense:  90 tablet    Refill:  1  . Linagliptin-Metformin HCl (JENTADUETO) 2.07-998 MG TABS    Sig: Take 1 tablet by mouth 2 (two) times daily.    Dispense:  180 tablet    Refill:  1  . pravastatin (PRAVACHOL) 20 MG tablet    Sig: Take 1 tablet (20 mg total) by mouth daily.    Dispense:  90 tablet    Refill:  0  . simvastatin (ZOCOR) 40 MG tablet    Sig: Take 1 tablet (40 mg total) by mouth at bedtime. Repeat labs are due now    Dispense:  90 tablet    Refill:  0  . glipiZIDE (GLUCOTROL) 10 MG tablet    Sig: Take 2 tablets (20 mg total) by mouth 2 (two) times daily.    Dispense:  120 tablet    Refill:  5    Problem List Items Addressed This Visit    Essential hypertension   Relevant Medications   fenofibrate micronized (ANTARA) 130 MG capsule   lisinopril (PRINIVIL,ZESTRIL) 5 MG tablet   pravastatin (PRAVACHOL) 20 MG tablet   simvastatin (ZOCOR) 40 MG tablet   Other Relevant Orders   Comp Met (CMET)   CBC with  Differential/Platelet   POCT urinalysis dipstick    Other Visit Diagnoses    DM (diabetes mellitus) type II uncontrolled, periph vascular disorder (HCC)    -  Primary    Relevant Medications    dapagliflozin propanediol (FARXIGA) 5 MG TABS tablet    fenofibrate micronized (ANTARA) 130 MG capsule    lisinopril (PRINIVIL,ZESTRIL) 5 MG tablet    Linagliptin-Metformin HCl (JENTADUETO) 2.07-998 MG TABS    pravastatin (PRAVACHOL) 20 MG tablet    simvastatin (ZOCOR) 40 MG tablet    glipiZIDE (GLUCOTROL) 10 MG tablet    Other Relevant Orders    Comp Met (CMET)    CBC with Differential/Platelet    Hemoglobin A1c    POCT urinalysis dipstick    Hyperlipidemia LDL goal <70        Relevant Medications    fenofibrate micronized (ANTARA) 130 MG capsule    lisinopril (PRINIVIL,ZESTRIL) 5 MG tablet    pravastatin (PRAVACHOL) 20 MG tablet    simvastatin (ZOCOR) 40 MG tablet    Other Relevant Orders    Comp Met (CMET)    CBC with Differential/Platelet    Lipid panel    POCT urinalysis dipstick    Type II diabetes mellitus with peripheral artery disease (HCC)        Relevant Medications    dapagliflozin propanediol (FARXIGA) 5 MG TABS tablet    fenofibrate micronized (ANTARA) 130 MG capsule    lisinopril (PRINIVIL,ZESTRIL) 5 MG tablet    Linagliptin-Metformin HCl (JENTADUETO) 2.07-998 MG TABS    pravastatin (PRAVACHOL) 20 MG tablet    simvastatin (ZOCOR) 40 MG tablet    glipiZIDE (GLUCOTROL) 10 MG tablet    Essential hypertension, benign        Relevant Medications    fenofibrate micronized (ANTARA) 130 MG capsule    lisinopril (PRINIVIL,ZESTRIL) 5 MG tablet    pravastatin (PRAVACHOL) 20 MG tablet    simvastatin (ZOCOR) 40 MG tablet       Follow-up: Return in about 6 months (around 11/18/2015), or if symptoms worsen or fail to improve, for hypertension, hyperlipidemia, diabetes II.  Garnet Koyanagi, DO

## 2015-05-19 ENCOUNTER — Telehealth: Payer: Self-pay | Admitting: *Deleted

## 2015-05-20 LAB — URINE CULTURE: Colony Count: 40000

## 2015-05-24 ENCOUNTER — Other Ambulatory Visit: Payer: Self-pay

## 2015-05-24 ENCOUNTER — Telehealth: Payer: Self-pay | Admitting: Family Medicine

## 2015-05-24 DIAGNOSIS — IMO0002 Reserved for concepts with insufficient information to code with codable children: Secondary | ICD-10-CM

## 2015-05-24 DIAGNOSIS — E1165 Type 2 diabetes mellitus with hyperglycemia: Principal | ICD-10-CM

## 2015-05-24 DIAGNOSIS — E1151 Type 2 diabetes mellitus with diabetic peripheral angiopathy without gangrene: Secondary | ICD-10-CM

## 2015-05-24 MED ORDER — DAPAGLIFLOZIN PROPANEDIOL 10 MG PO TABS: 10.0000 mg | ORAL_TABLET | Freq: Every day | ORAL | Status: DC

## 2015-05-24 NOTE — Telephone Encounter (Signed)
See labs.     KP 

## 2015-05-24 NOTE — Telephone Encounter (Signed)
Relation to [id number] Call back number:[phone]   Reason for call:  Patient would like to discuss medication and most recent labs.

## 2015-05-31 ENCOUNTER — Telehealth: Payer: Self-pay | Admitting: Family Medicine

## 2015-05-31 DIAGNOSIS — E1151 Type 2 diabetes mellitus with diabetic peripheral angiopathy without gangrene: Secondary | ICD-10-CM

## 2015-05-31 MED ORDER — LINAGLIPTIN-METFORMIN HCL 2.5-1000 MG PO TABS: 1.0000 | ORAL_TABLET | Freq: Two times a day (BID) | ORAL | Status: DC

## 2015-05-31 MED ORDER — DAPAGLIFLOZIN PROPANEDIOL 10 MG PO TABS: 10.0000 mg | ORAL_TABLET | Freq: Every day | ORAL | Status: DC

## 2015-05-31 NOTE — Telephone Encounter (Signed)
Caller name: Self  Can be reached: [phone] Pharmacy:   [facility] [address] ([address]), [place] [place] [address] [id number] (Phone) [phone] (Fax)        Reason for call: Refill Linagliptin-Metformin HCl (JENTADUETO) 2.07-998 MG TABS [id number]  And dapagliflozin propanediol (FARXIGA) 10 MG TABS tablet [id number] and

## 2015-05-31 NOTE — Telephone Encounter (Signed)
Faxed.   KP 

## 2015-06-10 ENCOUNTER — Telehealth: Payer: Self-pay | Admitting: Family Medicine

## 2015-06-10 DIAGNOSIS — E1151 Type 2 diabetes mellitus with diabetic peripheral angiopathy without gangrene: Secondary | ICD-10-CM

## 2015-06-10 MED ORDER — GABAPENTIN 300 MG PO CAPS: 300.0000 mg | ORAL_CAPSULE | Freq: Three times a day (TID) | ORAL | Status: DC

## 2015-06-10 NOTE — Telephone Encounter (Signed)
Rx faxed.    KP 

## 2015-06-10 NOTE — Telephone Encounter (Signed)
Refill request for gabapentin.    Pharmacy: [facility] [address] ([address]), [place] - [place]: [phone]

## 2015-06-14 IMAGING — CR DG CHEST 2V
2 series · 2 of 2 positions shown · non-contrast
Comparison: 06/29/2011

CLINICAL DATA: Left breast and axillary pain.

EXAM:
CHEST  2 VIEW

[w chest pa]
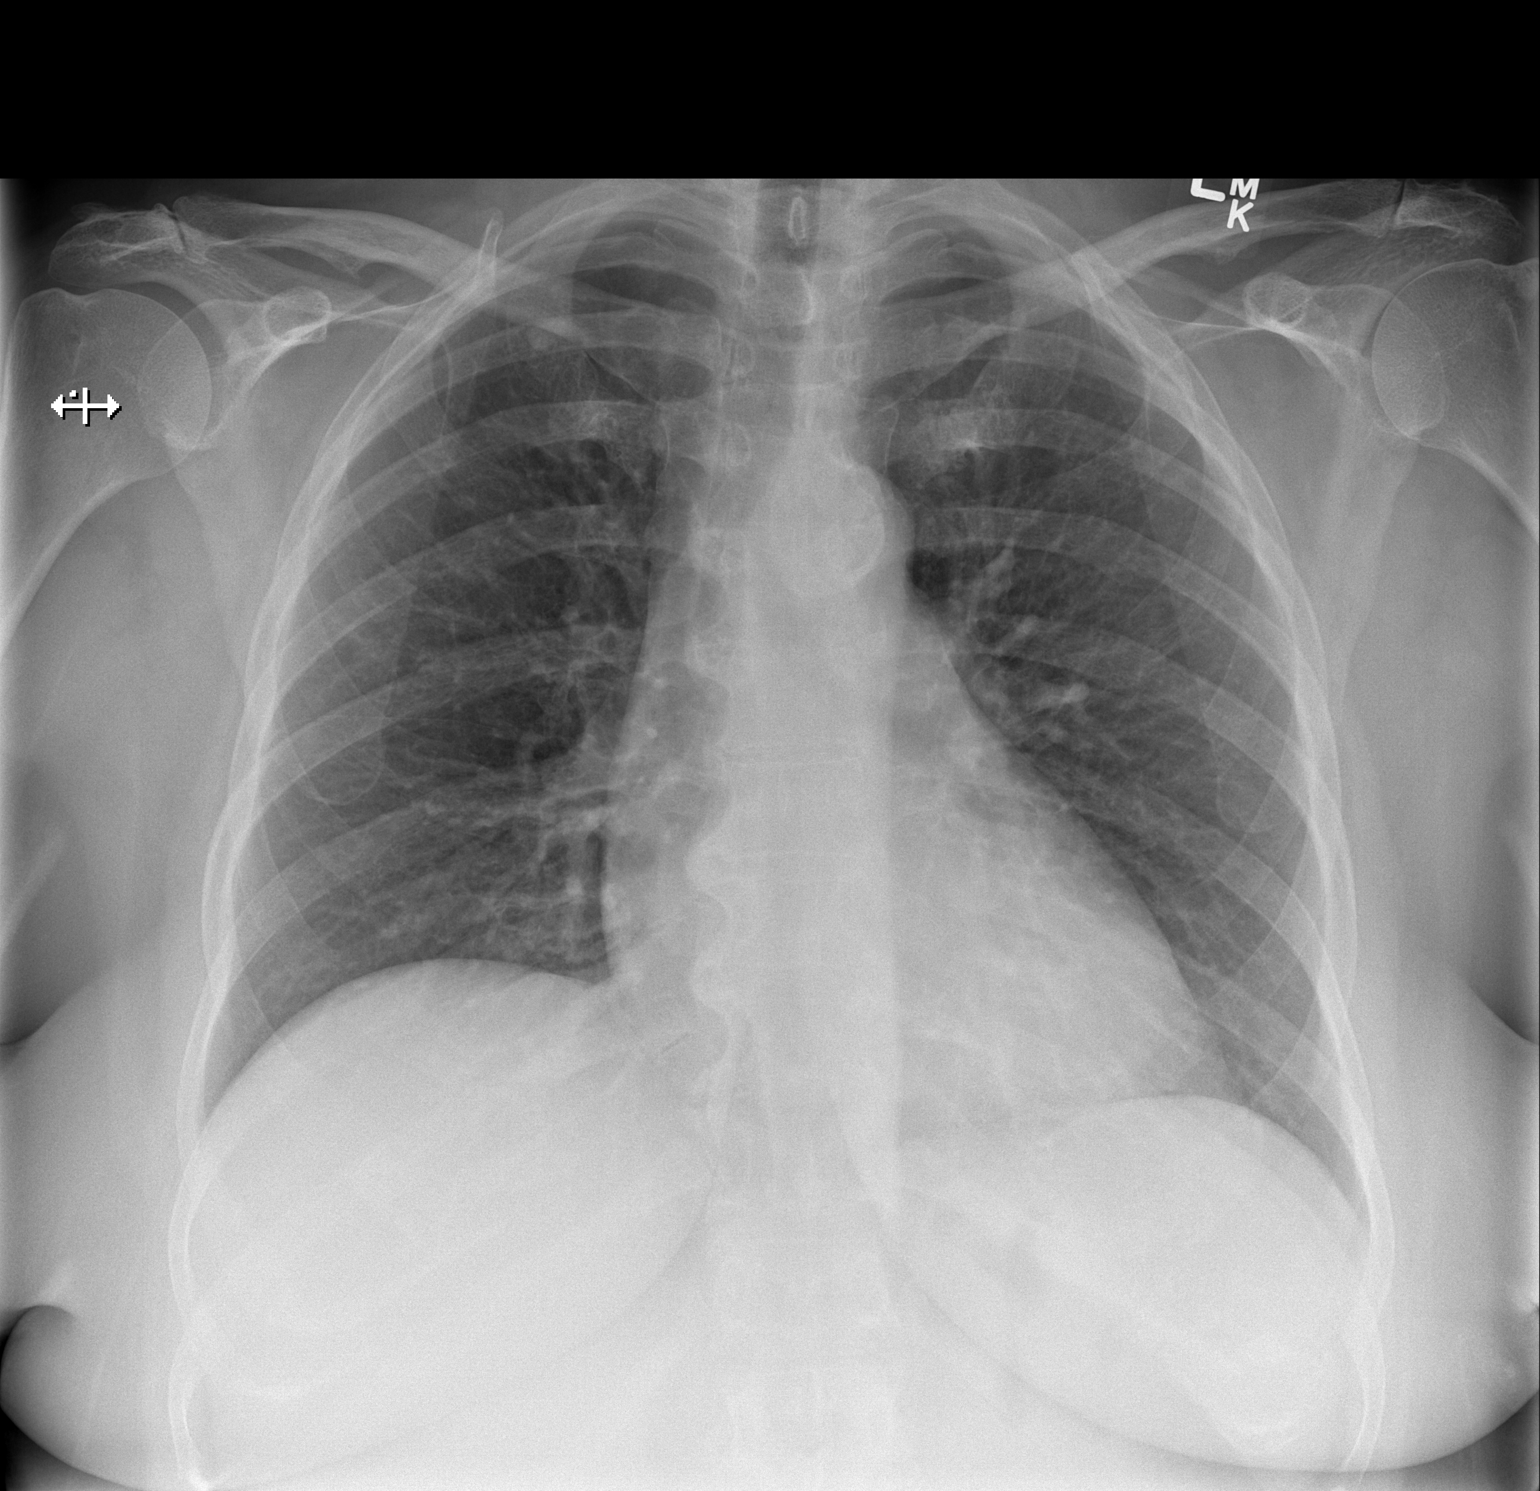

[w chest lat]
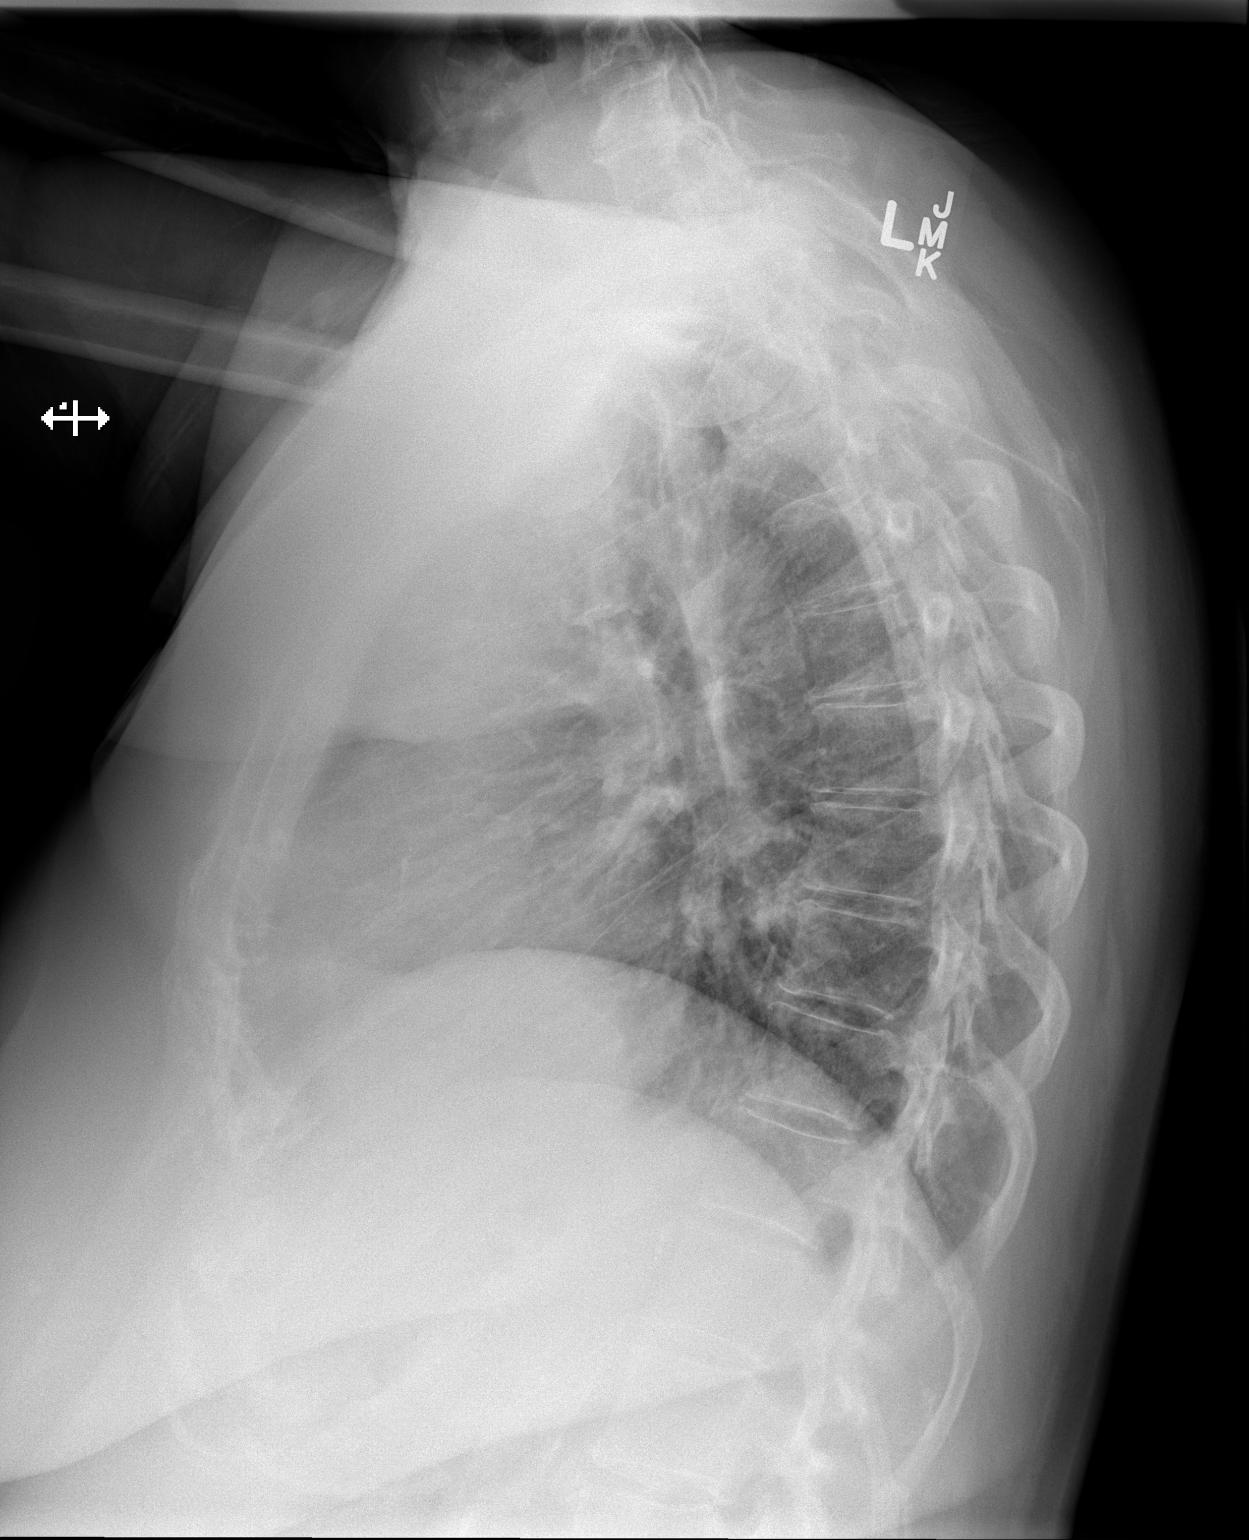

[2 of 2 positions shown; findings below may reference images not displayed]

FINDINGS: Heart size is normal. There is calcification of the thoracic aorta.
I think there may be patchy infiltrate at the left base. No dense
consolidation. No effusions. No significant bony findings. Bone
island of the distal first rib on the right again noted.
IMPRESSION: Suspicion of patchy infiltrate/pneumonia in the left lower lobe.

## 2015-06-29 ENCOUNTER — Other Ambulatory Visit: Payer: Self-pay | Admitting: Family Medicine

## 2015-07-06 ENCOUNTER — Telehealth: Payer: Self-pay | Admitting: Family Medicine

## 2015-07-06 NOTE — Telephone Encounter (Signed)
Please advise      KP 

## 2015-07-06 NOTE — Telephone Encounter (Signed)
Pt called in because she says that she is in her donut hole with her insurance. She says that medication Wilder Glade is to expensive. Pt would like to discuss other options further.    CB: [phone]

## 2015-07-06 NOTE — Telephone Encounter (Signed)
Can we get some sample for her and is there a pat assistance program?

## 2015-07-07 NOTE — Telephone Encounter (Signed)
I called and Sandra Ray will bring samples next week, I have faxed the patient a 30 day free coupon as well as faxed over the patient assistance form to her home address.    KP

## 2015-07-12 ENCOUNTER — Other Ambulatory Visit: Payer: Self-pay | Admitting: Family Medicine

## 2015-07-29 ENCOUNTER — Ambulatory Visit: Payer: Medicare Other | Admitting: Family Medicine

## 2015-08-16 ENCOUNTER — Telehealth: Payer: Self-pay | Admitting: Family Medicine

## 2015-08-16 DIAGNOSIS — E785 Hyperlipidemia, unspecified: Secondary | ICD-10-CM

## 2015-08-16 DIAGNOSIS — I10 Essential (primary) hypertension: Secondary | ICD-10-CM

## 2015-08-16 MED ORDER — HYDROCHLOROTHIAZIDE 25 MG PO TABS: 25.0000 mg | ORAL_TABLET | Freq: Every day | ORAL | Status: DC

## 2015-08-16 MED ORDER — SIMVASTATIN 40 MG PO TABS: 40.0000 mg | ORAL_TABLET | Freq: Every day | ORAL | Status: DC

## 2015-08-16 NOTE — Telephone Encounter (Signed)
Caller name: Self  Can be reached: [place]:  [facility] [address] ([address]), [place] - [place] [id number] (Phone) [phone] (Fax)         Reason for call: Refills on simvastatin (ZOCOR) 40 MG tablet and hydrochlorothiazide (HYDRODIURIL) 25 MG tablet [id number]

## 2015-08-16 NOTE — Telephone Encounter (Signed)
Rx faxed.    KP 

## 2015-08-28 DIAGNOSIS — M25511 Pain in right shoulder: Secondary | ICD-10-CM | POA: Diagnosis not present

## 2015-08-28 DIAGNOSIS — M65331 Trigger finger, right middle finger: Secondary | ICD-10-CM | POA: Diagnosis not present

## 2015-09-01 ENCOUNTER — Other Ambulatory Visit: Payer: Self-pay | Admitting: Orthopedic Surgery

## 2015-09-01 DIAGNOSIS — M25511 Pain in right shoulder: Secondary | ICD-10-CM

## 2015-09-07 DIAGNOSIS — Z23 Encounter for immunization: Secondary | ICD-10-CM | POA: Diagnosis not present

## 2015-09-10 ENCOUNTER — Ambulatory Visit
Admission: RE | Admit: 2015-09-10 | Discharge: 2015-09-10 | Disposition: A | Discharge status: Home / Self Care | Payer: Medicare Other | Source: Ambulatory Visit | Attending: Orthopedic Surgery | Admitting: Orthopedic Surgery

## 2015-09-10 DIAGNOSIS — S46011A Strain of muscle(s) and tendon(s) of the rotator cuff of right shoulder, initial encounter: Secondary | ICD-10-CM | POA: Diagnosis not present

## 2015-09-10 DIAGNOSIS — M25511 Pain in right shoulder: Secondary | ICD-10-CM

## 2015-09-15 DIAGNOSIS — M24811 Other specific joint derangements of right shoulder, not elsewhere classified: Secondary | ICD-10-CM | POA: Diagnosis not present

## 2015-09-15 DIAGNOSIS — M75121 Complete rotator cuff tear or rupture of right shoulder, not specified as traumatic: Secondary | ICD-10-CM | POA: Diagnosis not present

## 2015-09-21 DIAGNOSIS — S43431A Superior glenoid labrum lesion of right shoulder, initial encounter: Secondary | ICD-10-CM | POA: Diagnosis not present

## 2015-09-21 DIAGNOSIS — M7541 Impingement syndrome of right shoulder: Secondary | ICD-10-CM | POA: Diagnosis not present

## 2015-09-21 DIAGNOSIS — M19011 Primary osteoarthritis, right shoulder: Secondary | ICD-10-CM | POA: Diagnosis not present

## 2015-09-21 DIAGNOSIS — G8918 Other acute postprocedural pain: Secondary | ICD-10-CM | POA: Diagnosis not present

## 2015-09-21 DIAGNOSIS — M24111 Other articular cartilage disorders, right shoulder: Secondary | ICD-10-CM | POA: Diagnosis not present

## 2015-09-21 DIAGNOSIS — M75121 Complete rotator cuff tear or rupture of right shoulder, not specified as traumatic: Secondary | ICD-10-CM | POA: Diagnosis not present

## 2015-09-29 DIAGNOSIS — M24811 Other specific joint derangements of right shoulder, not elsewhere classified: Secondary | ICD-10-CM | POA: Diagnosis not present

## 2015-10-01 DIAGNOSIS — M25611 Stiffness of right shoulder, not elsewhere classified: Secondary | ICD-10-CM | POA: Diagnosis not present

## 2015-10-01 DIAGNOSIS — M75121 Complete rotator cuff tear or rupture of right shoulder, not specified as traumatic: Secondary | ICD-10-CM | POA: Diagnosis not present

## 2015-10-01 DIAGNOSIS — M25511 Pain in right shoulder: Secondary | ICD-10-CM | POA: Diagnosis not present

## 2015-10-05 DIAGNOSIS — M25611 Stiffness of right shoulder, not elsewhere classified: Secondary | ICD-10-CM | POA: Diagnosis not present

## 2015-10-05 DIAGNOSIS — M75121 Complete rotator cuff tear or rupture of right shoulder, not specified as traumatic: Secondary | ICD-10-CM | POA: Diagnosis not present

## 2015-10-05 DIAGNOSIS — M25511 Pain in right shoulder: Secondary | ICD-10-CM | POA: Diagnosis not present

## 2015-10-07 DIAGNOSIS — M25611 Stiffness of right shoulder, not elsewhere classified: Secondary | ICD-10-CM | POA: Diagnosis not present

## 2015-10-07 DIAGNOSIS — M25511 Pain in right shoulder: Secondary | ICD-10-CM | POA: Diagnosis not present

## 2015-10-07 DIAGNOSIS — M75121 Complete rotator cuff tear or rupture of right shoulder, not specified as traumatic: Secondary | ICD-10-CM | POA: Diagnosis not present

## 2015-10-11 DIAGNOSIS — M25611 Stiffness of right shoulder, not elsewhere classified: Secondary | ICD-10-CM | POA: Diagnosis not present

## 2015-10-11 DIAGNOSIS — M75121 Complete rotator cuff tear or rupture of right shoulder, not specified as traumatic: Secondary | ICD-10-CM | POA: Diagnosis not present

## 2015-10-11 DIAGNOSIS — M25511 Pain in right shoulder: Secondary | ICD-10-CM | POA: Diagnosis not present

## 2015-10-12 ENCOUNTER — Other Ambulatory Visit: Payer: Self-pay

## 2015-10-12 DIAGNOSIS — E1151 Type 2 diabetes mellitus with diabetic peripheral angiopathy without gangrene: Secondary | ICD-10-CM

## 2015-10-12 NOTE — Telephone Encounter (Signed)
Patient called to request samples of Farxiga 10 mg can be called at [phone]

## 2015-10-12 NOTE — Telephone Encounter (Signed)
We do not sample [place].     KP

## 2015-10-13 DIAGNOSIS — M25611 Stiffness of right shoulder, not elsewhere classified: Secondary | ICD-10-CM | POA: Diagnosis not present

## 2015-10-13 DIAGNOSIS — M25511 Pain in right shoulder: Secondary | ICD-10-CM | POA: Diagnosis not present

## 2015-10-13 DIAGNOSIS — M75121 Complete rotator cuff tear or rupture of right shoulder, not specified as traumatic: Secondary | ICD-10-CM | POA: Diagnosis not present

## 2015-10-20 DIAGNOSIS — M75121 Complete rotator cuff tear or rupture of right shoulder, not specified as traumatic: Secondary | ICD-10-CM | POA: Diagnosis not present

## 2015-10-20 DIAGNOSIS — M25511 Pain in right shoulder: Secondary | ICD-10-CM | POA: Diagnosis not present

## 2015-10-21 NOTE — Telephone Encounter (Signed)
Please advise      KP 

## 2015-10-21 NOTE — Telephone Encounter (Signed)
We would need formulary

## 2015-10-21 NOTE — Telephone Encounter (Signed)
Caller name: Relationship to patient: Self Can be reached: [phone] Pharmacy:  Reason for call: Patient states that she can not afford [place] and wants to know if there is an alternate

## 2015-10-22 NOTE — Telephone Encounter (Signed)
Message left to call the office.    KP 

## 2015-10-22 NOTE — Telephone Encounter (Signed)
[phone] patient is returning your call

## 2015-10-25 DIAGNOSIS — M75121 Complete rotator cuff tear or rupture of right shoulder, not specified as traumatic: Secondary | ICD-10-CM | POA: Diagnosis not present

## 2015-10-25 DIAGNOSIS — M25511 Pain in right shoulder: Secondary | ICD-10-CM | POA: Diagnosis not present

## 2015-10-25 DIAGNOSIS — M25611 Stiffness of right shoulder, not elsewhere classified: Secondary | ICD-10-CM | POA: Diagnosis not present

## 2015-10-28 DIAGNOSIS — M25511 Pain in right shoulder: Secondary | ICD-10-CM | POA: Diagnosis not present

## 2015-10-28 DIAGNOSIS — M75121 Complete rotator cuff tear or rupture of right shoulder, not specified as traumatic: Secondary | ICD-10-CM | POA: Diagnosis not present

## 2015-10-28 DIAGNOSIS — M25611 Stiffness of right shoulder, not elsewhere classified: Secondary | ICD-10-CM | POA: Diagnosis not present

## 2015-10-29 ENCOUNTER — Telehealth: Payer: Self-pay | Admitting: Family Medicine

## 2015-10-29 NOTE — Telephone Encounter (Signed)
Pt dropped off applications for assistance with meds, pt states she is almost out and will come and pick up documents when available, documents placed in tray at front office

## 2015-11-01 ENCOUNTER — Other Ambulatory Visit: Payer: Self-pay | Admitting: Family Medicine

## 2015-11-01 DIAGNOSIS — Z9889 Other specified postprocedural states: Secondary | ICD-10-CM | POA: Diagnosis not present

## 2015-11-01 NOTE — Telephone Encounter (Signed)
D/c farxiga starlix60 mg tid  #90

## 2015-11-01 NOTE — Telephone Encounter (Signed)
TO MD.    Lois Huxley

## 2015-11-02 NOTE — Telephone Encounter (Signed)
Application received.  Form filled in as much as possible and forwarded to PCP for completion.

## 2015-11-03 NOTE — Telephone Encounter (Signed)
Received completed and signed Application for Free [place] forms from Dr. Carollee Herter.  Called pt and informed her that forms or completed and ready for her to pick up.  Pt verbalized understanding and stated that she will come and pick them up.  Form placed up front.//AB/CMA

## 2015-11-04 DIAGNOSIS — M25511 Pain in right shoulder: Secondary | ICD-10-CM | POA: Diagnosis not present

## 2015-11-04 DIAGNOSIS — M25611 Stiffness of right shoulder, not elsewhere classified: Secondary | ICD-10-CM | POA: Diagnosis not present

## 2015-11-04 DIAGNOSIS — M75121 Complete rotator cuff tear or rupture of right shoulder, not specified as traumatic: Secondary | ICD-10-CM | POA: Diagnosis not present

## 2015-11-04 MED ORDER — NATEGLINIDE 60 MG PO TABS: 60.0000 mg | ORAL_TABLET | Freq: Three times a day (TID) | ORAL | 2 refills | Status: DC

## 2015-11-04 MED ORDER — DAPAGLIFLOZIN PROPANEDIOL 10 MG PO TABS
10.0000 mg | ORAL_TABLET | Freq: Every day | ORAL | 3 refills | Status: DC
Start: 2015-11-04 — End: 2015-11-21

## 2015-11-04 MED ORDER — LINAGLIPTIN-METFORMIN HCL 2.5-1000 MG PO TABS: 1.0000 | ORAL_TABLET | Freq: Two times a day (BID) | ORAL | 3 refills | Status: DC

## 2015-11-04 NOTE — Telephone Encounter (Signed)
discussed with patient and she is trying to get assistance from the company, however she would like the medication sent to see what the cost of the Starlix would be, in case she is not approved. Rx faxed and paperwork given to the patient that has been signed by doctor Lowne.   KP

## 2015-11-08 DIAGNOSIS — M25611 Stiffness of right shoulder, not elsewhere classified: Secondary | ICD-10-CM | POA: Diagnosis not present

## 2015-11-08 DIAGNOSIS — M75121 Complete rotator cuff tear or rupture of right shoulder, not specified as traumatic: Secondary | ICD-10-CM | POA: Diagnosis not present

## 2015-11-08 DIAGNOSIS — M25511 Pain in right shoulder: Secondary | ICD-10-CM | POA: Diagnosis not present

## 2015-11-10 DIAGNOSIS — M25611 Stiffness of right shoulder, not elsewhere classified: Secondary | ICD-10-CM | POA: Diagnosis not present

## 2015-11-10 DIAGNOSIS — M25511 Pain in right shoulder: Secondary | ICD-10-CM | POA: Diagnosis not present

## 2015-11-10 DIAGNOSIS — M75121 Complete rotator cuff tear or rupture of right shoulder, not specified as traumatic: Secondary | ICD-10-CM | POA: Diagnosis not present

## 2015-11-16 ENCOUNTER — Other Ambulatory Visit: Payer: Self-pay | Admitting: Family Medicine

## 2015-11-16 DIAGNOSIS — M75121 Complete rotator cuff tear or rupture of right shoulder, not specified as traumatic: Secondary | ICD-10-CM | POA: Diagnosis not present

## 2015-11-16 DIAGNOSIS — Z1231 Encounter for screening mammogram for malignant neoplasm of breast: Secondary | ICD-10-CM

## 2015-11-16 DIAGNOSIS — M25511 Pain in right shoulder: Secondary | ICD-10-CM | POA: Diagnosis not present

## 2015-11-18 ENCOUNTER — Ambulatory Visit (INDEPENDENT_AMBULATORY_CARE_PROVIDER_SITE_OTHER): Payer: Medicare Other | Admitting: Family Medicine

## 2015-11-18 ENCOUNTER — Encounter: Payer: Self-pay | Admitting: Family Medicine

## 2015-11-18 VITALS — BP 118/66 | HR 66 | Temp 98.0°F | Wt 205.0 lb

## 2015-11-18 DIAGNOSIS — IMO0002 Reserved for concepts with insufficient information to code with codable children: Secondary | ICD-10-CM

## 2015-11-18 DIAGNOSIS — E1165 Type 2 diabetes mellitus with hyperglycemia: Secondary | ICD-10-CM

## 2015-11-18 DIAGNOSIS — E1151 Type 2 diabetes mellitus with diabetic peripheral angiopathy without gangrene: Secondary | ICD-10-CM | POA: Diagnosis not present

## 2015-11-18 DIAGNOSIS — Z23 Encounter for immunization: Secondary | ICD-10-CM | POA: Diagnosis not present

## 2015-11-18 DIAGNOSIS — E785 Hyperlipidemia, unspecified: Secondary | ICD-10-CM

## 2015-11-18 DIAGNOSIS — E876 Hypokalemia: Secondary | ICD-10-CM

## 2015-11-18 DIAGNOSIS — I10 Essential (primary) hypertension: Secondary | ICD-10-CM

## 2015-11-18 LAB — POCT URINALYSIS DIPSTICK
Bilirubin, UA: NEGATIVE
GLUCOSE UA: 1000
KETONES UA: NEGATIVE
Leukocytes, UA: NEGATIVE
Nitrite, UA: NEGATIVE
PROTEIN UA: NEGATIVE
RBC UA: NEGATIVE
Urobilinogen, UA: 0.2
pH, UA: 6

## 2015-11-18 LAB — LDL CHOLESTEROL, DIRECT: Direct LDL: 117 mg/dL

## 2015-11-18 LAB — LIPID PANEL
CHOL/HDL RATIO: 5
CHOLESTEROL: 211 mg/dL — AB (ref 0–200)
HDL: 43.7 mg/dL (ref >39.00)
NonHDL: 167.73
TRIGLYCERIDES: 355 mg/dL — AB (ref 0.0–149.0)
VLDL: 71 mg/dL — ABNORMAL HIGH (ref 0.0–40.0)

## 2015-11-18 LAB — COMPREHENSIVE METABOLIC PANEL
ALBUMIN: 4.6 g/dL (ref 3.5–5.2)
ALT: 18 U/L (ref 0–35)
AST: 23 U/L (ref 0–37)
Alkaline Phosphatase: 44 U/L (ref 39–117)
BILIRUBIN TOTAL: 0.5 mg/dL (ref 0.2–1.2)
BUN: 22 mg/dL (ref 6–23)
CHLORIDE: 100 meq/L (ref 96–112)
CO2: 29 mEq/L (ref 19–32)
CREATININE: 0.73 mg/dL (ref 0.40–1.20)
Calcium: 10 mg/dL (ref 8.4–10.5)
GFR: 83.69 mL/min (ref >60.00)
Glucose, Bld: 157 mg/dL — ABNORMAL HIGH (ref 70–99)
Potassium: 4 mEq/L (ref 3.5–5.1)
SODIUM: 137 meq/L (ref 135–145)
Total Protein: 7.6 g/dL (ref 6.0–8.3)

## 2015-11-18 LAB — HEMOGLOBIN A1C: Hgb A1c MFr Bld: 8.2 % — ABNORMAL HIGH (ref 4.6–6.5)

## 2015-11-18 NOTE — Progress Notes (Signed)
Pre visit review using our clinic review tool, if applicable. No additional management support is needed unless otherwise documented below in the visit note. 

## 2015-11-18 NOTE — Progress Notes (Signed)
Patient ID: Sandra Ray, female    DOB: [birth year]  Age: 70 y.o. MRN: [id number]    Subjective:  Subjective  HPI ZIYONA GAGEL presents for f/u bp, cholesterol and dm.  HPI HYPERTENSION   Blood pressure range-stable per pt  Chest pain- no    Dyspnea- no Lightheadedness- no   Edema- no  Other side effects - no   Medication compliance: good Low salt diet- yes    DIABETES    Blood Sugar ranges-121-151  Polyuria- no New Visual problems- no  Hypoglycemic symptoms- no  Other side effects-no Medication compliance - good Last eye exam- 01/2015 Foot exam- today   HYPERLIPIDEMIA  Medication compliance- good RUQ pain- no  Muscle aches- no Other side effects-no    Review of Systems  Constitutional: Negative for appetite change, diaphoresis, fatigue and unexpected weight change.  Eyes: Negative for pain, redness and visual disturbance.  Respiratory: Negative for cough, chest tightness, shortness of breath and wheezing.   Cardiovascular: Negative for chest pain, palpitations and leg swelling.  Endocrine: Negative for cold intolerance, heat intolerance, polydipsia, polyphagia and polyuria.  Genitourinary: Negative for difficulty urinating, dysuria and frequency.  Neurological: Negative for dizziness, light-headedness, numbness and headaches.    History Past Medical History:  Diagnosis Date  . Arthritis   . Breast abscess    right breast  . Carpal tunnel syndrome   . Degenerative disc disease   . Diabetes mellitus type II   . Eczema of hand   . Hx of colonic polyps 12/02/2014   2004 - 2 diminutive polyps   . Hyperlipidemia   . Hypertension   . Metatarsal deformity   . Shingles   . Tubular adenoma of colon     She has a past surgical history that includes Lumbar laminectomy; Cesarean section; Tubal ligation; Abdominal hysterectomy (1981); carpel tunnel (Right); Abscess drainage (Right); Appendectomy (1966); and Colonoscopy.   Her family history includes Breast cancer in  her daughter, maternal aunt, and sister; Colon polyps in her brother; Diabetes in her brother and mother; Heart disease in her mother; High Cholesterol in her mother; Hypertension in her father and mother; Stroke in her mother.She reports that she has never smoked. She has never used smokeless tobacco. She reports that she does not drink alcohol or use drugs.  Current Outpatient Prescriptions on File Prior to Visit  Medication Sig Dispense Refill  . acetaminophen (TYLENOL) 650 MG CR tablet Take 1,300 mg by mouth daily.     Marland Kitchen albuterol (VENTOLIN HFA) 108 (90 BASE) MCG/ACT inhaler Inhale 2 puffs into the lungs every 6 (six) hours as needed.      . Ascorbic Acid (VITAMIN C) 500 MG tablet Take 500 mg by mouth daily.      Marland Kitchen aspirin 81 MG tablet Take 81 mg by mouth daily.      . Cholecalciferol (VITAMIN D-3) 5000 UNITS TABS Take by mouth daily.     . Coenzyme Q10 100 MG TABS Take 400 mg by mouth daily.    . Garlic [id number] MG CAPS Take 1 capsule by mouth daily.     . promethazine (PHENERGAN) 25 MG tablet TAKE ONE TABLET BY MOUTH 4 TIMES DAILY AS NEEDED 90 tablet 0  . vitamin E 400 UNIT capsule Take 400 Units by mouth daily.      . nateglinide (STARLIX) 60 MG tablet Take 1 tablet (60 mg total) by mouth 3 (three) times daily with meals. (Patient not taking: Reported on 11/18/2015) 90 tablet 2  No current facility-administered medications on file prior to visit.      Objective:  Objective  Physical Exam  Constitutional: She is oriented to person, place, and time. She appears well-developed and well-nourished.  HENT:  Head: Normocephalic and atraumatic.  Eyes: Conjunctivae and EOM are normal.  Neck: Normal range of motion. Neck supple. No JVD present. Carotid bruit is not present. No thyromegaly present.  Cardiovascular: Normal rate, regular rhythm and normal heart sounds.   No murmur heard. Pulmonary/Chest: Effort normal and breath sounds normal. No respiratory distress. She has no wheezes. She has no  rales. She exhibits no tenderness.  Musculoskeletal: She exhibits no edema.  Neurological: She is alert and oriented to person, place, and time.  Psychiatric: She has a normal mood and affect. Her behavior is normal. Judgment and thought content normal.  Nursing note and vitals reviewed. monofilament---  Pt unable to feel monofilament on bottom of both feet and top L foot BP 118/66 (BP Location: Left Arm, Patient Position: Sitting, Cuff Size: Normal)   Pulse 66   Temp 98 F (36.7 C) (Oral)   Wt 205 lb (93 kg)   SpO2 98%   BMI 31.17 kg/m  Wt Readings from Last 3 Encounters:  11/18/15 205 lb (93 kg)  05/18/15 202 lb 12.8 oz (92 kg)  01/28/15 212 lb 6.4 oz (96.3 kg)     Lab Results  Component Value Date   WBC 8.5 05/18/2015   HGB 14.2 05/18/2015   HCT 42.3 05/18/2015   PLT 294.0 05/18/2015   GLUCOSE 157 (H) 11/18/2015   CHOL 211 (H) 11/18/2015   TRIG 355.0 (H) 11/18/2015   HDL 43.70 11/18/2015   LDLDIRECT 117.0 11/18/2015   LDLCALC 81 05/22/2014   ALT 18 11/18/2015   AST 23 11/18/2015   NA 137 11/18/2015   K 4.0 11/18/2015   CL 100 11/18/2015   CREATININE 0.73 11/18/2015   BUN 22 11/18/2015   CO2 29 11/18/2015   TSH 0.59 10/22/2014   INR 1.19 10/28/2009   HGBA1C 8.2 (H) 11/18/2015   MICROALBUR 0.7 03/28/2010    Mr Shoulder Right Wo Contrast  Result Date: 09/11/2015 CLINICAL DATA:  Right shoulder pain with painful range of motion. The patient fell 6 weeks ago. EXAM: MRI OF THE RIGHT SHOULDER WITHOUT CONTRAST TECHNIQUE: Multiplanar, multisequence MR imaging of the shoulder was performed. No intravenous contrast was administered. COMPARISON:  None. FINDINGS: Rotator cuff: There is a deep bursal surface non retracted tear of the distal supraspinatus tendon 17 mm in width. I suspect there is a tiny full-thickness component to the tear. The rest of the rotator cuff is intact. Muscles:  Normal. Biceps long head:  Properly located and intact. Acromioclavicular Joint: Severe AC  joint arthropathy with prominent inferior osteophyte on the distal clavicle which could predispose to impingement. Type 2 acromion. Fluid in the subacromial/subdeltoid bursae. Glenohumeral Joint: Small glenohumeral joint effusion. No appreciable arthritis. Labrum:  Normal. Bones:  Normal. Other: No adenopathy or mass lesions. IMPRESSION: 1. 17 mm wide deep bursal surface tear of the distal supraspinous tendon with a probable tiny full-thickness component. 2. Prominent inferior osteophyte on the distal clavicle which could predispose to impingement. Electronically Signed   By: Lorriane Shire M.D.   On: 09/11/2015 09:15     Assessment & Plan:  Plan  I have discontinued Ms. Rama's fenofibrate micronized. I have changed her KLOR-CON M20 to potassium chloride SA. I have also changed her fenofibrate micronized. Additionally, I am having her maintain her  aspirin, acetaminophen, albuterol, vitamin C, vitamin E, Vitamin D-3, Garlic, Coenzyme [id number], promethazine, nateglinide, simvastatin, lisinopril, Linagliptin-Metformin HCl, hydrochlorothiazide, glipiZIDE, gabapentin, and dapagliflozin propanediol.  Meds ordered this encounter  Medications  . simvastatin (ZOCOR) 40 MG tablet    Sig: Take 1 tablet (40 mg total) by mouth at bedtime.    Dispense:  90 tablet    Refill:  1  . lisinopril (PRINIVIL,ZESTRIL) 5 MG tablet    Sig: Take 1 tablet (5 mg total) by mouth daily.    Dispense:  90 tablet    Refill:  1  . Linagliptin-Metformin HCl (JENTADUETO) 2.07-998 MG TABS    Sig: Take 1 tablet by mouth 2 (two) times daily.    Dispense:  180 tablet    Refill:  3  . hydrochlorothiazide (HYDRODIURIL) 25 MG tablet    Sig: Take 1 tablet (25 mg total) by mouth daily.    Dispense:  90 tablet    Refill:  1  . potassium chloride SA (KLOR-CON M20) 20 MEQ tablet    Sig: Take 1 tablet (20 mEq total) by mouth daily.    Dispense:  90 tablet    Refill:  1  . glipiZIDE (GLUCOTROL) 10 MG tablet    Sig: Take 2 tablets (20 mg  total) by mouth 2 (two) times daily.    Dispense:  120 tablet    Refill:  5  . gabapentin (NEURONTIN) 300 MG capsule    Sig: Take 1 capsule (300 mg total) by mouth 3 (three) times daily.    Dispense:  90 capsule    Refill:  6  . fenofibrate micronized (ANTARA) 130 MG capsule    Sig: Take 1 capsule (130 mg total) by mouth daily.    Dispense:  90 capsule    Refill:  1  . dapagliflozin propanediol (FARXIGA) 10 MG TABS tablet    Sig: Take 10 mg by mouth daily.    Dispense:  90 tablet    Refill:  3    Problem List Items Addressed This Visit      Unprioritized   Essential hypertension    Stable con't meds       Relevant Medications   simvastatin (ZOCOR) 40 MG tablet   lisinopril (PRINIVIL,ZESTRIL) 5 MG tablet   hydrochlorothiazide (HYDRODIURIL) 25 MG tablet   fenofibrate micronized (ANTARA) 130 MG capsule   Other Relevant Orders   Lipid panel (Completed)   POCT urinalysis dipstick (Completed)   Hyperlipidemia LDL goal <70    con't meds Check labs      Relevant Medications   simvastatin (ZOCOR) 40 MG tablet   lisinopril (PRINIVIL,ZESTRIL) 5 MG tablet   hydrochlorothiazide (HYDRODIURIL) 25 MG tablet   fenofibrate micronized (ANTARA) 130 MG capsule   Other Relevant Orders   Lipid panel (Completed)   POCT urinalysis dipstick (Completed)    Other Visit Diagnoses    DM (diabetes mellitus) type II uncontrolled, periph vascular disorder (HCC)    -  Primary   Relevant Medications   simvastatin (ZOCOR) 40 MG tablet   lisinopril (PRINIVIL,ZESTRIL) 5 MG tablet   Linagliptin-Metformin HCl (JENTADUETO) 2.07-998 MG TABS   hydrochlorothiazide (HYDRODIURIL) 25 MG tablet   glipiZIDE (GLUCOTROL) 10 MG tablet   fenofibrate micronized (ANTARA) 130 MG capsule   dapagliflozin propanediol (FARXIGA) 10 MG TABS tablet   Other Relevant Orders   Hemoglobin A1c (Completed)   Comprehensive metabolic panel (Completed)   POCT urinalysis dipstick (Completed)   Need for prophylactic vaccination  and inoculation against influenza  Relevant Orders   Flu vaccine HIGH DOSE PF (Fluzone High dose) (Completed)   Essential hypertension, benign       Relevant Medications   simvastatin (ZOCOR) 40 MG tablet   lisinopril (PRINIVIL,ZESTRIL) 5 MG tablet   hydrochlorothiazide (HYDRODIURIL) 25 MG tablet   fenofibrate micronized (ANTARA) 130 MG capsule   Type II diabetes mellitus with peripheral artery disease (HCC)       Relevant Medications   simvastatin (ZOCOR) 40 MG tablet   lisinopril (PRINIVIL,ZESTRIL) 5 MG tablet   Linagliptin-Metformin HCl (JENTADUETO) 2.07-998 MG TABS   hydrochlorothiazide (HYDRODIURIL) 25 MG tablet   glipiZIDE (GLUCOTROL) 10 MG tablet   gabapentin (NEURONTIN) 300 MG capsule   fenofibrate micronized (ANTARA) 130 MG capsule   dapagliflozin propanediol (FARXIGA) 10 MG TABS tablet   Hypokalemia       Relevant Medications   potassium chloride SA (KLOR-CON M20) 20 MEQ tablet      Follow-up: Return in about 6 months (around 05/17/2016) for hypertension, hyperlipidemia, diabetes II.  Ann Held, DO

## 2015-11-18 NOTE — Patient Instructions (Signed)

## 2015-11-19 DIAGNOSIS — M75121 Complete rotator cuff tear or rupture of right shoulder, not specified as traumatic: Secondary | ICD-10-CM | POA: Diagnosis not present

## 2015-11-19 DIAGNOSIS — M25511 Pain in right shoulder: Secondary | ICD-10-CM | POA: Diagnosis not present

## 2015-11-19 DIAGNOSIS — M25611 Stiffness of right shoulder, not elsewhere classified: Secondary | ICD-10-CM | POA: Diagnosis not present

## 2015-11-21 DIAGNOSIS — E785 Hyperlipidemia, unspecified: Secondary | ICD-10-CM | POA: Insufficient documentation

## 2015-11-21 DIAGNOSIS — E1169 Type 2 diabetes mellitus with other specified complication: Secondary | ICD-10-CM | POA: Insufficient documentation

## 2015-11-21 MED ORDER — GABAPENTIN 300 MG PO CAPS: 300.0000 mg | ORAL_CAPSULE | Freq: Three times a day (TID) | ORAL | 6 refills | Status: DC

## 2015-11-21 MED ORDER — SIMVASTATIN 40 MG PO TABS: 40.0000 mg | ORAL_TABLET | Freq: Every day | ORAL | 1 refills | Status: DC

## 2015-11-21 MED ORDER — GLIPIZIDE 10 MG PO TABS
20.0000 mg | ORAL_TABLET | Freq: Two times a day (BID) | ORAL | 5 refills | Status: DC
Start: 2015-11-21 — End: 2015-12-02

## 2015-11-21 MED ORDER — FENOFIBRATE MICRONIZED 130 MG PO CAPS: 130.0000 mg | ORAL_CAPSULE | Freq: Every day | ORAL | 1 refills | Status: DC

## 2015-11-21 MED ORDER — LISINOPRIL 5 MG PO TABS: 5.0000 mg | ORAL_TABLET | Freq: Every day | ORAL | 1 refills | Status: DC

## 2015-11-21 MED ORDER — LINAGLIPTIN-METFORMIN HCL 2.5-1000 MG PO TABS: 1.0000 | ORAL_TABLET | Freq: Two times a day (BID) | ORAL | 3 refills | Status: DC

## 2015-11-21 MED ORDER — DAPAGLIFLOZIN PROPANEDIOL 10 MG PO TABS: 10.0000 mg | ORAL_TABLET | Freq: Every day | ORAL | 3 refills | Status: DC

## 2015-11-21 MED ORDER — HYDROCHLOROTHIAZIDE 25 MG PO TABS: 25.0000 mg | ORAL_TABLET | Freq: Every day | ORAL | 1 refills | Status: DC

## 2015-11-21 MED ORDER — POTASSIUM CHLORIDE CRYS ER 20 MEQ PO TBCR: 20.0000 meq | EXTENDED_RELEASE_TABLET | Freq: Every day | ORAL | 1 refills | Status: DC

## 2015-11-21 NOTE — Assessment & Plan Note (Signed)
con't meds  Check labs 

## 2015-11-21 NOTE — Assessment & Plan Note (Signed)
Stable con't meds 

## 2015-11-21 NOTE — Assessment & Plan Note (Signed)
Stable Check labs  con't meds 

## 2015-11-22 MED ORDER — DAPAGLIFLOZIN PROPANEDIOL 10 MG PO TABS: 10.0000 mg | ORAL_TABLET | Freq: Every day | ORAL | 4 refills | Status: DC

## 2015-11-22 NOTE — Addendum Note (Signed)
Addended by: Rudene Anda on: 11/22/2015 03:37 PM   Modules accepted: Orders

## 2015-11-25 ENCOUNTER — Telehealth: Payer: Self-pay

## 2015-11-25 NOTE — Telephone Encounter (Signed)
Patient assistance forms mailed to [employer] along with the original Rx for [place].

## 2015-11-30 ENCOUNTER — Other Ambulatory Visit: Payer: Self-pay

## 2015-11-30 MED ORDER — NATEGLINIDE 60 MG PO TABS: 60.0000 mg | ORAL_TABLET | Freq: Three times a day (TID) | ORAL | 0 refills | Status: DC

## 2015-12-01 DIAGNOSIS — M25511 Pain in right shoulder: Secondary | ICD-10-CM | POA: Diagnosis not present

## 2015-12-01 DIAGNOSIS — M25611 Stiffness of right shoulder, not elsewhere classified: Secondary | ICD-10-CM | POA: Diagnosis not present

## 2015-12-01 DIAGNOSIS — M75121 Complete rotator cuff tear or rupture of right shoulder, not specified as traumatic: Secondary | ICD-10-CM | POA: Diagnosis not present

## 2015-12-02 ENCOUNTER — Other Ambulatory Visit: Payer: Self-pay

## 2015-12-02 DIAGNOSIS — E1165 Type 2 diabetes mellitus with hyperglycemia: Principal | ICD-10-CM

## 2015-12-02 DIAGNOSIS — E1151 Type 2 diabetes mellitus with diabetic peripheral angiopathy without gangrene: Secondary | ICD-10-CM

## 2015-12-02 DIAGNOSIS — IMO0002 Reserved for concepts with insufficient information to code with codable children: Secondary | ICD-10-CM

## 2015-12-02 MED ORDER — GLIPIZIDE 10 MG PO TABS: 20.0000 mg | ORAL_TABLET | Freq: Two times a day (BID) | ORAL | 5 refills | Status: DC

## 2015-12-07 DIAGNOSIS — M25511 Pain in right shoulder: Secondary | ICD-10-CM | POA: Diagnosis not present

## 2015-12-07 DIAGNOSIS — M25611 Stiffness of right shoulder, not elsewhere classified: Secondary | ICD-10-CM | POA: Diagnosis not present

## 2015-12-07 DIAGNOSIS — M75121 Complete rotator cuff tear or rupture of right shoulder, not specified as traumatic: Secondary | ICD-10-CM | POA: Diagnosis not present

## 2015-12-10 DIAGNOSIS — M25511 Pain in right shoulder: Secondary | ICD-10-CM | POA: Diagnosis not present

## 2015-12-10 DIAGNOSIS — M75121 Complete rotator cuff tear or rupture of right shoulder, not specified as traumatic: Secondary | ICD-10-CM | POA: Diagnosis not present

## 2015-12-10 DIAGNOSIS — M25611 Stiffness of right shoulder, not elsewhere classified: Secondary | ICD-10-CM | POA: Diagnosis not present

## 2015-12-13 DIAGNOSIS — M25511 Pain in right shoulder: Secondary | ICD-10-CM | POA: Diagnosis not present

## 2015-12-13 DIAGNOSIS — M75121 Complete rotator cuff tear or rupture of right shoulder, not specified as traumatic: Secondary | ICD-10-CM | POA: Diagnosis not present

## 2015-12-13 DIAGNOSIS — M25611 Stiffness of right shoulder, not elsewhere classified: Secondary | ICD-10-CM | POA: Diagnosis not present

## 2015-12-13 DIAGNOSIS — Z9889 Other specified postprocedural states: Secondary | ICD-10-CM | POA: Diagnosis not present

## 2015-12-15 DIAGNOSIS — M75121 Complete rotator cuff tear or rupture of right shoulder, not specified as traumatic: Secondary | ICD-10-CM | POA: Diagnosis not present

## 2015-12-15 DIAGNOSIS — M25611 Stiffness of right shoulder, not elsewhere classified: Secondary | ICD-10-CM | POA: Diagnosis not present

## 2015-12-15 DIAGNOSIS — M25511 Pain in right shoulder: Secondary | ICD-10-CM | POA: Diagnosis not present

## 2015-12-16 ENCOUNTER — Other Ambulatory Visit: Payer: Self-pay | Admitting: Family Medicine

## 2015-12-16 ENCOUNTER — Ambulatory Visit
Admission: RE | Admit: 2015-12-16 | Discharge: 2015-12-16 | Disposition: A | Discharge status: Home / Self Care | Payer: Medicare Other | Source: Ambulatory Visit | Attending: Family Medicine | Admitting: Family Medicine

## 2015-12-16 DIAGNOSIS — Z1231 Encounter for screening mammogram for malignant neoplasm of breast: Secondary | ICD-10-CM

## 2015-12-20 DIAGNOSIS — M25511 Pain in right shoulder: Secondary | ICD-10-CM | POA: Diagnosis not present

## 2015-12-20 DIAGNOSIS — M75121 Complete rotator cuff tear or rupture of right shoulder, not specified as traumatic: Secondary | ICD-10-CM | POA: Diagnosis not present

## 2015-12-20 DIAGNOSIS — M25611 Stiffness of right shoulder, not elsewhere classified: Secondary | ICD-10-CM | POA: Diagnosis not present

## 2015-12-22 DIAGNOSIS — M25611 Stiffness of right shoulder, not elsewhere classified: Secondary | ICD-10-CM | POA: Diagnosis not present

## 2015-12-22 DIAGNOSIS — M25511 Pain in right shoulder: Secondary | ICD-10-CM | POA: Diagnosis not present

## 2015-12-22 DIAGNOSIS — M75121 Complete rotator cuff tear or rupture of right shoulder, not specified as traumatic: Secondary | ICD-10-CM | POA: Diagnosis not present

## 2015-12-27 ENCOUNTER — Other Ambulatory Visit: Payer: Self-pay | Admitting: Family Medicine

## 2015-12-27 DIAGNOSIS — E785 Hyperlipidemia, unspecified: Secondary | ICD-10-CM

## 2015-12-27 DIAGNOSIS — I10 Essential (primary) hypertension: Secondary | ICD-10-CM

## 2015-12-27 DIAGNOSIS — M25611 Stiffness of right shoulder, not elsewhere classified: Secondary | ICD-10-CM | POA: Diagnosis not present

## 2015-12-27 DIAGNOSIS — M75121 Complete rotator cuff tear or rupture of right shoulder, not specified as traumatic: Secondary | ICD-10-CM | POA: Diagnosis not present

## 2015-12-27 DIAGNOSIS — M25511 Pain in right shoulder: Secondary | ICD-10-CM | POA: Diagnosis not present

## 2015-12-28 ENCOUNTER — Other Ambulatory Visit: Payer: Self-pay | Admitting: Family Medicine

## 2015-12-30 DIAGNOSIS — M25611 Stiffness of right shoulder, not elsewhere classified: Secondary | ICD-10-CM | POA: Diagnosis not present

## 2015-12-30 DIAGNOSIS — M75121 Complete rotator cuff tear or rupture of right shoulder, not specified as traumatic: Secondary | ICD-10-CM | POA: Diagnosis not present

## 2015-12-30 DIAGNOSIS — M25511 Pain in right shoulder: Secondary | ICD-10-CM | POA: Diagnosis not present

## 2016-01-04 DIAGNOSIS — M75121 Complete rotator cuff tear or rupture of right shoulder, not specified as traumatic: Secondary | ICD-10-CM | POA: Diagnosis not present

## 2016-01-04 DIAGNOSIS — M25511 Pain in right shoulder: Secondary | ICD-10-CM | POA: Diagnosis not present

## 2016-01-04 DIAGNOSIS — M25611 Stiffness of right shoulder, not elsewhere classified: Secondary | ICD-10-CM | POA: Diagnosis not present

## 2016-01-06 ENCOUNTER — Other Ambulatory Visit: Payer: Self-pay | Admitting: Family Medicine

## 2016-01-06 DIAGNOSIS — E1151 Type 2 diabetes mellitus with diabetic peripheral angiopathy without gangrene: Secondary | ICD-10-CM

## 2016-01-24 DIAGNOSIS — M25511 Pain in right shoulder: Secondary | ICD-10-CM | POA: Diagnosis not present

## 2016-01-25 ENCOUNTER — Other Ambulatory Visit: Payer: Self-pay | Admitting: Family Medicine

## 2016-01-25 ENCOUNTER — Telehealth: Payer: Self-pay | Admitting: Family Medicine

## 2016-01-25 DIAGNOSIS — E1151 Type 2 diabetes mellitus with diabetic peripheral angiopathy without gangrene: Secondary | ICD-10-CM

## 2016-01-25 NOTE — Telephone Encounter (Signed)
Pt dropped off documents for patient assistance program, pt states forms is needed by 01/2016 if possible, forms were placed in tray at front desk

## 2016-01-28 MED ORDER — JENTADUETO 2.5-1000 MG PO TABS: 1.0000 | ORAL_TABLET | Freq: Two times a day (BID) | ORAL | 4 refills | Status: DC

## 2016-01-28 MED ORDER — JENTADUETO 2.5-1000 MG PO TABS: 1.0000 | ORAL_TABLET | Freq: Two times a day (BID) | ORAL | 1 refills | Status: DC

## 2016-01-28 NOTE — Telephone Encounter (Signed)
Received completed and signed Patient Assistance program along with prescription.  Form and prescription given to the pt.//AB/CMA

## 2016-02-12 ENCOUNTER — Other Ambulatory Visit: Payer: Self-pay | Admitting: Family Medicine

## 2016-02-12 DIAGNOSIS — I10 Essential (primary) hypertension: Secondary | ICD-10-CM

## 2016-02-12 DIAGNOSIS — E785 Hyperlipidemia, unspecified: Secondary | ICD-10-CM

## 2016-03-20 NOTE — Progress Notes (Deleted)
Pre visit review using our clinic review tool, if applicable. No additional management support is needed unless otherwise documented below in the visit note. 

## 2016-03-20 NOTE — Progress Notes (Deleted)
Subjective:   Sandra Ray is a 71 y.o. female who presents for Medicare Annual (Subsequent) preventive examination.  Review of Systems:  No ROS.  Medicare Wellness Visit.   Sleep patterns:  Home Safety/Smoke Alarms:   Living environment; residence and Firearm Safety: . Seat Belt Safety/Bike Helmet: Wears seat belt.   Counseling:   Eye Exam-  Dental-  Female:   Pap-       Mammo- Last 12/20/15: BI-RADS CATEGORY  1: Negative.       Dexa scan-  Last 10/16/13: Normal. Routine testing is allowed once every 2 years. CCS- Last 01/19/15: Normal. No repeat screening advised per report.     Objective:     Vitals: There were no vitals taken for this visit.  There is no height or weight on file to calculate BMI.   Tobacco History  Smoking Status  . Never Smoker  Smokeless Tobacco  . Never Used     Counseling given: Not Answered   Past Medical History:  Diagnosis Date  . Arthritis   . Breast abscess    right breast  . Carpal tunnel syndrome   . Degenerative disc disease   . Diabetes mellitus type II   . Eczema of hand   . Hx of colonic polyps 12/02/2014   2004 - 2 diminutive polyps   . Hyperlipidemia   . Hypertension   . Metatarsal deformity   . Shingles   . Tubular adenoma of colon    Past Surgical History:  Procedure Laterality Date  . ABDOMINAL HYSTERECTOMY  1981  . ABSCESS DRAINAGE Right    breast  . APPENDECTOMY  1966  . carpel tunnel Right   . CESAREAN SECTION     x2  . COLONOSCOPY    . LUMBAR LAMINECTOMY     [id number]. 2011 L4,L5,S1  . TUBAL LIGATION     Family History  Problem Relation Age of Onset  . Hypertension Father   . Diabetes Mother   . Stroke Mother   . Hypertension Mother   . High Cholesterol Mother   . Breast cancer Maternal Aunt   . Breast cancer Sister   . Diabetes Brother   . Breast cancer Daughter   . Colon polyps Brother   . Heart disease Mother    History  Sexual Activity  . Sexual activity: Not Currently     Outpatient Encounter Prescriptions as of 03/21/2016  Medication Sig  . acetaminophen (TYLENOL) 650 MG CR tablet Take 1,300 mg by mouth daily.   Marland Kitchen albuterol (VENTOLIN HFA) 108 (90 BASE) MCG/ACT inhaler Inhale 2 puffs into the lungs every 6 (six) hours as needed.    . Ascorbic Acid (VITAMIN C) 500 MG tablet Take 500 mg by mouth daily.    Marland Kitchen aspirin 81 MG tablet Take 81 mg by mouth daily.    . Cholecalciferol (VITAMIN D-3) 5000 UNITS TABS Take by mouth daily.   . Coenzyme Q10 100 MG TABS Take 400 mg by mouth daily.  . dapagliflozin propanediol (FARXIGA) 10 MG TABS tablet Take 10 mg by mouth daily.  . fenofibrate micronized (ANTARA) 130 MG capsule Take 1 capsule (130 mg total) by mouth daily.  . fenofibrate micronized (ANTARA) 130 MG capsule TAKE ONE CAPSULE BY MOUTH ONCE DAILY  . gabapentin (NEURONTIN) 300 MG capsule TAKE ONE CAPSULE BY MOUTH THREE TIMES DAILY  . Garlic [id number] MG CAPS Take 1 capsule by mouth daily.   Marland Kitchen glipiZIDE (GLUCOTROL) 10 MG tablet Take 2 tablets (20 mg  total) by mouth 2 (two) times daily.  . hydrochlorothiazide (HYDRODIURIL) 25 MG tablet Take 1 tablet (25 mg total) by mouth daily.  . hydrochlorothiazide (HYDRODIURIL) 25 MG tablet TAKE ONE TABLET BY MOUTH ONCE DAILY  . JENTADUETO 2.07-998 MG TABS Take 1 tablet by mouth 2 (two) times daily.  Marland Kitchen KLOR-CON M20 20 MEQ tablet TAKE ONE TABLET BY MOUTH ONCE DAILY  . lisinopril (PRINIVIL,ZESTRIL) 5 MG tablet Take 1 tablet (5 mg total) by mouth daily.  Marland Kitchen lisinopril (PRINIVIL,ZESTRIL) 5 MG tablet TAKE ONE TABLET BY MOUTH ONCE DAILY  . nateglinide (STARLIX) 60 MG tablet TAKE 1 TABLET (60 MG TOTAL) BY MOUTH 3 (THREE) TIMES DAILY WITH MEALS.  Marland Kitchen promethazine (PHENERGAN) 25 MG tablet TAKE ONE TABLET BY MOUTH 4 TIMES DAILY AS NEEDED  . simvastatin (ZOCOR) 40 MG tablet Take 1 tablet (40 mg total) by mouth at bedtime.  . simvastatin (ZOCOR) 40 MG tablet TAKE ONE TABLET BY MOUTH AT BEDTIME  . vitamin E 400 UNIT capsule Take 400 Units by mouth  daily.     No facility-administered encounter medications on file as of 03/21/2016.     Activities of Daily Living In your present state of health, do you have any difficulty performing the following activities: 11/18/2015  Hearing? N  Vision? N  Difficulty concentrating or making decisions? Y  Walking or climbing stairs? N  Dressing or bathing? N  Doing errands, shopping? N  Some recent data might be hidden    Patient Care Team: Ann Held, DO as PCP - General (Family Medicine) Gatha Mayer, MD as Consulting Physician (Gastroenterology) Myeong Roxine Caddy, DPM as Consulting Physician (Podiatry) My Margret Chance, OD as Referring Physician (Optometry)    Assessment:    Physical assessment deferred to PCP.  Exercise Activities and Dietary recommendations   Diet (meal preparation, eat out, water intake, caffeinated beverages, dairy products, fruits and vegetables): {Desc; diets:16563} Breakfast: Lunch:  Dinner:      Goals    None     Fall Risk Fall Risk  11/18/2015 10/22/2014 10/22/2014 06/23/2013 06/21/2012  Falls in the past year? Yes No No No No  Number falls in past yr: 1 - - - -  Injury with Fall? Yes - - - -  Risk for fall due to : Other (Comment) - - - -  Risk for fall due to (comments): missed 2 steps and fell - - - -   Depression Screen PHQ 2/9 Scores 11/18/2015 10/22/2014 10/22/2014 06/23/2013  PHQ - 2 Score 0 0 0 0     Cognitive Function        Immunization History  Administered Date(s) Administered  . Influenza Split 12/19/2011  . Influenza Whole 12/17/2008, 12/22/2009  . Influenza, High Dose Seasonal PF 12/25/2014, 11/18/2015  . Influenza,inj,Quad PF,36+ Mos 12/18/2012, 12/23/2013  . Pneumococcal Conjugate-13 12/23/2013, 08/30/2015  . Pneumococcal Polysaccharide-23 06/19/2011  . Td 02/23/2004  . Tdap 01/30/2015  . Zoster 03/28/2010   Screening Tests Health Maintenance  Topic Date Due  . OPHTHALMOLOGY EXAM  01/12/2016  . FOOT EXAM  05/17/2016  .  HEMOGLOBIN A1C  05/17/2016  . MAMMOGRAM  12/15/2017  . COLONOSCOPY  01/18/2025  . TETANUS/TDAP  01/29/2025  . INFLUENZA VACCINE  Completed  . DEXA SCAN  Completed  . ZOSTAVAX  Completed  . Hepatitis C Screening  Completed  . PNA vac Low Risk Adult  Completed      Plan:   *** During the course of the visit the patient  was educated and counseled about the following appropriate screening and preventive services:   Vaccines to include Pneumoccal, Influenza, Hepatitis B, Td, Zostavax, HCV  Cardiovascular Disease  Colorectal cancer screening  Bone density screening  Diabetes screening  Glaucoma screening  Mammography/PAP  Nutrition counseling   Patient Instructions (the written plan) was given to the patient.   Shela Nevin, [place]  03/20/2016

## 2016-03-21 ENCOUNTER — Ambulatory Visit: Payer: Medicare Other | Admitting: *Deleted

## 2016-03-27 NOTE — Progress Notes (Addendum)
Subjective:   Sandra Ray is a 71 y.o. female who presents for Medicare Annual (Subsequent) preventive examination.  Review of Systems:  No ROS.  Medicare Wellness Visit. Cardiac Risk Factors include: advanced age (>72men, >21 women);diabetes mellitus;dyslipidemia;hypertension;sedentary lifestyle;obesity (BMI >30kg/m2) Sleep patterns: Takes Tylenol PM for sleep. Sometimes has restless sleep. Sleeps about 8 hrs per night.  Home Safety/Smoke Alarms: Feels safe in home. Smoke alarms in place.   Living environment; residence and Firearm Safety: Lives at home with husband, granddaughter and her husband. Two story home. No guns.  Seat Belt Safety/Bike Helmet: Wears seat belt.   Counseling:   Eye Exam- Wears glasses. [facility] annually for diabetic eye exam. Pt states she will schedule appt this week. Dental- Dr.Taylor as needed.  Female:   Pap- hysterectomy.  Mammo- last 12/20/15: BI-RADS CATEGORY  1: Negative.       Dexa scan- Last 10/16/13: Normal.       CCS- Last 01/19/15: Normal. No routine repeat recommended due to age.     Objective:     Vitals: BP 140/76 (BP Location: Right Arm, Patient Position: Sitting, Cuff Size: Normal)   Pulse 76   Ht 5\' 8"  (1.727 m)   Wt 207 lb 8 oz (94.1 kg)   SpO2 97%   BMI 31.55 kg/m   Body mass index is 31.55 kg/m.   Tobacco History  Smoking Status  . Never Smoker  Smokeless Tobacco  . Never Used     Counseling given: Not Answered   Past Medical History:  Diagnosis Date  . Arthritis   . Breast abscess    right breast  . Carpal tunnel syndrome   . Degenerative disc disease   . Diabetes mellitus type II   . Eczema of hand   . Hx of colonic polyps 12/02/2014   2004 - 2 diminutive polyps   . Hyperlipidemia   . Hypertension   . Metatarsal deformity   . Shingles   . Tubular adenoma of colon    Past Surgical History:  Procedure Laterality Date  . ABDOMINAL HYSTERECTOMY  1981  . ABSCESS DRAINAGE Right    breast  . APPENDECTOMY   1966  . carpel tunnel Right   . CESAREAN SECTION     x2  . COLONOSCOPY    . LUMBAR LAMINECTOMY     [id number]. 2011 L4,L5,S1  . Right rotator cuff repair 09/2015    . TUBAL LIGATION     Family History  Problem Relation Age of Onset  . Hypertension Father   . Diabetes Mother   . Stroke Mother   . Hypertension Mother   . High Cholesterol Mother   . Heart disease Mother   . Diabetes Brother   . Breast cancer Daughter   . Breast cancer Maternal Aunt   . Breast cancer Sister   . Colon polyps Brother    History  Sexual Activity  . Sexual activity: Not Currently    Outpatient Encounter Prescriptions as of 03/28/2016  Medication Sig  . acetaminophen (TYLENOL) 650 MG CR tablet Take 1,300 mg by mouth daily.   Marland Kitchen albuterol (VENTOLIN HFA) 108 (90 BASE) MCG/ACT inhaler Inhale 2 puffs into the lungs every 6 (six) hours as needed.    . Ascorbic Acid (VITAMIN C) 500 MG tablet Take 500 mg by mouth daily.    Marland Kitchen aspirin 81 MG tablet Take 81 mg by mouth daily.    . Coenzyme Q10 100 MG TABS Take 400 mg by mouth daily.  Marland Kitchen  dapagliflozin propanediol (FARXIGA) 10 MG TABS tablet Take 10 mg by mouth daily.  . fenofibrate micronized (ANTARA) 130 MG capsule Take 1 capsule (130 mg total) by mouth daily.  Marland Kitchen gabapentin (NEURONTIN) 300 MG capsule TAKE ONE CAPSULE BY MOUTH THREE TIMES DAILY  . Garlic 123XX123 MG CAPS Take 1 capsule by mouth daily.   Marland Kitchen glipiZIDE (GLUCOTROL) 10 MG tablet Take 2 tablets (20 mg total) by mouth 2 (two) times daily.  . hydrochlorothiazide (HYDRODIURIL) 25 MG tablet Take 1 tablet (25 mg total) by mouth daily.  . JENTADUETO 2.07-998 MG TABS Take 1 tablet by mouth 2 (two) times daily.  Marland Kitchen KLOR-CON M20 20 MEQ tablet TAKE ONE TABLET BY MOUTH ONCE DAILY  . lisinopril (PRINIVIL,ZESTRIL) 5 MG tablet Take 1 tablet (5 mg total) by mouth daily.  . promethazine (PHENERGAN) 25 MG tablet TAKE ONE TABLET BY MOUTH 4 TIMES DAILY AS NEEDED  . simvastatin (ZOCOR) 40 MG tablet Take 1 tablet (40 mg total)  by mouth at bedtime.  . vitamin E 400 UNIT capsule Take 400 Units by mouth daily.    . Cholecalciferol (VITAMIN D-3) 5000 UNITS TABS Take by mouth daily.   . fenofibrate micronized (ANTARA) 130 MG capsule TAKE ONE CAPSULE BY MOUTH ONCE DAILY  . hydrochlorothiazide (HYDRODIURIL) 25 MG tablet TAKE ONE TABLET BY MOUTH ONCE DAILY  . lisinopril (PRINIVIL,ZESTRIL) 5 MG tablet TAKE ONE TABLET BY MOUTH ONCE DAILY  . nateglinide (STARLIX) 60 MG tablet TAKE 1 TABLET (60 MG TOTAL) BY MOUTH 3 (THREE) TIMES DAILY WITH MEALS. (Patient not taking: Reported on 03/28/2016)  . simvastatin (ZOCOR) 40 MG tablet TAKE ONE TABLET BY MOUTH AT BEDTIME   No facility-administered encounter medications on file as of 03/28/2016.     Activities of Daily Living In your present state of health, do you have any difficulty performing the following activities: 03/28/2016 11/18/2015  Hearing? N N  Vision? N N  Difficulty concentrating or making decisions? N Y  Walking or climbing stairs? N N  Dressing or bathing? N N  Doing errands, shopping? N N  Preparing Food and eating ? N -  Using the Toilet? N -  In the past six months, have you accidently leaked urine? Y -  Do you have problems with loss of bowel control? N -  Managing your Medications? N -  Managing your Finances? N -  Housekeeping or managing your Housekeeping? N -  Some recent data might be hidden    Patient Care Team: Ann Held, DO as PCP - General (Family Medicine) Gatha Mayer, MD as Consulting Physician (Gastroenterology) Myeong Roxine Caddy, DPM as Consulting Physician (Podiatry) My Margret Chance, OD as Referring Physician (Optometry)    Assessment:    Physical assessment deferred to PCP.  Exercise Activities and Dietary recommendations   Diet (meal preparation, eat out, water intake, caffeinated beverages, dairy products, fruits and vegetables): in general, a "healthy" diet  , well balanced, on average, 3 meals per day   Goals    . Weight  (lb) < 200 lb (90.7 kg)      Fall Risk Fall Risk  03/28/2016 11/18/2015 10/22/2014 10/22/2014 06/23/2013  Falls in the past year? Yes Yes No No No  Number falls in past yr: 1 1 - - -  Injury with Fall? Yes Yes - - -  Risk for fall due to : - Other (Comment) - - -  Risk for fall due to (comments): - missed 2 steps and fell - - -  Follow up Education provided;Falls prevention discussed - - - -   Depression Screen PHQ 2/9 Scores 03/28/2016 11/18/2015 10/22/2014 10/22/2014  PHQ - 2 Score 0 0 0 0     Cognitive Function MMSE - Mini Mental State Exam 03/28/2016  Orientation to time 5  Orientation to Place 5  Registration 3  Attention/ Calculation 5  Recall 3  Language- name 2 objects 2  Language- repeat 1  Language- follow 3 step command 3  Language- read & follow direction 1  Write a sentence 1  Copy design 1  Total score 30        Immunization History  Administered Date(s) Administered  . Influenza Split 12/19/2011  . Influenza Whole 12/17/2008, 12/22/2009  . Influenza, High Dose Seasonal PF 12/25/2014, 11/18/2015  . Influenza,inj,Quad PF,36+ Mos 12/18/2012, 12/23/2013  . Pneumococcal Conjugate-13 12/23/2013, 08/30/2015  . Pneumococcal Polysaccharide-23 06/19/2011  . Td 02/23/2004  . Tdap 01/30/2015  . Zoster 03/28/2010   Screening Tests Health Maintenance  Topic Date Due  . OPHTHALMOLOGY EXAM  01/12/2016  . FOOT EXAM  05/17/2016  . HEMOGLOBIN A1C  05/17/2016  . MAMMOGRAM  12/15/2017  . COLONOSCOPY  01/18/2025  . TETANUS/TDAP  01/29/2025  . INFLUENZA VACCINE  Completed  . DEXA SCAN  Completed  . ZOSTAVAX  Completed  . Hepatitis C Screening  Completed  . PNA vac Low Risk Adult  Completed      Plan:   Follow up with Dr.Lowne as scheduled 05/18/16. Continue to eat heart healthy diet (full of fruits, vegetables, whole grains, lean protein, water--limit salt, fat, and sugar intake) and increase physical activity as tolerated. Bring a copy of your advance directives to your  next office visit.  During the course of the visit the patient was educated and counseled about the following appropriate screening and preventive services:   Vaccines to include Pneumoccal, Influenza, Hepatitis B, Td, Zostavax, HCV  Cardiovascular Disease  Colorectal cancer screening  Bone density screening  Diabetes screening  Glaucoma screening  Mammography/PAP  Nutrition counseling   Patient Instructions (the written plan) was given to the patient.   Shela Nevin, [place]  03/28/2016   Reviewed--- [email or web address], do 04/11/2016

## 2016-03-27 NOTE — Progress Notes (Signed)
Pre visit review using our clinic review tool, if applicable. No additional management support is needed unless otherwise documented below in the visit note. 

## 2016-03-28 ENCOUNTER — Ambulatory Visit (INDEPENDENT_AMBULATORY_CARE_PROVIDER_SITE_OTHER): Payer: Medicare Other | Admitting: *Deleted

## 2016-03-28 ENCOUNTER — Encounter: Payer: Self-pay | Admitting: *Deleted

## 2016-03-28 VITALS — BP 140/76 | HR 76 | Ht 68.0 in | Wt 207.5 lb

## 2016-03-28 DIAGNOSIS — Z0000 Encounter for general adult medical examination without abnormal findings: Secondary | ICD-10-CM | POA: Diagnosis not present

## 2016-03-28 NOTE — Patient Instructions (Signed)
Follow up with Dr.Lowne as scheduled 05/18/16.  Continue to eat heart healthy diet (full of fruits, vegetables, whole grains, lean protein, water--limit salt, fat, and sugar intake) and increase physical activity as tolerated.  Bring a copy of your advance directives to your next office visit.

## 2016-05-08 DIAGNOSIS — E119 Type 2 diabetes mellitus without complications: Secondary | ICD-10-CM | POA: Diagnosis not present

## 2016-05-18 ENCOUNTER — Encounter: Payer: Self-pay | Admitting: Family Medicine

## 2016-05-18 ENCOUNTER — Ambulatory Visit (INDEPENDENT_AMBULATORY_CARE_PROVIDER_SITE_OTHER): Payer: Medicare Other | Admitting: Family Medicine

## 2016-05-18 VITALS — BP 132/68 | HR 77 | Temp 97.4°F | Resp 16 | Ht 68.0 in | Wt 203.8 lb

## 2016-05-18 DIAGNOSIS — I10 Essential (primary) hypertension: Secondary | ICD-10-CM | POA: Diagnosis not present

## 2016-05-18 DIAGNOSIS — R110 Nausea: Secondary | ICD-10-CM

## 2016-05-18 DIAGNOSIS — IMO0002 Reserved for concepts with insufficient information to code with codable children: Secondary | ICD-10-CM

## 2016-05-18 DIAGNOSIS — E785 Hyperlipidemia, unspecified: Secondary | ICD-10-CM | POA: Diagnosis not present

## 2016-05-18 DIAGNOSIS — E1151 Type 2 diabetes mellitus with diabetic peripheral angiopathy without gangrene: Secondary | ICD-10-CM | POA: Diagnosis not present

## 2016-05-18 DIAGNOSIS — E1165 Type 2 diabetes mellitus with hyperglycemia: Secondary | ICD-10-CM | POA: Diagnosis not present

## 2016-05-18 DIAGNOSIS — E876 Hypokalemia: Secondary | ICD-10-CM | POA: Diagnosis not present

## 2016-05-18 DIAGNOSIS — Z23 Encounter for immunization: Secondary | ICD-10-CM

## 2016-05-18 LAB — COMPREHENSIVE METABOLIC PANEL
ALK PHOS: 43 U/L (ref 39–117)
ALT: 21 U/L (ref 0–35)
AST: 23 U/L (ref 0–37)
Albumin: 4.7 g/dL (ref 3.5–5.2)
BUN: 20 mg/dL (ref 6–23)
CO2: 28 mEq/L (ref 19–32)
Calcium: 10.3 mg/dL (ref 8.4–10.5)
Chloride: 101 mEq/L (ref 96–112)
Creatinine, Ser: 0.81 mg/dL (ref 0.40–1.20)
GFR: 74.12 mL/min (ref >60.00)
GLUCOSE: 139 mg/dL — AB (ref 70–99)
POTASSIUM: 4.3 meq/L (ref 3.5–5.1)
Sodium: 137 mEq/L (ref 135–145)
TOTAL PROTEIN: 7.7 g/dL (ref 6.0–8.3)
Total Bilirubin: 0.4 mg/dL (ref 0.2–1.2)

## 2016-05-18 LAB — LIPID PANEL
CHOLESTEROL: 189 mg/dL (ref 0–200)
HDL: 43.7 mg/dL (ref >39.00)
NonHDL: 145.47
Total CHOL/HDL Ratio: 4
Triglycerides: 275 mg/dL — ABNORMAL HIGH (ref 0.0–149.0)
VLDL: 55 mg/dL — AB (ref 0.0–40.0)

## 2016-05-18 LAB — LDL CHOLESTEROL, DIRECT: LDL DIRECT: 101 mg/dL

## 2016-05-18 LAB — HEMOGLOBIN A1C: HEMOGLOBIN A1C: 8.4 % — AB (ref 4.6–6.5)

## 2016-05-18 MED ORDER — HYDROCHLOROTHIAZIDE 25 MG PO TABS: 25.0000 mg | ORAL_TABLET | Freq: Every day | ORAL | 1 refills | Status: DC

## 2016-05-18 MED ORDER — LISINOPRIL 5 MG PO TABS: 5.0000 mg | ORAL_TABLET | Freq: Every day | ORAL | 1 refills | Status: DC

## 2016-05-18 MED ORDER — GLUCOSE BLOOD VI STRP
ORAL_STRIP | 5 refills | Status: DC
Start: 2016-05-18 — End: 2018-03-18

## 2016-05-18 MED ORDER — PROMETHAZINE HCL 25 MG PO TABS: ORAL_TABLET | ORAL | 0 refills | Status: DC

## 2016-05-18 MED ORDER — GABAPENTIN 300 MG PO CAPS: 300.0000 mg | ORAL_CAPSULE | Freq: Three times a day (TID) | ORAL | 1 refills | Status: DC

## 2016-05-18 MED ORDER — ZOSTER VAC RECOMB ADJUVANTED 50 MCG/0.5ML IM SUSR: 1.0000 mL | Freq: Once | INTRAMUSCULAR | 1 refills | Status: AC

## 2016-05-18 MED ORDER — FENOFIBRATE MICRONIZED 130 MG PO CAPS: 130.0000 mg | ORAL_CAPSULE | Freq: Every day | ORAL | 1 refills | Status: DC

## 2016-05-18 MED ORDER — GLIPIZIDE 10 MG PO TABS: 20.0000 mg | ORAL_TABLET | Freq: Two times a day (BID) | ORAL | 1 refills | Status: DC

## 2016-05-18 MED ORDER — ONETOUCH ULTRASOFT LANCETS MISC: 5 refills | Status: DC

## 2016-05-18 MED ORDER — POTASSIUM CHLORIDE CRYS ER 20 MEQ PO TBCR: 20.0000 meq | EXTENDED_RELEASE_TABLET | Freq: Every day | ORAL | 1 refills | Status: DC

## 2016-05-18 MED ORDER — SIMVASTATIN 40 MG PO TABS: 40.0000 mg | ORAL_TABLET | Freq: Every day | ORAL | 1 refills | Status: DC

## 2016-05-18 NOTE — Progress Notes (Signed)
Patient ID: Sandra Ray, female   DOB: 1945-06-20, 71 y.o.   MRN: [id number]     Subjective:  I acted as a Education administrator for Dr. Carollee Herter.  Guerry Bruin, [place]   Patient ID: Sandra Ray, female    DOB: 1946-01-12, 71 y.o.   MRN: [id number]  Chief Complaint  Patient presents with  . Hyperlipidemia  . Hypertension  . Diabetes    Hyperlipidemia  This is a chronic problem. Pertinent negatives include no chest pain or shortness of breath. Current antihyperlipidemic treatment includes fibric acid derivatives. There are no compliance problems.   Hypertension  This is a chronic problem. The problem is controlled. Pertinent negatives include no blurred vision, chest pain, headaches, malaise/fatigue, palpitations or shortness of breath. Past treatments include ACE inhibitors. There are no compliance problems.   Diabetes  She presents for her follow-up diabetic visit. She has type 2 diabetes mellitus. Her disease course has been stable. Pertinent negatives for hypoglycemia include no headaches. Pertinent negatives for diabetes include no blurred vision and no chest pain. There are no hypoglycemic complications. Diabetic complications include peripheral neuropathy. Risk factors for coronary artery disease include hypertension, obesity, post-menopausal, dyslipidemia and diabetes mellitus. Current diabetic treatment includes oral agent (triple therapy). She is compliant with treatment all of the time. Her weight is stable. Her breakfast blood glucose is taken between 8-9 am. Her breakfast blood glucose range is generally 130-140 mg/dl. An ACE inhibitor/angiotensin II receptor blocker is being taken.   Patient is in today for cholesterol, diabetes, and blood pressure.  Patient Care Team: Ann Held, DO as PCP - General (Family Medicine) Myeong Roxine Caddy, DPM as Consulting Physician (Podiatry) My Margret Chance, [place] as Referring Physician (Optometry)   Past Medical History:  Diagnosis Date  . Arthritis   .  Breast abscess    right breast  . Carpal tunnel syndrome   . Degenerative disc disease   . Diabetes mellitus type II   . Eczema of hand   . Hx of colonic polyps 12/02/2014   2004 - 2 diminutive polyps   . Hyperlipidemia   . Hypertension   . Metatarsal deformity   . Shingles   . Tubular adenoma of colon     Past Surgical History:  Procedure Laterality Date  . ABDOMINAL HYSTERECTOMY  1981  . ABSCESS DRAINAGE Right    breast  . APPENDECTOMY  1966  . carpel tunnel Right   . CESAREAN SECTION     x2  . COLONOSCOPY    . LUMBAR LAMINECTOMY     [id number]. 2011 L4,L5,S1  . Right rotator cuff repair 09/2015    . TUBAL LIGATION      Family History  Problem Relation Age of Onset  . Hypertension Father   . Diabetes Mother   . Stroke Mother   . Hypertension Mother   . High Cholesterol Mother   . Heart disease Mother   . Diabetes Brother   . Breast cancer Daughter   . Breast cancer Maternal Aunt   . Breast cancer Sister   . Colon polyps Brother     Social History   Social History  . Marital status: Married    Spouse name: N/A  . Number of children: 2  . Years of education: N/A   Occupational History  . Not on file.   Social History Main Topics  . Smoking status: Never Smoker  . Smokeless tobacco: Never Used  . Alcohol use No  . Drug use: No  .  Sexual activity: Not Currently   Other Topics Concern  . Not on file   Social History Narrative   Married 2 daughters   Not working outside home   no caffeine   12/02/2014       Outpatient Medications Prior to Visit  Medication Sig Dispense Refill  . acetaminophen (TYLENOL) 650 MG CR tablet Take 1,300 mg by mouth daily.     Marland Kitchen albuterol (VENTOLIN HFA) 108 (90 BASE) MCG/ACT inhaler Inhale 2 puffs into the lungs every 6 (six) hours as needed.      . Ascorbic Acid (VITAMIN C) 500 MG tablet Take 500 mg by mouth daily.      Marland Kitchen aspirin 81 MG tablet Take 81 mg by mouth daily.      . Cholecalciferol (VITAMIN D-3) 5000 UNITS  TABS Take by mouth daily.     . Coenzyme Q10 100 MG TABS Take 400 mg by mouth daily.    . dapagliflozin propanediol (FARXIGA) 10 MG TABS tablet Take 10 mg by mouth daily. 90 tablet 4  . Garlic 6333 MG CAPS Take 1 capsule by mouth daily.     . JENTADUETO 2.07-998 MG TABS Take 1 tablet by mouth 2 (two) times daily. 180 tablet 1  . vitamin E 400 UNIT capsule Take 400 Units by mouth daily.      . fenofibrate micronized (ANTARA) 130 MG capsule TAKE ONE CAPSULE BY MOUTH ONCE DAILY 90 capsule 1  . gabapentin (NEURONTIN) 300 MG capsule TAKE ONE CAPSULE BY MOUTH THREE TIMES DAILY 270 capsule 1  . glipiZIDE (GLUCOTROL) 10 MG tablet Take 2 tablets (20 mg total) by mouth 2 (two) times daily. 120 tablet 5  . hydrochlorothiazide (HYDRODIURIL) 25 MG tablet TAKE ONE TABLET BY MOUTH ONCE DAILY 90 tablet 1  . KLOR-CON M20 20 MEQ tablet TAKE ONE TABLET BY MOUTH ONCE DAILY 90 tablet 1  . lisinopril (PRINIVIL,ZESTRIL) 5 MG tablet TAKE ONE TABLET BY MOUTH ONCE DAILY 90 tablet 1  . promethazine (PHENERGAN) 25 MG tablet TAKE ONE TABLET BY MOUTH 4 TIMES DAILY AS NEEDED 90 tablet 0  . simvastatin (ZOCOR) 40 MG tablet TAKE ONE TABLET BY MOUTH AT BEDTIME 90 tablet 1  . nateglinide (STARLIX) 60 MG tablet TAKE 1 TABLET (60 MG TOTAL) BY MOUTH 3 (THREE) TIMES DAILY WITH MEALS. (Patient not taking: Reported on 03/28/2016) 90 tablet 0   No facility-administered medications prior to visit.     Allergies  Allergen Reactions  . Erythromycin     Passed out while taking    Review of Systems  Constitutional: Negative for fever and malaise/fatigue.  HENT: Negative for congestion.   Eyes: Negative for blurred vision.  Respiratory: Negative for cough and shortness of breath.   Cardiovascular: Negative for chest pain, palpitations and leg swelling.  Gastrointestinal: Negative for vomiting.  Musculoskeletal: Negative for back pain.  Skin: Negative for rash.  Neurological: Negative for loss of consciousness and headaches.        Objective:    Physical Exam  Constitutional: She is oriented to person, place, and time. She appears well-developed and well-nourished. No distress.  HENT:  Head: Normocephalic and atraumatic.  Eyes: Conjunctivae and EOM are normal.  Neck: Normal range of motion. Neck supple. No JVD present. Carotid bruit is not present. No thyromegaly present.  Cardiovascular: Normal rate, regular rhythm and normal heart sounds.   No murmur heard. Pulmonary/Chest: Effort normal and breath sounds normal. No respiratory distress. She has no wheezes. She has no rales. She  exhibits no tenderness.  Abdominal: Soft. Bowel sounds are normal. There is no tenderness.  Musculoskeletal: Normal range of motion. She exhibits no edema or deformity.  Neurological: She is alert and oriented to person, place, and time.  Skin: Skin is warm and dry. She is not diaphoretic.  Psychiatric: She has a normal mood and affect. Her behavior is normal. Judgment and thought content normal.  Nursing note and vitals reviewed. monofilament-- L foot no feeling ,  R foot--no feeling in big toe  BP 132/68 (BP Location: Left Arm, Cuff Size: Large)   Pulse 77   Temp 97.4 F (36.3 C) (Oral)   Resp 16   Ht 5\' 8"  (1.727 m)   Wt 203 lb 12.8 oz (92.4 kg)   SpO2 96%   BMI 30.99 kg/m  Wt Readings from Last 3 Encounters:  05/18/16 203 lb 12.8 oz (92.4 kg)  03/28/16 207 lb 8 oz (94.1 kg)  11/18/15 205 lb (93 kg)     Lab Results  Component Value Date   WBC 8.5 05/18/2015   HGB 14.2 05/18/2015   HCT 42.3 05/18/2015   PLT 294.0 05/18/2015   GLUCOSE 139 (H) 05/18/2016   CHOL 189 05/18/2016   TRIG 275.0 (H) 05/18/2016   HDL 43.70 05/18/2016   LDLDIRECT 101.0 05/18/2016   LDLCALC 81 05/22/2014   ALT 21 05/18/2016   AST 23 05/18/2016   NA 137 05/18/2016   K 4.3 05/18/2016   CL 101 05/18/2016   CREATININE 0.81 05/18/2016   BUN 20 05/18/2016   CO2 28 05/18/2016   TSH 0.59 10/22/2014   INR 1.19 10/28/2009   HGBA1C 8.4 (H)  05/18/2016   MICROALBUR 0.7 03/28/2010    Lab Results  Component Value Date   TSH 0.59 10/22/2014   Lab Results  Component Value Date   WBC 8.5 05/18/2015   HGB 14.2 05/18/2015   HCT 42.3 05/18/2015   MCV 87.0 05/18/2015   PLT 294.0 05/18/2015   Lab Results  Component Value Date   NA 137 05/18/2016   K 4.3 05/18/2016   CO2 28 05/18/2016   GLUCOSE 139 (H) 05/18/2016   BUN 20 05/18/2016   CREATININE 0.81 05/18/2016   BILITOT 0.4 05/18/2016   ALKPHOS 43 05/18/2016   AST 23 05/18/2016   ALT 21 05/18/2016   PROT 7.7 05/18/2016   ALBUMIN 4.7 05/18/2016   CALCIUM 10.3 05/18/2016   GFR 74.12 05/18/2016   Lab Results  Component Value Date   CHOL 189 05/18/2016   Lab Results  Component Value Date   HDL 43.70 05/18/2016   Lab Results  Component Value Date   LDLCALC 81 05/22/2014   Lab Results  Component Value Date   TRIG 275.0 (H) 05/18/2016   Lab Results  Component Value Date   CHOLHDL 4 05/18/2016   Lab Results  Component Value Date   HGBA1C 8.4 (H) 05/18/2016       Assessment & Plan:   Problem List Items Addressed This Visit      Unprioritized   Hyperlipidemia LDL goal <70   Relevant Medications   fenofibrate micronized (ANTARA) 130 MG capsule   lisinopril (PRINIVIL,ZESTRIL) 5 MG tablet   hydrochlorothiazide (HYDRODIURIL) 25 MG tablet   simvastatin (ZOCOR) 40 MG tablet   Other Relevant Orders   Lipid panel (Completed)   LDL cholesterol, direct (Completed)    Other Visit Diagnoses    Need for shingles vaccine    -  Primary   DM (diabetes mellitus) type II  uncontrolled, periph vascular disorder (HCC)       Relevant Medications   glucose blood (ONE TOUCH ULTRA TEST) test strip   Lancets (ONETOUCH ULTRASOFT) lancets   glipiZIDE (GLUCOTROL) 10 MG tablet   fenofibrate micronized (ANTARA) 130 MG capsule   lisinopril (PRINIVIL,ZESTRIL) 5 MG tablet   hydrochlorothiazide (HYDRODIURIL) 25 MG tablet   simvastatin (ZOCOR) 40 MG tablet   Other Relevant  Orders   Hemoglobin A1c (Completed)   Type II diabetes mellitus with peripheral artery disease (HCC)       Relevant Medications   glipiZIDE (GLUCOTROL) 10 MG tablet   fenofibrate micronized (ANTARA) 130 MG capsule   gabapentin (NEURONTIN) 300 MG capsule   lisinopril (PRINIVIL,ZESTRIL) 5 MG tablet   hydrochlorothiazide (HYDRODIURIL) 25 MG tablet   simvastatin (ZOCOR) 40 MG tablet   Essential hypertension, benign       Relevant Medications   fenofibrate micronized (ANTARA) 130 MG capsule   lisinopril (PRINIVIL,ZESTRIL) 5 MG tablet   hydrochlorothiazide (HYDRODIURIL) 25 MG tablet   simvastatin (ZOCOR) 40 MG tablet   Other Relevant Orders   Comprehensive metabolic panel (Completed)   Nausea       Relevant Medications   promethazine (PHENERGAN) 25 MG tablet   Hypokalemia       Relevant Medications   potassium chloride SA (KLOR-CON M20) 20 MEQ tablet      I have discontinued Ms. Eugenio's nateglinide. I have changed her KLOR-CON M20 to potassium chloride SA. I have also changed her fenofibrate micronized, gabapentin, lisinopril, hydrochlorothiazide, and simvastatin. Additionally, I am having her start on glucose blood, onetouch ultrasoft, and Zoster Vac Recomb Adjuvanted. Lastly, I am having her maintain her aspirin, acetaminophen, albuterol, vitamin C, vitamin E, Vitamin D-3, Garlic, Coenzyme J88, dapagliflozin propanediol, JENTADUETO, glipiZIDE, and promethazine.  Meds ordered this encounter  Medications  . glucose blood (ONE TOUCH ULTRA TEST) test strip    Sig: Use as instructed once a day.  DX code E11.40    Dispense:  100 each    Refill:  5  . Lancets (ONETOUCH ULTRASOFT) lancets    Sig: Use as instructed once day.  E11.40    Dispense:  100 each    Refill:  5  . glipiZIDE (GLUCOTROL) 10 MG tablet    Sig: Take 2 tablets (20 mg total) by mouth 2 (two) times daily.    Dispense:  360 tablet    Refill:  1  . fenofibrate micronized (ANTARA) 130 MG capsule    Sig: Take 1 capsule (130  mg total) by mouth daily.    Dispense:  90 capsule    Refill:  1  . gabapentin (NEURONTIN) 300 MG capsule    Sig: Take 1 capsule (300 mg total) by mouth 3 (three) times daily.    Dispense:  270 capsule    Refill:  1  . potassium chloride SA (KLOR-CON M20) 20 MEQ tablet    Sig: Take 1 tablet (20 mEq total) by mouth daily.    Dispense:  90 tablet    Refill:  1  . lisinopril (PRINIVIL,ZESTRIL) 5 MG tablet    Sig: Take 1 tablet (5 mg total) by mouth daily.    Dispense:  90 tablet    Refill:  1  . hydrochlorothiazide (HYDRODIURIL) 25 MG tablet    Sig: Take 1 tablet (25 mg total) by mouth daily.    Dispense:  90 tablet    Refill:  1  . promethazine (PHENERGAN) 25 MG tablet    Sig: TAKE ONE  TABLET BY MOUTH 4 TIMES DAILY AS NEEDED    Dispense:  90 tablet    Refill:  0  . simvastatin (ZOCOR) 40 MG tablet    Sig: Take 1 tablet (40 mg total) by mouth at bedtime.    Dispense:  90 tablet    Refill:  1  . Zoster Vac Recomb Adjuvanted (SHINGRIX) 50 MCG SUSR    Sig: Inject 1 mL into the muscle once.    Dispense:  1 each    Refill:  1    CMA served as scribe during this visit. History, Physical and Plan performed by medical provider. Documentation and orders reviewed and attested to.  Ann Held, DO

## 2016-05-18 NOTE — Patient Instructions (Signed)
Carbohydrate Counting for Diabetes Mellitus, Adult Carbohydrate counting is a method for keeping track of how many carbohydrates you eat. Eating carbohydrates naturally increases the amount of sugar (glucose) in the blood. Counting how many carbohydrates you eat helps keep your blood glucose within normal limits, which helps you manage your diabetes (diabetes mellitus). It is important to know how many carbohydrates you can safely have in each meal. This is different for every person. A diet and nutrition specialist (registered dietitian) can help you make a meal plan and calculate how many carbohydrates you should have at each meal and snack. Carbohydrates are found in the following foods:  Grains, such as breads and cereals.  Dried beans and soy products.  Starchy vegetables, such as potatoes, peas, and corn.  Fruit and fruit juices.  Milk and yogurt.  Sweets and snack foods, such as cake, cookies, candy, chips, and soft drinks. How do I count carbohydrates? There are two ways to count carbohydrates in food. You can use either of the methods or a combination of both. Reading "Nutrition Facts" on packaged food  The "Nutrition Facts" list is included on the labels of almost all packaged foods and beverages in the [place] It includes:  The serving size.  Information about nutrients in each serving, including the grams (g) of carbohydrate per serving. To use the "Nutrition Facts":  Decide how many servings you will have.  Multiply the number of servings by the number of carbohydrates per serving.  The resulting number is the total amount of carbohydrates that you will be having. Learning standard serving sizes of other foods  When you eat foods containing carbohydrates that are not packaged or do not include "Nutrition Facts" on the label, you need to measure the servings in order to count the amount of carbohydrates:  Measure the foods that you will eat with a food scale or measuring  cup, if needed.  Decide how many standard-size servings you will eat.  Multiply the number of servings by 15. Most carbohydrate-rich foods have about 15 g of carbohydrates per serving.  For example, if you eat 8 oz (170 g) of strawberries, you will have eaten 2 servings and 30 g of carbohydrates (2 servings x 15 g = 30 g).  For foods that have more than one food mixed, such as soups and casseroles, you must count the carbohydrates in each food that is included. The following list contains standard serving sizes of common carbohydrate-rich foods. Each of these servings has about 15 g of carbohydrates:   hamburger bun or  English muffin.   oz (15 mL) syrup.   oz (14 g) jelly.  1 slice of bread.  1 six-inch tortilla.  3 oz (85 g) cooked rice or pasta.  4 oz (113 g) cooked dried beans.  4 oz (113 g) starchy vegetable, such as peas, corn, or potatoes.  4 oz (113 g) hot cereal.  4 oz (113 g) mashed potatoes or  of a large baked potato.  4 oz (113 g) canned or frozen fruit.  4 oz (120 mL) fruit juice.  4-6 crackers.  6 chicken nuggets.  6 oz (170 g) unsweetened dry cereal.  6 oz (170 g) plain fat-free yogurt or yogurt sweetened with artificial sweeteners.  8 oz (240 mL) milk.  8 oz (170 g) fresh fruit or one small piece of fruit.  24 oz (680 g) popped popcorn. Example of carbohydrate counting Sample meal  3 oz (85 g) chicken breast.  6 oz (  170 g) brown rice.  4 oz (113 g) corn.  8 oz (240 mL) milk.  8 oz (170 g) strawberries with sugar-free whipped topping. Carbohydrate calculation 1. Identify the foods that contain carbohydrates:  Rice.  Corn.  Milk.  Strawberries. 2. Calculate how many servings you have of each food:  2 servings rice.  1 serving corn.  1 serving milk.  1 serving strawberries. 3. Multiply each number of servings by 15 g:  2 servings rice x 15 g = 30 g.  1 serving corn x 15 g = 15 g.  1 serving milk x 15 g = 15  g.  1 serving strawberries x 15 g = 15 g. 4. Add together all of the amounts to find the total grams of carbohydrates eaten:  30 g + 15 g + 15 g + 15 g = 75 g of carbohydrates total. This information is not intended to replace advice given to you by your health care provider. Make sure you discuss any questions you have with your health care provider. Document Released: 02/27/2005 Document Revised: 09/17/2015 Document Reviewed: 08/11/2015 Elsevier Interactive Patient Education  2017 [employer]  

## 2016-05-18 NOTE — Progress Notes (Signed)
Pre visit review using our clinic review tool, if applicable. No additional management support is needed unless otherwise documented below in the visit note. 

## 2016-05-21 NOTE — Assessment & Plan Note (Signed)
Check labs con't fenofibrate and zocor

## 2016-05-21 NOTE — Assessment & Plan Note (Signed)
Stable con't lisinopril and hctz

## 2016-05-22 ENCOUNTER — Other Ambulatory Visit: Payer: Self-pay | Admitting: Family Medicine

## 2016-05-22 DIAGNOSIS — E119 Type 2 diabetes mellitus without complications: Secondary | ICD-10-CM

## 2016-05-22 DIAGNOSIS — E785 Hyperlipidemia, unspecified: Secondary | ICD-10-CM

## 2016-05-22 MED ORDER — ATORVASTATIN CALCIUM 20 MG PO TABS: 20.0000 mg | ORAL_TABLET | Freq: Every day | ORAL | 0 refills | Status: DC

## 2016-06-05 ENCOUNTER — Other Ambulatory Visit: Payer: Self-pay | Admitting: *Deleted

## 2016-06-05 DIAGNOSIS — E1151 Type 2 diabetes mellitus with diabetic peripheral angiopathy without gangrene: Secondary | ICD-10-CM

## 2016-06-05 MED ORDER — JENTADUETO 2.5-1000 MG PO TABS: 1.0000 | ORAL_TABLET | Freq: Two times a day (BID) | ORAL | 1 refills | Status: DC

## 2016-06-05 NOTE — Telephone Encounter (Signed)
Rx needs to be mailed to [facility], [address] [place], [place] [address]

## 2016-06-13 NOTE — Progress Notes (Signed)
Patient ID: Sandra Ray, female   DOB: 1945-07-16, 71 y.o.   MRN: [id number]            Reason for Appointment: Consultation for Type 2 Diabetes  Referring physician: Etter Sjogren   History of Present Illness:          Date of diagnosis of type 2 diabetes mellitus: 1996       Background history:   She had a high blood sugar on lab work which diagnosed her diabetes and she was most likely treated with metformin for a few years She has also taking glipizide for several years She was tried on Actos which was started because of significant weight gain. Over the last few years she has been taking either  Januvia or Tradjenta with her metformin in combination with fair control Her A1c has been below 8% only twice in the last 2-3 years  Recent history:   She is being referred here for worsening diabetes control and progressively higher A1c over the last year Most recent A1c was 8.4%  Non-insulin hypoglycemic drugs the patient is taking are: Glipizide 10 mg at lunch and bedtime, Farxiga 10 mg in the morning, Jentadueto 2.5 mg twice a day  Current management, blood sugar patterns and problems identified:  She is checking her blood sugars only in the mornings with a One Touch meter and her blood sugars are generally in the low 100+ range especially in the last month or so and only occasionally higher, highest reading in the last 2 months or so has been about 180 fasting  She says she is trying to be as active as possible but limited because of her back pain and neuropathy     She is trying to eliminate high-fat food usually and mostly eating smaller meals and few snacks.  However her weight has leveled off for the last few months       Side effects from medications have been: Weight gain from Actos, nausea from high dose metformin  Compliance with the medical regimen: Good Hypoglycemia:   none  Glucose monitoring:  done  times a day         Glucometer: One Touch.      Blood Glucose  readings by time of day and averages from meter download:  PREMEAL Breakfast Lunch Dinner Bedtime  Overall   Glucose range: 118-169      Median:        Self-care: The diet that the patient has been following is: tries to limit fried food.     Typical meal intake: Breakfast is Toast and banana, lunch [place] burger and salad, dinner is soup and crackers and vegetables.  Snacks are usually small apple               Dietician visit, most recent: None               Exercise:  some walking  Weight history:200-254  Wt Readings from Last 3 Encounters:  06/14/16 206 lb (93.4 kg)  05/18/16 203 lb 12.8 oz (92.4 kg)  03/28/16 207 lb 8 oz (94.1 kg)    Glycemic control:   Lab Results  Component Value Date   HGBA1C 8.4 (H) 05/18/2016   HGBA1C 8.2 (H) 11/18/2015   HGBA1C 7.5 (H) 05/18/2015   Lab Results  Component Value Date   MICROALBUR 0.7 03/28/2010   LDLCALC 81 05/22/2014   CREATININE 0.81 05/18/2016   Lab Results  Component Value Date   MICRALBCREAT 0.5 03/28/2010  No results found for: FRUCTOSAMINE    Allergies as of 06/14/2016      Reactions   Erythromycin    Passed out while taking      Medication List       Accurate as of 06/14/16  4:55 PM. Always use your most recent med list.          acetaminophen 650 MG CR tablet Commonly known as:  TYLENOL Take 1,300 mg by mouth daily.   aspirin 81 MG tablet Take 81 mg by mouth daily.   atorvastatin 20 MG tablet Commonly known as:  LIPITOR Take 1 tablet (20 mg total) by mouth daily.   Coenzyme Q10 100 MG Tabs Take 400 mg by mouth daily.   dapagliflozin propanediol 10 MG Tabs tablet Commonly known as:  FARXIGA Take 10 mg by mouth daily.   fenofibrate micronized 130 MG capsule Commonly known as:  ANTARA Take 1 capsule (130 mg total) by mouth daily.   gabapentin 300 MG capsule Commonly known as:  NEURONTIN Take 1 capsule (300 mg total) by mouth 3 (three) times daily.   Garlic 4097 MG Caps Take 1 capsule by  mouth daily.   glipiZIDE 10 MG 24 hr tablet Commonly known as:  GLIPIZIDE XL Take 1 tablet (10 mg total) by mouth daily with breakfast.   glucose blood test strip Commonly known as:  ONE TOUCH ULTRA TEST Use as instructed once a day.  DX code E11.40   hydrochlorothiazide 25 MG tablet Commonly known as:  HYDRODIURIL Take 1 tablet (25 mg total) by mouth daily.   JENTADUETO 2.07-998 MG Tabs Generic drug:  Linagliptin-Metformin HCl Take 1 tablet by mouth 2 (two) times daily.   lisinopril 5 MG tablet Commonly known as:  PRINIVIL,ZESTRIL Take 1 tablet (5 mg total) by mouth daily.   onetouch ultrasoft lancets Use as instructed once day.  E11.40   potassium chloride SA 20 MEQ tablet Commonly known as:  KLOR-CON M20 Take 1 tablet (20 mEq total) by mouth daily.   promethazine 25 MG tablet Commonly known as:  PHENERGAN TAKE ONE TABLET BY MOUTH 4 TIMES DAILY AS NEEDED   VENTOLIN HFA 108 (90 Base) MCG/ACT inhaler Generic drug:  albuterol Inhale 2 puffs into the lungs every 6 (six) hours as needed.   vitamin C 500 MG tablet Commonly known as:  ASCORBIC ACID Take 500 mg by mouth daily.   Vitamin D-3 5000 units Tabs Take by mouth daily.   vitamin E 400 UNIT capsule Take 400 Units by mouth daily.       Allergies:  Allergies  Allergen Reactions  . Erythromycin     Passed out while taking    Past Medical History:  Diagnosis Date  . Arthritis   . Breast abscess    right breast  . Carpal tunnel syndrome   . Degenerative disc disease   . Diabetes mellitus type II   . Eczema of hand   . Hx of colonic polyps 12/02/2014   2004 - 2 diminutive polyps   . Hyperlipidemia   . Hypertension   . Metatarsal deformity   . Shingles   . Tubular adenoma of colon     Past Surgical History:  Procedure Laterality Date  . ABDOMINAL HYSTERECTOMY  1981  . ABSCESS DRAINAGE Right    breast  . APPENDECTOMY  1966  . carpel tunnel Right   . CESAREAN SECTION     x2  . COLONOSCOPY      . LUMBAR LAMINECTOMY  1761,6073. 2011 L4,L5,S1  . Right rotator cuff repair 09/2015    . TUBAL LIGATION      Family History  Problem Relation Age of Onset  . Hypertension Father   . Diabetes Mother   . Stroke Mother   . Hypertension Mother   . High Cholesterol Mother   . Heart disease Mother   . Diabetes Brother   . Breast cancer Daughter   . Breast cancer Maternal Aunt   . Breast cancer Sister   . Colon polyps Brother     Social History:  reports that she has never smoked. She has never used smokeless tobacco. She reports that she does not drink alcohol or use drugs.   Review of Systems  Constitutional: Negative for weight loss and reduced appetite.  HENT: Negative for headaches.   Eyes: Negative for blurred vision.  Respiratory: Negative for shortness of breath.   Cardiovascular: Negative for chest pain and leg swelling.  Gastrointestinal: Negative for nausea.  Endocrine: Negative for general weakness and cold intolerance.  Musculoskeletal: Positive for back pain.  Skin: Negative for rash.  Neurological: Positive for weakness and numbness.       Has numbness in feet for a few years, has minimal pain, takes gabapentin also. She thinks she has some residual weakness in her legs after her multiple back surgeries  Psychiatric/Behavioral: Negative for insomnia.     Lipid history: Recently started Lipitor instead of Zocor and is also on fenofibrate    Lab Results  Component Value Date   CHOL 189 05/18/2016   HDL 43.70 05/18/2016   LDLCALC 81 05/22/2014   LDLDIRECT 101.0 05/18/2016   TRIG 275.0 (H) 05/18/2016   CHOLHDL 4 05/18/2016           Hypertension:Mild and controlled with 5 mg glipizide and 25 mg HCTZ prescribed by her PCP  Most recent eye exam was 04/2016  Most recent foot exam:06/2016    LABS:  Office Visit on 06/14/2016  Component Date Value Ref Range Status  . POC Glucose 06/14/2016 128* 70 - 99 mg/dl Final    Physical Examination:  BP  122/60   Pulse 91   Ht 5\' 8"  (1.727 m)   Wt 206 lb (93.4 kg)   SpO2 92%   BMI 31.32 kg/m   GENERAL:         Patient has generalized obesity.   HEENT:         Eye exam shows normal external appearance. Fundus exam shows no retinopathy.  Oral exam shows normal mucosa .  NECK:   There is no lymphadenopathy Thyroid is not enlarged and no nodules felt.  Carotids are normal to palpation and no bruit heard LUNGS:         Chest is symmetrical. Lungs are clear to auscultation.Marland Kitchen   HEART:         Heart sounds:  S1 and S2 are normal. No murmur or click heard., no S3 or S4.   ABDOMEN:   There is no distention present. Liver and spleen are not palpable. No other mass or tenderness present.   NEUROLOGICAL:   Ankle jerks are absent bilaterally.    Diabetic Foot Exam - Simple   Simple Foot Form Diabetic Foot exam was performed with the following findings:  Yes   Visual Inspection No deformities, no ulcerations, no other skin breakdown bilaterally:  Yes Sensation Testing See comments:  Yes Pulse Check Posterior Tibialis and Dorsalis pulse intact bilaterally:  Yes See comments:  Yes Comments  Monofilament sensation absent in the toes except right fifth toe Posterior tibialis pulses were not palpable            Vibration sense is Absent in her toes MUSCULOSKELETAL:  There is no swelling or deformity of the peripheral joints. Spine is normal to inspection.   EXTREMITIES:     There is no edema. No skin lesions present.Marland Kitchen SKIN:       No rash or lesions of concern.        ASSESSMENT:  Diabetes type 2, uncontrolled   She has had long-standing diabetes with obesity She has had an A1c over 8% since at least 6 months ago This is with a regimen of metformin, glipizide, Farxiga and Tradjenta  Appears to be doing very well with controlling portions and high Either high fat meals She is trying to be active but is limited by her back pain Currently has a BMI of 31 and is unable to lose any  weight  Complications of diabetes: Peripheral neuropathy with sensory loss, needs assessment of urinary microalbumin  Hyperlipidemia with increased triglycerides  PLAN:     She likely has postprandial hyperglycemia and needs to start checking blood sugars AFTER meals as discussed today  She will bring her monitor for download  Discussed blood sugar targets at various times  In order to help her long-term controlled and postprandial hyperglycemia she should be on a GLP-1 drug.  However she has significant financial limitations and may not be able to afford these medications  She was explained the nature of GLP-1 drugs, how they are administered, options available and possible side effects.  She will be shown how to inject the [facility] and also the Trulicity pens today by the nurse educator  She will call us to see which of these 2 medications is affordable  Also meanwhile she needs to switch from glipizide to glipizide ER for more consistent 24-hour control with 10 mg once a day  Will also consider switching her Jentadueto to metformin ER once she is starting a GLP-1 drugs to minimize or nausea  She will see the dietitian for meal planning  Encourage her to be as active as possible  Follow-up in 1 month and review her blood sugars from her download  Check urine microalbumin on the next visit  Needs proper foot care and preventive measures because of her sensory loss   Patient Instructions  Check blood sugars on waking up  2-3 x weekly  Also check blood sugars about 2 hours after a meal and do this after different meals by rotation  Recommended blood sugar levels on waking up is 90-130 and about 2 hours after meal is 130-160  Please bring your blood sugar monitor to each visit, thank you   Check cost of BYDUREON from [place] [facility] and TRULICITY from [facility]     Consultation note has been sent to the referring physician . Counseling time on subjects discussed above is  over 50% of today's 60 minute visit  Altie Savard 06/14/2016, 4:55 PM   Note: This office note was prepared with Estate agent. Any transcriptional errors that result from this process are unintentional.

## 2016-06-14 ENCOUNTER — Ambulatory Visit (INDEPENDENT_AMBULATORY_CARE_PROVIDER_SITE_OTHER): Payer: Medicare Other | Admitting: Endocrinology

## 2016-06-14 ENCOUNTER — Encounter: Payer: Self-pay | Admitting: Endocrinology

## 2016-06-14 VITALS — BP 122/60 | HR 91 | Ht 68.0 in | Wt 206.0 lb

## 2016-06-14 DIAGNOSIS — E1142 Type 2 diabetes mellitus with diabetic polyneuropathy: Secondary | ICD-10-CM | POA: Diagnosis not present

## 2016-06-14 DIAGNOSIS — E1165 Type 2 diabetes mellitus with hyperglycemia: Secondary | ICD-10-CM | POA: Diagnosis not present

## 2016-06-14 LAB — GLUCOSE, POCT (MANUAL RESULT ENTRY): POC GLUCOSE: 128 mg/dL — AB (ref 70–99)

## 2016-06-14 MED ORDER — GLIPIZIDE ER 10 MG PO TB24: 10.0000 mg | ORAL_TABLET | Freq: Every day | ORAL | 1 refills | Status: DC

## 2016-06-14 NOTE — Patient Instructions (Addendum)
Check blood sugars on waking up  2-3 x weekly  Also check blood sugars about 2 hours after a meal and do this after different meals by rotation  Recommended blood sugar levels on waking up is 90-130 and about 2 hours after meal is 130-160  Please bring your blood sugar monitor to each visit, thank you   Check cost of BYDUREON from [place] [facility] and TRULICITY from [facility]

## 2016-06-16 ENCOUNTER — Other Ambulatory Visit: Payer: Self-pay

## 2016-06-16 ENCOUNTER — Telehealth: Payer: Self-pay | Admitting: Endocrinology

## 2016-06-16 MED ORDER — DULAGLUTIDE 0.75 MG/0.5ML ~~LOC~~ SOAJ: 0.7500 mg | SUBCUTANEOUS | 4 refills | Status: DC

## 2016-06-16 NOTE — Telephone Encounter (Signed)
Pt called in and said that she got in touch with the insurance company about the two different medications, she said that Bydureon and Trulicity are the two they told her and that she actually prefers the Trulicity if that is okay with Dr. Dwyane Dee.  Please advise.

## 2016-06-16 NOTE — Telephone Encounter (Signed)
Please send prescription for Trulicity 9.51 mg weekly, 4 pens

## 2016-06-16 NOTE — Telephone Encounter (Signed)
This has been ordered 

## 2016-06-26 ENCOUNTER — Other Ambulatory Visit: Payer: Self-pay | Admitting: Family Medicine

## 2016-07-07 ENCOUNTER — Other Ambulatory Visit (INDEPENDENT_AMBULATORY_CARE_PROVIDER_SITE_OTHER): Payer: Medicare Other

## 2016-07-07 ENCOUNTER — Other Ambulatory Visit: Payer: Self-pay | Admitting: Endocrinology

## 2016-07-07 DIAGNOSIS — E1165 Type 2 diabetes mellitus with hyperglycemia: Secondary | ICD-10-CM | POA: Diagnosis not present

## 2016-07-07 LAB — MICROALBUMIN / CREATININE URINE RATIO
CREATININE, U: 101.6 mg/dL
MICROALB UR: 0.9 mg/dL (ref 0.0–1.9)
Microalb Creat Ratio: 0.9 mg/g (ref 0.0–30.0)

## 2016-07-07 LAB — BASIC METABOLIC PANEL
BUN: 19 mg/dL (ref 6–23)
CALCIUM: 10.2 mg/dL (ref 8.4–10.5)
CO2: 27 mEq/L (ref 19–32)
CREATININE: 0.78 mg/dL (ref 0.40–1.20)
Chloride: 100 mEq/L (ref 96–112)
GFR: 77.39 mL/min (ref >60.00)
Glucose, Bld: 119 mg/dL — ABNORMAL HIGH (ref 70–99)
Potassium: 3.9 mEq/L (ref 3.5–5.1)
Sodium: 136 mEq/L (ref 135–145)

## 2016-07-09 LAB — FRUCTOSAMINE: Fructosamine: 328 umol/L — ABNORMAL HIGH (ref 0–285)

## 2016-07-12 ENCOUNTER — Ambulatory Visit (INDEPENDENT_AMBULATORY_CARE_PROVIDER_SITE_OTHER): Payer: Medicare Other | Admitting: Endocrinology

## 2016-07-12 ENCOUNTER — Encounter: Payer: Self-pay | Admitting: Endocrinology

## 2016-07-12 VITALS — BP 140/76 | HR 79 | Ht 68.0 in | Wt 200.2 lb

## 2016-07-12 DIAGNOSIS — E1165 Type 2 diabetes mellitus with hyperglycemia: Secondary | ICD-10-CM

## 2016-07-12 DIAGNOSIS — R110 Nausea: Secondary | ICD-10-CM

## 2016-07-12 MED ORDER — PROMETHAZINE HCL 25 MG PO TABS: ORAL_TABLET | ORAL | 0 refills | Status: DC

## 2016-07-12 MED ORDER — DULAGLUTIDE 0.75 MG/0.5ML ~~LOC~~ SOAJ: 0.7500 mg | SUBCUTANEOUS | 4 refills | Status: DC

## 2016-07-12 NOTE — Patient Instructions (Signed)
When out of Jentadueto need Metformin  Check blood sugars on waking up  3x  weekly  Also check blood sugars about 2 hours after a meal and do this after different meals by rotation  Recommended blood sugar levels on waking up is 90-130 and about 2 hours after meal is 130-160  Please bring your blood sugar monitor to each visit, thank you

## 2016-07-12 NOTE — Progress Notes (Signed)
Patient ID: Sandra Ray, female   DOB: 1945-06-23, 71 y.o.   MRN: [id number]            Reason for Appointment: Follow-up for Type 2 Diabetes  Referring physician: Etter Sjogren   History of Present Illness:          Date of diagnosis of type 2 diabetes mellitus: 1996       Background history:   She had a high blood sugar on lab work which diagnosed her diabetes and she was most likely treated with metformin for a few years She has also taking glipizide for several years She was tried on Actos which was started because of significant weight gain. Over the last few years she has been taking either  Januvia or Tradjenta with her metformin in combination with fair control Her A1c has been below 8% only twice in the last 2-3 years  Recent history:   Most recent A1c was 8.4% Her fructosamine is not 328  Non-insulin hypoglycemic drugs: Glipizide ER 10 mg, Trulicity 5.02 mg weekly, Farxiga 10 mg in the morning, Jentadueto 2.5 mg twice a day  Current management, blood sugar patterns and problems identified:  Since her blood sugar was persistently poorly controlled she was started on Trulicity in 09/7410  She has had some nausea with this but it is getting better but she is still asking for some promethazine  She is checking her blood sugars only in the mornings even though she was told to start checking readings after meals consistently  She is trying to be more motivated to watch her portions and make better choices and is able to lose weight now  Overall blood sugars are better and more consistently near 100 compared to before  However previously was having mostly readings around 1 90-200 + after meals  She has not been consistent with walking       Side effects from medications have been: Weight gain from Actos, nausea from high dose metformin  Compliance with the medical regimen: Good Hypoglycemia:   none  Glucose monitoring:  done 1 times a day         Glucometer: One Touch.        Blood Glucose readings by time of day and averages from meter download:  Mean values apply above for all meters except median for One Touch  PRE-MEAL Fasting Lunch Dinner Bedtime Overall  Glucose range: 93-1 51   194     Mean/median:     128    POST-MEAL PC Breakfast PC Lunch PC Dinner  Glucose range: 262     Mean/median:       Self-care: The diet that the patient has been following is: tries to limit fried food.     Typical meal intake: Breakfast is Toast and banana, lunch [place] burger and salad, dinner is soup and crackers and vegetables.  Snacks are usually small apple               Dietician visit, most recent: Scheduled for 07/13/16               Exercise: off and on walking  Weight history:200-254  Wt Readings from Last 3 Encounters:  07/12/16 200 lb 3.2 oz (90.8 kg)  06/14/16 206 lb (93.4 kg)  05/18/16 203 lb 12.8 oz (92.4 kg)    Glycemic control:   Lab Results  Component Value Date   HGBA1C 8.4 (H) 05/18/2016   HGBA1C 8.2 (H) 11/18/2015   HGBA1C 7.5 (  H) 05/18/2015   Lab Results  Component Value Date   MICROALBUR 0.9 07/07/2016   LDLCALC 81 05/22/2014   CREATININE 0.78 07/07/2016   Lab Results  Component Value Date   MICRALBCREAT 0.9 07/07/2016    Lab Results  Component Value Date   FRUCTOSAMINE 328 (H) 07/07/2016      Allergies as of 07/12/2016      Reactions   Penicillins    Causes yeast infections   Erythromycin    Passed out while taking      Medication List       Accurate as of 07/12/16  5:06 PM. Always use your most recent med list.          acetaminophen 650 MG CR tablet Commonly known as:  TYLENOL Take 1,300 mg by mouth daily.   aspirin 81 MG tablet Take 81 mg by mouth daily.   atorvastatin 20 MG tablet Commonly known as:  LIPITOR Take 1 tablet (20 mg total) by mouth daily.   Coenzyme Q10 100 MG Tabs Take 400 mg by mouth daily.   Dulaglutide 0.75 MG/0.5ML Sopn Commonly known as:  TRULICITY Inject 0.53 mg into the skin  once a week.   FARXIGA 10 MG Tabs tablet Generic drug:  dapagliflozin propanediol TAKE ONE TABLET BY MOUTH ONCE DAILY   fenofibrate micronized 130 MG capsule Commonly known as:  ANTARA Take 1 capsule (130 mg total) by mouth daily.   gabapentin 300 MG capsule Commonly known as:  NEURONTIN Take 1 capsule (300 mg total) by mouth 3 (three) times daily.   Garlic 9767 MG Caps Take 1 capsule by mouth daily.   glipiZIDE 10 MG 24 hr tablet Commonly known as:  GLIPIZIDE XL Take 1 tablet (10 mg total) by mouth daily with breakfast.   glucose blood test strip Commonly known as:  ONE TOUCH ULTRA TEST Use as instructed once a day.  DX code E11.40   hydrochlorothiazide 25 MG tablet Commonly known as:  HYDRODIURIL Take 1 tablet (25 mg total) by mouth daily.   JENTADUETO 2.07-998 MG Tabs Generic drug:  Linagliptin-Metformin HCl Take 1 tablet by mouth 2 (two) times daily.   lisinopril 5 MG tablet Commonly known as:  PRINIVIL,ZESTRIL Take 1 tablet (5 mg total) by mouth daily.   onetouch ultrasoft lancets Use as instructed once day.  E11.40   potassium chloride SA 20 MEQ tablet Commonly known as:  KLOR-CON M20 Take 1 tablet (20 mEq total) by mouth daily.   promethazine 25 MG tablet Commonly known as:  PHENERGAN TAKE ONE TABLET BY MOUTH 2 TIMES DAILY AS NEEDED   VENTOLIN HFA 108 (90 Base) MCG/ACT inhaler Generic drug:  albuterol Inhale 2 puffs into the lungs every 6 (six) hours as needed.   vitamin C 500 MG tablet Commonly known as:  ASCORBIC ACID Take 500 mg by mouth daily.   Vitamin D-3 5000 units Tabs Take by mouth daily.   vitamin E 400 UNIT capsule Take 400 Units by mouth daily.       Allergies:  Allergies  Allergen Reactions  . Penicillins     Causes yeast infections  . Erythromycin     Passed out while taking    Past Medical History:  Diagnosis Date  . Arthritis   . Breast abscess    right breast  . Carpal tunnel syndrome   . Degenerative disc disease    . Diabetes mellitus type II   . Eczema of hand   . Hx of colonic polyps  12/02/2014   2004 - 2 diminutive polyps   . Hyperlipidemia   . Hypertension   . Metatarsal deformity   . Shingles   . Tubular adenoma of colon     Past Surgical History:  Procedure Laterality Date  . ABDOMINAL HYSTERECTOMY  1981  . ABSCESS DRAINAGE Right    breast  . APPENDECTOMY  1966  . carpel tunnel Right   . CESAREAN SECTION     x2  . COLONOSCOPY    . LUMBAR LAMINECTOMY     [id number]. 2011 L4,L5,S1  . Right rotator cuff repair 09/2015    . TUBAL LIGATION      Family History  Problem Relation Age of Onset  . Hypertension Father   . Diabetes Mother   . Stroke Mother   . Hypertension Mother   . High Cholesterol Mother   . Heart disease Mother   . Diabetes Brother   . Breast cancer Daughter   . Breast cancer Maternal Aunt   . Breast cancer Sister   . Colon polyps Brother     Social History:  reports that she has never smoked. She has never used smokeless tobacco. She reports that she does not drink alcohol or use drugs.   Review of Systems   Lipid history:  Is on combination of Lipitor and fenofibrate    Lab Results  Component Value Date   CHOL 189 05/18/2016   HDL 43.70 05/18/2016   LDLCALC 81 05/22/2014   LDLDIRECT 101.0 05/18/2016   TRIG 275.0 (H) 05/18/2016   CHOLHDL 4 05/18/2016           Hypertension:Mild and controlled with 5 mg glipizide and 25 mg HCTZ prescribed by  PCP  Most recent eye exam was 04/2016  Most recent foot exam:06/2016 Has evidence of neuropathy on exam   LABS:  Lab on 07/07/2016  Component Date Value Ref Range Status  . Microalb, Ur 07/07/2016 0.9  0.0 - 1.9 mg/dL Final  . Creatinine,U 07/07/2016 101.6  mg/dL Final  . Microalb Creat Ratio 07/07/2016 0.9  0.0 - 30.0 mg/g Final  . Sodium 07/07/2016 136  135 - 145 mEq/L Final  . Potassium 07/07/2016 3.9  3.5 - 5.1 mEq/L Final  . Chloride 07/07/2016 100  96 - 112 mEq/L Final  . CO2 07/07/2016 27   19 - 32 mEq/L Final  . Glucose, Bld 07/07/2016 119* 70 - 99 mg/dL Final  . BUN 07/07/2016 19  6 - 23 mg/dL Final  . Creatinine, Ser 07/07/2016 0.78  0.40 - 1.20 mg/dL Final  . Calcium 07/07/2016 10.2  8.4 - 10.5 mg/dL Final  . GFR 07/07/2016 77.39  >60.00 mL/min Final  . Fructosamine 07/07/2016 328* 0 - 285 umol/L Final   Comment: Published reference interval for apparently healthy subjects between age 6 and 64 is 59 - 285 umol/L and in a poorly controlled diabetic population is 228 - 563 umol/L with a mean of 396 umol/L.     Physical Examination:  BP 140/76   Pulse 79   Ht 5\' 8"  (1.727 m)   Wt 200 lb 3.2 oz (90.8 kg)   SpO2 96%   BMI 30.44 kg/m    ASSESSMENT:  Diabetes type 2, uncontrolled   See history of present illness for detailed discussion of current diabetes management, blood sugar patterns and problems identified  Although her A1c was over 8% she appears to have relatively better blood sugars now However may still have postponed hyperglycemia since her fructosamine is relatively high  at 328 Fasting readings are more near normal with using Trulicity 2.24 mg weekly and also changing glipizide to the extended release She has lost 6 pounds   PLAN:    Emphasized the need for checking blood sugars after meals  She will talk to the dietitian tomorrow about meal planning, does need to have balanced low fat small meals  She needs to walk regularly  Since she is still having mild nausea will not increase her dose of Trulicity and will continue 0.75 mg  If she starts having low normal sugars may need to reduce glipizide  A1c on the next visit  Patient Instructions  When out of Jentadueto need Metformin  Check blood sugars on waking up  3x  weekly  Also check blood sugars about 2 hours after a meal and do this after different meals by rotation  Recommended blood sugar levels on waking up is 90-130 and about 2 hours after meal is 130-160  Please bring your  blood sugar monitor to each visit, thank you      .  Numa Heatwole 07/12/2016, 5:06 PM   Note: This office note was prepared with Dragon voice recognition system technology. Any transcriptional errors that result from this process are unintentional.

## 2016-07-13 ENCOUNTER — Encounter: Payer: Medicare Other | Attending: Endocrinology | Admitting: Dietician

## 2016-07-13 ENCOUNTER — Encounter: Payer: Self-pay | Admitting: Dietician

## 2016-07-13 DIAGNOSIS — E118 Type 2 diabetes mellitus with unspecified complications: Secondary | ICD-10-CM

## 2016-07-13 DIAGNOSIS — E1165 Type 2 diabetes mellitus with hyperglycemia: Secondary | ICD-10-CM | POA: Diagnosis not present

## 2016-07-13 DIAGNOSIS — IMO0002 Reserved for concepts with insufficient information to code with codable children: Secondary | ICD-10-CM

## 2016-07-13 DIAGNOSIS — I10 Essential (primary) hypertension: Secondary | ICD-10-CM | POA: Insufficient documentation

## 2016-07-13 DIAGNOSIS — E785 Hyperlipidemia, unspecified: Secondary | ICD-10-CM | POA: Diagnosis not present

## 2016-07-13 DIAGNOSIS — E1140 Type 2 diabetes mellitus with diabetic neuropathy, unspecified: Secondary | ICD-10-CM | POA: Insufficient documentation

## 2016-07-13 DIAGNOSIS — Z713 Dietary counseling and surveillance: Secondary | ICD-10-CM | POA: Insufficient documentation

## 2016-07-13 NOTE — Progress Notes (Signed)
Diabetes Self-Management Education  Visit Type: First/Initial  Appt. Start Time: 1410 Appt. End Time: 6045  07/13/2016  Ms. Sandra Ray, identified by name and date of birth, is a 71 y.o. female with a diagnosis of Diabetes: Type 2. Other hx includes HTN, Hyperlipidemia, neuropathy, 3 back surgeries. Medications include Trulicity, Farxiga, glipizide, Jentadueto   Patient lives with her husband.  Her granddaughter and her husband are also both living there.  She cared for her mother and sister as well as her husband's mother and sister prior to them passing away.  Her husband now helps out with a lot of the chores.  They share shopping and cooking.  ASSESSMENT  Height 5' 8.5" (1.74 m), weight 198 lb (89.8 kg). There is no height or weight on file to calculate BMI.  Highest weight 254 lbs about 2 years ago.  She has been more mindful about her portions and has now lost to 198 lbs.  Continued slow weight loss. 254 lbs was her highest adult weight and 145 lbs was her lowest adult weight.      Diabetes Self-Management Education - 07/13/16 1320      Visit Information   Visit Type First/Initial     Initial Visit   Diabetes Type Type 2   Are you currently following a meal plan? No  but smaller portions and low fat   Are you taking your medications as prescribed? Yes   Date Diagnosed [place]   How would you rate your overall health? Fair     Psychosocial Assessment   Patient Belief/Attitude about Diabetes Motivated to manage diabetes   Self-care barriers None   Self-management support Doctor's office;Family   Other persons present Patient   Patient Concerns Nutrition/Meal planning;Glycemic Control   Special Needs None   Preferred Learning Style No preference indicated   Learning Readiness Ready   How often do you need to have someone help you when you read instructions, pamphlets, or other written materials from your doctor or pharmacy? 1 - Never   What is the last  grade level you completed in school? GED     Pre-Education Assessment   Patient understands the diabetes disease and treatment process. Needs Review   Patient understands incorporating nutritional management into lifestyle. Needs Review   Patient undertands incorporating physical activity into lifestyle. Needs Review   Patient understands using medications safely. Needs Review   Patient understands monitoring blood glucose, interpreting and using results Needs Review   Patient understands prevention, detection, and treatment of acute complications. Needs Review   Patient understands prevention, detection, and treatment of chronic complications. Needs Review   Patient understands how to develop strategies to address psychosocial issues. Needs Review   Patient understands how to develop strategies to promote health/change behavior. Needs Review     Complications   Last HgB A1C per patient/outside source 8.4 %  05/18/16 and fructosamine 2328 07/07/16   How often do you check your blood sugar? 1-2 times/day   Fasting Blood glucose range (mg/dL) 70-129   Postprandial Blood glucose range (mg/dL) --  has not yet started after meal blood sugar testing   Number of hypoglycemic episodes per month 0   Number of hyperglycemic episodes per week 0   Have you had a dilated eye exam in the past 12 months? Yes   Have you had a dental exam in the past 12 months? No   Are you checking your feet? Yes   How many  days per week are you checking your feet? 7     Dietary Intake   Breakfast Peanut butter and jelly sandwich with 1/2 banana and coffee with stevia and powdered creamer  7-9   Snack (morning) none   Lunch Soup (tomatoes, green beans, onions)  with 1/2 pimento cheese sandwich or [place] sandwich  OR with NABS and occasional chips  1:30-2   Snack (afternoon) apple    Dinner grilled [place] burger and salad and potatoes OR grilled chicken sandwich without the bread from [employer] with small amount of  french fries and salad OR chicken pan pie with potato salad and tomato  6   Snack (evening) none   Beverage(s) coffee with stevia and powdered creamer, water, occasional stevia sweetened drink     Exercise   Exercise Type Light (walking / raking leaves)  problems walking sometimes due to neuropathy, has stairs in her home   How many days per week to you exercise? 2   How many minutes per day do you exercise? 30   Total minutes per week of exercise 60     Patient Education   Previous Diabetes Education No   Disease state  Other (comment)  Reviewe insulin resistance.   Nutrition management  Role of diet in the treatment of diabetes and the relationship between the three main macronutrients and blood glucose level;Food label reading, portion sizes and measuring food.;Meal options for control of blood glucose level and chronic complications.   Physical activity and exercise  Role of exercise on diabetes management, blood pressure control and cardiac health.;Helped patient identify appropriate exercises in relation to his/her diabetes, diabetes complications and other health issue.   Monitoring Purpose and frequency of SMBG.;Identified appropriate SMBG and/or A1C goals.;Yearly dilated eye exam;Daily foot exams   Chronic complications Relationship between chronic complications and blood glucose control   Psychosocial adjustment Worked with patient to identify barriers to care and solutions   Personal strategies to promote health Lifestyle issues that need to be addressed for better diabetes care     Individualized Goals (developed by patient)   Nutrition General guidelines for healthy choices and portions discussed   Physical Activity Exercise 5-7 days per week;30 minutes per day   Medications take my medication as prescribed   Monitoring  test my blood glucose as discussed   Problem Solving how to increase activity   Reducing Risk examine blood glucose patterns   Health Coping discuss  diabetes with (comment)  MD,RD     Post-Education Assessment   Patient understands the diabetes disease and treatment process. Demonstrates understanding / competency   Patient understands incorporating nutritional management into lifestyle. Demonstrates understanding / competency   Patient undertands incorporating physical activity into lifestyle. Demonstrates understanding / competency   Patient understands using medications safely. Demonstrates understanding / competency   Patient understands monitoring blood glucose, interpreting and using results Demonstrates understanding / competency   Patient understands prevention, detection, and treatment of acute complications. Demonstrates understanding / competency   Patient understands prevention, detection, and treatment of chronic complications. Demonstrates understanding / competency   Patient understands how to develop strategies to address psychosocial issues. Demonstrates understanding / competency   Patient understands how to develop strategies to promote health/change behavior. Demonstrates understanding / competency     Outcomes   Expected Outcomes Demonstrated interest in learning. Expect positive outcomes   Future DMSE PRN   Program Status Completed      Individualized Plan for Diabetes Self-Management Training:   Learning Objective:  Patient will have a greater understanding of diabetes self-management. Patient education plan is to attend individual and/or group sessions per assessed needs and concerns.   Plan:   Patient Instructions  Increase your activity by walking or armchair exercises 30 minutes most days of the week. Continue to be mindful about your food choices and serving sizes.  Aim for 3 Carb Choices per meal (45 grams) +/- 1 either way  Aim for 0-1 Carbs per snack if hungry  Include protein in moderation with your meals and snacks Consider reading food labels for Total Carbohydrate and Fat Grams of  foods Continue checking BG at alternate times per day as directed by MD  Continue taking medication as directed by MD       Expected Outcomes:  Demonstrated interest in learning. Expect positive outcomes  Education material provided: Living Well with Diabetes, Food label handouts, A1C conversion sheet, Meal plan card, My Plate and Snack sheet  If problems or questions, patient to contact team via:  Phone  Future DSME appointment: PRN

## 2016-07-13 NOTE — Patient Instructions (Signed)
Increase your activity by walking or armchair exercises 30 minutes most days of the week. Continue to be mindful about your food choices and serving sizes.  Aim for 3 Carb Choices per meal (45 grams) +/- 1 either way  Aim for 0-1 Carbs per snack if hungry  Include protein in moderation with your meals and snacks Consider reading food labels for Total Carbohydrate and Fat Grams of foods Continue checking BG at alternate times per day as directed by MD  Continue taking medication as directed by MD

## 2016-07-14 ENCOUNTER — Other Ambulatory Visit: Payer: Self-pay

## 2016-07-14 ENCOUNTER — Telehealth: Payer: Self-pay

## 2016-07-14 MED ORDER — ONDANSETRON HCL 4 MG PO TABS: 4.0000 mg | ORAL_TABLET | Freq: Three times a day (TID) | ORAL | 0 refills | Status: DC | PRN

## 2016-07-14 NOTE — Telephone Encounter (Signed)
Called patient and let her know that if her insurance would not cover her Promethazine HCI that she could try Zofran 4mg  tid PRN. She stated that she has already picked up her Promethazine from her pharmacy.

## 2016-07-14 NOTE — Telephone Encounter (Signed)
Pt calling back

## 2016-07-14 NOTE — Telephone Encounter (Signed)
Called and left a message to call us back regarding a new Rx.

## 2016-08-01 ENCOUNTER — Other Ambulatory Visit: Payer: Self-pay | Admitting: Endocrinology

## 2016-08-18 ENCOUNTER — Other Ambulatory Visit: Payer: Self-pay | Admitting: Family Medicine

## 2016-08-22 ENCOUNTER — Other Ambulatory Visit (INDEPENDENT_AMBULATORY_CARE_PROVIDER_SITE_OTHER): Payer: Medicare Other

## 2016-08-22 DIAGNOSIS — E119 Type 2 diabetes mellitus without complications: Secondary | ICD-10-CM | POA: Diagnosis not present

## 2016-08-22 DIAGNOSIS — E785 Hyperlipidemia, unspecified: Secondary | ICD-10-CM

## 2016-08-22 LAB — COMPREHENSIVE METABOLIC PANEL
ALBUMIN: 4.5 g/dL (ref 3.5–5.2)
ALT: 15 U/L (ref 0–35)
AST: 17 U/L (ref 0–37)
Alkaline Phosphatase: 35 U/L — ABNORMAL LOW (ref 39–117)
BUN: 17 mg/dL (ref 6–23)
CALCIUM: 9.9 mg/dL (ref 8.4–10.5)
CHLORIDE: 102 meq/L (ref 96–112)
CO2: 28 mEq/L (ref 19–32)
CREATININE: 0.62 mg/dL (ref 0.40–1.20)
GFR: 100.83 mL/min (ref >60.00)
Glucose, Bld: 105 mg/dL — ABNORMAL HIGH (ref 70–99)
POTASSIUM: 3.9 meq/L (ref 3.5–5.1)
Sodium: 137 mEq/L (ref 135–145)
Total Bilirubin: 0.5 mg/dL (ref 0.2–1.2)
Total Protein: 7.1 g/dL (ref 6.0–8.3)

## 2016-08-22 LAB — LIPID PANEL
CHOLESTEROL: 167 mg/dL (ref 0–200)
HDL: 44 mg/dL (ref >39.00)
NonHDL: 123.12
TRIGLYCERIDES: 216 mg/dL — AB (ref 0.0–149.0)
Total CHOL/HDL Ratio: 4
VLDL: 43.2 mg/dL — AB (ref 0.0–40.0)

## 2016-08-22 LAB — LDL CHOLESTEROL, DIRECT: LDL DIRECT: 91 mg/dL

## 2016-08-22 LAB — HEMOGLOBIN A1C: HEMOGLOBIN A1C: 7.1 % — AB (ref 4.6–6.5)

## 2016-09-05 ENCOUNTER — Other Ambulatory Visit: Payer: Self-pay | Admitting: *Deleted

## 2016-09-05 MED ORDER — DAPAGLIFLOZIN PROPANEDIOL 10 MG PO TABS: 10.0000 mg | ORAL_TABLET | Freq: Every day | ORAL | 0 refills | Status: DC

## 2016-09-07 ENCOUNTER — Other Ambulatory Visit (INDEPENDENT_AMBULATORY_CARE_PROVIDER_SITE_OTHER): Payer: Medicare Other

## 2016-09-07 DIAGNOSIS — E1165 Type 2 diabetes mellitus with hyperglycemia: Secondary | ICD-10-CM

## 2016-09-07 LAB — COMPREHENSIVE METABOLIC PANEL
ALT: 15 U/L (ref 0–35)
AST: 19 U/L (ref 0–37)
Albumin: 4.5 g/dL (ref 3.5–5.2)
Alkaline Phosphatase: 35 U/L — ABNORMAL LOW (ref 39–117)
BILIRUBIN TOTAL: 0.3 mg/dL (ref 0.2–1.2)
BUN: 19 mg/dL (ref 6–23)
CALCIUM: 10.1 mg/dL (ref 8.4–10.5)
CHLORIDE: 100 meq/L (ref 96–112)
CO2: 27 meq/L (ref 19–32)
CREATININE: 0.76 mg/dL (ref 0.40–1.20)
GFR: 79.71 mL/min (ref >60.00)
GLUCOSE: 111 mg/dL — AB (ref 70–99)
Potassium: 4 mEq/L (ref 3.5–5.1)
SODIUM: 136 meq/L (ref 135–145)
Total Protein: 6.9 g/dL (ref 6.0–8.3)

## 2016-09-07 LAB — HEMOGLOBIN A1C: Hgb A1c MFr Bld: 6.7 % — ABNORMAL HIGH (ref 4.6–6.5)

## 2016-09-12 ENCOUNTER — Ambulatory Visit (INDEPENDENT_AMBULATORY_CARE_PROVIDER_SITE_OTHER): Payer: Medicare Other | Admitting: Endocrinology

## 2016-09-12 ENCOUNTER — Encounter: Payer: Self-pay | Admitting: Endocrinology

## 2016-09-12 VITALS — BP 130/82 | HR 70 | Ht 68.0 in | Wt 192.4 lb

## 2016-09-12 DIAGNOSIS — E1165 Type 2 diabetes mellitus with hyperglycemia: Secondary | ICD-10-CM

## 2016-09-12 MED ORDER — GLIPIZIDE ER 5 MG PO TB24: 5.0000 mg | ORAL_TABLET | Freq: Every day | ORAL | 3 refills | Status: DC

## 2016-09-12 NOTE — Progress Notes (Signed)
Patient ID: Sandra Ray, female   DOB: [birth year], 71 y.o.   MRN: [id number]            Reason for Appointment: Follow-up for Type 2 Diabetes  Referring physician: Etter Sjogren   History of Present Illness:          Date of diagnosis of type 2 diabetes mellitus: 1996       Background history:   She had a high blood sugar on lab work which diagnosed her diabetes and she was most likely treated with metformin for a few years She has also taking glipizide for several years She was tried on Actos which was started because of significant weight gain. Over the last few years she has been taking either  Januvia or Tradjenta with her metformin in combination with fair control Her A1c has been below 8% only twice in the last 2-3 years  Recent history:   Her baseline A1c was 8.4% on her initial consultation and now it is 6.7  Non-insulin hypoglycemic drugs: Glipizide ER 10 mg, Trulicity 6.19 mg weekly, Farxiga 10 mg in the morning, Jentadueto 2.5 mg twice a day  Current management, blood sugar patterns and problems identified:  She has done well with Trulicity once a week and has continued to have improvement in blood sugars and weight  No nausea recently  She has not checked her blood sugars at home except 3 times in the morning, previously was having post prandial hyperglycemia  Also lab glucose has been near normal fasting recently  She is subjectively feeling better overall  She thinks she has been overall very active with working at home but not doing any formal walking exercise  About 2 or 3 times a week before suppertime she may feel a little weak and she has a little fruit at that time, not checking blood sugar when symptomatic  She does not feel the symptoms before lunch even though she is only eating cereal and toast in the morning mostly       Side effects from medications have been: Weight gain from Actos, nausea from high dose metformin  Compliance with the medical regimen:  Good  Glucose monitoring:  done <1 times a day         Glucometer: One Touch.      Blood Glucose readings by time of day and averages from meter   Recent fasting range 93-117 with only 3 readings  Self-care: The diet that the patient has been following is: tries to limit fried food.     Typical meal intake: Breakfast is cereal,almond milk,  lunch [place] burger and salad, dinner is soup and crackers and vegetables.  Snacks are usually small apple               Dietician visit, most recent: 07/13/16               Exercise: more walking recently  Weight history: Previous range 200-254  Wt Readings from Last 3 Encounters:  09/12/16 192 lb 6.4 oz (87.3 kg)  07/13/16 198 lb (89.8 kg)  07/12/16 200 lb 3.2 oz (90.8 kg)    Glycemic control:   Lab Results  Component Value Date   HGBA1C 6.7 (H) 09/07/2016   HGBA1C 7.1 (H) 08/22/2016   HGBA1C 8.4 (H) 05/18/2016   Lab Results  Component Value Date   MICROALBUR 0.9 07/07/2016   LDLCALC 81 05/22/2014   CREATININE 0.76 09/07/2016   Lab Results  Component Value Date  MICRALBCREAT 0.9 07/07/2016    Lab Results  Component Value Date   FRUCTOSAMINE 328 (H) 07/07/2016      Allergies as of 09/12/2016      Reactions   Penicillins    Causes yeast infections   Erythromycin    Passed out while taking      Medication List       Accurate as of 09/12/16 12:29 PM. Always use your most recent med list.          acetaminophen 650 MG CR tablet Commonly known as:  TYLENOL Take 1,300 mg by mouth daily.   aspirin 81 MG tablet Take 81 mg by mouth daily.   atorvastatin 20 MG tablet Commonly known as:  LIPITOR TAKE 1 TABLET BY MOUTH ONCE DAILY   Coenzyme Q10 100 MG Tabs Take 400 mg by mouth daily.   dapagliflozin propanediol 10 MG Tabs tablet Commonly known as:  FARXIGA Take 10 mg by mouth daily.   Dulaglutide 0.75 MG/0.5ML Sopn Commonly known as:  TRULICITY Inject 6.38 mg into the skin once a week.   fenofibrate micronized  130 MG capsule Commonly known as:  ANTARA Take 1 capsule (130 mg total) by mouth daily.   gabapentin 300 MG capsule Commonly known as:  NEURONTIN Take 1 capsule (300 mg total) by mouth 3 (three) times daily.   Garlic 7564 MG Caps Take 1 capsule by mouth daily.   glipiZIDE 5 MG 24 hr tablet Commonly known as:  GLUCOTROL XL Take 1 tablet (5 mg total) by mouth daily with breakfast.   glucose blood test strip Commonly known as:  ONE TOUCH ULTRA TEST Use as instructed once a day.  DX code E11.40   hydrochlorothiazide 25 MG tablet Commonly known as:  HYDRODIURIL Take 1 tablet (25 mg total) by mouth daily.   JENTADUETO 2.07-998 MG Tabs Generic drug:  Linagliptin-Metformin HCl Take 1 tablet by mouth 2 (two) times daily.   lisinopril 5 MG tablet Commonly known as:  PRINIVIL,ZESTRIL Take 1 tablet (5 mg total) by mouth daily.   ondansetron 4 MG tablet Commonly known as:  ZOFRAN Take 1 tablet (4 mg total) by mouth every 8 (eight) hours as needed for nausea or vomiting.   onetouch ultrasoft lancets Use as instructed once day.  E11.40   potassium chloride SA 20 MEQ tablet Commonly known as:  KLOR-CON M20 Take 1 tablet (20 mEq total) by mouth daily.   promethazine 25 MG tablet Commonly known as:  PHENERGAN TAKE ONE TABLET BY MOUTH 2 TIMES DAILY AS NEEDED   VENTOLIN HFA 108 (90 Base) MCG/ACT inhaler Generic drug:  albuterol Inhale 2 puffs into the lungs every 6 (six) hours as needed.   vitamin C 500 MG tablet Commonly known as:  ASCORBIC ACID Take 500 mg by mouth daily.   Vitamin D-3 5000 units Tabs Take by mouth daily.   vitamin E 400 UNIT capsule Take 400 Units by mouth daily.       Allergies:  Allergies  Allergen Reactions  . Penicillins     Causes yeast infections  . Erythromycin     Passed out while taking    Past Medical History:  Diagnosis Date  . Arthritis   . Breast abscess    right breast  . Carpal tunnel syndrome   . Degenerative disc disease     . Diabetes mellitus type II   . Eczema of hand   . Hx of colonic polyps 12/02/2014   2004 - 2 diminutive polyps   .  Hyperlipidemia   . Hypertension   . Metatarsal deformity   . Shingles   . Tubular adenoma of colon     Past Surgical History:  Procedure Laterality Date  . ABDOMINAL HYSTERECTOMY  1981  . ABSCESS DRAINAGE Right    breast  . APPENDECTOMY  1966  . carpel tunnel Right   . CESAREAN SECTION     x2  . COLONOSCOPY    . LUMBAR LAMINECTOMY     [id number] 2011 L4,L5,S1  . Right rotator cuff repair 09/2015    . TUBAL LIGATION      Family History  Problem Relation Age of Onset  . Hypertension Father   . Diabetes Mother   . Stroke Mother   . Hypertension Mother   . High Cholesterol Mother   . Heart disease Mother   . Diabetes Brother   . Breast cancer Daughter   . Breast cancer Maternal Aunt   . Breast cancer Sister   . Colon polyps Brother     Social History:  reports that she has never smoked. She has never used smokeless tobacco. She reports that she does not drink alcohol or use drugs.   Review of Systems   Lipid history: Has had high triglycerides and LDL Is on combination of Lipitor and fenofibrate    Lab Results  Component Value Date   CHOL 167 08/22/2016   HDL 44.00 08/22/2016   LDLCALC 81 05/22/2014   LDLDIRECT 91.0 08/22/2016   TRIG 216.0 (H) 08/22/2016   CHOLHDL 4 08/22/2016           Hypertension:Mild and controlled with 5 mg Lisinopril and 25 mg HCTZ prescribed by  PCP  Most recent eye exam was 04/2016  Most recent foot exam:06/2016 Has evidence of neuropathy on exam   LABS:  Lab on 09/07/2016  Component Date Value Ref Range Status  . Hgb A1c MFr Bld 09/07/2016 6.7* 4.6 - 6.5 % Final   Glycemic Control Guidelines for People with Diabetes:Non Diabetic:  <6%Goal of Therapy: <7%Additional Action Suggested:  >8%   . Sodium 09/07/2016 136  135 - 145 mEq/L Final  . Potassium 09/07/2016 4.0  3.5 - 5.1 mEq/L Final  . Chloride  09/07/2016 100  96 - 112 mEq/L Final  . CO2 09/07/2016 27  19 - 32 mEq/L Final  . Glucose, Bld 09/07/2016 111* 70 - 99 mg/dL Final  . BUN 09/07/2016 19  6 - 23 mg/dL Final  . Creatinine, Ser 09/07/2016 0.76  0.40 - 1.20 mg/dL Final  . Total Bilirubin 09/07/2016 0.3  0.2 - 1.2 mg/dL Final  . Alkaline Phosphatase 09/07/2016 35* 39 - 117 U/L Final  . AST 09/07/2016 19  0 - 37 U/L Final  . ALT 09/07/2016 15  0 - 35 U/L Final  . Total Protein 09/07/2016 6.9  6.0 - 8.3 g/dL Final  . Albumin 09/07/2016 4.5  3.5 - 5.2 g/dL Final  . Calcium 09/07/2016 10.1  8.4 - 10.5 mg/dL Final  . GFR 09/07/2016 79.71  >60.00 mL/min Final    Physical Examination:  BP 130/82   Pulse 70   Ht 5\' 8"  (1.727 m)   Wt 192 lb 6.4 oz (87.3 kg)   SpO2 97%   BMI 29.25 kg/m    ASSESSMENT:  Diabetes type 2, uncontrolled   See history of present illness for detailed discussion of current diabetes management, blood sugar patterns and problems identified  She has done well with adding  Trulicity 1.32 mg weekly to her regimen  She also has tried to modify her diet after seeing the dietitian and her A1c is now under 7  She has not monitored blood sugars at home much She does however report mild symptoms of hypoglycemia before dinnertime at times   PLAN:    Emphasized the need for checking blood sugars more regularly including after meals to help identify postprandial patterns and adjust her diet further.  Start regular walking  She will use 5 mg of glipizide ER instead of 10 and let us not blood sugars start going up Discussed blood sugar targets at various times  She will use Xigduo 07/998, 2 tablets daily instead of Jentadueto when she finishes this, she will call   Patient Instructions  Call when out of Jentadueto, will switch to combo Farxiga and Metformin  Check blood sugars on waking up  2/7 days  Also check blood sugars about 2 hours after a meal and do this after different meals by  rotation  Recommended blood sugar levels on waking up is 90-130 and about 2 hours after meal is 130-160  Please bring your blood sugar monitor to each visit, thank you     .  Sandra Ray 09/12/2016, 12:29 PM   Note: This office note was prepared with Dragon voice recognition system technology. Any transcriptional errors that result from this process are unintentional.

## 2016-09-12 NOTE — Patient Instructions (Signed)
Call when out of Jentadueto, will switch to combo Farxiga and Metformin  Check blood sugars on waking up  2/7 days  Also check blood sugars about 2 hours after a meal and do this after different meals by rotation  Recommended blood sugar levels on waking up is 90-130 and about 2 hours after meal is 130-160  Please bring your blood sugar monitor to each visit, thank you

## 2016-09-26 ENCOUNTER — Telehealth: Payer: Self-pay | Admitting: Family Medicine

## 2016-09-26 NOTE — Telephone Encounter (Signed)
Called left message to call back 

## 2016-09-26 NOTE — Telephone Encounter (Signed)
She said her email said the Teva HCTZ has been recalled and needs alternative.

## 2016-09-26 NOTE — Telephone Encounter (Signed)
Caller name: Tierra Relationship to patient: self Can be reached: [phone] Pharmacy: [facility] ([facility] [place] [address]  Reason for call: Pt received an email that Teva Hydrochlorothiazide is being recalled. Pt states she is not supposed to stop it but it contains a cancer causing drug and she is supposed to contact PCP about possibly changing meds. Please call asap.

## 2016-09-26 NOTE — Telephone Encounter (Signed)
Patient informed of response from PCP and will contact the pharmacy-

## 2016-09-26 NOTE — Telephone Encounter (Signed)
I assume its diovan but I don't see it on her med list

## 2016-09-26 NOTE — Telephone Encounter (Signed)
It is valsartan not hctz that has been recalled ---- pt could call pharmacy to be sure

## 2016-10-06 ENCOUNTER — Telehealth: Payer: Self-pay | Admitting: Endocrinology

## 2016-10-06 ENCOUNTER — Telehealth: Payer: Self-pay | Admitting: Family Medicine

## 2016-10-06 NOTE — Telephone Encounter (Signed)
[facility] will be sending ppw over for patients prescriptions.   Patient would like a call back once the company has been contacted/ppw has been sent back.  Thank you,  -LL

## 2016-10-06 NOTE — Telephone Encounter (Signed)
Caller name:Yazleemar Fairley Relationship to patient: Can be reached:[phone] Pharmacy:  Reason for call:[facility] will be sending paperwork over for patients prescriptions. Please call patient once received

## 2016-10-09 NOTE — Telephone Encounter (Signed)
Received Application & Rx from [facility] for Farxiga 10 mg; forwarded to provider/SLS 07/30

## 2016-10-10 NOTE — Telephone Encounter (Signed)
Faxed to sender with confirmation; Attempt to reach pt via phone, line busy/SLS 07/31

## 2016-10-17 NOTE — Telephone Encounter (Signed)
Called [facility] to receive the paperwork. Attn it to myself so I can fill out what is needed and fax back.

## 2016-10-19 ENCOUNTER — Telehealth: Payer: Self-pay | Admitting: Endocrinology

## 2016-10-19 NOTE — Telephone Encounter (Signed)
Called patient and left a voice message that we have received the paperwork from [facility] and this has been faxed to them on 10/10/2016 with a confirmation that it went through.

## 2016-10-19 NOTE — Telephone Encounter (Signed)
Patient called to check the status of the note below. Please advise before the end of the day per patient.

## 2016-10-19 NOTE — Telephone Encounter (Signed)
Patient called to see if [facility] faxed a form yesterday for her to get her Dulaglutide (TRULICITY) 5.94 LM/7.6JH SOPN. Please call [facility] at [phone] if this form was not received. Patient is out of medication. Please advise.

## 2016-10-19 NOTE — Telephone Encounter (Signed)
Routing to you °

## 2016-10-20 ENCOUNTER — Other Ambulatory Visit: Payer: Self-pay

## 2016-10-20 MED ORDER — DULAGLUTIDE 0.75 MG/0.5ML ~~LOC~~ SOAJ: 0.7500 mg | SUBCUTANEOUS | 4 refills | Status: DC

## 2016-10-20 NOTE — Telephone Encounter (Signed)
Routing to you °

## 2016-10-20 NOTE — Telephone Encounter (Signed)
Called patient and she stated that the forms were not all filled out and we went over them and I will have these signed by Dr. Dwyane Dee and sent out today with a new prescription.

## 2016-10-24 NOTE — Telephone Encounter (Signed)
Called patient and left a voice message to let her know that the forms were filled out correctly and sent last Friday to [facility] and if she needs anything to please call us and let us know.

## 2016-10-31 ENCOUNTER — Other Ambulatory Visit: Payer: Self-pay | Admitting: Family Medicine

## 2016-10-31 DIAGNOSIS — Z1231 Encounter for screening mammogram for malignant neoplasm of breast: Secondary | ICD-10-CM

## 2016-11-08 ENCOUNTER — Telehealth: Payer: Self-pay

## 2016-11-08 ENCOUNTER — Telehealth: Payer: Self-pay | Admitting: Endocrinology

## 2016-11-08 NOTE — Telephone Encounter (Signed)
Patient approved for Patient Assistance through [employer] until the end of the calendar year

## 2016-11-08 NOTE — Telephone Encounter (Signed)
Patient called to advise that her Trulicity was approved by the pharmaceutical company and will be delivered to the office in 7-10 days.

## 2016-11-08 NOTE — Telephone Encounter (Signed)
Routing to you °

## 2016-11-08 NOTE — Telephone Encounter (Signed)
Will be on the lookout for medication.

## 2016-11-09 ENCOUNTER — Telehealth: Payer: Self-pay

## 2016-11-09 NOTE — Telephone Encounter (Signed)
I contacted the patient and advised the patient assistance for the trulicity is ready for pick up via voicemail.

## 2016-11-19 ENCOUNTER — Other Ambulatory Visit: Payer: Self-pay | Admitting: Family Medicine

## 2016-11-23 ENCOUNTER — Encounter: Payer: Self-pay | Admitting: Family Medicine

## 2016-11-23 ENCOUNTER — Ambulatory Visit (INDEPENDENT_AMBULATORY_CARE_PROVIDER_SITE_OTHER): Payer: Medicare Other | Admitting: Family Medicine

## 2016-11-23 VITALS — BP 118/62 | HR 69 | Temp 97.5°F | Ht 68.0 in | Wt 188.2 lb

## 2016-11-23 DIAGNOSIS — I10 Essential (primary) hypertension: Secondary | ICD-10-CM

## 2016-11-23 DIAGNOSIS — E785 Hyperlipidemia, unspecified: Secondary | ICD-10-CM

## 2016-11-23 DIAGNOSIS — E1140 Type 2 diabetes mellitus with diabetic neuropathy, unspecified: Secondary | ICD-10-CM

## 2016-11-23 DIAGNOSIS — Z23 Encounter for immunization: Secondary | ICD-10-CM | POA: Diagnosis not present

## 2016-11-23 LAB — COMPREHENSIVE METABOLIC PANEL
ALBUMIN: 4.5 g/dL (ref 3.5–5.2)
ALK PHOS: 39 U/L (ref 39–117)
ALT: 20 U/L (ref 0–35)
AST: 22 U/L (ref 0–37)
BUN: 22 mg/dL (ref 6–23)
CO2: 26 mEq/L (ref 19–32)
CREATININE: 0.77 mg/dL (ref 0.40–1.20)
Calcium: 10.1 mg/dL (ref 8.4–10.5)
Chloride: 98 mEq/L (ref 96–112)
GFR: 78.47 mL/min (ref >60.00)
GLUCOSE: 106 mg/dL — AB (ref 70–99)
Potassium: 4 mEq/L (ref 3.5–5.1)
SODIUM: 135 meq/L (ref 135–145)
TOTAL PROTEIN: 7.1 g/dL (ref 6.0–8.3)
Total Bilirubin: 0.5 mg/dL (ref 0.2–1.2)

## 2016-11-23 LAB — LIPID PANEL
CHOLESTEROL: 162 mg/dL (ref 0–200)
HDL: 56.9 mg/dL (ref >39.00)
LDL Cholesterol: 72 mg/dL (ref 0–99)
NonHDL: 105.5
Total CHOL/HDL Ratio: 3
Triglycerides: 170 mg/dL — ABNORMAL HIGH (ref 0.0–149.0)
VLDL: 34 mg/dL (ref 0.0–40.0)

## 2016-11-23 NOTE — Assessment & Plan Note (Signed)
Tolerating statin, encouraged heart healthy diet, avoid trans fats, minimize simple carbs and saturated fats. Increase exercise as tolerated 

## 2016-11-23 NOTE — Assessment & Plan Note (Addendum)
,   minimize simple carbs. Increase exercise as tolerated. Continue current meds Per endo

## 2016-11-23 NOTE — Patient Instructions (Signed)

## 2016-11-23 NOTE — Assessment & Plan Note (Signed)
Well controlled, no changes to meds. Encouraged heart healthy diet such as the DASH diet and exercise as tolerated.  °

## 2016-11-23 NOTE — Progress Notes (Signed)
Patient ID: BRIELYNN SEKULA, female    DOB: 1945-10-20  Age: 71 y.o. MRN: [id number]    Subjective:  Subjective  HPI AMIR GLAUS presents for f/u dm, cholesterol and bp.   No complaints.    HYPERTENSION   Blood pressure range-not checking   Chest pain- no      Dyspnea- no Lightheadedness- no   Edema- no  Other side effects - no   Medication compliance: good Low salt diet- yes    DIABETES    Blood Sugar ranges-good per pt  Polyuria- no New Visual problems- no  Hypoglycemic symptoms- no  Other side effects-no Medication compliance - good Last eye exam- due Foot exam- endo   HYPERLIPIDEMIA  Medication compliance- good RUQ pain- no  Muscle aches- no Other side effects-no    Review of Systems  Constitutional: Negative for appetite change, diaphoresis, fatigue and unexpected weight change.  Eyes: Negative for pain, redness and visual disturbance.  Respiratory: Negative for cough, chest tightness, shortness of breath and wheezing.   Cardiovascular: Negative for chest pain, palpitations and leg swelling.  Endocrine: Negative for cold intolerance, heat intolerance, polydipsia, polyphagia and polyuria.  Genitourinary: Negative for difficulty urinating, dysuria and frequency.  Neurological: Negative for dizziness, light-headedness, numbness and headaches.    History Past Medical History:  Diagnosis Date  . Arthritis   . Breast abscess    right breast  . Carpal tunnel syndrome   . Degenerative disc disease   . Diabetes mellitus type II   . Eczema of hand   . Hx of colonic polyps 12/02/2014   2004 - 2 diminutive polyps   . Hyperlipidemia   . Hypertension   . Metatarsal deformity   . Shingles   . Tubular adenoma of colon     She has a past surgical history that includes Lumbar laminectomy; Cesarean section; Tubal ligation; Abdominal hysterectomy (1981); carpel tunnel (Right); Abscess drainage (Right); Appendectomy (1966); Colonoscopy; and Right rotator cuff repair  09/2015.   Her family history includes Breast cancer in her daughter, maternal aunt, and sister; Colon polyps in her brother; Diabetes in her brother and mother; Heart disease in her mother; High Cholesterol in her mother; Hypertension in her father and mother; Stroke in her mother.She reports that she has never smoked. She has never used smokeless tobacco. She reports that she does not drink alcohol or use drugs.  Current Outpatient Prescriptions on File Prior to Visit  Medication Sig Dispense Refill  . acetaminophen (TYLENOL) 650 MG CR tablet Take 1,300 mg by mouth daily.     Marland Kitchen albuterol (VENTOLIN HFA) 108 (90 BASE) MCG/ACT inhaler Inhale 2 puffs into the lungs every 6 (six) hours as needed.      . Ascorbic Acid (VITAMIN C) 500 MG tablet Take 500 mg by mouth daily.      Marland Kitchen aspirin 81 MG tablet Take 81 mg by mouth daily.      Marland Kitchen atorvastatin (LIPITOR) 20 MG tablet TAKE 1 TABLET BY MOUTH ONCE DAILY 90 tablet 0  . Cholecalciferol (VITAMIN D-3) 5000 UNITS TABS Take by mouth daily.     . Coenzyme Q10 100 MG TABS Take 400 mg by mouth daily.    . dapagliflozin propanediol (FARXIGA) 10 MG TABS tablet Take 10 mg by mouth daily. 90 tablet 0  . Dulaglutide (TRULICITY) 7.78 EU/2.3NT SOPN Inject 0.75 mg into the skin once a week. 4 pen 4  . fenofibrate micronized (ANTARA) 130 MG capsule Take 1 capsule (130 mg total) by  mouth daily. 90 capsule 1  . gabapentin (NEURONTIN) 300 MG capsule Take 1 capsule (300 mg total) by mouth 3 (three) times daily. 270 capsule 1  . Garlic 8119 MG CAPS Take 1 capsule by mouth daily.     Marland Kitchen glipiZIDE (GLUCOTROL XL) 5 MG 24 hr tablet Take 1 tablet (5 mg total) by mouth daily with breakfast. 90 tablet 3  . glucose blood (ONE TOUCH ULTRA TEST) test strip Use as instructed once a day.  DX code E11.40 100 each 5  . hydrochlorothiazide (HYDRODIURIL) 25 MG tablet Take 1 tablet (25 mg total) by mouth daily. 90 tablet 1  . JENTADUETO 2.07-998 MG TABS Take 1 tablet by mouth 2 (two) times  daily. 180 tablet 1  . Lancets (ONETOUCH ULTRASOFT) lancets Use as instructed once day.  E11.40 100 each 5  . lisinopril (PRINIVIL,ZESTRIL) 5 MG tablet Take 1 tablet (5 mg total) by mouth daily. 90 tablet 1  . ondansetron (ZOFRAN) 4 MG tablet Take 1 tablet (4 mg total) by mouth every 8 (eight) hours as needed for nausea or vomiting. 20 tablet 0  . potassium chloride SA (KLOR-CON M20) 20 MEQ tablet Take 1 tablet (20 mEq total) by mouth daily. 90 tablet 1  . promethazine (PHENERGAN) 25 MG tablet TAKE ONE TABLET BY MOUTH 2 TIMES DAILY AS NEEDED 60 tablet 0  . vitamin E 400 UNIT capsule Take 400 Units by mouth daily.       No current facility-administered medications on file prior to visit.      Objective:  Objective  Physical Exam  Constitutional: She is oriented to person, place, and time. She appears well-developed and well-nourished.  HENT:  Head: Normocephalic and atraumatic.  Eyes: Conjunctivae and EOM are normal.  Neck: Normal range of motion. Neck supple. No JVD present. Carotid bruit is not present. No thyromegaly present.  Cardiovascular: Normal rate, regular rhythm and normal heart sounds.   No murmur heard. Pulmonary/Chest: Effort normal and breath sounds normal. No respiratory distress. She has no wheezes. She has no rales. She exhibits no tenderness.  Musculoskeletal: She exhibits no edema.  Neurological: She is alert and oriented to person, place, and time.  Psychiatric: She has a normal mood and affect.   BP 118/62 (BP Location: Left Arm, Patient Position: Sitting, Cuff Size: Normal)   Pulse 69   Temp (!) 97.5 F (36.4 C) (Oral)   Ht 5\' 8"  (1.727 m)   Wt 188 lb 3.2 oz (85.4 kg)   SpO2 96%   BMI 28.62 kg/m  Wt Readings from Last 3 Encounters:  11/23/16 188 lb 3.2 oz (85.4 kg)  09/12/16 192 lb 6.4 oz (87.3 kg)  07/13/16 198 lb (89.8 kg)     Lab Results  Component Value Date   WBC 8.5 05/18/2015   HGB 14.2 05/18/2015   HCT 42.3 05/18/2015   PLT 294.0 05/18/2015    GLUCOSE 111 (H) 09/07/2016   CHOL 167 08/22/2016   TRIG 216.0 (H) 08/22/2016   HDL 44.00 08/22/2016   LDLDIRECT 91.0 08/22/2016   LDLCALC 81 05/22/2014   ALT 15 09/07/2016   AST 19 09/07/2016   NA 136 09/07/2016   K 4.0 09/07/2016   CL 100 09/07/2016   CREATININE 0.76 09/07/2016   BUN 19 09/07/2016   CO2 27 09/07/2016   TSH 0.59 10/22/2014   INR 1.19 10/28/2009   HGBA1C 6.7 (H) 09/07/2016   MICROALBUR 0.9 07/07/2016    Mm Screening Breast Tomo Bilateral  Result Date: 12/16/2015  CLINICAL DATA:  Screening. EXAM: 2D DIGITAL SCREENING BILATERAL MAMMOGRAM WITH CAD AND ADJUNCT TOMO COMPARISON:  Previous exam(s). ACR Breast Density Category b: There are scattered areas of fibroglandular density. FINDINGS: There are no findings suspicious for malignancy. Images were processed with CAD. IMPRESSION: No mammographic evidence of malignancy. A result letter of this screening mammogram will be mailed directly to the patient. RECOMMENDATION: Screening mammogram in one year. (Code:SM-B-01Y) BI-RADS CATEGORY  1: Negative. Electronically Signed   By: Curlene Dolphin M.D.   On: 12/20/2015 16:43     Assessment & Plan:  Plan  I am having Ms. Jubb maintain her aspirin, acetaminophen, albuterol, vitamin C, vitamin E, Vitamin D-3, Garlic, Coenzyme B34, glucose blood, onetouch ultrasoft, fenofibrate micronized, gabapentin, potassium chloride SA, lisinopril, hydrochlorothiazide, JENTADUETO, promethazine, ondansetron, dapagliflozin propanediol, glipiZIDE, Dulaglutide, and atorvastatin.  No orders of the defined types were placed in this encounter.   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 Increase exercise as tolerated. Continue current meds Per endo       Essential hypertension    Well controlled, no changes to meds. Encouraged heart healthy diet such as the DASH diet and exercise as tolerated.        Hyperlipidemia LDL goal <70    Tolerating statin, encouraged heart healthy diet, avoid trans fats, minimize simple carbs and saturated fats. Increase exercise as tolerated         Follow-up: Return in about 6 months (around 05/23/2017) for hypertension, hyperlipidemia, diabetes II.  Ann Held, DO

## 2016-12-12 ENCOUNTER — Other Ambulatory Visit (INDEPENDENT_AMBULATORY_CARE_PROVIDER_SITE_OTHER): Payer: Medicare Other

## 2016-12-12 DIAGNOSIS — E1165 Type 2 diabetes mellitus with hyperglycemia: Secondary | ICD-10-CM

## 2016-12-12 LAB — BASIC METABOLIC PANEL
BUN: 19 mg/dL (ref 6–23)
CHLORIDE: 100 meq/L (ref 96–112)
CO2: 29 meq/L (ref 19–32)
CREATININE: 0.67 mg/dL (ref 0.40–1.20)
Calcium: 9.7 mg/dL (ref 8.4–10.5)
GFR: 92.12 mL/min (ref >60.00)
Glucose, Bld: 99 mg/dL (ref 70–99)
Potassium: 3.7 mEq/L (ref 3.5–5.1)
Sodium: 138 mEq/L (ref 135–145)

## 2016-12-12 LAB — HEMOGLOBIN A1C: Hgb A1c MFr Bld: 6.6 % — ABNORMAL HIGH (ref 4.6–6.5)

## 2016-12-15 ENCOUNTER — Ambulatory Visit (INDEPENDENT_AMBULATORY_CARE_PROVIDER_SITE_OTHER): Payer: Medicare Other | Admitting: Endocrinology

## 2016-12-15 ENCOUNTER — Encounter: Payer: Self-pay | Admitting: Endocrinology

## 2016-12-15 ENCOUNTER — Other Ambulatory Visit: Payer: Self-pay | Admitting: Family Medicine

## 2016-12-15 VITALS — BP 130/80 | HR 73 | Ht 68.0 in | Wt 189.4 lb

## 2016-12-15 DIAGNOSIS — Z794 Long term (current) use of insulin: Secondary | ICD-10-CM

## 2016-12-15 DIAGNOSIS — I10 Essential (primary) hypertension: Secondary | ICD-10-CM

## 2016-12-15 DIAGNOSIS — E1165 Type 2 diabetes mellitus with hyperglycemia: Secondary | ICD-10-CM | POA: Diagnosis not present

## 2016-12-15 DIAGNOSIS — E785 Hyperlipidemia, unspecified: Secondary | ICD-10-CM

## 2016-12-15 NOTE — Progress Notes (Signed)
Patient ID: Sandra Ray, female   DOB: [birth year], 71 y.o.   MRN: [id number]            Reason for Appointment: Follow-up for Type 2 Diabetes  Referring physician: Etter Sjogren   History of Present Illness:          Date of diagnosis of type 2 diabetes mellitus: 1996       Background history:   She had a high blood sugar on lab work which diagnosed her diabetes and she was most likely treated with metformin for a few years She has also taking glipizide for several years She was tried on Actos which was started because of significant weight gain. Over the last few years she has been taking either  Januvia or Tradjenta with her metformin in combination with fair control Her A1c has been below 8% only twice in the last 2-3 years  Recent history:   Her baseline A1c was 8.4% on her initial consultation and now it is 6.6  Non-insulin hypoglycemic drugs: Glipizide ER 5 mg, Trulicity 0.34 mg weekly, Farxiga 10 mg in the morning, Jentadueto 2.5 mg twice a day  Current management, blood sugar patterns and problems identified:  She was able to reduce her dose of GLIPIZIDE with adding Trulicity especially since on her last visit she was having mild hypoglycemic symptoms.  She is checking her blood sugars only sporadically and mostly in the mornings again, she is having some difficulties using her test strips currently  Her weight is still slightly better than before although not changed in the last month  She has not been trying to exercise and also has some limiting factors  She does think that she is watching her portions usually an occasional high reading may be from eating fruit or cereal, has had consultation with dietitian earlier this year  Highest blood sugar at home was 180 after lunch  She has done well with Trulicity once a week and has continued to have improvement in blood sugars and weight       Side effects from medications have been: Weight gain from Actos, nausea from high  dose metformin  Compliance with the medical regimen: Good  Glucose monitoring:  done <1 times a day         Glucometer: One Touch.      Blood Glucose readings by time of day and averages from meter   FASTING range 102-114 Nonfasting range 1 26-180 with only 4 or 5 readings Overall median 113  Self-care: The diet that the patient has been following is: tries to limit fried food.     Typical meal intake: Breakfast is cereal,almond milk,  lunch [place] burger and salad, dinner is soup and crackers and vegetables.  Snacks are usually small apple                Dietician visit, most recent: 07/13/16               Exercise: some walking   Weight history: Previous range 200-254  Wt Readings from Last 3 Encounters:  12/15/16 189 lb 6.4 oz (85.9 kg)  11/23/16 188 lb 3.2 oz (85.4 kg)  09/12/16 192 lb 6.4 oz (87.3 kg)    Glycemic control:   Lab Results  Component Value Date   HGBA1C 6.6 (H) 12/12/2016   HGBA1C 6.7 (H) 09/07/2016   HGBA1C 7.1 (H) 08/22/2016   Lab Results  Component Value Date   MICROALBUR 0.9 07/07/2016   LDLCALC 72 11/23/2016  CREATININE 0.67 12/12/2016   Lab Results  Component Value Date   MICRALBCREAT 0.9 07/07/2016    Lab Results  Component Value Date   FRUCTOSAMINE 328 (H) 07/07/2016      Allergies as of 12/15/2016      Reactions   Penicillins    Causes yeast infections   Amoxicillin Other (See Comments)   Pt states it causes her a yeast infection    Erythromycin    Passed out while taking      Medication List       Accurate as of 12/15/16 11:59 PM. Always use your most recent med list.          acetaminophen 650 MG CR tablet Commonly known as:  TYLENOL Take 1,300 mg by mouth daily.   aspirin 81 MG tablet Take 81 mg by mouth daily.   atorvastatin 20 MG tablet Commonly known as:  LIPITOR TAKE 1 TABLET BY MOUTH ONCE DAILY   Coenzyme Q10 100 MG Tabs Take 400 mg by mouth daily.   dapagliflozin propanediol 10 MG Tabs  tablet Commonly known as:  FARXIGA Take 10 mg by mouth daily.   Dulaglutide 0.75 MG/0.5ML Sopn Commonly known as:  TRULICITY Inject 2.42 mg into the skin once a week.   fenofibrate micronized 130 MG capsule Commonly known as:  ANTARA Take 1 capsule (130 mg total) by mouth daily.   gabapentin 300 MG capsule Commonly known as:  NEURONTIN Take 1 capsule (300 mg total) by mouth 3 (three) times daily.   Garlic 6834 MG Caps Take 1 capsule by mouth daily.   glipiZIDE 5 MG 24 hr tablet Commonly known as:  GLUCOTROL XL Take 1 tablet (5 mg total) by mouth daily with breakfast.   glucose blood test strip Commonly known as:  ONE TOUCH ULTRA TEST Use as instructed once a day.  DX code E11.40   hydrochlorothiazide 25 MG tablet Commonly known as:  HYDRODIURIL Take 1 tablet (25 mg total) by mouth daily.   JENTADUETO 2.07-998 MG Tabs Generic drug:  Linagliptin-Metformin HCl Take 1 tablet by mouth 2 (two) times daily.   lisinopril 5 MG tablet Commonly known as:  PRINIVIL,ZESTRIL Take 1 tablet (5 mg total) by mouth daily.   ondansetron 4 MG tablet Commonly known as:  ZOFRAN Take 1 tablet (4 mg total) by mouth every 8 (eight) hours as needed for nausea or vomiting.   onetouch ultrasoft lancets Use as instructed once day.  E11.40   potassium chloride SA 20 MEQ tablet Commonly known as:  KLOR-CON M20 Take 1 tablet (20 mEq total) by mouth daily.   promethazine 25 MG tablet Commonly known as:  PHENERGAN TAKE ONE TABLET BY MOUTH 2 TIMES DAILY AS NEEDED   VENTOLIN HFA 108 (90 Base) MCG/ACT inhaler Generic drug:  albuterol Inhale 2 puffs into the lungs every 6 (six) hours as needed.   vitamin C 500 MG tablet Commonly known as:  ASCORBIC ACID Take 500 mg by mouth daily.   Vitamin D-3 5000 units Tabs Take by mouth daily.   vitamin E 400 UNIT capsule Take 400 Units by mouth daily.       Allergies:  Allergies  Allergen Reactions  . Penicillins     Causes yeast infections   . Amoxicillin Other (See Comments)    Pt states it causes her a yeast infection   . Erythromycin     Passed out while taking    Past Medical History:  Diagnosis Date  . Arthritis   .  Breast abscess    right breast  . Carpal tunnel syndrome   . Degenerative disc disease   . Diabetes mellitus type II   . Eczema of hand   . Hx of colonic polyps 12/02/2014   2004 - 2 diminutive polyps   . Hyperlipidemia   . Hypertension   . Metatarsal deformity   . Shingles   . Tubular adenoma of colon     Past Surgical History:  Procedure Laterality Date  . ABDOMINAL HYSTERECTOMY  1981  . ABSCESS DRAINAGE Right    breast  . APPENDECTOMY  1966  . carpel tunnel Right   . CESAREAN SECTION     x2  . COLONOSCOPY    . LUMBAR LAMINECTOMY     [id number]. 2011 L4,L5,S1  . Right rotator cuff repair 09/2015    . TUBAL LIGATION      Family History  Problem Relation Age of Onset  . Hypertension Father   . Diabetes Mother   . Stroke Mother   . Hypertension Mother   . High Cholesterol Mother   . Heart disease Mother   . Diabetes Brother   . Breast cancer Daughter   . Breast cancer Maternal Aunt   . Breast cancer Sister   . Colon polyps Brother     Social History:  reports that she has never smoked. She has never used smokeless tobacco. She reports that she does not drink alcohol or use drugs.   Review of Systems   Lipid history: Has had high triglycerides and LDL Is on combination of Lipitor and fenofibrate  LDL is close to 70 and triglycerides are improving, reassured her that her labs are looking excellent now   Lab Results  Component Value Date   CHOL 162 11/23/2016   HDL 56.90 11/23/2016   LDLCALC 72 11/23/2016   LDLDIRECT 91.0 08/22/2016   TRIG 170.0 (H) 11/23/2016   CHOLHDL 3 11/23/2016           Hypertension:Mild and controlled with 5 mg Lisinopril and 25 mg HCTZ prescribed by  PCP  Most recent eye exam was 04/2016  Most recent foot exam:06/2016 Has evidence of  neuropathy on exam   LABS:  Lab on 12/12/2016  Component Date Value Ref Range Status  . Hgb A1c MFr Bld 12/12/2016 6.6* 4.6 - 6.5 % Final   Glycemic Control Guidelines for People with Diabetes:Non Diabetic:  <6%Goal of Therapy: <7%Additional Action Suggested:  >8%   . Sodium 12/12/2016 138  135 - 145 mEq/L Final  . Potassium 12/12/2016 3.7  3.5 - 5.1 mEq/L Final  . Chloride 12/12/2016 100  96 - 112 mEq/L Final  . CO2 12/12/2016 29  19 - 32 mEq/L Final  . Glucose, Bld 12/12/2016 99  70 - 99 mg/dL Final  . BUN 12/12/2016 19  6 - 23 mg/dL Final  . Creatinine, Ser 12/12/2016 0.67  0.40 - 1.20 mg/dL Final  . Calcium 12/12/2016 9.7  8.4 - 10.5 mg/dL Final  . GFR 12/12/2016 92.12  >60.00 mL/min Final    Physical Examination:  BP 130/80   Pulse 73   Ht 5\' 8"  (1.727 m)   Wt 189 lb 6.4 oz (85.9 kg)   SpO2 96%   BMI 28.80 kg/m    ASSESSMENT:  Diabetes type 2, uncontrolled   See history of present illness for detailed discussion of current diabetes management, blood sugar patterns and problems identified   Her A1c is again excellent at 6.6 She is benefiting from using [place]  as well as Trulicity with fairly stable blood sugar patterns although not checking enough at home Also she can do better with exercise   PLAN:     she will try to check more readings after meals rather than the morning and call the [employer] company if she has any issues with test strips  She is to try and walk more regularly even if it is for short distances at a time  No change in medications  Also she will call if she starts having any hypoglycemic symptoms again   She will use Xigduo 07/998, 2 tablets daily instead of Jentadueto when she finishes Her supply, she still has 90 tablet   Patient Instructions  Walk daily    .  Sandra Ray 12/17/2016, 6:48 PM   Note: This office note was prepared with Dragon voice recognition system technology. Any transcriptional errors that result from this  process are unintentional.

## 2016-12-15 NOTE — Patient Instructions (Signed)
Walk daily 

## 2016-12-24 ENCOUNTER — Other Ambulatory Visit: Payer: Self-pay | Admitting: Family Medicine

## 2016-12-24 DIAGNOSIS — E785 Hyperlipidemia, unspecified: Secondary | ICD-10-CM

## 2016-12-27 ENCOUNTER — Ambulatory Visit: Payer: Medicare Other

## 2017-01-10 ENCOUNTER — Other Ambulatory Visit: Payer: Self-pay | Admitting: Family Medicine

## 2017-01-10 DIAGNOSIS — E876 Hypokalemia: Secondary | ICD-10-CM

## 2017-01-11 ENCOUNTER — Ambulatory Visit
Admission: RE | Admit: 2017-01-11 | Discharge: 2017-01-11 | Disposition: A | Discharge status: Home / Self Care | Payer: Medicare Other | Source: Ambulatory Visit | Attending: Family Medicine | Admitting: Family Medicine

## 2017-01-11 DIAGNOSIS — Z1231 Encounter for screening mammogram for malignant neoplasm of breast: Secondary | ICD-10-CM

## 2017-01-12 ENCOUNTER — Other Ambulatory Visit: Payer: Self-pay | Admitting: Family Medicine

## 2017-01-12 DIAGNOSIS — E1151 Type 2 diabetes mellitus with diabetic peripheral angiopathy without gangrene: Secondary | ICD-10-CM

## 2017-01-16 DIAGNOSIS — Z23 Encounter for immunization: Secondary | ICD-10-CM | POA: Diagnosis not present

## 2017-01-17 ENCOUNTER — Other Ambulatory Visit: Payer: Self-pay

## 2017-01-17 DIAGNOSIS — E1151 Type 2 diabetes mellitus with diabetic peripheral angiopathy without gangrene: Secondary | ICD-10-CM

## 2017-01-17 DIAGNOSIS — E876 Hypokalemia: Secondary | ICD-10-CM

## 2017-01-17 MED ORDER — POTASSIUM CHLORIDE CRYS ER 20 MEQ PO TBCR: 20.0000 meq | EXTENDED_RELEASE_TABLET | Freq: Every day | ORAL | 1 refills | Status: DC

## 2017-01-17 MED ORDER — GABAPENTIN 300 MG PO CAPS: 300.0000 mg | ORAL_CAPSULE | Freq: Three times a day (TID) | ORAL | 1 refills | Status: DC

## 2017-01-30 ENCOUNTER — Telehealth: Payer: Self-pay | Admitting: Family Medicine

## 2017-01-30 NOTE — Telephone Encounter (Signed)
Copied from [place] [phone]. Topic: Inquiry >> Jan 30, 2017  9:59 AM Percell Belt A wrote: Reason for CRM:        Pt and her husband would like to transfer from [place] location to [place] location.  They only live 5 mins from [place] Please contact for Transfer   Best number [phone]  >> Jan 30, 2017 10:14 AM Carollee Herter, Alferd Apa, DO wrote: Approved

## 2017-02-05 NOTE — Telephone Encounter (Signed)
Patient informed ok per PCP. She will call [facility] to schedule new appointment

## 2017-02-08 ENCOUNTER — Encounter: Payer: Self-pay | Admitting: Nurse Practitioner

## 2017-02-08 ENCOUNTER — Ambulatory Visit (INDEPENDENT_AMBULATORY_CARE_PROVIDER_SITE_OTHER): Payer: Medicare Other | Admitting: Nurse Practitioner

## 2017-02-08 VITALS — BP 118/70 | HR 74 | Temp 97.7°F | Ht 68.0 in | Wt 180.0 lb

## 2017-02-08 DIAGNOSIS — J069 Acute upper respiratory infection, unspecified: Secondary | ICD-10-CM | POA: Diagnosis not present

## 2017-02-08 DIAGNOSIS — E785 Hyperlipidemia, unspecified: Secondary | ICD-10-CM

## 2017-02-08 MED ORDER — IPRATROPIUM BROMIDE 0.03 % NA SOLN: 2.0000 | Freq: Two times a day (BID) | NASAL | 0 refills | Status: DC

## 2017-02-08 MED ORDER — CETIRIZINE HCL 10 MG PO TABS: 10.0000 mg | ORAL_TABLET | Freq: Every day | ORAL | 0 refills | Status: DC

## 2017-02-08 MED ORDER — ATORVASTATIN CALCIUM 20 MG PO TABS: 20.0000 mg | ORAL_TABLET | Freq: Every day | ORAL | 1 refills | Status: DC

## 2017-02-08 MED ORDER — DEXTROMETHORPHAN-GUAIFENESIN 5-100 MG/5ML PO LIQD
5.0000 mL | Freq: Three times a day (TID) | ORAL | 0 refills | Status: DC | PRN
Start: 2017-02-08 — End: 2017-03-30

## 2017-02-08 NOTE — Patient Instructions (Addendum)
It was a great please meeting you and your husband. You labs are stable and medications have been sent.I will see you in 42months. Continue to follow up with endocrinology as directed.  Merry christmas.  Upper Respiratory Infection, Adult Most upper respiratory infections (URIs) are caused by a virus. A URI affects the nose, throat, and upper air passages. The most common type of URI is often called "the common cold." Follow these instructions at home:  Take medicines only as told by your doctor.  Gargle warm saltwater or take cough drops to comfort your throat as told by your doctor.  Use a warm mist humidifier or inhale steam from a shower to increase air moisture. This may make it easier to breathe.  Drink enough fluid to keep your pee (urine) clear or pale yellow.  Eat soups and other clear broths.  Have a healthy diet.  Rest as needed.  Go back to work when your fever is gone or your doctor says it is okay. ? You may need to stay home longer to avoid giving your URI to others. ? You can also wear a face mask and wash your hands often to prevent spread of the virus.  Use your inhaler more if you have asthma.  Do not use any tobacco products, including cigarettes, chewing tobacco, or electronic cigarettes. If you need help quitting, ask your doctor. Contact a doctor if:  You are getting worse, not better.  Your symptoms are not helped by medicine.  You have chills.  You are getting more short of breath.  You have brown or red mucus.  You have yellow or brown discharge from your nose.  You have pain in your face, especially when you bend forward.  You have a fever.  You have puffy (swollen) neck glands.  You have pain while swallowing.  You have white areas in the back of your throat. Get help right away if:  You have very bad or constant: ? Headache. ? Ear pain. ? Pain in your forehead, behind your eyes, and over your cheekbones (sinus pain). ? Chest  pain.  You have long-lasting (chronic) lung disease and any of the following: ? Wheezing. ? Long-lasting cough. ? Coughing up blood. ? A change in your usual mucus.  You have a stiff neck.  You have changes in your: ? Vision. ? Hearing. ? Thinking. ? Mood. This information is not intended to replace advice given to you by your health care provider. Make sure you discuss any questions you have with your health care provider. Document Released: 08/16/2007 Document Revised: 10/31/2015 Document Reviewed: 06/04/2013 Elsevier Interactive Patient Education  2018 [employer].

## 2017-02-08 NOTE — Progress Notes (Signed)
Subjective:  Patient ID: Sandra Ray, female    DOB: December 29, 1945  Age: 71 y.o. MRN: [id number]  CC: Establish Care (transfer from Dr. Heath Gold wk) and Medication Refill (lipitor refills?)  Accompanied by Husband. Transferring care from Dr. Etter Sjogren to me.  Cough  This is a new problem. The current episode started 1 to 4 weeks ago. The problem has been unchanged. The cough is productive of sputum. Associated symptoms include nasal congestion, postnasal drip and rhinorrhea. Pertinent negatives include no chest pain, chills, ear congestion, ear pain, fever, headaches, heartburn, hemoptysis, myalgias, rash, sore throat, shortness of breath, sweats, weight loss or wheezing. The symptoms are aggravated by lying down and cold air. Treatments tried: flonase. The treatment provided no relief. Her past medical history is significant for environmental allergies.   Sinus congestion and cough x 2weeks: Clear sputum and nasal congestion.  Hyperlipidemia: Denies any adverse side effects with atorvastatin and fenofibrate. Needs atorvastatin refilled. LDL at goal. Lipid Panel     Component Value Date/Time   CHOL 162 11/23/2016 1033   TRIG 170.0 (H) 11/23/2016 1033   HDL 56.90 11/23/2016 1033   CHOLHDL 3 11/23/2016 1033   VLDL 34.0 11/23/2016 1033   LDLCALC 72 11/23/2016 1033   LDLDIRECT 91.0 08/22/2016 0907   Outpatient Medications Prior to Visit  Medication Sig Dispense Refill  . acetaminophen (TYLENOL) 650 MG CR tablet Take 1,300 mg by mouth daily.     Marland Kitchen albuterol (VENTOLIN HFA) 108 (90 BASE) MCG/ACT inhaler Inhale 2 puffs into the lungs every 6 (six) hours as needed.      . Ascorbic Acid (VITAMIN C) 500 MG tablet Take 500 mg by mouth daily.      Marland Kitchen aspirin 81 MG tablet Take 81 mg by mouth daily.      . Cholecalciferol (VITAMIN D-3) 5000 UNITS TABS Take by mouth daily.     . Coenzyme Q10 100 MG TABS Take 400 mg by mouth daily.    . cyanocobalamin 500 MCG tablet Take 500 mcg by mouth  daily.    . dapagliflozin propanediol (FARXIGA) 10 MG TABS tablet Take 10 mg by mouth daily. 90 tablet 0  . Dulaglutide (TRULICITY) 8.58 IF/0.2DX SOPN Inject 0.75 mg into the skin once a week. 4 pen 4  . fenofibrate micronized (ANTARA) 130 MG capsule TAKE 1 CAPSULE BY MOUTH EVERY DAY 90 capsule 1  . gabapentin (NEURONTIN) 300 MG capsule Take 1 capsule (300 mg total) 3 (three) times daily by mouth. 270 capsule 1  . Garlic 4128 MG CAPS Take 1 capsule by mouth daily.     Marland Kitchen glipiZIDE (GLUCOTROL XL) 5 MG 24 hr tablet Take 1 tablet (5 mg total) by mouth daily with breakfast. 90 tablet 3  . glucose blood (ONE TOUCH ULTRA TEST) test strip Use as instructed once a day.  DX code E11.40 100 each 5  . hydrochlorothiazide (HYDRODIURIL) 25 MG tablet Take 1 tablet (25 mg total) by mouth daily. 90 tablet 1  . JENTADUETO 2.07-998 MG TABS Take 1 tablet by mouth 2 (two) times daily. 180 tablet 1  . Lancets (ONETOUCH ULTRASOFT) lancets Use as instructed once day.  E11.40 100 each 5  . lisinopril (PRINIVIL,ZESTRIL) 5 MG tablet TAKE 1 TABLET BY MOUTH ONCE DAILY 90 tablet 1  . ondansetron (ZOFRAN) 4 MG tablet Take 1 tablet (4 mg total) by mouth every 8 (eight) hours as needed for nausea or vomiting. 20 tablet 0  . potassium chloride SA (KLOR-CON M20) 20 MEQ tablet  Take 1 tablet (20 mEq total) daily by mouth. 90 tablet 1  . promethazine (PHENERGAN) 25 MG tablet TAKE ONE TABLET BY MOUTH 2 TIMES DAILY AS NEEDED 60 tablet 0  . vitamin E 400 UNIT capsule Take 400 Units by mouth daily.      Marland Kitchen atorvastatin (LIPITOR) 20 MG tablet TAKE 1 TABLET BY MOUTH ONCE DAILY 90 tablet 0   No facility-administered medications prior to visit.     ROS See HPI  Objective:  BP 118/70   Pulse 74   Temp 97.7 F (36.5 C)   Ht 5\' 8"  (1.727 m)   Wt 180 lb (81.6 kg)   SpO2 98%   BMI 27.37 kg/m   BP Readings from Last 3 Encounters:  02/08/17 118/70  12/15/16 130/80  11/23/16 118/62    Wt Readings from Last 3 Encounters:    02/08/17 180 lb (81.6 kg)  12/15/16 189 lb 6.4 oz (85.9 kg)  11/23/16 188 lb 3.2 oz (85.4 kg)    Physical Exam  Constitutional: She is oriented to person, place, and time. No distress.  HENT:  Right Ear: External ear normal.  Left Ear: External ear normal.  Nose: Rhinorrhea present. No mucosal edema. Right sinus exhibits no maxillary sinus tenderness and no frontal sinus tenderness. Left sinus exhibits no maxillary sinus tenderness and no frontal sinus tenderness.  Mouth/Throat: Uvula is midline. Posterior oropharyngeal erythema present. No oropharyngeal exudate.  Neck: Normal range of motion. Neck supple. No thyromegaly present.  Cardiovascular: Normal rate and regular rhythm.  Pulmonary/Chest: Effort normal and breath sounds normal.  Musculoskeletal: She exhibits no edema.  Lymphadenopathy:    She has no cervical adenopathy.  Neurological: She is alert and oriented to person, place, and time.  Skin: Skin is warm and dry.  Psychiatric: She has a normal mood and affect. Her behavior is normal.  Vitals reviewed.   Lab Results  Component Value Date   WBC 8.5 05/18/2015   HGB 14.2 05/18/2015   HCT 42.3 05/18/2015   PLT 294.0 05/18/2015   GLUCOSE 99 12/12/2016   CHOL 162 11/23/2016   TRIG 170.0 (H) 11/23/2016   HDL 56.90 11/23/2016   LDLDIRECT 91.0 08/22/2016   LDLCALC 72 11/23/2016   ALT 20 11/23/2016   AST 22 11/23/2016   NA 138 12/12/2016   K 3.7 12/12/2016   CL 100 12/12/2016   CREATININE 0.67 12/12/2016   BUN 19 12/12/2016   CO2 29 12/12/2016   TSH 0.59 10/22/2014   INR 1.19 10/28/2009   HGBA1C 6.6 (H) 12/12/2016   MICROALBUR 0.9 07/07/2016    Mm Screening Breast Tomo Bilateral  Result Date: 01/12/2017 CLINICAL DATA:  Screening. EXAM: 2D DIGITAL SCREENING BILATERAL MAMMOGRAM WITH CAD AND ADJUNCT TOMO COMPARISON:  Previous exam(s). ACR Breast Density Category b: There are scattered areas of fibroglandular density. FINDINGS: There are no findings suspicious for  malignancy. Images were processed with CAD. IMPRESSION: No mammographic evidence of malignancy. A result letter of this screening mammogram will be mailed directly to the patient. RECOMMENDATION: Screening mammogram in one year. (Code:SM-B-01Y) BI-RADS CATEGORY  1: Negative. Electronically Signed   By: Franki Cabot M.D.   On: 01/12/2017 13:22    Assessment & Plan:   Seila was seen today for establish care and medication refill.  Diagnoses and all orders for this visit:  Hyperlipidemia LDL goal <70 -     atorvastatin (LIPITOR) 20 MG tablet; Take 1 tablet (20 mg total) by mouth daily.  Acute URI -  ipratropium (ATROVENT) 0.03 % nasal spray; Place 2 sprays into both nostrils 2 (two) times daily. Do not use for more than 5days. -     Dextromethorphan-Guaifenesin (DELSYM COUGH/CHEST CONGEST DM) 5-100 MG/5ML LIQD; Take 5 mLs by mouth 3 (three) times daily as needed. -     cetirizine (ZYRTEC) 10 MG tablet; Take 1 tablet (10 mg total) by mouth daily.   I have changed [place] atorvastatin. I am also having her start on ipratropium, Dextromethorphan-Guaifenesin, and cetirizine. Additionally, I am having her maintain her aspirin, acetaminophen, albuterol, vitamin C, vitamin E, Vitamin D-3, Garlic, Coenzyme I14, glucose blood, onetouch ultrasoft, hydrochlorothiazide, JENTADUETO, promethazine, ondansetron, dapagliflozin propanediol, glipiZIDE, Dulaglutide, lisinopril, fenofibrate micronized, potassium chloride SA, gabapentin, and cyanocobalamin.  Meds ordered this encounter  Medications  . ipratropium (ATROVENT) 0.03 % nasal spray    Sig: Place 2 sprays into both nostrils 2 (two) times daily. Do not use for more than 5days.    Dispense:  30 mL    Refill:  0    Order Specific Question:   Supervising Provider    Answer:   Lucille Passy [[id number]]  . Dextromethorphan-Guaifenesin (DELSYM COUGH/CHEST CONGEST DM) 5-100 MG/5ML LIQD    Sig: Take 5 mLs by mouth 3 (three) times daily as needed.     Dispense:  237 mL    Refill:  0    Order Specific Question:   Supervising Provider    Answer:   Lucille Passy [[id number]]  . cetirizine (ZYRTEC) 10 MG tablet    Sig: Take 1 tablet (10 mg total) by mouth daily.    Dispense:  30 tablet    Refill:  0    Order Specific Question:   Supervising Provider    Answer:   Lucille Passy [[id number]]  . atorvastatin (LIPITOR) 20 MG tablet    Sig: Take 1 tablet (20 mg total) by mouth daily.    Dispense:  90 tablet    Refill:  1    Order Specific Question:   Supervising Provider    Answer:   Lucille Passy [[id number]]    Follow-up: Return in about 6 months (around 08/08/2017) for DM and HTN, hyperlipidemia.  Wilfred Lacy, NP

## 2017-02-09 ENCOUNTER — Telehealth: Payer: Self-pay | Admitting: Nurse Practitioner

## 2017-02-09 NOTE — Telephone Encounter (Signed)
Copied from [place] [phone]. Topic: Quick Communication - See Telephone Encounter >> Feb 09, 2017  2:32 PM Conception Chancy, NT wrote: CRM for notification. See Telephone encounter for:  02/09/17.  Pt states she was seen yesterday by [facility] and she sent in all of the refills except hydrochlorothiazide. Pt states she needs that sent into the [facility] that is on file

## 2017-02-12 ENCOUNTER — Other Ambulatory Visit: Payer: Self-pay | Admitting: *Deleted

## 2017-02-12 DIAGNOSIS — I10 Essential (primary) hypertension: Secondary | ICD-10-CM

## 2017-02-12 MED ORDER — HYDROCHLOROTHIAZIDE 25 MG PO TABS: 25.0000 mg | ORAL_TABLET | Freq: Every day | ORAL | 1 refills | Status: DC

## 2017-02-12 NOTE — Telephone Encounter (Signed)
Per note 11/29- patient to continue HCTZ. Rx refilled.

## 2017-03-16 ENCOUNTER — Other Ambulatory Visit (INDEPENDENT_AMBULATORY_CARE_PROVIDER_SITE_OTHER): Payer: Medicare Other

## 2017-03-16 DIAGNOSIS — Z794 Long term (current) use of insulin: Secondary | ICD-10-CM

## 2017-03-16 DIAGNOSIS — E1165 Type 2 diabetes mellitus with hyperglycemia: Secondary | ICD-10-CM | POA: Diagnosis not present

## 2017-03-16 LAB — BASIC METABOLIC PANEL
BUN: 22 mg/dL (ref 6–23)
CALCIUM: 9.9 mg/dL (ref 8.4–10.5)
CO2: 26 mEq/L (ref 19–32)
Chloride: 100 mEq/L (ref 96–112)
Creatinine, Ser: 0.69 mg/dL (ref 0.40–1.20)
GFR: 88.98 mL/min (ref >60.00)
GLUCOSE: 95 mg/dL (ref 70–99)
Potassium: 4 mEq/L (ref 3.5–5.1)
SODIUM: 136 meq/L (ref 135–145)

## 2017-03-16 LAB — URINALYSIS, ROUTINE W REFLEX MICROSCOPIC
Bilirubin Urine: NEGATIVE
Hgb urine dipstick: NEGATIVE
KETONES UR: NEGATIVE
Leukocytes, UA: NEGATIVE
Nitrite: NEGATIVE
SPECIFIC GRAVITY, URINE: 1.02 (ref 1.000–1.030)
Total Protein, Urine: NEGATIVE
UROBILINOGEN UA: 0.2 (ref 0.0–1.0)
Urine Glucose: >=1000 — AB
pH: 8 (ref 5.0–8.0)

## 2017-03-16 LAB — MICROALBUMIN / CREATININE URINE RATIO
CREATININE, U: 86.1 mg/dL
MICROALB UR: 0.7 mg/dL (ref 0.0–1.9)
MICROALB/CREAT RATIO: 0.9 mg/g (ref 0.0–30.0)

## 2017-03-16 LAB — HEMOGLOBIN A1C: Hgb A1c MFr Bld: 6.6 % — ABNORMAL HIGH (ref 4.6–6.5)

## 2017-03-19 ENCOUNTER — Ambulatory Visit: Payer: Medicare Other | Admitting: Endocrinology

## 2017-03-19 ENCOUNTER — Encounter: Payer: Self-pay | Admitting: Endocrinology

## 2017-03-19 VITALS — BP 120/60 | HR 76 | Ht 68.0 in | Wt 185.0 lb

## 2017-03-19 DIAGNOSIS — E1169 Type 2 diabetes mellitus with other specified complication: Secondary | ICD-10-CM | POA: Diagnosis not present

## 2017-03-19 DIAGNOSIS — E669 Obesity, unspecified: Secondary | ICD-10-CM

## 2017-03-19 MED ORDER — ONDANSETRON HCL 4 MG PO TABS: 4.0000 mg | ORAL_TABLET | ORAL | 0 refills | Status: DC | PRN

## 2017-03-19 MED ORDER — DAPAGLIFLOZIN PRO-METFORMIN ER 5-1000 MG PO TB24: 2.0000 | ORAL_TABLET | Freq: Every day | ORAL | 3 refills | Status: DC

## 2017-03-19 MED ORDER — GLIPIZIDE ER 2.5 MG PO TB24: 2.5000 mg | ORAL_TABLET | Freq: Every day | ORAL | 3 refills | Status: DC

## 2017-03-19 NOTE — Progress Notes (Signed)
Patient ID: Sandra Ray, female   DOB: 21-Dec-1945, 72 y.o.   MRN: [id number]            Reason for Appointment: Follow-up for Type 2 Diabetes  Referring physician: Etter Sjogren   History of Present Illness:          Date of diagnosis of type 2 diabetes mellitus: 1996       Background history:   She had a high blood sugar on lab work which diagnosed her diabetes and she was most likely treated with metformin for a few years She has also taking glipizide for several years She was tried on Actos which was started because of significant weight gain. Over the last few years she has been taking either  Januvia or Tradjenta with her metformin in combination with fair control Her A1c has been below 8% only twice in the last 2-3 years  Recent history:   Her baseline A1c was 8.4% on her initial consultation and now it is again 6.6  Non-insulin hypoglycemic drugs: Glipizide ER 5 mg, Trulicity 6.37 mg weekly, Farxiga 10 mg in the morning, Jentadueto 2.5 mg twice a day  Current management, blood sugar patterns and problems identified:  She still has not finished her Jentadueto, was supposed to switch to Farxiga/metformin combination after her last visit   Although she has the same A1c her fasting blood sugars are relatively lower than before  She does not think she has any symptoms of hypoglycemia now; only has a snack lichen apple in the afternoon usually and not otherwise snacking.  Because of family issues she has not been to exercise  Also not checking readings after meals as she had done some before  She has only occasional symptoms of nausea which she thinks is from metformin  Currently taking Farxiga at dinnertime       Side effects from medications have been: Weight gain from Actos, nausea from high dose metformin  Compliance with the medical regimen: Good  Glucose monitoring:  done <1 times a day         Glucometer: One Touch.      Blood Glucose readings by time of day and  averages from meter   FASTING range 82-127 with median 91  Nonfasting blood sugars not done  Self-care: The diet that the patient has been following is: tries to limit fried food.     Typical meal intake: Breakfast is cereal, egg, almond milk,  lunch [place] burger and salad, dinner is soup and crackers and vegetables.  Snacks are usually small apple                Dietician visit, most recent: 07/13/16               Exercise: less walking recently  Weight history: Previous range 200-254  Wt Readings from Last 3 Encounters:  03/19/17 185 lb (83.9 kg)  02/08/17 180 lb (81.6 kg)  12/15/16 189 lb 6.4 oz (85.9 kg)    Glycemic control:   Lab Results  Component Value Date   HGBA1C 6.6 (H) 03/16/2017   HGBA1C 6.6 (H) 12/12/2016   HGBA1C 6.7 (H) 09/07/2016   Lab Results  Component Value Date   MICROALBUR 0.7 03/16/2017   LDLCALC 72 11/23/2016   CREATININE 0.69 03/16/2017   Lab Results  Component Value Date   MICRALBCREAT 0.9 03/16/2017    Lab Results  Component Value Date   FRUCTOSAMINE 328 (H) 07/07/2016      Allergies as  of 03/19/2017      Reactions   Penicillins    Causes yeast infections   Amoxicillin Other (See Comments)   Pt states it causes her a yeast infection    Erythromycin    Passed out while taking      Medication List        Accurate as of 03/19/17 10:49 AM. Always use your most recent med list.          acetaminophen 650 MG CR tablet Commonly known as:  TYLENOL Take 1,300 mg by mouth daily.   aspirin 81 MG tablet Take 81 mg by mouth daily.   atorvastatin 20 MG tablet Commonly known as:  LIPITOR Take 1 tablet (20 mg total) by mouth daily.   cetirizine 10 MG tablet Commonly known as:  ZYRTEC Take 1 tablet (10 mg total) by mouth daily.   Coenzyme Q10 100 MG Tabs Take 400 mg by mouth daily.   cyanocobalamin 500 MCG tablet Take 500 mcg by mouth daily.   dapagliflozin propanediol 10 MG Tabs tablet Commonly known as:  FARXIGA Take 10 mg  by mouth daily.   Dextromethorphan-Guaifenesin 5-100 MG/5ML Liqd Commonly known as:  DELSYM COUGH/CHEST CONGEST DM Take 5 mLs by mouth 3 (three) times daily as needed.   Dulaglutide 0.75 MG/0.5ML Sopn Commonly known as:  TRULICITY Inject 6.57 mg into the skin once a week.   fenofibrate micronized 130 MG capsule Commonly known as:  ANTARA TAKE 1 CAPSULE BY MOUTH EVERY DAY   gabapentin 300 MG capsule Commonly known as:  NEURONTIN Take 1 capsule (300 mg total) 3 (three) times daily by mouth.   Garlic 8469 MG Caps Take 1 capsule by mouth daily.   glipiZIDE 5 MG 24 hr tablet Commonly known as:  GLUCOTROL XL Take 1 tablet (5 mg total) by mouth daily with breakfast.   glucose blood test strip Commonly known as:  ONE TOUCH ULTRA TEST Use as instructed once a day.  DX code E11.40   hydrochlorothiazide 25 MG tablet Commonly known as:  HYDRODIURIL Take 1 tablet (25 mg total) by mouth daily.   ipratropium 0.03 % nasal spray Commonly known as:  ATROVENT Place 2 sprays into both nostrils 2 (two) times daily. Do not use for more than 5days.   JENTADUETO 2.07-998 MG Tabs Generic drug:  Linagliptin-Metformin HCl Take 1 tablet by mouth 2 (two) times daily.   lisinopril 5 MG tablet Commonly known as:  PRINIVIL,ZESTRIL TAKE 1 TABLET BY MOUTH ONCE DAILY   ondansetron 4 MG tablet Commonly known as:  ZOFRAN Take 1 tablet (4 mg total) by mouth every 8 (eight) hours as needed for nausea or vomiting.   onetouch ultrasoft lancets Use as instructed once day.  E11.40   potassium chloride SA 20 MEQ tablet Commonly known as:  KLOR-CON M20 Take 1 tablet (20 mEq total) daily by mouth.   promethazine 25 MG tablet Commonly known as:  PHENERGAN TAKE ONE TABLET BY MOUTH 2 TIMES DAILY AS NEEDED   VENTOLIN HFA 108 (90 Base) MCG/ACT inhaler Generic drug:  albuterol Inhale 2 puffs into the lungs every 6 (six) hours as needed.   vitamin C 500 MG tablet Commonly known as:  ASCORBIC ACID Take  500 mg by mouth daily.   Vitamin D-3 5000 units Tabs Take by mouth daily.   vitamin E 400 UNIT capsule Take 400 Units by mouth daily.       Allergies:  Allergies  Allergen Reactions  . Penicillins  Causes yeast infections  . Amoxicillin Other (See Comments)    Pt states it causes her a yeast infection   . Erythromycin     Passed out while taking    Past Medical History:  Diagnosis Date  . Arthritis   . Breast abscess    right breast  . Carpal tunnel syndrome   . Degenerative disc disease   . Diabetes mellitus type II   . Eczema of hand   . Hx of colonic polyps 12/02/2014   2004 - 2 diminutive polyps   . Hyperlipidemia   . Hypertension   . Metatarsal deformity   . Shingles   . Tubular adenoma of colon     Past Surgical History:  Procedure Laterality Date  . ABDOMINAL HYSTERECTOMY  1981  . ABSCESS DRAINAGE Right    breast  . APPENDECTOMY  1966  . BREAST BIOPSY Left   . carpel tunnel Right   . CESAREAN SECTION     x2  . COLONOSCOPY    . LUMBAR LAMINECTOMY     [id number]. 2011 L4,L5,S1  . Right rotator cuff repair 09/2015    . TUBAL LIGATION      Family History  Problem Relation Age of Onset  . Hypertension Father   . Diabetes Mother   . Stroke Mother   . Hypertension Mother   . High Cholesterol Mother   . Heart disease Mother   . Diabetes Brother   . Breast cancer Daughter   . Breast cancer Maternal Aunt   . Breast cancer Sister   . Colon polyps Brother     Social History:  reports that  has never smoked. she has never used smokeless tobacco. She reports that she does not drink alcohol or use drugs.   Review of Systems   Lipid history: Has had high triglycerides and LDL, LDL below 100 Is on combination of Lipitor and fenofibrate   Lab Results  Component Value Date   CHOL 162 11/23/2016   HDL 56.90 11/23/2016   LDLCALC 72 11/23/2016   LDLDIRECT 91.0 08/22/2016   TRIG 170.0 (H) 11/23/2016   CHOLHDL 3 11/23/2016             Hypertension:Mild and controlled with 5 mg Lisinopril and 25 mg HCTZ prescribed by  PCP  Most recent eye exam was 04/2016  Most recent foot exam:06/2016 Has evidence of neuropathy on exam  She has very occasional nausea, uses (when necessary   LABS:  Lab on 03/16/2017  Component Date Value Ref Range Status  . Sodium 03/16/2017 136  135 - 145 mEq/L Final  . Potassium 03/16/2017 4.0  3.5 - 5.1 mEq/L Final  . Chloride 03/16/2017 100  96 - 112 mEq/L Final  . CO2 03/16/2017 26  19 - 32 mEq/L Final  . Glucose, Bld 03/16/2017 95  70 - 99 mg/dL Final  . BUN 03/16/2017 22  6 - 23 mg/dL Final  . Creatinine, Ser 03/16/2017 0.69  0.40 - 1.20 mg/dL Final  . Calcium 03/16/2017 9.9  8.4 - 10.5 mg/dL Final  . GFR 03/16/2017 88.98  >60.00 mL/min Final  . Hgb A1c MFr Bld 03/16/2017 6.6* 4.6 - 6.5 % Final   Glycemic Control Guidelines for People with Diabetes:Non Diabetic:  <6%Goal of Therapy: <7%Additional Action Suggested:  >8%   . Color, Urine 03/16/2017 YELLOW  Yellow;Lt. Yellow Final  . APPearance 03/16/2017 CLEAR  Clear Final  . Specific Gravity, Urine 03/16/2017 1.020  1.000 - 1.030 Final  . pH 03/16/2017  8.0  5.0 - 8.0 Final  . Total Protein, Urine 03/16/2017 NEGATIVE  Negative Final  . Urine Glucose 03/16/2017 >=1000* Negative Final  . Ketones, ur 03/16/2017 NEGATIVE  Negative Final  . Bilirubin Urine 03/16/2017 NEGATIVE  Negative Final  . Hgb urine dipstick 03/16/2017 NEGATIVE  Negative Final  . Urobilinogen, UA 03/16/2017 0.2  0.0 - 1.0 Final  . Leukocytes, UA 03/16/2017 NEGATIVE  Negative Final  . Nitrite 03/16/2017 NEGATIVE  Negative Final  . WBC, UA 03/16/2017 0-2/hpf  0-2/hpf Final  . RBC / HPF 03/16/2017 0-2/hpf  0-2/hpf Final  . Squamous Epithelial / LPF 03/16/2017 Rare(0-4/hpf)  Rare(0-4/hpf) Final  . Microalb, Ur 03/16/2017 0.7  0.0 - 1.9 mg/dL Final  . Creatinine,U 03/16/2017 86.1  mg/dL Final  . Microalb Creat Ratio 03/16/2017 0.9  0.0 - 30.0 mg/g Final    Physical  Examination:  BP 120/60   Pulse 76   Ht 5\' 8"  (1.727 m)   Wt 185 lb (83.9 kg)   SpO2 98%   BMI 28.13 kg/m    ASSESSMENT:  Diabetes type 2, uncontrolled   See history of present illness for detailed discussion of current diabetes management, blood sugar patterns and problems identified   Her A1c is again excellent at 6.6 Although she has not lost any weight her blood sugars are excellent and below 100 on an average in the morning Most likely she needs less glipizide even though she is not having hypoglycemia currently  She is benefiting with better control from using [place] as well as Trulicity She tries to do well with her diet Also she can do better with exercise   PLAN:     she will try to check more readings after dinner and some after breakfast and lunch  Reduce glipizide ER down to 2.5 mg  She is to try and walk or do other exercises, she thinks she can start going to the gym   She will use Xigduo 07/998, 2 tablets daily instead of Jentadueto    There are no Patient Instructions on file for this visit.   Elayne Snare 03/19/2017, 10:49 AM   Note: This office note was prepared with Dragon voice recognition system technology. Any transcriptional errors that result from this process are unintentional.

## 2017-03-19 NOTE — Patient Instructions (Signed)
Check blood sugars on waking up  2-3/7  Also check blood sugars about 2 hours after a meal and do this after different meals by rotation  Recommended blood sugar levels on waking up is 90-130 and about 2 hours after meal is 130-160  Please bring your blood sugar monitor to each visit, thank you  2 new rxs

## 2017-03-26 ENCOUNTER — Other Ambulatory Visit: Payer: Self-pay | Admitting: Family Medicine

## 2017-03-26 DIAGNOSIS — E785 Hyperlipidemia, unspecified: Secondary | ICD-10-CM

## 2017-03-26 DIAGNOSIS — I10 Essential (primary) hypertension: Secondary | ICD-10-CM

## 2017-03-30 ENCOUNTER — Encounter: Payer: Self-pay | Admitting: Nurse Practitioner

## 2017-03-30 ENCOUNTER — Ambulatory Visit (INDEPENDENT_AMBULATORY_CARE_PROVIDER_SITE_OTHER): Payer: Medicare Other | Admitting: Nurse Practitioner

## 2017-03-30 VITALS — BP 150/68 | HR 79 | Temp 98.0°F | Ht 68.0 in | Wt 184.0 lb

## 2017-03-30 DIAGNOSIS — J0100 Acute maxillary sinusitis, unspecified: Secondary | ICD-10-CM

## 2017-03-30 MED ORDER — OXYMETAZOLINE HCL 0.05 % NA SOLN: 1.0000 | Freq: Two times a day (BID) | NASAL | 0 refills | Status: DC

## 2017-03-30 MED ORDER — SALINE SPRAY 0.65 % NA SOLN
1.0000 | NASAL | 0 refills | Status: DC | PRN
Start: 2017-03-30 — End: 2017-12-14

## 2017-03-30 MED ORDER — BENZONATATE 100 MG PO CAPS: 100.0000 mg | ORAL_CAPSULE | Freq: Three times a day (TID) | ORAL | 0 refills | Status: DC | PRN

## 2017-03-30 MED ORDER — FLUTICASONE PROPIONATE 50 MCG/ACT NA SUSP: 2.0000 | Freq: Every day | NASAL | 0 refills | Status: DC

## 2017-03-30 MED ORDER — LEVOFLOXACIN 500 MG PO TABS: 500.0000 mg | ORAL_TABLET | Freq: Every day | ORAL | 0 refills | Status: DC

## 2017-03-30 NOTE — Patient Instructions (Addendum)
[facility] Instructions: Flonase and Afrin use: apply 1spray of afrin in each nare, wait 31mins, then apply 2sprays of flonase in each nare. Use both nasal spray consecutively x 3days, then flonase only for at least 14days.  Encourage adequate oral hydration.   Avoid decongestants if you have high blood pressure. Use" Delsym" or" Robitussin" cough syrup varietis for cough.  You can use plain "Tylenol" or "Advi"l for fever, chills and achyness.   Anticipatory guidance provided about drug drug interaction between glipizide and levaquin. Advised to to take medication with food and 4hrs apart from each other. She verbalized understanding.

## 2017-03-30 NOTE — Progress Notes (Signed)
Subjective:  Patient ID: Sandra Ray, female    DOB: 02/12/1946  Age: 72 y.o. MRN: [id number]  CC: Cough (coughing,yellow mucus,headache/5 days)  Sinusitis  This is a new problem. The current episode started 1 to 4 weeks ago. The problem has been waxing and waning since onset. There has been no fever. Associated symptoms include congestion, coughing, ear pain, headaches, a hoarse voice, sinus pressure and a sore throat. Pertinent negatives include no chills, shortness of breath or swollen glands. Past treatments include acetaminophen and oral decongestants. The treatment provided no relief.   Outpatient Medications Prior to Visit  Medication Sig Dispense Refill  . acetaminophen (TYLENOL) 650 MG CR tablet Take 1,300 mg by mouth daily.     Marland Kitchen albuterol (VENTOLIN HFA) 108 (90 BASE) MCG/ACT inhaler Inhale 2 puffs into the lungs every 6 (six) hours as needed.      . Ascorbic Acid (VITAMIN C) 500 MG tablet Take 500 mg by mouth daily.      Marland Kitchen aspirin 81 MG tablet Take 81 mg by mouth daily.      Marland Kitchen atorvastatin (LIPITOR) 20 MG tablet Take 1 tablet (20 mg total) by mouth daily. 90 tablet 1  . Cholecalciferol (VITAMIN D-3) 5000 UNITS TABS Take by mouth daily.     . Coenzyme Q10 100 MG TABS Take 400 mg by mouth daily.    . cyanocobalamin 500 MCG tablet Take 500 mcg by mouth daily.    . dapagliflozin propanediol (FARXIGA) 10 MG TABS tablet Take 10 mg by mouth daily. 90 tablet 0  . Dapagliflozin-Metformin HCl ER (XIGDUO XR) 07-998 MG TB24 Take 2 tablets by mouth daily with breakfast. 60 tablet 3  . Dulaglutide (TRULICITY) 3.38 SN/0.5LZ SOPN Inject 0.75 mg into the skin once a week. 4 pen 4  . fenofibrate micronized (ANTARA) 130 MG capsule TAKE 1 CAPSULE BY MOUTH EVERY DAY 90 capsule 1  . gabapentin (NEURONTIN) 300 MG capsule Take 1 capsule (300 mg total) 3 (three) times daily by mouth. 270 capsule 1  . Garlic 7673 MG CAPS Take 1 capsule by mouth daily.     Marland Kitchen glipiZIDE (GLUCOTROL XL) 2.5 MG 24 hr tablet  Take 1 tablet (2.5 mg total) by mouth daily with breakfast. 30 tablet 3  . glucose blood (ONE TOUCH ULTRA TEST) test strip Use as instructed once a day.  DX code E11.40 100 each 5  . hydrochlorothiazide (HYDRODIURIL) 25 MG tablet Take 1 tablet (25 mg total) by mouth daily. 90 tablet 1  . Lancets (ONETOUCH ULTRASOFT) lancets Use as instructed once day.  E11.40 100 each 5  . lisinopril (PRINIVIL,ZESTRIL) 5 MG tablet TAKE 1 TABLET BY MOUTH ONCE DAILY 90 tablet 1  . ondansetron (ZOFRAN) 4 MG tablet Take 1 tablet (4 mg total) by mouth as needed for nausea or vomiting. 20 tablet 0  . potassium chloride SA (KLOR-CON M20) 20 MEQ tablet Take 1 tablet (20 mEq total) daily by mouth. 90 tablet 1  . promethazine (PHENERGAN) 25 MG tablet TAKE ONE TABLET BY MOUTH 2 TIMES DAILY AS NEEDED 60 tablet 0  . vitamin E 400 UNIT capsule Take 400 Units by mouth daily.      . cetirizine (ZYRTEC) 10 MG tablet Take 1 tablet (10 mg total) by mouth daily. 30 tablet 0  . Dextromethorphan-Guaifenesin (DELSYM COUGH/CHEST CONGEST DM) 5-100 MG/5ML LIQD Take 5 mLs by mouth 3 (three) times daily as needed. 237 mL 0  . ipratropium (ATROVENT) 0.03 % nasal spray Place 2 sprays  into both nostrils 2 (two) times daily. Do not use for more than 5days. 30 mL 0   No facility-administered medications prior to visit.     ROS See HPI  Objective:  BP (!) 150/68   Pulse 79   Temp 98 F (36.7 C)   Ht 5\' 8"  (1.727 m)   Wt 184 lb (83.5 kg)   SpO2 96%   BMI 27.98 kg/m   BP Readings from Last 3 Encounters:  03/30/17 (!) 150/68  03/19/17 120/60  02/08/17 118/70    Wt Readings from Last 3 Encounters:  03/30/17 184 lb (83.5 kg)  03/19/17 185 lb (83.9 kg)  02/08/17 180 lb (81.6 kg)    Physical Exam  Constitutional: She is oriented to person, place, and time.  HENT:  Right Ear: External ear and ear canal normal. A middle ear effusion is present.  Left Ear: External ear and ear canal normal. A middle ear effusion is present.    Nose: Mucosal edema and rhinorrhea present. Right sinus exhibits maxillary sinus tenderness. Right sinus exhibits no frontal sinus tenderness. Left sinus exhibits maxillary sinus tenderness. Left sinus exhibits no frontal sinus tenderness.  Mouth/Throat: Uvula is midline. No trismus in the jaw. Posterior oropharyngeal erythema present. No oropharyngeal exudate.  Eyes: No scleral icterus.  Neck: Normal range of motion. Neck supple.  Cardiovascular: Normal rate and normal heart sounds.  Pulmonary/Chest: Effort normal and breath sounds normal.  Musculoskeletal: She exhibits no edema.  Lymphadenopathy:    She has no cervical adenopathy.  Neurological: She is alert and oriented to person, place, and time.  Vitals reviewed.   Lab Results  Component Value Date   WBC 8.5 05/18/2015   HGB 14.2 05/18/2015   HCT 42.3 05/18/2015   PLT 294.0 05/18/2015   GLUCOSE 95 03/16/2017   CHOL 162 11/23/2016   TRIG 170.0 (H) 11/23/2016   HDL 56.90 11/23/2016   LDLDIRECT 91.0 08/22/2016   LDLCALC 72 11/23/2016   ALT 20 11/23/2016   AST 22 11/23/2016   NA 136 03/16/2017   K 4.0 03/16/2017   CL 100 03/16/2017   CREATININE 0.69 03/16/2017   BUN 22 03/16/2017   CO2 26 03/16/2017   TSH 0.59 10/22/2014   INR 1.19 10/28/2009   HGBA1C 6.6 (H) 03/16/2017   MICROALBUR 0.7 03/16/2017    Mm Screening Breast Tomo Bilateral  Result Date: 01/12/2017 CLINICAL DATA:  Screening. EXAM: 2D DIGITAL SCREENING BILATERAL MAMMOGRAM WITH CAD AND ADJUNCT TOMO COMPARISON:  Previous exam(s). ACR Breast Density Category b: There are scattered areas of fibroglandular density. FINDINGS: There are no findings suspicious for malignancy. Images were processed with CAD. IMPRESSION: No mammographic evidence of malignancy. A result letter of this screening mammogram will be mailed directly to the patient. RECOMMENDATION: Screening mammogram in one year. (Code:SM-B-01Y) BI-RADS CATEGORY  1: Negative. Electronically Signed   By: Franki Cabot M.D.   On: 01/12/2017 13:22    Assessment & Plan:   Jomarie was seen today for cough.  Diagnoses and all orders for this visit:  Acute non-recurrent maxillary sinusitis -     levofloxacin (LEVAQUIN) 500 MG tablet; Take 1 tablet (500 mg total) by mouth daily. -     sodium chloride (OCEAN) 0.65 % SOLN nasal spray; Place 1 spray into both nostrils as needed for congestion. -     fluticasone (FLONASE) 50 MCG/ACT nasal spray; Place 2 sprays into both nostrils daily. -     oxymetazoline (AFRIN NASAL SPRAY) 0.05 % nasal spray; Place  1 spray into both nostrils 2 (two) times daily. Use only for 3days, then stop -     benzonatate (TESSALON) 100 MG capsule; Take 1 capsule (100 mg total) by mouth 3 (three) times daily as needed for cough.   I have discontinued Modene Andy. Rodger's ipratropium, Dextromethorphan-Guaifenesin, and cetirizine. I am also having her start on levofloxacin, sodium chloride, fluticasone, oxymetazoline, and benzonatate. Additionally, I am having her maintain her aspirin, acetaminophen, albuterol, vitamin C, vitamin E, Vitamin D-3, Garlic, Coenzyme B44, glucose blood, onetouch ultrasoft, promethazine, dapagliflozin propanediol, Dulaglutide, fenofibrate micronized, potassium chloride SA, gabapentin, cyanocobalamin, atorvastatin, hydrochlorothiazide, glipiZIDE, Dapagliflozin-Metformin HCl ER, ondansetron, and lisinopril.  Meds ordered this encounter  Medications  . levofloxacin (LEVAQUIN) 500 MG tablet    Sig: Take 1 tablet (500 mg total) by mouth daily.    Dispense:  7 tablet    Refill:  0    Order Specific Question:   Supervising Provider    Answer:   Lucille Passy [[id number]]  . sodium chloride (OCEAN) 0.65 % SOLN nasal spray    Sig: Place 1 spray into both nostrils as needed for congestion.    Dispense:  15 mL    Refill:  0    Order Specific Question:   Supervising Provider    Answer:   Lucille Passy [[id number]]  . fluticasone (FLONASE) 50 MCG/ACT nasal spray    Sig: Place 2  sprays into both nostrils daily.    Dispense:  16 g    Refill:  0    Order Specific Question:   Supervising Provider    Answer:   Lucille Passy [[id number]]  . oxymetazoline (AFRIN NASAL SPRAY) 0.05 % nasal spray    Sig: Place 1 spray into both nostrils 2 (two) times daily. Use only for 3days, then stop    Dispense:  30 mL    Refill:  0    Order Specific Question:   Supervising Provider    Answer:   Lucille Passy [[id number]]  . benzonatate (TESSALON) 100 MG capsule    Sig: Take 1 capsule (100 mg total) by mouth 3 (three) times daily as needed for cough.    Dispense:  20 capsule    Refill:  0    Order Specific Question:   Supervising Provider    Answer:   Lucille Passy [[id number]]    Follow-up: No Follow-up on file.  Wilfred Lacy, NP

## 2017-04-11 ENCOUNTER — Telehealth: Payer: Self-pay | Admitting: Endocrinology

## 2017-04-11 NOTE — Telephone Encounter (Signed)
Patient needs paper work forms for Trulicity to go to [employer] it can be faxed, please fax Also paperwork for Xigduo XR 07-998 mg needs to go to [employer]  If these forms cannot be faxed,  patient will pick up the forms herself Patient is bringing in above forms today 04/11/2017

## 2017-04-13 NOTE — Telephone Encounter (Signed)
Forms on [employer]

## 2017-04-18 ENCOUNTER — Telehealth: Payer: Self-pay

## 2017-04-18 NOTE — Telephone Encounter (Signed)
I called patient and she stated that she is trying to get assistance for the Xigduo XR 07-998 from [place] and she is asking if you can prescribe some Metformin to go with the [place] 10mg  tablets that she has until this has been approved for her. Please advise if Metformin is okay and what dosage to go with the [place].

## 2017-04-18 NOTE — Telephone Encounter (Signed)
Metformin ER 500 mg, 4 tablets daily

## 2017-04-18 NOTE — Telephone Encounter (Signed)
Pt is checking status on paperwork that was given to dr.    Please advise

## 2017-04-19 ENCOUNTER — Other Ambulatory Visit: Payer: Self-pay

## 2017-04-19 MED ORDER — METFORMIN HCL ER 500 MG PO TB24: 2000.0000 mg | ORAL_TABLET | Freq: Every day | ORAL | 1 refills | Status: DC

## 2017-04-19 MED ORDER — DULAGLUTIDE 0.75 MG/0.5ML ~~LOC~~ SOAJ: 0.7500 mg | SUBCUTANEOUS | 1 refills | Status: DC

## 2017-04-19 NOTE — Telephone Encounter (Signed)
Called patient and let her know that I have sent in a months supply of the Metformin and Trulicity that she requested and I have the paperwork from [place] and will fax that today.

## 2017-04-20 NOTE — Telephone Encounter (Signed)
Please check for forms

## 2017-04-20 NOTE — Telephone Encounter (Signed)
I have signed all the forms

## 2017-04-20 NOTE — Telephone Encounter (Signed)
Please advise on paperwork mentioned below

## 2017-04-23 NOTE — Telephone Encounter (Signed)
I have just faxed the paperwork for the Xigduo to [phone] to [employer] ([place] Patient Assistance Program)

## 2017-04-23 NOTE — Telephone Encounter (Signed)
I have faxed the [employer] paperwork to [phone].

## 2017-04-24 ENCOUNTER — Other Ambulatory Visit: Payer: Self-pay

## 2017-04-24 MED ORDER — DULAGLUTIDE 0.75 MG/0.5ML ~~LOC~~ SOAJ: 0.7500 mg | SUBCUTANEOUS | 3 refills | Status: DC

## 2017-04-25 ENCOUNTER — Telehealth: Payer: Self-pay

## 2017-04-25 NOTE — Telephone Encounter (Signed)
Called patient and let her know that I have confirmation receipts from the faxes that I have sent to the companies provided. I am going to send again for her today.

## 2017-04-25 NOTE — Telephone Encounter (Signed)
error 

## 2017-04-25 NOTE — Telephone Encounter (Addendum)
Patient called today requesting Megan call her back and let her know what is going on with paperwork that was faxed on 04/19/16 for the patient assistance through [place]- patient states she has called and was told they have not received any paperwork from this office

## 2017-04-26 NOTE — Telephone Encounter (Signed)
Called patient and left a voice message to let her know that I have re-faxed her paperwork to [employer] ([facility]) and the [employer] and she has her original copies up front that she can pick up.

## 2017-05-03 ENCOUNTER — Other Ambulatory Visit: Payer: Self-pay

## 2017-05-03 MED ORDER — METFORMIN HCL ER 500 MG PO TB24: 2000.0000 mg | ORAL_TABLET | Freq: Every day | ORAL | 2 refills | Status: DC

## 2017-05-07 ENCOUNTER — Telehealth: Payer: Self-pay | Admitting: *Deleted

## 2017-05-15 ENCOUNTER — Telehealth: Payer: Self-pay | Admitting: Nurse Practitioner

## 2017-05-15 MED ORDER — FENOFIBRATE 145 MG PO TABS: 145.0000 mg | ORAL_TABLET | Freq: Every day | ORAL | 1 refills | Status: DC

## 2017-05-15 NOTE — Telephone Encounter (Signed)
Ok to change. Send 90tabs with 1 refill

## 2017-05-15 NOTE — Telephone Encounter (Signed)
Copied from [place]. Topic: Quick Communication - See Telephone Encounter >> May 15, 2017 10:23 AM Synthia Innocent wrote: CRM for notification. See Telephone encounter for: insurance will not cover fenofibrate micronized (ANTARA) 130 MG capsule, they will cover 145 mg tablet. Can this be done or does med need to be changed. Please advise [facility] on [address] 05/15/17.

## 2017-05-15 NOTE — Telephone Encounter (Signed)
rx changed and resend as requested.

## 2017-05-18 ENCOUNTER — Telehealth: Payer: Self-pay

## 2017-05-18 ENCOUNTER — Other Ambulatory Visit: Payer: Self-pay

## 2017-05-18 MED ORDER — CANAGLIFLOZIN-METFORMIN HCL ER 150-1000 MG PO TB24: 2.0000 | ORAL_TABLET | Freq: Every day | ORAL | 2 refills | Status: DC

## 2017-05-18 NOTE — Telephone Encounter (Signed)
oredering Invokamet per

## 2017-05-21 ENCOUNTER — Telehealth: Payer: Self-pay | Admitting: Endocrinology

## 2017-05-21 NOTE — Telephone Encounter (Signed)
[employer] is calling about a paper that was faxed over to [place].  Application for Az and me   Rachael  [phone]

## 2017-05-24 ENCOUNTER — Ambulatory Visit: Payer: Medicare Other | Admitting: Family Medicine

## 2017-06-11 ENCOUNTER — Other Ambulatory Visit: Payer: Self-pay | Admitting: Endocrinology

## 2017-06-11 NOTE — Telephone Encounter (Signed)
This has been done.

## 2017-06-12 ENCOUNTER — Telehealth: Payer: Self-pay | Admitting: Endocrinology

## 2017-06-12 NOTE — Telephone Encounter (Signed)
Read over pt last visit and doctor notes and called Rachael back and informed her that physician does want pt on both medications

## 2017-06-12 NOTE — Telephone Encounter (Signed)
dapagliflozin propanediol (FARXIGA) 10 MG TABS tablet    Dapagliflozin-Metformin HCl ER (XIGDUO XR) 07-998 MG TB24   [facility] is calling in regards to patients medication . And needs some clarification if the patient is taking both of these    [phone] [facility]

## 2017-06-15 ENCOUNTER — Other Ambulatory Visit: Payer: Medicare Other

## 2017-06-18 ENCOUNTER — Telehealth: Payer: Self-pay | Admitting: Endocrinology

## 2017-06-18 ENCOUNTER — Ambulatory Visit: Payer: Medicare Other | Admitting: Endocrinology

## 2017-06-18 NOTE — Telephone Encounter (Signed)
Dapagliflozin-Metformin HCl ER (XIGDUO XR) 07-998 MG TB24  Patient stated this needs to be a 90 day supply and was sent as a 30 day .   She states this is suppose to be sent [facility].   Fax-[phone] Phone. [phone]

## 2017-06-19 ENCOUNTER — Telehealth: Payer: Self-pay | Admitting: Endocrinology

## 2017-06-19 ENCOUNTER — Other Ambulatory Visit: Payer: Self-pay

## 2017-06-19 MED ORDER — DAPAGLIFLOZIN PRO-METFORMIN ER 5-1000 MG PO TB24: 2.0000 | ORAL_TABLET | Freq: Every day | ORAL | 2 refills | Status: DC

## 2017-06-19 NOTE — Telephone Encounter (Signed)
Medication sent to pharmacy per pt request.

## 2017-06-20 NOTE — Telephone Encounter (Signed)
error 

## 2017-06-21 ENCOUNTER — Telehealth: Payer: Self-pay | Admitting: *Deleted

## 2017-06-21 NOTE — Telephone Encounter (Signed)
Completed Xigduo PA  form faxed to [facility].

## 2017-06-22 ENCOUNTER — Other Ambulatory Visit: Payer: Self-pay

## 2017-06-22 MED ORDER — GLIPIZIDE ER 2.5 MG PO TB24: 2.5000 mg | ORAL_TABLET | Freq: Every day | ORAL | 3 refills | Status: DC

## 2017-06-25 ENCOUNTER — Other Ambulatory Visit: Payer: Self-pay

## 2017-06-25 ENCOUNTER — Telehealth: Payer: Self-pay | Admitting: Endocrinology

## 2017-06-25 MED ORDER — DAPAGLIFLOZIN PRO-METFORMIN ER 5-1000 MG PO TB24: 2.0000 | ORAL_TABLET | Freq: Every day | ORAL | 2 refills | Status: DC

## 2017-06-25 NOTE — Telephone Encounter (Signed)
This has been sent

## 2017-06-25 NOTE — Telephone Encounter (Signed)
Pharmacy states that they never received RX for Xigduo. Please send RX for the above medication-90 day supply to [facility] Fax# [phone] Also fax to patient's home # [phone] (call 1st to tell them you are sending-phone is also the fax)

## 2017-06-26 NOTE — Telephone Encounter (Signed)
Pt was notified that Items cannot be faxed to personal homes. Pt verbalized understanding.

## 2017-07-05 ENCOUNTER — Telehealth: Payer: Self-pay | Admitting: Endocrinology

## 2017-07-05 NOTE — Telephone Encounter (Signed)
Patient stated [facility] told patient the fax for the prescription Xig/duo that was sent to them was not accepted.   Patient need a new prescription (Xig/duo) original script signed in ink.  Please let patient know when it is done they will pick it up and mail it off.  P# [phone]

## 2017-07-11 DIAGNOSIS — N83209 Unspecified ovarian cyst, unspecified side: Secondary | ICD-10-CM

## 2017-07-11 HISTORY — DX: Unspecified ovarian cyst, unspecified side: N83.209

## 2017-07-12 NOTE — Telephone Encounter (Signed)
Spoke with rep from [facility]. The rep stated that a faxed copy of the prescription was not sufficient. [facility] wants the prescription that Dr. Dwyane Dee signed. They also stated they needed two forms that go with the rx. The forms are being refaxed in order to obtain the MD signature. Rep stated that the fax would come through in approx. 24 hours. The representative stated that the forms and RX must be mailed to   [place] [address].   Once forms and RX is completed, patient will be called so she can come pick up info to mail.

## 2017-07-17 NOTE — Telephone Encounter (Signed)
Called patient to notify her that Dr. Dwyane Dee has finished her paperwork and the paperwork along with the prescription can be picked up at will. Patient stated that she or her husband will pick it up tomorrow.

## 2017-07-20 DIAGNOSIS — M25552 Pain in left hip: Secondary | ICD-10-CM | POA: Insufficient documentation

## 2017-07-23 ENCOUNTER — Telehealth: Payer: Self-pay | Admitting: Nurse Practitioner

## 2017-07-23 ENCOUNTER — Other Ambulatory Visit: Payer: Medicare Other

## 2017-07-23 NOTE — Telephone Encounter (Signed)
Copied from [place] [phone]. Topic: Quick Communication - See Telephone Encounter >> Jul 23, 2017 12:13 PM Aurelio Brash B wrote: CRM for notification. See Telephone encounter for: 07/23/17.  PT has changed pcps  And needs rxs on file with new PCP name  Pt is requesting 90 day supply of  gabapentin (NEURONTIN) 300 MG capsule  potassium chloride SA (KLOR-CON M20) 20 MEQ tablet  [facility] ([place]), [place] [phone] (Phone) [phone] (Fax)

## 2017-07-24 ENCOUNTER — Other Ambulatory Visit: Payer: Self-pay

## 2017-07-24 DIAGNOSIS — E876 Hypokalemia: Secondary | ICD-10-CM

## 2017-07-24 DIAGNOSIS — E1151 Type 2 diabetes mellitus with diabetic peripheral angiopathy without gangrene: Secondary | ICD-10-CM

## 2017-07-24 MED ORDER — POTASSIUM CHLORIDE CRYS ER 20 MEQ PO TBCR: 20.0000 meq | EXTENDED_RELEASE_TABLET | Freq: Every day | ORAL | 0 refills | Status: DC

## 2017-07-24 MED ORDER — GABAPENTIN 300 MG PO CAPS: 300.0000 mg | ORAL_CAPSULE | Freq: Three times a day (TID) | ORAL | 0 refills | Status: DC

## 2017-07-24 NOTE — Telephone Encounter (Signed)
Ok to refill for 90 days supply? We never send in these 2 rx for the pt. Pt has an appt with PCP on 08/03/2017.

## 2017-07-24 NOTE — Telephone Encounter (Signed)
Request for 90 day refill of Gabapentin, last filled on 01/17/17 #270 with one refill and Potassium chloride SA, last filled on  01/17/17 #90 with 1 refill. Pt's has changed PCP and current prescripts were filled by previous provider.   LOV: 03/30/17 PCP: Amado Coe  [facility]  [address]

## 2017-07-24 NOTE — Telephone Encounter (Signed)
ok 

## 2017-07-24 NOTE — Telephone Encounter (Signed)
Rx refills sent to pharmacy and pt notified. TLG

## 2017-07-24 NOTE — Telephone Encounter (Signed)
Rx refills sent to pharmacy as requested. TLG

## 2017-07-26 ENCOUNTER — Ambulatory Visit: Payer: Medicare Other | Admitting: Endocrinology

## 2017-07-30 ENCOUNTER — Other Ambulatory Visit: Payer: Self-pay | Admitting: Nurse Practitioner

## 2017-07-30 DIAGNOSIS — I10 Essential (primary) hypertension: Secondary | ICD-10-CM

## 2017-07-30 DIAGNOSIS — E785 Hyperlipidemia, unspecified: Secondary | ICD-10-CM

## 2017-07-30 NOTE — Progress Notes (Signed)
Please reschedule upcoming appt to July for HTN and hyperlipidemia f/up.

## 2017-08-03 ENCOUNTER — Ambulatory Visit: Payer: Medicare Other | Admitting: Nurse Practitioner

## 2017-08-06 ENCOUNTER — Other Ambulatory Visit: Payer: Self-pay | Admitting: Endocrinology

## 2017-08-08 ENCOUNTER — Ambulatory Visit: Payer: Medicare Other | Admitting: Nurse Practitioner

## 2017-08-09 ENCOUNTER — Ambulatory Visit: Payer: Medicare Other | Admitting: Family Medicine

## 2017-08-09 VITALS — BP 130/60 | HR 96 | Temp 97.8°F | Ht 68.0 in | Wt 191.4 lb

## 2017-08-09 DIAGNOSIS — N83202 Unspecified ovarian cyst, left side: Secondary | ICD-10-CM | POA: Insufficient documentation

## 2017-08-09 NOTE — Assessment & Plan Note (Signed)
>  15 minutes spent in face to face time with patient, >50% spent in counselling or coordination of care. Discussed report with pt and her husband. Discussed getting [place] vs direct referral to GYN.  We agreed to refer to GYN and they would manage further work up. The patient indicates understanding of these issues and agrees with the plan.

## 2017-08-09 NOTE — Patient Instructions (Signed)
Great to meet you.  Please stop by on your way out to schedule an appointment with a gynecologist on your way out.

## 2017-08-09 NOTE — Progress Notes (Signed)
Subjective:   Patient ID: Sandra Ray, female    DOB: [birth year], 72 y.o.   MRN: [id number]  Sandra Ray is a pleasant 72 y.o. year old female who presents to clinic today with Ovarian Cyst (went for a scan for her hip and found a cyst on her ovary. Thought her ovaries had been taken out when her uterus was taken but found out they didnt take them.. She now needs some guidance and where to go now for assistance)  on 08/09/2017  HPI:  Patient is new to me.  Has been seeing Dr. Nelva Bush-  MRI of left hip from 07/28/17 showed ovarian cyst and she was advised to follow up here today.  Ovarian Cyst-   2.8 x 2.5 x 1 cm lesion within the left ovary likely a complex cyst.  She didn't know she still had ovaries.  Had a hysterectomy after hemorrhaging 40 years ago. Current Outpatient Medications on File Prior to Visit  Medication Sig Dispense Refill  . acetaminophen (TYLENOL) 650 MG CR tablet Take 1,300 mg by mouth daily.     Marland Kitchen albuterol (PROVENTIL, VENTOLIN) (5 MG/ML) 0.5% NEBU albuterol    . albuterol (VENTOLIN HFA) 108 (90 BASE) MCG/ACT inhaler Inhale 2 puffs into the lungs every 6 (six) hours as needed.      . ALPRAZolam (XANAX) 0.5 MG tablet     . ascorbic acid (VITAMIN C) 100 MG tablet ascorbic acid (vitamin C)    . Ascorbic Acid (VITAMIN C) 500 MG tablet Take 500 mg by mouth daily.      Marland Kitchen aspirin 81 MG tablet Take 81 mg by mouth daily.      Marland Kitchen atorvastatin (LIPITOR) 20 MG tablet Take 1 tablet (20 mg total) by mouth daily. 90 tablet 1  . benzonatate (TESSALON) 100 MG capsule Take 1 capsule (100 mg total) by mouth 3 (three) times daily as needed for cough. 20 capsule 0  . Canagliflozin-metFORMIN HCl ER (INVOKAMET XR) [phone] MG TB24 Take 2 tablets by mouth daily. 60 tablet 2  . Cholecalciferol (VITAMIN D-3) 5000 UNITS TABS Take by mouth daily.     . Coenzyme Q10 100 MG TABS Take 400 mg by mouth daily.    . cyanocobalamin 500 MCG tablet Take 500 mcg by mouth daily.    . dapagliflozin  propanediol (FARXIGA) 10 MG TABS tablet Take 10 mg by mouth daily. 90 tablet 0  . Dapagliflozin-metFORMIN HCl ER (XIGDUO XR) 07-998 MG TB24 Take 2 tablets by mouth daily with breakfast. 180 tablet 2  . Dulaglutide (TRULICITY) 5.09 TO/6.7TI SOPN Inject 0.75 mg into the skin once a week. 12 pen 3  . fenofibrate (TRICOR) 145 MG tablet Take 1 tablet (145 mg total) by mouth daily. 90 tablet 1  . fluticasone (FLONASE) 50 MCG/ACT nasal spray Place 2 sprays into both nostrils daily. 16 g 0  . gabapentin (NEURONTIN) 300 MG capsule Take 1 capsule (300 mg total) by mouth 3 (three) times daily. 270 capsule 0  . Garlic 4580 MG CAPS Take 1 capsule by mouth daily.     Marland Kitchen glipiZIDE (GLUCOTROL XL) 2.5 MG 24 hr tablet Take 1 tablet (2.5 mg total) by mouth daily with breakfast. 30 tablet 3  . glucose blood (ONE TOUCH ULTRA TEST) test strip Use as instructed once a day.  DX code E11.40 100 each 5  . hydrochlorothiazide (HYDRODIURIL) 25 MG tablet Take 1 tablet (25 mg total) by mouth daily. 90 tablet 1  . Lancets (ONETOUCH ULTRASOFT) lancets  Use as instructed once day.  E11.40 100 each 5  . levofloxacin (LEVAQUIN) 500 MG tablet Take 1 tablet (500 mg total) by mouth daily. 7 tablet 0  . lisinopril (PRINIVIL,ZESTRIL) 5 MG tablet TAKE 1 TABLET BY MOUTH ONCE DAILY 90 tablet 1  . metFORMIN (GLUCOPHAGE-XR) 500 MG 24 hr tablet Take 4 tablets (2,000 mg total) by mouth daily with breakfast. 360 tablet 2  . ondansetron (ZOFRAN) 4 MG tablet Take 1 tablet (4 mg total) by mouth as needed for nausea or vomiting. 20 tablet 0  . oxymetazoline (AFRIN NASAL SPRAY) 0.05 % nasal spray Place 1 spray into both nostrils 2 (two) times daily. Use only for 3days, then stop 30 mL 0  . oxymetazoline (AFRIN) 0.05 % nasal spray Afrin (oxymetazoline)    . potassium chloride SA (KLOR-CON M20) 20 MEQ tablet Take 1 tablet (20 mEq total) by mouth daily. 90 tablet 0  . promethazine (PHENERGAN) 25 MG tablet TAKE ONE TABLET BY MOUTH 2 TIMES DAILY AS NEEDED  60 tablet 0  . sodium chloride (OCEAN) 0.65 % SOLN nasal spray Place 1 spray into both nostrils as needed for congestion. 15 mL 0  . TRULICITY 7.93 JQ/3.0SP SOPN  INJECT SUBCUTANEOUSLY ONCE WEEKLY 12 pen 1  . vitamin E 400 UNIT capsule Take 400 Units by mouth daily.       No current facility-administered medications on file prior to visit.     Allergies  Allergen Reactions  . Penicillins     Causes yeast infections  . Amoxicillin Other (See Comments)    Pt states it causes her a yeast infection   . Erythromycin     Passed out while taking    Past Medical History:  Diagnosis Date  . Arthritis   . Breast abscess    right breast  . Carpal tunnel syndrome   . Degenerative disc disease   . Diabetes mellitus type II   . Eczema of hand   . Hx of colonic polyps 12/02/2014   2004 - 2 diminutive polyps   . Hyperlipidemia   . Hypertension   . Metatarsal deformity   . Shingles   . Tubular adenoma of colon     Past Surgical History:  Procedure Laterality Date  . ABDOMINAL HYSTERECTOMY  1981  . ABSCESS DRAINAGE Right    breast  . APPENDECTOMY  1966  . BREAST BIOPSY Left   . carpel tunnel Right   . CESAREAN SECTION     x2  . COLONOSCOPY    . LUMBAR LAMINECTOMY     [id number]. 2011 L4,L5,S1  . Right rotator cuff repair 09/2015    . TUBAL LIGATION      Family History  Problem Relation Age of Onset  . Hypertension Father   . Diabetes Mother   . Stroke Mother   . Hypertension Mother   . High Cholesterol Mother   . Heart disease Mother   . Diabetes Brother   . Breast cancer Daughter   . Breast cancer Maternal Aunt   . Breast cancer Sister   . Colon polyps Brother     Social History   Socioeconomic History  . Marital status: Married    Spouse name: Not on file  . Number of children: 2  . Years of education: Not on file  . Highest education level: Not on file  Occupational History  . Not on file  Social Needs  . Financial resource strain: Not on file  . Food  insecurity:  Worry: Not on file    Inability: Not on file  . Transportation needs:    Medical: Not on file    Non-medical: Not on file  Tobacco Use  . Smoking status: Never Smoker  . Smokeless tobacco: Never Used  Substance and Sexual Activity  . Alcohol use: No  . Drug use: No  . Sexual activity: Not Currently  Lifestyle  . Physical activity:    Days per week: Not on file    Minutes per session: Not on file  . Stress: Not on file  Relationships  . Social connections:    Talks on phone: Not on file    Gets together: Not on file    Attends religious service: Not on file    Active member of club or organization: Not on file    Attends meetings of clubs or organizations: Not on file    Relationship status: Not on file  . Intimate partner violence:    Fear of current or ex partner: Not on file    Emotionally abused: Not on file    Physically abused: Not on file    Forced sexual activity: Not on file  Other Topics Concern  . Not on file  Social History Narrative   Married 2 daughters   Not working outside home   no caffeine   12/02/2014   The PMH, PSH, Social History, Family History, Medications, and allergies have been reviewed in [facility], and have been updated if relevant.   Review of Systems  Musculoskeletal: Positive for arthralgias.  All other systems reviewed and are negative.      Objective:    BP 130/60 (BP Location: Left Arm, Patient Position: Sitting, Cuff Size: Normal)   Pulse 96   Temp 97.8 F (36.6 C) (Oral)   Ht 5\' 8"  (1.727 m)   Wt 191 lb 6.4 oz (86.8 kg)   SpO2 94%   BMI 29.10 kg/m    Physical Exam  Constitutional: She is oriented to person, place, and time. She appears well-developed and well-nourished. No distress.  HENT:  Head: Normocephalic and atraumatic.  Eyes: EOM are normal.  Neck: Normal range of motion.  Cardiovascular: Normal rate.  Pulmonary/Chest: Effort normal.  Musculoskeletal: Normal range of motion.  Neurological: She is  alert and oriented to person, place, and time. No cranial nerve deficit.  Skin: Skin is warm and dry. She is not diaphoretic.  Psychiatric: She has a normal mood and affect. Her behavior is normal. Judgment and thought content normal.  Nursing note and vitals reviewed.         Assessment & Plan:   Left ovarian cyst - Plan: Ambulatory referral to Gynecology No follow-ups on file.

## 2017-08-13 ENCOUNTER — Other Ambulatory Visit: Payer: Self-pay | Admitting: Nurse Practitioner

## 2017-08-13 DIAGNOSIS — E785 Hyperlipidemia, unspecified: Secondary | ICD-10-CM

## 2017-08-17 ENCOUNTER — Other Ambulatory Visit: Payer: Self-pay | Admitting: Family Medicine

## 2017-08-17 DIAGNOSIS — I10 Essential (primary) hypertension: Secondary | ICD-10-CM

## 2017-08-23 ENCOUNTER — Ambulatory Visit (INDEPENDENT_AMBULATORY_CARE_PROVIDER_SITE_OTHER): Payer: Medicare Other | Admitting: Obstetrics & Gynecology

## 2017-08-23 ENCOUNTER — Encounter: Payer: Self-pay | Admitting: Obstetrics & Gynecology

## 2017-08-23 VITALS — BP 135/64 | HR 67 | Ht 68.0 in | Wt 191.1 lb

## 2017-08-23 DIAGNOSIS — Z803 Family history of malignant neoplasm of breast: Secondary | ICD-10-CM

## 2017-08-23 DIAGNOSIS — N83202 Unspecified ovarian cyst, left side: Secondary | ICD-10-CM

## 2017-08-23 NOTE — Progress Notes (Signed)
   Subjective:    Patient ID: Sandra Ray, female    DOB: 1945-10-03, 72 y.o.   MRN: [id number]  HPI 72 yo married for 54 years here today because a left ovarian cyst measuring 2.5 cm and complex was seen during a MRI done for left hip pain.  Her daughter, sister, maternal aunt has breast cancer No one has ovarian or colon cancer   Review of Systems Abstinent due to ED    Objective:   Physical Exam Breathing, conversing, and ambulating normally Well nourished, well hydrated White female, no apparent distress     Assessment & Plan:  Family history of breast cancer- [facility] Left ovarian cyst- check CA-125 and gyn u/s to evaluate right ovary (uterus is MIA)

## 2017-08-24 ENCOUNTER — Other Ambulatory Visit (INDEPENDENT_AMBULATORY_CARE_PROVIDER_SITE_OTHER): Payer: Medicare Other

## 2017-08-24 DIAGNOSIS — E1169 Type 2 diabetes mellitus with other specified complication: Secondary | ICD-10-CM | POA: Diagnosis not present

## 2017-08-24 DIAGNOSIS — E669 Obesity, unspecified: Secondary | ICD-10-CM

## 2017-08-24 LAB — COMPREHENSIVE METABOLIC PANEL
ALBUMIN: 4.2 g/dL (ref 3.5–5.2)
ALT: 22 U/L (ref 0–35)
AST: 19 U/L (ref 0–37)
Alkaline Phosphatase: 44 U/L (ref 39–117)
BUN: 15 mg/dL (ref 6–23)
CALCIUM: 9.9 mg/dL (ref 8.4–10.5)
CHLORIDE: 101 meq/L (ref 96–112)
CO2: 27 meq/L (ref 19–32)
Creatinine, Ser: 0.5 mg/dL (ref 0.40–1.20)
GFR: 128.88 mL/min (ref >60.00)
Glucose, Bld: 133 mg/dL — ABNORMAL HIGH (ref 70–99)
POTASSIUM: 4 meq/L (ref 3.5–5.1)
Sodium: 139 mEq/L (ref 135–145)
Total Bilirubin: 0.5 mg/dL (ref 0.2–1.2)
Total Protein: 6.7 g/dL (ref 6.0–8.3)

## 2017-08-24 LAB — MICROALBUMIN / CREATININE URINE RATIO
CREATININE, U: 77.4 mg/dL
MICROALB UR: 0.9 mg/dL (ref 0.0–1.9)
Microalb Creat Ratio: 1.2 mg/g (ref 0.0–30.0)

## 2017-08-24 LAB — HEMOGLOBIN A1C: HEMOGLOBIN A1C: 7.6 % — AB (ref 4.6–6.5)

## 2017-08-24 LAB — CA 125: CANCER ANTIGEN (CA) 125: 7.2 U/mL (ref 0.0–38.1)

## 2017-08-26 NOTE — Progress Notes (Signed)
Patient ID: Sandra Ray, female   DOB: Jun 01, 1945, 72 y.o.   MRN: [id number]            Reason for Appointment: Follow-up for Type 2 Diabetes  Referring physician: Etter Sjogren   History of Present Illness:          Date of diagnosis of type 2 diabetes mellitus: 1996       Background history:   She had a high blood sugar on lab work which diagnosed her diabetes and she was most likely treated with metformin for a few years She has also taking glipizide for several years She was tried on Actos which was started because of significant weight gain. Over the last few years she has been taking either  Januvia or Tradjenta with her metformin in combination with fair control Her A1c has been below 8% only twice in the last 2-3 years  Recent history:   Her baseline A1c was 8.4% on her initial consultation and now it is 7.6  Non-insulin hypoglycemic drugs: Glipizide ER 5 mg, Trulicity 6.23 mg weekly, not on Farxiga 10 mg in the morning,   Current management, blood sugar patterns and problems identified:  She was supposed to switch to [employer] but she could not get this because of cost and difficulties with insurance  She thinks she only took [place] for about a month or so earlier this year but this is apparently not covered and she did not let us know  She is getting some nausea or diarrhea from metformin which she is taking 2 tablets twice a day along with her glipizide and Trulicity  She has checked her blood sugars very sporadically at home  FASTING readings recently ranged from 122 up to 163, average 135  No readings after meals.  She thinks her sugars may be higher because of her having more pain, occasional steroid injections in her hip and lack of physical activity  No side effects from Trulicity        Side effects from medications have been: Weight gain from Actos, nausea from high dose metformin  Compliance with the medical regimen: Good  Glucose monitoring:  done <1 times  a day         Glucometer: One Touch.       Blood Glucose readings by time of day and averages from meter as above    Self-care: The diet that the patient has been following is: tries to limit fried food.     Typical meal intake: Breakfast is cereal, egg, almond milk,  lunch [place] burger and salad, dinner is soup and crackers and vegetables.  Snacks are usually small apple                Dietician visit, most recent: 07/13/16               Exercise:  No walking recently  Weight history: Previous range 200-254  Wt Readings from Last 3 Encounters:  08/27/17 191 lb 3.2 oz (86.7 kg)  08/23/17 191 lb 1.6 oz (86.7 kg)  08/09/17 191 lb 6.4 oz (86.8 kg)    Glycemic control:   Lab Results  Component Value Date   HGBA1C 7.6 (H) 08/24/2017   HGBA1C 6.6 (H) 03/16/2017   HGBA1C 6.6 (H) 12/12/2016   Lab Results  Component Value Date   MICROALBUR 0.9 08/24/2017   LDLCALC 72 11/23/2016   CREATININE 0.50 08/24/2017   Lab Results  Component Value Date   MICRALBCREAT 1.2 08/24/2017  Lab Results  Component Value Date   FRUCTOSAMINE 328 (H) 07/07/2016      Allergies as of 08/27/2017      Reactions   Penicillins    Causes yeast infections   Amoxicillin Other (See Comments)   Pt states it causes her a yeast infection    Erythromycin    Passed out while taking      Medication List        Accurate as of 08/27/17  4:58 PM. Always use your most recent med list.          acetaminophen 650 MG CR tablet Commonly known as:  TYLENOL Take 1,300 mg by mouth daily.   aspirin 81 MG tablet Take 81 mg by mouth daily.   atorvastatin 20 MG tablet Commonly known as:  LIPITOR TAKE 1 TABLET BY MOUTH ONCE DAILY   benzonatate 100 MG capsule Commonly known as:  TESSALON Take 1 capsule (100 mg total) by mouth 3 (three) times daily as needed for cough.   Canagliflozin-metFORMIN HCl ER [phone] MG Tb24 Commonly known as:  INVOKAMET XR Take 2 tablets by mouth daily.   Coenzyme Q10 100  MG Tabs Take 400 mg by mouth daily.   Dulaglutide 0.75 MG/0.5ML Sopn Commonly known as:  TRULICITY Inject 2.35 mg into the skin once a week.   fenofibrate 145 MG tablet Commonly known as:  TRICOR Take 1 tablet (145 mg total) by mouth daily.   fluticasone 50 MCG/ACT nasal spray Commonly known as:  FLONASE Place 2 sprays into both nostrils daily.   gabapentin 300 MG capsule Commonly known as:  NEURONTIN Take 1 capsule (300 mg total) by mouth 3 (three) times daily.   Garlic 5732 MG Caps Take 1 capsule by mouth daily.   glipiZIDE 2.5 MG 24 hr tablet Commonly known as:  GLUCOTROL XL Take 1 tablet (2.5 mg total) by mouth daily with breakfast.   glucose blood test strip Commonly known as:  ONE TOUCH ULTRA TEST Use as instructed once a day.  DX code E11.40   hydrochlorothiazide 25 MG tablet Commonly known as:  HYDRODIURIL Take 1 tablet (25 mg total) by mouth daily.   lisinopril 5 MG tablet Commonly known as:  PRINIVIL,ZESTRIL TAKE 1 TABLET BY MOUTH ONCE DAILY   metFORMIN 500 MG 24 hr tablet Commonly known as:  GLUCOPHAGE-XR Take 4 tablets (2,000 mg total) by mouth daily with breakfast.   ondansetron 4 MG tablet Commonly known as:  ZOFRAN Take 1 tablet (4 mg total) by mouth as needed for nausea or vomiting.   onetouch ultrasoft lancets Use as instructed once day.  E11.40   potassium chloride SA 20 MEQ tablet Commonly known as:  KLOR-CON M20 Take 1 tablet (20 mEq total) by mouth daily.   sodium chloride 0.65 % Soln nasal spray Commonly known as:  OCEAN Place 1 spray into both nostrils as needed for congestion.   VENTOLIN HFA 108 (90 Base) MCG/ACT inhaler Generic drug:  albuterol Inhale 2 puffs into the lungs every 6 (six) hours as needed.   vitamin B-12 500 MCG tablet Commonly known as:  CYANOCOBALAMIN Take 500 mcg by mouth daily.   Vitamin D-3 5000 units Tabs Take by mouth daily.       Allergies:  Allergies  Allergen Reactions  . Penicillins      Causes yeast infections  . Amoxicillin Other (See Comments)    Pt states it causes her a yeast infection   . Erythromycin     Passed out  while taking    Past Medical History:  Diagnosis Date  . Arthritis   . Breast abscess    right breast  . Carpal tunnel syndrome   . Degenerative disc disease   . Diabetes mellitus type II   . Eczema of hand   . Hx of colonic polyps 12/02/2014   2004 - 2 diminutive polyps   . Hyperlipidemia   . Hypertension   . Metatarsal deformity   . Shingles   . Tubular adenoma of colon     Past Surgical History:  Procedure Laterality Date  . ABDOMINAL HYSTERECTOMY  1981  . ABSCESS DRAINAGE Right    breast  . APPENDECTOMY  1966  . BREAST BIOPSY Left   . carpel tunnel Right   . CESAREAN SECTION     x2  . COLONOSCOPY    . LUMBAR LAMINECTOMY     [id number]. 2011 L4,L5,S1  . Right rotator cuff repair 09/2015    . TUBAL LIGATION      Family History  Problem Relation Age of Onset  . Hypertension Father   . Diabetes Mother   . Stroke Mother   . Hypertension Mother   . High Cholesterol Mother   . Heart disease Mother   . Diabetes Brother   . Breast cancer Daughter   . Breast cancer Maternal Aunt   . Breast cancer Sister   . Colon polyps Brother     Social History:  reports that she has never smoked. She has never used smokeless tobacco. She reports that she does not drink alcohol or use drugs.   Review of Systems   Lipid history: Has had high triglycerides and LDL, LDL below 100 Is on combination of Lipitor and fenofibrate from her PCP   Lab Results  Component Value Date   CHOL 162 11/23/2016   HDL 56.90 11/23/2016   LDLCALC 72 11/23/2016   LDLDIRECT 91.0 08/22/2016   TRIG 170.0 (H) 11/23/2016   CHOLHDL 3 11/23/2016           Hypertension:Mild and usually controlled with 5 mg Lisinopril and 25 mg HCTZ prescribed by  PCP  BP Readings from Last 3 Encounters:  08/27/17 (!) 142/80  08/23/17 135/64  08/09/17 130/60     Most  recent eye exam was 04/2016  Most recent foot exam:06/2016 Has evidence of neuropathy on exam  Take Zofran as needed for nausea   LABS:  Lab on 08/24/2017  Component Date Value Ref Range Status  . Microalb, Ur 08/24/2017 0.9  0.0 - 1.9 mg/dL Final  . Creatinine,U 08/24/2017 77.4  mg/dL Final  . Microalb Creat Ratio 08/24/2017 1.2  0.0 - 30.0 mg/g Final  . Sodium 08/24/2017 139  135 - 145 mEq/L Final  . Potassium 08/24/2017 4.0  3.5 - 5.1 mEq/L Final  . Chloride 08/24/2017 101  96 - 112 mEq/L Final  . CO2 08/24/2017 27  19 - 32 mEq/L Final  . Glucose, Bld 08/24/2017 133* 70 - 99 mg/dL Final  . BUN 08/24/2017 15  6 - 23 mg/dL Final  . Creatinine, Ser 08/24/2017 0.50  0.40 - 1.20 mg/dL Final  . Total Bilirubin 08/24/2017 0.5  0.2 - 1.2 mg/dL Final  . Alkaline Phosphatase 08/24/2017 44  39 - 117 U/L Final  . AST 08/24/2017 19  0 - 37 U/L Final  . ALT 08/24/2017 22  0 - 35 U/L Final  . Total Protein 08/24/2017 6.7  6.0 - 8.3 g/dL Final  . Albumin 08/24/2017 4.2  3.5 - 5.2 g/dL Final  . Calcium 08/24/2017 9.9  8.4 - 10.5 mg/dL Final  . GFR 08/24/2017 128.88  >60.00 mL/min Final  . Hgb A1c MFr Bld 08/24/2017 7.6* 4.6 - 6.5 % Final   Glycemic Control Guidelines for People with Diabetes:Non Diabetic:  <6%Goal of Therapy: <7%Additional Action Suggested:  >8%   Office Visit on 08/23/2017  Component Date Value Ref Range Status  . Cancer Antigen (CA) 125 08/23/2017 7.2  0.0 - 38.1 U/mL Final   Comment: Roche [employer]ostics Electrochemiluminescence Immunoassay (ECLIA) Values obtained with different assay methods or kits cannot be used interchangeably.  Results cannot be interpreted as absolute evidence of the presence or absence of malignant disease.     Physical Examination:  BP (!) 142/80 (BP Location: Left Arm, Patient Position: Sitting, Cuff Size: Normal)   Pulse 72   Ht 5\' 8"  (1.727 m)   Wt 191 lb 3.2 oz (86.7 kg)   SpO2 98%   BMI 29.07 kg/m   Diabetic Foot Exam - Simple     Simple Foot Form Diabetic Foot exam was performed with the following findings:  Yes   Visual Inspection No deformities, no ulcerations, no other skin breakdown bilaterally:  Yes Sensation Testing See comments:  Yes Pulse Check Posterior Tibialis and Dorsalis pulse intact bilaterally:  Yes Comments Monofilament sensation absent in the distal toes bilaterally    ASSESSMENT:  Diabetes type 2, uncontrolled   See history of present illness for detailed discussion of current diabetes management, blood sugar patterns and problems identified   Her A1c is much higher at 7.6 although previously had been fairly consistent below 7  Likely has not had good control with her not taking [place] despite her continuing Trulicity Also has some nausea from metformin full doses even though she probably did tolerate Xigduo better than metformin alone This is apparently not covered now and she did not let us know about not taking this or [place]   PLAN:    Since her insurance will cover [place] she can switch metformin to Invokamet XR 150/1000, 2 tablets daily, 30-day free trial given  This should be at least equivalent to her previous regimen of [place] and Jentadueto  She will continue low-dose glipizide  She does need to start checking her sugars consistently and some after meals also  Follow-up in 3 months To call if she has any unusual high blood sugars    Patient Instructions  Check blood sugars on waking up  2-3/7 days  Also check blood sugars about 2 hours after a meal and do this after different meals by rotation  Recommended blood sugar levels on waking up is 90-130 and about 2 hours after meal is 130-160  Please bring your blood sugar monitor to each visit, thank you   Metformin stop when on Invokamet  HCTZ 1/2 days    .  Elayne Snare 08/27/2017, 4:58 PM   Note: This office note was prepared with Dragon voice recognition system technology. Any transcriptional errors  that result from this process are unintentional.

## 2017-08-27 ENCOUNTER — Ambulatory Visit: Payer: Medicare Other | Admitting: Endocrinology

## 2017-08-27 ENCOUNTER — Encounter: Payer: Self-pay | Admitting: Endocrinology

## 2017-08-27 VITALS — BP 142/80 | HR 72 | Ht 68.0 in | Wt 191.2 lb

## 2017-08-27 DIAGNOSIS — E1165 Type 2 diabetes mellitus with hyperglycemia: Secondary | ICD-10-CM | POA: Diagnosis not present

## 2017-08-27 MED ORDER — CANAGLIFLOZIN-METFORMIN HCL ER 150-1000 MG PO TB24: 2.0000 | ORAL_TABLET | Freq: Every day | ORAL | 3 refills | Status: DC

## 2017-08-27 NOTE — Patient Instructions (Addendum)
Check blood sugars on waking up  2-3/7 days  Also check blood sugars about 2 hours after a meal and do this after different meals by rotation  Recommended blood sugar levels on waking up is 90-130 and about 2 hours after meal is 130-160  Please bring your blood sugar monitor to each visit, thank you   Metformin stop when on Invokamet  HCTZ 1/2 days

## 2017-08-31 ENCOUNTER — Ambulatory Visit (HOSPITAL_BASED_OUTPATIENT_CLINIC_OR_DEPARTMENT_OTHER)
Admission: RE | Admit: 2017-08-31 | Discharge: 2017-08-31 | Disposition: A | Discharge status: Home / Self Care | Payer: Medicare Other | Source: Ambulatory Visit | Attending: Obstetrics & Gynecology | Admitting: Obstetrics & Gynecology

## 2017-08-31 ENCOUNTER — Telehealth: Payer: Self-pay

## 2017-08-31 ENCOUNTER — Other Ambulatory Visit: Payer: Self-pay

## 2017-08-31 DIAGNOSIS — Z90710 Acquired absence of both cervix and uterus: Secondary | ICD-10-CM | POA: Diagnosis not present

## 2017-08-31 DIAGNOSIS — N838 Other noninflammatory disorders of ovary, fallopian tube and broad ligament: Secondary | ICD-10-CM | POA: Diagnosis not present

## 2017-08-31 DIAGNOSIS — N83202 Unspecified ovarian cyst, left side: Secondary | ICD-10-CM

## 2017-08-31 NOTE — Progress Notes (Signed)
Per Dr. Hulan Fray patient can have MRI of pelvis for follow up. Dr. Hulan Fray states patients CA 125 is normal. [name] RN

## 2017-08-31 NOTE — Telephone Encounter (Signed)
Radiology assistant called report on Sandra Ray to our office. Please see patient imagine report- suggesting considering MRI for follow up and possible surgical evaluation of left ovarian lesion.  Will route to provider. [name] RN

## 2017-08-31 NOTE — Telephone Encounter (Signed)
Attempted to reach patient x2 to make her aware of ultrasound results and normal CA-125.   Patient needs to know MRI is ordered for her.  Unable to leave message as phone just hang up after ringing 4 times. [name] RN

## 2017-09-04 ENCOUNTER — Other Ambulatory Visit: Payer: Self-pay | Admitting: Nurse Practitioner

## 2017-09-04 DIAGNOSIS — I10 Essential (primary) hypertension: Secondary | ICD-10-CM

## 2017-09-04 NOTE — Telephone Encounter (Signed)
Pt needs to keep an appt with PCP in 07/19 for more refills.

## 2017-09-05 ENCOUNTER — Telehealth: Payer: Self-pay

## 2017-09-05 NOTE — Telephone Encounter (Signed)
Patient called back after multiple attempts to reach her. Made her aware of need for MRI. Patient made aware she should call our imaging dept (given phone number) and schedule her MRI. Patient requesting [facility] and I made her aware to make this request with imaging when she calls them to schedule. [name] RN

## 2017-09-10 ENCOUNTER — Other Ambulatory Visit: Payer: Self-pay | Admitting: Family Medicine

## 2017-09-10 DIAGNOSIS — I10 Essential (primary) hypertension: Secondary | ICD-10-CM

## 2017-09-10 DIAGNOSIS — E785 Hyperlipidemia, unspecified: Secondary | ICD-10-CM

## 2017-09-15 ENCOUNTER — Ambulatory Visit (HOSPITAL_BASED_OUTPATIENT_CLINIC_OR_DEPARTMENT_OTHER)
Admission: RE | Admit: 2017-09-15 | Discharge: 2017-09-15 | Disposition: A | Discharge status: Home / Self Care | Payer: Medicare Other | Source: Ambulatory Visit | Attending: Obstetrics & Gynecology | Admitting: Obstetrics & Gynecology

## 2017-09-15 DIAGNOSIS — N83202 Unspecified ovarian cyst, left side: Secondary | ICD-10-CM | POA: Diagnosis present

## 2017-09-15 DIAGNOSIS — Z90710 Acquired absence of both cervix and uterus: Secondary | ICD-10-CM | POA: Insufficient documentation

## 2017-09-15 MED ORDER — GADOBENATE DIMEGLUMINE 529 MG/ML IV SOLN
15.0000 mL | Freq: Once | INTRAVENOUS | Status: AC | PRN
  Administered 2017-09-15: 15 mL via INTRAVENOUS

## 2017-09-18 ENCOUNTER — Telehealth: Payer: Self-pay

## 2017-09-18 NOTE — Telephone Encounter (Signed)
Patient wondering what the results of her MRI from this weekend. Patient states that she is still in pain. She does NOT want pain medication but would like a plan of care based off the MRI results.  Assured patient I would route message to Dr Hulan Fray and get back to her once I have heard from her. [name] RN

## 2017-09-22 ENCOUNTER — Other Ambulatory Visit: Payer: Self-pay

## 2017-09-22 ENCOUNTER — Inpatient Hospital Stay (HOSPITAL_COMMUNITY)
Admission: AD | Admit: 2017-09-22 | Discharge: 2017-09-22 | Disposition: A | Discharge status: Home / Self Care | Payer: Medicare Other | Source: Ambulatory Visit | Attending: Obstetrics & Gynecology | Admitting: Obstetrics & Gynecology

## 2017-09-22 ENCOUNTER — Encounter (HOSPITAL_COMMUNITY): Payer: Self-pay

## 2017-09-22 DIAGNOSIS — Z8249 Family history of ischemic heart disease and other diseases of the circulatory system: Secondary | ICD-10-CM | POA: Insufficient documentation

## 2017-09-22 DIAGNOSIS — E785 Hyperlipidemia, unspecified: Secondary | ICD-10-CM | POA: Insufficient documentation

## 2017-09-22 DIAGNOSIS — Z880 Allergy status to penicillin: Secondary | ICD-10-CM | POA: Diagnosis not present

## 2017-09-22 DIAGNOSIS — E119 Type 2 diabetes mellitus without complications: Secondary | ICD-10-CM | POA: Insufficient documentation

## 2017-09-22 DIAGNOSIS — Z881 Allergy status to other antibiotic agents status: Secondary | ICD-10-CM | POA: Insufficient documentation

## 2017-09-22 DIAGNOSIS — I10 Essential (primary) hypertension: Secondary | ICD-10-CM | POA: Insufficient documentation

## 2017-09-22 DIAGNOSIS — Z803 Family history of malignant neoplasm of breast: Secondary | ICD-10-CM | POA: Diagnosis not present

## 2017-09-22 DIAGNOSIS — Z833 Family history of diabetes mellitus: Secondary | ICD-10-CM | POA: Diagnosis not present

## 2017-09-22 DIAGNOSIS — Z823 Family history of stroke: Secondary | ICD-10-CM | POA: Insufficient documentation

## 2017-09-22 DIAGNOSIS — Z8371 Family history of colonic polyps: Secondary | ICD-10-CM | POA: Diagnosis not present

## 2017-09-22 DIAGNOSIS — N838 Other noninflammatory disorders of ovary, fallopian tube and broad ligament: Secondary | ICD-10-CM

## 2017-09-22 DIAGNOSIS — Z86010 Personal history of colonic polyps: Secondary | ICD-10-CM | POA: Insufficient documentation

## 2017-09-22 DIAGNOSIS — Z90710 Acquired absence of both cervix and uterus: Secondary | ICD-10-CM | POA: Insufficient documentation

## 2017-09-22 DIAGNOSIS — R109 Unspecified abdominal pain: Secondary | ICD-10-CM | POA: Insufficient documentation

## 2017-09-22 DIAGNOSIS — N839 Noninflammatory disorder of ovary, fallopian tube and broad ligament, unspecified: Secondary | ICD-10-CM | POA: Insufficient documentation

## 2017-09-22 LAB — URINALYSIS, ROUTINE W REFLEX MICROSCOPIC
Bacteria, UA: NONE SEEN
Bilirubin Urine: NEGATIVE
HGB URINE DIPSTICK: NEGATIVE
Ketones, ur: NEGATIVE mg/dL
LEUKOCYTES UA: NEGATIVE
Nitrite: NEGATIVE
PH: 5 (ref 5.0–8.0)
Protein, ur: NEGATIVE mg/dL
Specific Gravity, Urine: 1.031 — ABNORMAL HIGH (ref 1.005–1.030)

## 2017-09-22 MED ORDER — IBUPROFEN 600 MG PO TABS: 600.0000 mg | ORAL_TABLET | Freq: Four times a day (QID) | ORAL | 0 refills | Status: DC | PRN

## 2017-09-22 MED ORDER — OXYCODONE-ACETAMINOPHEN 5-325 MG PO TABS: 1.0000 | ORAL_TABLET | ORAL | 0 refills | Status: DC | PRN

## 2017-09-22 NOTE — Discharge Instructions (Signed)

## 2017-09-22 NOTE — MAU Note (Signed)
Pt endorses having a cyst on her left ovary States pain will not go away Has brought results from mri and u/s from a week ago Rating pain 4/10  Vitals:   09/22/17 1153  BP: (!) 147/76  Pulse: 85  Resp: 18  Temp: 97.9 F (36.6 C)  SpO2: 98%

## 2017-09-22 NOTE — MAU Provider Note (Signed)
History    Ms Orlan Leavens is a 72 yo  in  today in accompaniment of her husband.  They are in today with concerns regarding her pelvic ultrasound and MRI that was done the first week in July.  She states they have made multiple calls to the various offices in regards to getting the appointment for a plan of care and surgical intervention for her ovarian mass but no one will call her back.  So her pain level is no worse today but her husband is frustrated and wants to get her care.  And that is what brought them in today to see [place].  CSN: [id number]  Arrival date & time 09/22/17  1136   None     No chief complaint on file.   HPI  Past Medical History:  Diagnosis Date  . Arthritis   . Breast abscess    right breast  . Carpal tunnel syndrome   . Degenerative disc disease   . Diabetes mellitus type II   . Eczema of hand   . Hx of colonic polyps 12/02/2014   2004 - 2 diminutive polyps   . Hyperlipidemia   . Hypertension   . Metatarsal deformity   . Shingles   . Tubular adenoma of colon     Past Surgical History:  Procedure Laterality Date  . ABDOMINAL HYSTERECTOMY  1981  . ABSCESS DRAINAGE Right    breast  . APPENDECTOMY  1966  . BREAST BIOPSY Left   . carpel tunnel Right   . CESAREAN SECTION     x2  . COLONOSCOPY    . LUMBAR LAMINECTOMY     [id number]. 2011 L4,L5,S1  . Right rotator cuff repair 09/2015    . TUBAL LIGATION      Family History  Problem Relation Age of Onset  . Hypertension Father   . Diabetes Mother   . Stroke Mother   . Hypertension Mother   . High Cholesterol Mother   . Heart disease Mother   . Diabetes Brother   . Breast cancer Daughter   . Breast cancer Maternal Aunt   . Breast cancer Sister   . Colon polyps Brother     Social History   Tobacco Use  . Smoking status: Never Smoker  . Smokeless tobacco: Never Used  Substance Use Topics  . Alcohol use: No  . Drug use: No    OB History    Gravida  2   Para  2   Term  2   Preterm      AB      Living  2     SAB      TAB      Ectopic      Multiple      Live Births  2           Review of Systems  Constitutional: Negative.   HENT: Negative.   Eyes: Negative.   Respiratory: Negative.   Cardiovascular: Negative.   Gastrointestinal: Positive for abdominal pain.  Endocrine: Negative.   Genitourinary: Negative.   Musculoskeletal: Negative.   Skin: Negative.   Allergic/Immunologic: Negative.   Neurological: Negative.   Hematological: Negative.   Psychiatric/Behavioral: Negative.     Allergies  Penicillins; Amoxicillin; and Erythromycin  Home Medications    BP (!) 147/76 (BP Location: Right Arm)   Pulse 85   Temp 97.9 F (36.6 C) (Oral)   Resp 18   Wt 188 lb 1.3 oz (85.3 kg)   SpO2  98% Comment: ra  BMI 28.60 kg/m   Physical Exam  Constitutional: She is oriented to person, place, and time. She appears well-developed and well-nourished.  HENT:  Head: Normocephalic.  Eyes: Pupils are equal, round, and reactive to light.  Neck: Normal range of motion.  Cardiovascular: Normal rate and regular rhythm.  Pulmonary/Chest: Effort normal.  Abdominal: Soft. Bowel sounds are normal.  Musculoskeletal: Normal range of motion.  Neurological: She is alert and oriented to person, place, and time.  Skin: Skin is warm and dry.  Psychiatric: She has a normal mood and affect. Her behavior is normal. Judgment and thought content normal.    MAU Course  Procedures (including critical care time)  Labs Reviewed  URINALYSIS, ROUTINE W REFLEX MICROSCOPIC   No results found.   Dx: left ovaarian mass   MDM  The patient's vital signs are stable.  She is alert and oriented.  After lengthy discussion with her and her husband in regards to being unable to get an appointment for plan of care and management I have reassured her that I will contact [facility] reset and they will get her in as soon as possible for management.  Also to talk to Dr. Harolyn Rutherford to and she is in  agreement with this plan.  I am going to discharge her home in stable condition with some pain medicine to manage discomfort until she can get  and am be seen.

## 2017-09-23 ENCOUNTER — Other Ambulatory Visit: Payer: Self-pay | Admitting: Endocrinology

## 2017-09-26 ENCOUNTER — Encounter: Payer: Self-pay | Admitting: Nurse Practitioner

## 2017-09-27 ENCOUNTER — Telehealth: Payer: Self-pay

## 2017-09-27 NOTE — Telephone Encounter (Signed)
Patient was seen in MAU and would like follow up with Dr. Hulan Fray in regards to MRI results.  Dr. Hulan Fray not in our office for next few months. Attempted to call patient and see if she would be willing to see Dr. Hulan Fray at her [place] office. Phone rings several time and then cuts off. Unable to leave message. [name] RN

## 2017-10-01 ENCOUNTER — Ambulatory Visit: Payer: Medicare Other | Admitting: Nurse Practitioner

## 2017-10-01 ENCOUNTER — Encounter: Payer: Self-pay | Admitting: Nurse Practitioner

## 2017-10-01 VITALS — BP 144/72 | HR 73 | Temp 97.9°F | Ht 68.0 in | Wt 190.0 lb

## 2017-10-01 DIAGNOSIS — N83202 Unspecified ovarian cyst, left side: Secondary | ICD-10-CM

## 2017-10-01 DIAGNOSIS — I10 Essential (primary) hypertension: Secondary | ICD-10-CM | POA: Diagnosis not present

## 2017-10-01 DIAGNOSIS — E785 Hyperlipidemia, unspecified: Secondary | ICD-10-CM

## 2017-10-01 DIAGNOSIS — M25552 Pain in left hip: Secondary | ICD-10-CM

## 2017-10-01 MED ORDER — LISINOPRIL 5 MG PO TABS: 5.0000 mg | ORAL_TABLET | Freq: Every day | ORAL | 3 refills | Status: DC

## 2017-10-01 MED ORDER — HYDROCHLOROTHIAZIDE 25 MG PO TABS: 25.0000 mg | ORAL_TABLET | Freq: Every day | ORAL | 1 refills | Status: DC

## 2017-10-01 MED ORDER — ATORVASTATIN CALCIUM 20 MG PO TABS: 20.0000 mg | ORAL_TABLET | Freq: Every day | ORAL | 3 refills | Status: DC

## 2017-10-01 NOTE — Patient Instructions (Addendum)
Hold potassium supplement till next repeat BMP.  Please schedule diabetic eye exam once a day. Have report faxed to me.  Please sign medical release form to have records faxed to Dr. Ronita Hipps (GYN).   Have [facility] draw lipid panel when you go for BMP and HgbA1c to be done in 11/25/2017. Lipid panel need to be done fasting (6-8hrs prior to blood draw).  Continue use of ibuprofen and oxycodone as prescribed for hip pain.  Maintain appt with ortho and GYN.  You may alternate between warm and cold compress as needed for hip pain.

## 2017-10-01 NOTE — Assessment & Plan Note (Signed)
Reports persistent LLQ and left hip pain. Onset 91months ago. Pain is intermittent, worse with prolonged standing and walking, describes as sharp and stabbing. Pain radiates to groin and upper thigh. Improves with ibuprofen and rest. She has not had to use oxycodone prescribed by ED provider no change in GU or GI function, no weight loss, no change in function. Per patient Ortho recommended physical therapy for left hip pain, but she will like left ovary mass removed first. Theredore she will wait to talk to Dr. Ronita Hipps.  Hip MRI also indicated probable anterior superior labral tear, gluteus minimus tendinopathy, gluteus medius tendonosis, moderate partial tear of hamstring tendon.

## 2017-10-01 NOTE — Progress Notes (Signed)
Subjective:  Patient ID: Sandra Ray, female    DOB: [birth year]  Age: 72 y.o. MRN: [id number]  CC: Follow-up (follow up/ pt found cyst on on her left ovary due to pain, had MRI result,pt wen to the ED due to pain, receive ibuprofen and oxycodone. med consult. )   HPI    Left Ovary mass: Incidental finding during evaluation of left hip pain by ortho. Initially seen on Hip MRI. Then followed by pelvic US and Pelvic MRI. repeat pelvic Mri in 97months was recommended, but she will like mass removed. Also had negative CA125. She had difficulty reaching with Dr. Hulan Fray (GYN), so she made appt with Dr. Ronita Hipps  ([facility]) on 10/31/2017. She does not want to wait 46months to repeat MRI. Fhx of breast cancer (sister and daughter) No Fhx of ovarian and/or colon cancer.  Reports persistent LLQ and left hip pain. Onset 64months ago. Pain is intermittent, worse with prolonged standing and walking, describes as sharp and stabbing. Pain radiates to groin and upper thigh. Improves with ibuprofen and rest. She has not had to use oxycodone prescribed by ED provider no change in GU or GI function, no weight loss, no change in function. Per patient Ortho recommended physical therapy for left hip pain, but she will like left ovary mass removed first. Therefore she will wait to talk to Dr. Ronita Hipps.  Hip MRI also indicated probable anterior superior labral tear, gluteus minimus tendinopathy, gluteus medius tendonosis, moderate partial tear of hamstring tendon.  HTN: Stable with lisinopril and HCTZ. BP Readings from Last 3 Encounters:  10/01/17 (!) 144/72  09/22/17 129/79  08/27/17 (!) 142/80   DM: Managed by Dr. Dwyane Dee. Last seen 08/27/2017 Last HgbA1c 7.6. Will schedule eye exam. Up to date with dental cleaning. Intermittent nausea and diarrhea with Invokana/metformin. Also uses trulicity and glipizide.   Hyperlipidemia: Needs repeat lipid panel. Current use of atorvastatin. Lipid Panel       Component Value Date/Time   CHOL 162 11/23/2016 1033   TRIG 170.0 (H) 11/23/2016 1033   HDL 56.90 11/23/2016 1033   CHOLHDL 3 11/23/2016 1033   VLDL 34.0 11/23/2016 1033   LDLCALC 72 11/23/2016 1033   LDLDIRECT 91.0 08/22/2016 0907   Reviewed past Medical, Social and Family history today.  Outpatient Medications Prior to Visit  Medication Sig Dispense Refill  . albuterol (VENTOLIN HFA) 108 (90 BASE) MCG/ACT inhaler Inhale 2 puffs into the lungs every 6 (six) hours as needed.      Marland Kitchen aspirin 81 MG tablet Take 81 mg by mouth daily.      . benzonatate (TESSALON) 100 MG capsule Take 1 capsule (100 mg total) by mouth 3 (three) times daily as needed for cough. 20 capsule 0  . Canagliflozin-metFORMIN HCl ER (INVOKAMET XR) [phone] MG TB24 Invokamet XR 150 mg-1,000 mg tablet, extended release  TAKE 2 TABLETS BY MOUTH ONCE DAILY    . Cholecalciferol (VITAMIN D-3) 5000 UNITS TABS Take by mouth daily.     . Coenzyme Q10 100 MG TABS Take 400 mg by mouth daily.    . cyanocobalamin 500 MCG tablet Take 500 mcg by mouth daily.    . Dulaglutide (TRULICITY) 5.46 FK/8.1EX SOPN Inject 0.75 mg into the skin once a week. 12 pen 3  . fluticasone (FLONASE) 50 MCG/ACT nasal spray Place 2 sprays into both nostrils daily. 16 g 0  . gabapentin (NEURONTIN) 300 MG capsule Take 1 capsule (300 mg total) by mouth 3 (three) times daily. [place]  capsule 0  . Garlic 6295 MG CAPS Take 1 capsule by mouth daily.     Marland Kitchen glipiZIDE (GLUCOTROL XL) 2.5 MG 24 hr tablet Take 1 tablet (2.5 mg total) by mouth daily with breakfast. 30 tablet 3  . glucose blood (ONE TOUCH ULTRA TEST) test strip Use as instructed once a day.  DX code E11.40 100 each 5  . ibuprofen (ADVIL,MOTRIN) 600 MG tablet Take 1 tablet (600 mg total) by mouth every 6 (six) hours as needed. 30 tablet 0  . Lancets (ONETOUCH ULTRASOFT) lancets Use as instructed once day.  E11.40 100 each 5  . ondansetron (ZOFRAN) 4 MG tablet Take 1 tablet (4 mg total) by mouth as needed for  nausea or vomiting. 20 tablet 0  . oxyCODONE-acetaminophen (PERCOCET) 5-325 MG tablet Take 1 tablet by mouth every 4 (four) hours as needed for severe pain. 30 tablet 0  . sodium chloride (OCEAN) 0.65 % SOLN nasal spray Place 1 spray into both nostrils as needed for congestion. 15 mL 0  . atorvastatin (LIPITOR) 20 MG tablet TAKE 1 TABLET BY MOUTH ONCE DAILY 90 tablet 0  . hydrochlorothiazide (HYDRODIURIL) 25 MG tablet TAKE 1 TABLET BY MOUTH ONCE DAILY 30 tablet 0  . lisinopril (PRINIVIL,ZESTRIL) 5 MG tablet Take 1 tablet (5 mg total) by mouth daily. Need follow up ov before any more refills 90 tablet 0  . potassium chloride SA (KLOR-CON M20) 20 MEQ tablet Take 1 tablet (20 mEq total) by mouth daily. 90 tablet 0  . Canagliflozin-metFORMIN HCl ER (INVOKAMET XR) [phone] MG TB24 Take 2 tablets by mouth daily. (Patient not taking: Reported on 10/01/2017) 60 tablet 3  . fenofibrate (TRICOR) 145 MG tablet Take 1 tablet (145 mg total) by mouth daily. (Patient not taking: Reported on 10/01/2017) 90 tablet 1  . glipiZIDE (GLUCOTROL XL) 2.5 MG 24 hr tablet TAKE 1 TABLET BY MOUTH ONCE DAILY WITH BREAKFAST (Patient not taking: Reported on 10/01/2017) 90 tablet 1  . metFORMIN (GLUCOPHAGE-XR) 500 MG 24 hr tablet Take 4 tablets (2,000 mg total) by mouth daily with breakfast. (Patient not taking: Reported on 10/01/2017) 360 tablet 2   No facility-administered medications prior to visit.     ROS See HPI  Objective:  BP (!) 144/72   Pulse 73   Temp 97.9 F (36.6 C) (Oral)   Ht 5\' 8"  (1.727 m)   Wt 190 lb (86.2 kg)   SpO2 97%   BMI 28.89 kg/m   BP Readings from Last 3 Encounters:  10/01/17 (!) 144/72  09/22/17 129/79  08/27/17 (!) 142/80   Wt Readings from Last 3 Encounters:  10/01/17 190 lb (86.2 kg)  09/22/17 188 lb 1.3 oz (85.3 kg)  08/27/17 191 lb 3.2 oz (86.7 kg)   Physical Exam  Constitutional: She is oriented to person, place, and time. No distress.  Cardiovascular: Normal rate.    Pulmonary/Chest: Effort normal.  Abdominal: Soft. Bowel sounds are normal. She exhibits no distension and no mass. There is no tenderness. There is no guarding. Hernia confirmed negative in the left inguinal area.  Musculoskeletal: She exhibits tenderness.       Left hip: She exhibits tenderness and bony tenderness. She exhibits normal range of motion, normal strength, no crepitus and no deformity.  No pain in gluteal region. Tenderness over left lateral hip joint.  Neurological: She is alert and oriented to person, place, and time.  Skin: Skin is warm and dry. No rash noted.  Psychiatric: She has a normal mood  and affect. Her behavior is normal.  Vitals reviewed.  Lab Results  Component Value Date   WBC 8.5 05/18/2015   HGB 14.2 05/18/2015   HCT 42.3 05/18/2015   PLT 294.0 05/18/2015   GLUCOSE 133 (H) 08/24/2017   CHOL 162 11/23/2016   TRIG 170.0 (H) 11/23/2016   HDL 56.90 11/23/2016   LDLDIRECT 91.0 08/22/2016   LDLCALC 72 11/23/2016   ALT 22 08/24/2017   AST 19 08/24/2017   NA 139 08/24/2017   K 4.0 08/24/2017   CL 101 08/24/2017   CREATININE 0.50 08/24/2017   BUN 15 08/24/2017   CO2 27 08/24/2017   TSH 0.59 10/22/2014   INR 1.19 10/28/2009   HGBA1C 7.6 (H) 08/24/2017   MICROALBUR 0.9 08/24/2017    Assessment & Plan:   Zarinah was seen today for follow-up.  Diagnoses and all orders for this visit:  Left hip pain  Left ovarian cyst  Hyperlipidemia LDL goal <70 -     Cancel: Lipid panel; Future -     atorvastatin (LIPITOR) 20 MG tablet; Take 1 tablet (20 mg total) by mouth daily. -     lisinopril (PRINIVIL,ZESTRIL) 5 MG tablet; Take 1 tablet (5 mg total) by mouth daily. Need follow up ov before any more refills -     Lipid panel; Future  Essential hypertension, benign -     hydrochlorothiazide (HYDRODIURIL) 25 MG tablet; Take 1 tablet (25 mg total) by mouth daily. -     lisinopril (PRINIVIL,ZESTRIL) 5 MG tablet; Take 1 tablet (5 mg total) by mouth daily. Need  follow up ov before any more refills   I have discontinued Demetress E. Westerman's metFORMIN, fenofibrate, and potassium chloride SA. I have also changed her atorvastatin and hydrochlorothiazide. Additionally, I am having her maintain her aspirin, albuterol, Vitamin D-3, Garlic, Coenzyme L57, glucose blood, onetouch ultrasoft, vitamin B-12, ondansetron, sodium chloride, fluticasone, benzonatate, Dulaglutide, glipiZIDE, gabapentin, ibuprofen, oxyCODONE-acetaminophen, Canagliflozin-metFORMIN HCl ER, and lisinopril.  Meds ordered this encounter  Medications  . atorvastatin (LIPITOR) 20 MG tablet    Sig: Take 1 tablet (20 mg total) by mouth daily.    Dispense:  90 tablet    Refill:  3    Order Specific Question:   Supervising Provider    Answer:   Lucille Passy [[id number]]  . hydrochlorothiazide (HYDRODIURIL) 25 MG tablet    Sig: Take 1 tablet (25 mg total) by mouth daily.    Dispense:  90 tablet    Refill:  1    Keep appt with PCP in 07/19 for more refills.    Order Specific Question:   Supervising Provider    Answer:   Lucille Passy [[id number]]  . lisinopril (PRINIVIL,ZESTRIL) 5 MG tablet    Sig: Take 1 tablet (5 mg total) by mouth daily. Need follow up ov before any more refills    Dispense:  90 tablet    Refill:  3    Order Specific Question:   Supervising Provider    Answer:   Lucille Passy [[id number]]    Follow-up: Return in about 6 months (around 04/03/2018) for DM and HTN, hyperlipidemia (fasting).  Wilfred Lacy, NP

## 2017-10-01 NOTE — Assessment & Plan Note (Signed)
Incidental finding during evaluation of left hip pain by ortho. Initially seen on Hip MRI. Then followed by pelvic US and Pelvic MRI. repeat pelvic Mri in 49months was recommended, but she will like mass removed. Also had negative CA125. She had difficulty reaching with Dr. Hulan Fray (GYN), so she made appt with Dr. Ronita Hipps  ([facility]) on 10/31/2017. She does not want to wait 83months to repeat MRI. Fhx of breast cancer (sister and daughter) No Fhx of ovarian and/or colon cancer.

## 2017-10-02 ENCOUNTER — Ambulatory Visit: Payer: Medicare Other | Admitting: Obstetrics & Gynecology

## 2017-10-10 ENCOUNTER — Telehealth: Payer: Self-pay

## 2017-10-10 NOTE — Telephone Encounter (Signed)
Received shipment from [place]. It is 5 boxes of Trulicity. Pt called and notified.

## 2017-10-12 NOTE — Telephone Encounter (Signed)
Pt came into office today and picked up her medication that was sent from [facility].

## 2017-10-23 ENCOUNTER — Other Ambulatory Visit: Payer: Self-pay | Admitting: Nurse Practitioner

## 2017-10-23 ENCOUNTER — Other Ambulatory Visit: Payer: Self-pay | Admitting: Family Medicine

## 2017-10-23 DIAGNOSIS — E876 Hypokalemia: Secondary | ICD-10-CM

## 2017-10-23 DIAGNOSIS — E1151 Type 2 diabetes mellitus with diabetic peripheral angiopathy without gangrene: Secondary | ICD-10-CM

## 2017-11-06 ENCOUNTER — Telehealth: Payer: Self-pay | Admitting: Endocrinology

## 2017-11-06 ENCOUNTER — Telehealth: Payer: Self-pay | Admitting: Nurse Practitioner

## 2017-11-06 DIAGNOSIS — E1151 Type 2 diabetes mellitus with diabetic peripheral angiopathy without gangrene: Secondary | ICD-10-CM

## 2017-11-06 DIAGNOSIS — E876 Hypokalemia: Secondary | ICD-10-CM

## 2017-11-06 DIAGNOSIS — I10 Essential (primary) hypertension: Secondary | ICD-10-CM

## 2017-11-06 DIAGNOSIS — E785 Hyperlipidemia, unspecified: Secondary | ICD-10-CM

## 2017-11-06 MED ORDER — ATORVASTATIN CALCIUM 20 MG PO TABS: 20.0000 mg | ORAL_TABLET | Freq: Every day | ORAL | 3 refills | Status: DC

## 2017-11-06 MED ORDER — HYDROCHLOROTHIAZIDE 25 MG PO TABS: 25.0000 mg | ORAL_TABLET | Freq: Every day | ORAL | 1 refills | Status: DC

## 2017-11-06 MED ORDER — LISINOPRIL 5 MG PO TABS: 5.0000 mg | ORAL_TABLET | Freq: Every day | ORAL | 3 refills | Status: DC

## 2017-11-06 MED ORDER — POTASSIUM CHLORIDE CRYS ER 20 MEQ PO TBCR: 20.0000 meq | EXTENDED_RELEASE_TABLET | Freq: Every day | ORAL | 1 refills | Status: DC

## 2017-11-06 NOTE — Telephone Encounter (Signed)
Patient is able to get $0 copay on 90 day supply of Glipizide ER 2.5 mg through [facility]. [facility] will be sending forms to our office. Patient wants them approved  And is very excited to get $0 copay.

## 2017-11-06 NOTE — Telephone Encounter (Signed)
Patient would like Gabapentin sent to [facility], 90 day supply.   Last refilled on 10/23/17 #270 cap/3 refill sent to [facility]. Last OV:10/01/17 HID:[facility]BD Pharmacy:  [facility], [place] [phone] (Phone) [phone] (Fax)

## 2017-11-06 NOTE — Telephone Encounter (Signed)
Copied from [place]. Topic: Quick Communication - Rx Refill/Question >> Nov 06, 2017  2:41 PM Neva Seat wrote: potassium chloride SA (K-DUR,KLOR-CON) 20 MEQ tablet hydrochlorothiazide (HYDRODIURIL) 25 MG tablet atorvastatin (LIPITOR) 20 MG tablet lisinopril (PRINIVIL,ZESTRIL) 5 MG tablet gabapentin (NEURONTIN) 300 MG capsule   [facility] will be sending over a request for 90 day refills on the above medications for pt.

## 2017-11-06 NOTE — Telephone Encounter (Signed)
Copied from [place] [phone]. Topic: Quick Communication - Rx Refill/Question >> Nov 06, 2017  2:35 PM Neva Seat wrote: potassium chloride SA (K-DUR,KLOR-CON) 20 MEQ tablet hydrochlorothiazide (HYDRODIURIL) 25 MG tablet atorvastatin (LIPITOR) 20 MG tablet lisinopril (PRINIVIL,ZESTRIL) 5 MG tablet gabapentin (NEURONTIN) 300 MG capsule  [facility] will be sending over a request for 90 day refills on medications. [facility]

## 2017-11-06 NOTE — Telephone Encounter (Signed)
Please advise, ok to send rx in for 90 day supply. Pt has an appt to follow up with [facility] on 03/2018. Last lipid,CMET was 11/2017

## 2017-11-07 ENCOUNTER — Other Ambulatory Visit: Payer: Self-pay

## 2017-11-07 MED ORDER — GABAPENTIN 300 MG PO CAPS: 300.0000 mg | ORAL_CAPSULE | Freq: Three times a day (TID) | ORAL | 2 refills | Status: DC

## 2017-11-07 MED ORDER — GLIPIZIDE ER 2.5 MG PO TB24: 2.5000 mg | ORAL_TABLET | Freq: Every day | ORAL | 3 refills | Status: DC

## 2017-11-07 NOTE — Telephone Encounter (Signed)
Ok to send prescriptions to [facility]. Discontinue prescriptions at [facility]

## 2017-11-07 NOTE — Telephone Encounter (Signed)
Rx cancel from [facility] for gabapentin and resend to [facility].   Other meds requested sent to [facility] 11/06/17.

## 2017-11-13 ENCOUNTER — Other Ambulatory Visit: Payer: Self-pay | Admitting: Emergency Medicine

## 2017-11-13 MED ORDER — GLIPIZIDE ER 2.5 MG PO TB24: 2.5000 mg | ORAL_TABLET | Freq: Every day | ORAL | 0 refills | Status: DC

## 2017-11-13 NOTE — Telephone Encounter (Signed)
90 supply sent to [facility].

## 2017-11-22 ENCOUNTER — Encounter: Payer: Self-pay | Admitting: Family Medicine

## 2017-11-22 ENCOUNTER — Ambulatory Visit (INDEPENDENT_AMBULATORY_CARE_PROVIDER_SITE_OTHER): Payer: Medicare Other | Admitting: Family Medicine

## 2017-11-22 VITALS — BP 138/74 | HR 88 | Temp 98.1°F | Ht 68.0 in | Wt 188.0 lb

## 2017-11-22 DIAGNOSIS — J0100 Acute maxillary sinusitis, unspecified: Secondary | ICD-10-CM | POA: Diagnosis not present

## 2017-11-22 MED ORDER — PROMETHAZINE-DM 6.25-15 MG/5ML PO SYRP: 5.0000 mL | ORAL_SOLUTION | Freq: Four times a day (QID) | ORAL | 0 refills | Status: DC | PRN

## 2017-11-22 MED ORDER — DOXYCYCLINE HYCLATE 100 MG PO TABS: 100.0000 mg | ORAL_TABLET | Freq: Two times a day (BID) | ORAL | 0 refills | Status: DC

## 2017-11-22 MED ORDER — PREDNISONE 20 MG PO TABS: 20.0000 mg | ORAL_TABLET | Freq: Every day | ORAL | 0 refills | Status: DC

## 2017-11-22 NOTE — Assessment & Plan Note (Addendum)
Purulent sputum with increased sinus pain consistent with acute sinusitis Rx for doxycycline Mild wheezing on exam, likely has component of RAD.  Short course of prednisone and has albuterol at home. Promethazine DM cough syrup.  Tessalon perles provided no relief.  Reviewed red flag signs and she will contact us if not improving.

## 2017-11-22 NOTE — Patient Instructions (Signed)

## 2017-11-22 NOTE — Progress Notes (Signed)
Sandra Ray - 72 y.o. female MRN [id number]  Date of birth: 08-08-1945  Subjective Chief Complaint  Patient presents with  . Cough    Non-productive-ongoing for three days-denies fevers or body aches. She has not taken anyting for her symptoms. Admits to sinus pressure    HPI Sandra Ray is a a 72 y.o. female here today with complaint of cough with sinus pain and post nasal drainage.  Symptoms began about 3 days ago and have been worsening since that time.  Cough has been productive for thick, yellow sputum.  She has been unable to blow much from her nose. She denies fever, chills, ear pain/pressure, sore throat, wheezing or shortness of breath.  Reports that this often will progress to bronchitis if not treated early.  Tried tessalon perles for cough previously without relief.   ROS:  A comprehensive ROS was completed and negative except as noted per HPI  Allergies  Allergen Reactions  . Penicillins     Causes yeast infections  . Amoxicillin Other (See Comments)    Pt states it causes her a yeast infection   . Erythromycin     Passed out while taking    Past Medical History:  Diagnosis Date  . Arthritis   . Breast abscess    right breast  . Carpal tunnel syndrome   . Degenerative disc disease   . Diabetes mellitus type II   . Eczema of hand   . Hx of colonic polyps 12/02/2014   2004 - 2 diminutive polyps   . Hyperlipidemia   . Hypertension   . Metatarsal deformity   . Shingles   . Tubular adenoma of colon     Past Surgical History:  Procedure Laterality Date  . ABDOMINAL HYSTERECTOMY  1981  . ABSCESS DRAINAGE Right    breast  . APPENDECTOMY  1966  . BREAST BIOPSY Left   . carpel tunnel Right   . CESAREAN SECTION     x2  . COLONOSCOPY    . LUMBAR LAMINECTOMY     [id number]. 2011 L4,L5,S1  . Right rotator cuff repair 09/2015    . TUBAL LIGATION      Social History   Socioeconomic History  . Marital status: Married    Spouse name: Not on file  . Number of  children: 2  . Years of education: Not on file  . Highest education level: Not on file  Occupational History  . Not on file  Social Needs  . Financial resource strain: Not on file  . Food insecurity:    Worry: Not on file    Inability: Not on file  . Transportation needs:    Medical: Not on file    Non-medical: Not on file  Tobacco Use  . Smoking status: Never Smoker  . Smokeless tobacco: Never Used  Substance and Sexual Activity  . Alcohol use: No  . Drug use: No  . Sexual activity: Not Currently  Lifestyle  . Physical activity:    Days per week: Not on file    Minutes per session: Not on file  . Stress: Not on file  Relationships  . Social connections:    Talks on phone: Not on file    Gets together: Not on file    Attends religious service: Not on file    Active member of club or organization: Not on file    Attends meetings of clubs or organizations: Not on file    Relationship status: Not on  file  Other Topics Concern  . Not on file  Social History Narrative   Married 2 daughters   Not working outside home   no caffeine   12/02/2014    Family History  Problem Relation Age of Onset  . Hypertension Father   . Diabetes Mother   . Stroke Mother   . Hypertension Mother   . High Cholesterol Mother   . Heart disease Mother   . Diabetes Brother   . Breast cancer Daughter   . Breast cancer Maternal Aunt   . Breast cancer Sister   . Colon polyps Brother     Health Maintenance  Topic Date Due  . OPHTHALMOLOGY EXAM  05/11/2017  . INFLUENZA VACCINE  10/11/2017  . HEMOGLOBIN A1C  02/23/2018  . FOOT EXAM  08/28/2018  . MAMMOGRAM  01/12/2019  . COLONOSCOPY  01/18/2025  . TETANUS/TDAP  01/29/2025  . DEXA SCAN  Completed  . Hepatitis C Screening  Completed  . PNA vac Low Risk Adult  Completed     ----------------------------------------------------------------------------------------------------------------------------------------------------------------------------------------------------------------- Physical Exam BP 138/74   Pulse 88   Temp 98.1 F (36.7 C)   Ht 5\' 8"  (1.727 m)   Wt 188 lb (85.3 kg)   SpO2 98%   BMI 28.59 kg/m   Physical Exam  Constitutional: She is oriented to person, place, and time. She appears well-nourished. No distress.  HENT:  Head: Normocephalic and atraumatic.  Mouth/Throat: Oropharynx is clear and moist.  Maxillary sinus tenderness   Eyes: No scleral icterus.  Neck: Neck supple. No thyromegaly present.  Cardiovascular: Normal rate, regular rhythm and normal heart sounds.  Pulmonary/Chest: Effort normal. She has wheezes (mild expiratory wheezes).  Musculoskeletal: She exhibits no edema.  Lymphadenopathy:    She has no cervical adenopathy.  Neurological: She is alert and oriented to person, place, and time.  Skin: Skin is dry.  Psychiatric: She has a normal mood and affect. Her behavior is normal.    ------------------------------------------------------------------------------------------------------------------------------------------------------------------------------------------------------------------- Assessment and Plan  Acute non-recurrent maxillary sinusitis Purulent sputum with increased sinus pain consistent with acute sinusitis Rx for doxycycline Mild wheezing on exam, likely has component of RAD.  Short course of prednisone and has albuterol at home. Promethazine DM cough syrup.  Tessalon perles provided no relief.  Reviewed red flag signs and she will contact us if not improving.

## 2017-11-23 ENCOUNTER — Other Ambulatory Visit: Payer: Medicare Other

## 2017-11-27 ENCOUNTER — Ambulatory Visit: Payer: Medicare Other | Admitting: Endocrinology

## 2017-12-14 ENCOUNTER — Ambulatory Visit: Payer: Medicare Other | Admitting: Nurse Practitioner

## 2017-12-14 ENCOUNTER — Encounter: Payer: Self-pay | Admitting: Nurse Practitioner

## 2017-12-14 VITALS — BP 140/70 | HR 80 | Temp 97.6°F | Ht 68.0 in | Wt 186.0 lb

## 2017-12-14 DIAGNOSIS — N3000 Acute cystitis without hematuria: Secondary | ICD-10-CM

## 2017-12-14 DIAGNOSIS — R350 Frequency of micturition: Secondary | ICD-10-CM | POA: Diagnosis not present

## 2017-12-14 LAB — POCT URINALYSIS DIPSTICK
Bilirubin, UA: NEGATIVE
Glucose, UA: POSITIVE — AB
Ketones, UA: NEGATIVE
NITRITE UA: NEGATIVE
PROTEIN UA: POSITIVE — AB
SPEC GRAV UA: 1.015 (ref 1.010–1.025)
Urobilinogen, UA: 0.2 E.U./dL
pH, UA: 7 (ref 5.0–8.0)

## 2017-12-14 MED ORDER — CIPROFLOXACIN HCL 250 MG PO TABS: 250.0000 mg | ORAL_TABLET | Freq: Two times a day (BID) | ORAL | 0 refills | Status: DC

## 2017-12-14 NOTE — Progress Notes (Signed)
Subjective:  Patient ID: Sandra Ray, female    DOB: 1945-06-28  Age: 72 y.o. MRN: [id number]  CC: Urinary Tract Infection (frequent urinate,painful,bladder and back discomfort. going on 2 days. took otc cystex. flu shot?)  Urinary Tract Infection   This is a new problem. The current episode started in the past 7 days. The problem has been gradually worsening. The quality of the pain is described as burning. The patient is experiencing no pain. There has been no fever. She is not sexually active. There is no history of pyelonephritis. Associated symptoms include chills, frequency, hesitancy and urgency. Pertinent negatives include no discharge, flank pain, hematuria, nausea, possible pregnancy, sweats or vomiting. She has tried increased fluids for the symptoms. Her past medical history is significant for urinary stasis. There is no history of catheterization, kidney stones, recurrent UTIs, a single kidney or a urological procedure.   Reviewed past Medical, Social and Family history today.  Outpatient Medications Prior to Visit  Medication Sig Dispense Refill  . Acetaminophen (TYLENOL 8 HOUR PO) Take by mouth.    Marland Kitchen albuterol (VENTOLIN HFA) 108 (90 BASE) MCG/ACT inhaler Inhale 2 puffs into the lungs every 6 (six) hours as needed.      Marland Kitchen aspirin 81 MG tablet Take 81 mg by mouth daily.      Marland Kitchen atorvastatin (LIPITOR) 20 MG tablet Take 1 tablet (20 mg total) by mouth daily. 90 tablet 3  . Cholecalciferol (VITAMIN D-3) 5000 UNITS TABS Take by mouth daily.     . Coenzyme Q10 100 MG TABS Take 400 mg by mouth daily.    . cyanocobalamin 500 MCG tablet Take 500 mcg by mouth daily.    . dapagliflozin propanediol (FARXIGA) 10 MG TABS tablet Take 10 mg by mouth daily.    . Dulaglutide (TRULICITY) 2.20 UR/4.2HC SOPN Inject 0.75 mg into the skin once a week. 12 pen 3  . fluticasone (FLONASE) 50 MCG/ACT nasal spray Place 2 sprays into both nostrils daily. 16 g 0  . gabapentin (NEURONTIN) 300 MG capsule Take 1  capsule (300 mg total) by mouth 3 (three) times daily. 270 capsule 2  . Garlic 6237 MG CAPS Take 1 capsule by mouth daily.     Marland Kitchen glipiZIDE (GLUCOTROL XL) 2.5 MG 24 hr tablet Take 1 tablet (2.5 mg total) by mouth daily with breakfast. 90 tablet 0  . glucose blood (ONE TOUCH ULTRA TEST) test strip Use as instructed once a day.  DX code E11.40 100 each 5  . hydrochlorothiazide (HYDRODIURIL) 25 MG tablet Take 1 tablet (25 mg total) by mouth daily. 90 tablet 1  . Lancets (ONETOUCH ULTRASOFT) lancets Use as instructed once day.  E11.40 100 each 5  . lisinopril (PRINIVIL,ZESTRIL) 5 MG tablet Take 1 tablet (5 mg total) by mouth daily. 90 tablet 3  . ondansetron (ZOFRAN) 4 MG tablet Take 1 tablet (4 mg total) by mouth as needed for nausea or vomiting. 20 tablet 0  . potassium chloride SA (K-DUR,KLOR-CON) 20 MEQ tablet Take 1 tablet (20 mEq total) by mouth daily. 90 tablet 1  . doxycycline (VIBRA-TABS) 100 MG tablet Take 1 tablet (100 mg total) by mouth 2 (two) times daily. (Patient not taking: Reported on 12/14/2017) 20 tablet 0  . ibuprofen (ADVIL,MOTRIN) 600 MG tablet Take 1 tablet (600 mg total) by mouth every 6 (six) hours as needed. (Patient not taking: Reported on 12/14/2017) 30 tablet 0  . oxyCODONE-acetaminophen (PERCOCET) 5-325 MG tablet Take 1 tablet by mouth every 4 (  four) hours as needed for severe pain. (Patient not taking: Reported on 12/14/2017) 30 tablet 0  . predniSONE (DELTASONE) 20 MG tablet Take 1 tablet (20 mg total) by mouth daily with breakfast. (Patient not taking: Reported on 12/14/2017) 5 tablet 0  . promethazine-dextromethorphan (PROMETHAZINE-DM) 6.25-15 MG/5ML syrup Take 5 mLs by mouth 4 (four) times daily as needed for cough. (Patient not taking: Reported on 12/14/2017) 125 mL 0  . sodium chloride (OCEAN) 0.65 % SOLN nasal spray Place 1 spray into both nostrils as needed for congestion. (Patient not taking: Reported on 12/14/2017) 15 mL 0   No facility-administered medications prior to  visit.     ROS See HPI  Objective:  BP 140/70   Pulse 80   Temp 97.6 F (36.4 C) (Oral)   Ht 5\' 8"  (1.727 m)   Wt 186 lb (84.4 kg)   SpO2 96%   BMI 28.28 kg/m   BP Readings from Last 3 Encounters:  12/14/17 140/70  11/22/17 138/74  10/01/17 (!) 144/72    Wt Readings from Last 3 Encounters:  12/14/17 186 lb (84.4 kg)  11/22/17 188 lb (85.3 kg)  10/01/17 190 lb (86.2 kg)    Physical Exam  Abdominal: Soft. Bowel sounds are normal. There is tenderness.  No flank pain Suprapubic tenderness  Vitals reviewed.   Lab Results  Component Value Date   WBC 8.5 05/18/2015   HGB 14.2 05/18/2015   HCT 42.3 05/18/2015   PLT 294.0 05/18/2015   GLUCOSE 133 (H) 08/24/2017   CHOL 162 11/23/2016   TRIG 170.0 (H) 11/23/2016   HDL 56.90 11/23/2016   LDLDIRECT 91.0 08/22/2016   LDLCALC 72 11/23/2016   ALT 22 08/24/2017   AST 19 08/24/2017   NA 139 08/24/2017   K 4.0 08/24/2017   CL 101 08/24/2017   CREATININE 0.50 08/24/2017   BUN 15 08/24/2017   CO2 27 08/24/2017   TSH 0.59 10/22/2014   INR 1.19 10/28/2009   HGBA1C 7.6 (H) 08/24/2017   MICROALBUR 0.9 08/24/2017    No results found.  Assessment & Plan:   Sandra Ray was seen today for urinary tract infection.  Diagnoses and all orders for this visit:  Acute cystitis without hematuria -     POCT urinalysis dipstick -     ciprofloxacin (CIPRO) 250 MG tablet; Take 1 tablet (250 mg total) by mouth 2 (two) times daily. -     Urine Culture  Frequent urination -     POCT urinalysis dipstick -     ciprofloxacin (CIPRO) 250 MG tablet; Take 1 tablet (250 mg total) by mouth 2 (two) times daily. -     Urine Culture   I have discontinued Sandra Ray's sodium chloride, ibuprofen, oxyCODONE-acetaminophen, promethazine-dextromethorphan, doxycycline, and predniSONE. I am also having her start on ciprofloxacin. Additionally, I am having her maintain her aspirin, albuterol, Vitamin D-3, Garlic, Coenzyme J88, glucose blood, onetouch  ultrasoft, vitamin B-12, ondansetron, fluticasone, Dulaglutide, potassium chloride SA, hydrochlorothiazide, atorvastatin, lisinopril, gabapentin, glipiZIDE, dapagliflozin propanediol, and Acetaminophen (TYLENOL 8 HOUR PO).  Meds ordered this encounter  Medications  . ciprofloxacin (CIPRO) 250 MG tablet    Sig: Take 1 tablet (250 mg total) by mouth 2 (two) times daily.    Dispense:  10 tablet    Refill:  0    Order Specific Question:   Supervising Provider    Answer:   MATTHEWS, CODY [[id number]]    Follow-up: No follow-ups on file.  Wilfred Lacy, NP

## 2017-12-14 NOTE — Patient Instructions (Signed)
Acute Urinary Retention, Female Urinary retention means you are unable to pee completely or at all (empty your bladder). Follow these instructions at home:  Drink enough fluids to keep your pee (urine) clear or pale yellow.  If you are sent home with a tube that drains the bladder (catheter), there will be a drainage bag attached to it. There are two types of bags. One is big that you can wear at night without having to empty it. One is smaller and needs to be emptied more often.  Keep the drainage bag emptied.  Keep the drainage bag lower than the tube.  Only take medicine as told by your doctor. Contact a doctor if:  You have a low-grade fever.  You have spasms or you are leaking pee when you have spasms. Get help right away if:  You have chills or a fever.  Your catheter stops draining pee.  Your catheter falls out.  You have increased bleeding that does not stop after you have rested and increased the amount of fluids you had been drinking. This information is not intended to replace advice given to you by your health care provider. Make sure you discuss any questions you have with your health care provider. Document Released: 08/16/2007 Document Revised: 08/05/2015 Document Reviewed: 08/08/2012 Elsevier Interactive Patient Education  2017 [employer]vier Inc.  

## 2017-12-17 LAB — URINE CULTURE
MICRO NUMBER:: 91195626
SPECIMEN QUALITY:: ADEQUATE

## 2018-01-01 LAB — HM DIABETES EYE EXAM

## 2018-01-03 ENCOUNTER — Other Ambulatory Visit: Payer: Self-pay

## 2018-01-03 MED ORDER — GLIPIZIDE ER 2.5 MG PO TB24: 2.5000 mg | ORAL_TABLET | Freq: Every day | ORAL | 0 refills | Status: DC

## 2018-01-17 ENCOUNTER — Telehealth: Payer: Self-pay | Admitting: Endocrinology

## 2018-01-17 NOTE — Telephone Encounter (Signed)
Received shipment from [facility] 5 box of trulicity. Notified pt and ready to be pick up.

## 2018-01-18 ENCOUNTER — Encounter: Payer: Self-pay | Admitting: Nurse Practitioner

## 2018-02-06 ENCOUNTER — Other Ambulatory Visit (INDEPENDENT_AMBULATORY_CARE_PROVIDER_SITE_OTHER): Payer: Medicare Other

## 2018-02-06 DIAGNOSIS — E785 Hyperlipidemia, unspecified: Secondary | ICD-10-CM

## 2018-02-06 DIAGNOSIS — E1165 Type 2 diabetes mellitus with hyperglycemia: Secondary | ICD-10-CM

## 2018-02-06 LAB — BASIC METABOLIC PANEL
BUN: 16 mg/dL (ref 6–23)
CALCIUM: 10.2 mg/dL (ref 8.4–10.5)
CHLORIDE: 98 meq/L (ref 96–112)
CO2: 28 mEq/L (ref 19–32)
CREATININE: 0.56 mg/dL (ref 0.40–1.20)
GFR: 112.93 mL/min (ref >60.00)
Glucose, Bld: 136 mg/dL — ABNORMAL HIGH (ref 70–99)
POTASSIUM: 4 meq/L (ref 3.5–5.1)
SODIUM: 137 meq/L (ref 135–145)

## 2018-02-06 LAB — LIPID PANEL
CHOLESTEROL: 154 mg/dL (ref 0–200)
HDL: 47.9 mg/dL (ref >39.00)
NonHDL: 105.67
Total CHOL/HDL Ratio: 3
Triglycerides: 236 mg/dL — ABNORMAL HIGH (ref 0.0–149.0)
VLDL: 47.2 mg/dL — AB (ref 0.0–40.0)

## 2018-02-06 LAB — HEMOGLOBIN A1C: Hgb A1c MFr Bld: 8.1 % — ABNORMAL HIGH (ref 4.6–6.5)

## 2018-02-06 LAB — LDL CHOLESTEROL, DIRECT: Direct LDL: 80 mg/dL

## 2018-02-11 ENCOUNTER — Other Ambulatory Visit: Payer: Self-pay | Admitting: Nurse Practitioner

## 2018-02-11 DIAGNOSIS — E785 Hyperlipidemia, unspecified: Secondary | ICD-10-CM

## 2018-02-11 DIAGNOSIS — Z1231 Encounter for screening mammogram for malignant neoplasm of breast: Secondary | ICD-10-CM

## 2018-02-11 MED ORDER — ATORVASTATIN CALCIUM 40 MG PO TABS: 40.0000 mg | ORAL_TABLET | Freq: Every day | ORAL | 1 refills | Status: DC

## 2018-02-11 MED ORDER — OMEGA-3-ACID ETHYL ESTERS 1 G PO CAPS: 1.0000 g | ORAL_CAPSULE | Freq: Two times a day (BID) | ORAL | 5 refills | Status: DC

## 2018-02-12 ENCOUNTER — Ambulatory Visit
Admission: RE | Admit: 2018-02-12 | Discharge: 2018-02-12 | Disposition: A | Discharge status: Home / Self Care | Payer: Medicare Other | Source: Ambulatory Visit | Attending: Nurse Practitioner | Admitting: Nurse Practitioner

## 2018-02-12 DIAGNOSIS — Z1231 Encounter for screening mammogram for malignant neoplasm of breast: Secondary | ICD-10-CM

## 2018-02-13 ENCOUNTER — Ambulatory Visit: Payer: Self-pay | Admitting: *Deleted

## 2018-02-13 NOTE — Telephone Encounter (Signed)
Charlotte please advise 

## 2018-02-13 NOTE — Telephone Encounter (Signed)
Alternative will be OTC fish oil capsules: take 2caps BID.

## 2018-02-13 NOTE — Telephone Encounter (Signed)
Summary: medication inquiry   Pt called in wanting to have a call back from her provider's nurse. She says there was a medication; omega-3 acid ethyl esters (LOVAZA) 1 g capsule sent over to the pharmacy that she was not aware that she should be taking. She says the amount of the medication is too high and she will not pay for it.     Patient has talked to her pharmacy and the medication is going to cost her $95-100 dollars. Is there something else that is less expensive. Patient ask if there is- can it be sent to [facility] because [facility] does not let her know the cost of new Rx's- they just charge and send.  Reason for Disposition . Caller has NON-URGENT medication question about med that PCP prescribed and triager unable to answer question  Protocols used: MEDICATION QUESTION CALL-A-AH

## 2018-02-13 NOTE — Progress Notes (Signed)
Patient ID: Sandra Ray, female   DOB: Apr 04, 1945, 72 y.o.   MRN: [id number]            Reason for Appointment: Follow-up for Type 2 Diabetes  Referring physician: Etter Sjogren   History of Present Illness:          Date of diagnosis of type 2 diabetes mellitus: 1996       Background history:   She had a high blood sugar on lab work which diagnosed her diabetes and she was most likely treated with metformin for a few years She has also taking glipizide for several years She was tried on Actos which was started because of significant weight gain. Over the last few years she has been taking either  Januvia or Tradjenta with her metformin in combination with fair control Her A1c has been below 8% only twice in the last 2-3 years  Recent history:   Her A1c appears to be getting progressively higher this year and now up to 8.1  Non-insulin hypoglycemic drugs: Glipizide ER 2.5 mg, Trulicity 7.61 mg weekly Xigduo 07/998, 2 tablets daily  Current management, blood sugar patterns and problems identified:  She has been able to switch to [place] even though it was apparently not covered previously  She did leave it off for a week when she had difficulty with significant  However taking other medications Xigduo regularly now  Also has less nausea with this compared to metformin separately  Unclear why her A1c is higher because her fasting blood sugars are almost the same as on the previous visit  She thinks her diet has not been good and eating too much or the wrong things especially later in the day  She has done no readings after meals as directed  No recent cortisone shots, but previously did have some intercurrent infections  She does appear to have lost weight.  She apparently does get diarrhea about once a week, she believes this is the day after she takes her Trulicity        Side effects from medications have been: Weight gain from Actos, nausea from high dose  metformin   Glucose monitoring:  done <1 times a day         Glucometer: One Touch.       Blood Glucose readings by time of day and averages from meter as above   PRE-MEAL Fasting Lunch Dinner Bedtime Overall  Glucose range:  122-167      Mean/median:  140       POST-MEAL PC Breakfast PC Lunch PC Dinner  Glucose range:    2 Mark  Mean/median:        Self-care: The diet that the patient has been following is: tries to limit fried food.     Typical meal intake: Breakfast is cereal, egg, almond milk,  lunch [place] burger and salad, dinner is soup and crackers and vegetables.  Snacks are usually small apple                Dietician visit, most recent: 07/13/16               Exercise:  Irregular  Weight history: Previous range 200-254  Wt Readings from Last 3 Encounters:  02/14/18 184 lb 6.4 oz (83.6 kg)  12/14/17 186 lb (84.4 kg)  11/22/17 188 lb (85.3 kg)    Glycemic control:   Lab Results  Component Value Date   HGBA1C 8.1 (H) 02/06/2018   HGBA1C 7.6 (H)  08/24/2017   HGBA1C 6.6 (H) 03/16/2017   Lab Results  Component Value Date   MICROALBUR 0.9 08/24/2017   LDLCALC 72 11/23/2016   CREATININE 0.56 02/06/2018   Lab Results  Component Value Date   MICRALBCREAT 1.2 08/24/2017    Lab Results  Component Value Date   FRUCTOSAMINE 328 (H) 07/07/2016      Allergies as of 02/14/2018      Reactions   Penicillins    Causes yeast infections   Amoxicillin Other (See Comments)   Pt states it causes her a yeast infection    Erythromycin    Passed out while taking      Medication List        Accurate as of 02/14/18  8:34 AM. Always use your most recent med list.          aspirin 81 MG tablet Take 81 mg by mouth daily.   atorvastatin 40 MG tablet Commonly known as:  LIPITOR Take 1 tablet (40 mg total) by mouth daily at 6 PM.   Coenzyme Q10 100 MG Tabs Take 400 mg by mouth daily.   Dulaglutide 0.75 MG/0.5ML Sopn Inject 0.75 mg into the skin once a  week.   fluticasone 50 MCG/ACT nasal spray Commonly known as:  FLONASE Place 2 sprays into both nostrils daily.   gabapentin 300 MG capsule Commonly known as:  NEURONTIN Take 1 capsule (300 mg total) by mouth 3 (three) times daily.   Garlic 4098 MG Caps Take 1 capsule by mouth daily.   glipiZIDE 2.5 MG 24 hr tablet Commonly known as:  GLUCOTROL XL Take 1 tablet (2.5 mg total) by mouth daily with breakfast.   glucose blood test strip Use as instructed once a day.  DX code E11.40   hydrochlorothiazide 25 MG tablet Commonly known as:  HYDRODIURIL Take 1 tablet (25 mg total) by mouth daily.   lisinopril 5 MG tablet Commonly known as:  PRINIVIL,ZESTRIL Take 1 tablet (5 mg total) by mouth daily.   omega-3 acid ethyl esters 1 g capsule Commonly known as:  LOVAZA Take 1 capsule (1 g total) by mouth 2 (two) times daily.   ondansetron 4 MG tablet Commonly known as:  ZOFRAN Take 1 tablet (4 mg total) by mouth as needed for nausea or vomiting.   onetouch ultrasoft lancets Use as instructed once day.  E11.40   potassium chloride SA 20 MEQ tablet Commonly known as:  K-DUR,KLOR-CON Take 1 tablet (20 mEq total) by mouth daily.   TYLENOL 8 HOUR PO Take 650 mg by mouth.   VENTOLIN HFA 108 (90 Base) MCG/ACT inhaler Generic drug:  albuterol Inhale 2 puffs into the lungs every 6 (six) hours as needed.   vitamin B-12 500 MCG tablet Commonly known as:  CYANOCOBALAMIN Take 500 mcg by mouth daily.   Vitamin D-3 125 MCG (5000 UT) Tabs Take by mouth daily.   XIGDUO XR 07-998 MG Tb24 Generic drug:  Dapagliflozin-metFORMIN HCl ER Take 1 tablet by mouth. Take 1 tablet by mouth twice daily.       Allergies:  Allergies  Allergen Reactions  . Penicillins     Causes yeast infections  . Amoxicillin Other (See Comments)    Pt states it causes her a yeast infection   . Erythromycin     Passed out while taking    Past Medical History:  Diagnosis Date  . Arthritis   . Breast  abscess    right breast  . Carpal tunnel syndrome   .  Degenerative disc disease   . Diabetes mellitus type II   . Eczema of hand   . Hx of colonic polyps 12/02/2014   2004 - 2 diminutive polyps   . Hyperlipidemia   . Hypertension   . Metatarsal deformity   . Shingles   . Tubular adenoma of colon     Past Surgical History:  Procedure Laterality Date  . ABDOMINAL HYSTERECTOMY  1981  . ABSCESS DRAINAGE Right    breast  . APPENDECTOMY  1966  . BREAST BIOPSY Left   . carpel tunnel Right   . CESAREAN SECTION     x2  . COLONOSCOPY    . LUMBAR LAMINECTOMY     [id number]. 2011 L4,L5,S1  . Right rotator cuff repair 09/2015    . TUBAL LIGATION      Family History  Problem Relation Age of Onset  . Hypertension Father   . Diabetes Mother   . Stroke Mother   . Hypertension Mother   . High Cholesterol Mother   . Heart disease Mother   . Diabetes Brother   . Breast cancer Daughter   . Breast cancer Maternal Aunt   . Breast cancer Sister   . Colon polyps Brother     Social History:  reports that she has never smoked. She has never used smokeless tobacco. She reports that she does not drink alcohol or use drugs.   Review of Systems   Lipid history: Has had high triglycerides and LDL, LDL below 100 This is managed by her PCP. Unclear why her fenofibrate has been stopped, her triglycerides were better previously She is supposed to start Lovaza but she thinks it is too expensive   Lab Results  Component Value Date   CHOL 154 02/06/2018   HDL 47.90 02/06/2018   LDLCALC 72 11/23/2016   LDLDIRECT 80.0 02/06/2018   TRIG 236.0 (H) 02/06/2018   CHOLHDL 3 02/06/2018           Hypertension:Mild and controlled with 5 mg Lisinopril and 25 mg HCTZ prescribed by  PCP  BP Readings from Last 3 Encounters:  02/14/18 140/80  12/14/17 140/70  11/22/17 138/74    Most recent eye exam was 10/19  Most recent foot exam: 08/2017 Has evidence of neuropathy on exam  Take Zofran as  needed for nausea   LABS:  No visits with results within 1 Week(s) from this visit.  Latest known visit with results is:  Lab on 02/06/2018  Component Date Value Ref Range Status  . Sodium 02/06/2018 137  135 - 145 mEq/L Final  . Potassium 02/06/2018 4.0  3.5 - 5.1 mEq/L Final  . Chloride 02/06/2018 98  96 - 112 mEq/L Final  . CO2 02/06/2018 28  19 - 32 mEq/L Final  . Glucose, Bld 02/06/2018 136* 70 - 99 mg/dL Final  . BUN 02/06/2018 16  6 - 23 mg/dL Final  . Creatinine, Ser 02/06/2018 0.56  0.40 - 1.20 mg/dL Final  . Calcium 02/06/2018 10.2  8.4 - 10.5 mg/dL Final  . GFR 02/06/2018 112.93  >60.00 mL/min Final  . Hgb A1c MFr Bld 02/06/2018 8.1* 4.6 - 6.5 % Final   Glycemic Control Guidelines for People with Diabetes:Non Diabetic:  <6%Goal of Therapy: <7%Additional Action Suggested:  >8%   . Cholesterol 02/06/2018 154  0 - 200 mg/dL Final   ATP III Classification       Desirable:  < 200 mg/dL  Borderline High:  200 - 239 mg/dL          High:  > = 240 mg/dL  . Triglycerides 02/06/2018 236.0* 0.0 - 149.0 mg/dL Final   Normal:  <150 mg/dLBorderline High:  150 - 199 mg/dL  . HDL 02/06/2018 47.90  >39.00 mg/dL Final  . VLDL 02/06/2018 47.2* 0.0 - 40.0 mg/dL Final  . Total CHOL/HDL Ratio 02/06/2018 3   Final                  Men          Women1/2 Average Risk     3.4          3.3Average Risk          5.0          4.42X Average Risk          9.6          7.13X Average Risk          15.0          11.0                      . NonHDL 02/06/2018 105.67   Final   NOTE:  Non-HDL goal should be 30 mg/dL higher than patient's LDL goal (i.e. LDL goal of < 70 mg/dL, would have non-HDL goal of < 100 mg/dL)  . Direct LDL 02/06/2018 80.0  mg/dL Final   Optimal:  <100 mg/dLNear or Above Optimal:  100-129 mg/dLBorderline High:  130-159 mg/dLHigh:  160-189 mg/dLVery High:  >190 mg/dL    Physical Examination:  BP 140/80 (BP Location: Left Arm, Patient Position: Sitting, Cuff Size: Normal)    Pulse 73   Ht 5\' 8"  (1.727 m)   Wt 184 lb 6.4 oz (83.6 kg)   SpO2 97%   BMI 28.04 kg/m   ASSESSMENT:  Diabetes type 2, BMI 28  See history of present illness for detailed discussion of current diabetes management, blood sugar patterns and problems identified   Her A1c is progressively higher at 8.1  Has been consistent with her medications including Xigduo but not clear why her blood sugars are higher with the A1c Her fasting readings are about the same as before it highest reading 167 She likely has high readings after meals which she does not monitor Also not consistently exercising She is tolerating her current medications fairly well Although she may benefit from higher dose of glipizide again not clear if she has low normal readings at lunch or dinner which she does not monitor  HYPERTENSION: Controlled  Lipids: Triglycerides are higher although LDL well controlled  PLAN:    Since she may have high postprandial readings she will benefit better from increasing her Trulicity up to 1.5 mg weekly  Since she has a large supply of the 0.75 doses she can take 2 together  She will let us know if she has any difficulties with this  Reminded her to check readings after meals and discussed blood sugar targets after meals  Follow-up in 3 months Regular exercise Follow-up with dietitian to help her with meal planning  She will discuss lipid management with her PCP who is prescribing currently She may do better with fenofibrate again since she cannot afford Lovaza  Form for handicapped permit filled out because of her hip pain  There are no Patient Instructions on file for this visit.   .  Counseling time on subjects discussed in assessment and  plan sections is over 50% of today's 25 minute visit   Elayne Snare 02/14/2018, 8:34 AM   Note: This office note was prepared with Dragon voice recognition system technology. Any transcriptional errors that result from this process  are unintentional.

## 2018-02-14 ENCOUNTER — Ambulatory Visit: Payer: Medicare Other | Admitting: Endocrinology

## 2018-02-14 ENCOUNTER — Encounter: Payer: Self-pay | Admitting: Endocrinology

## 2018-02-14 VITALS — BP 140/80 | HR 73 | Ht 68.0 in | Wt 184.4 lb

## 2018-02-14 DIAGNOSIS — E1165 Type 2 diabetes mellitus with hyperglycemia: Secondary | ICD-10-CM

## 2018-02-14 DIAGNOSIS — E782 Mixed hyperlipidemia: Secondary | ICD-10-CM | POA: Diagnosis not present

## 2018-02-14 NOTE — Telephone Encounter (Signed)
Pt is aware.  

## 2018-02-14 NOTE — Patient Instructions (Addendum)
Take 2 shots of Trulicity together   Must check sugars after meals:  Check blood sugars on waking up 2-3 days a week  Also check blood sugars about 2 hours after meals and do this after different meals by rotation  Recommended blood sugar levels on waking up are 90-130 and about 2 hours after meal is 130-160  Please bring your blood sugar monitor to each visit, thank you  Check on Fenofibrate

## 2018-02-15 ENCOUNTER — Telehealth: Payer: Self-pay | Admitting: Nurse Practitioner

## 2018-02-15 NOTE — Telephone Encounter (Signed)
See endocrinology note

## 2018-02-18 NOTE — Telephone Encounter (Signed)
Pt stated she will try lovaza and actovastin for right now since they got it from [facility]. Will consider this med later on.

## 2018-02-21 ENCOUNTER — Telehealth: Payer: Self-pay | Admitting: Endocrinology

## 2018-02-21 NOTE — Telephone Encounter (Signed)
Pt needs a new RX for Trulicity and Xigduo for dates 03/13/18 through 03/13/19 in order to continue in the [place].

## 2018-02-21 NOTE — Telephone Encounter (Signed)
Patient is calling because she received some mail from [place] regarding her prescriptions.  She has some questions regarding the information she received.  Is requesting a call back to [phone]

## 2018-02-22 ENCOUNTER — Other Ambulatory Visit: Payer: Self-pay

## 2018-02-22 MED ORDER — DAPAGLIFLOZIN PRO-METFORMIN ER 5-1000 MG PO TB24: 1.0000 | ORAL_TABLET | Freq: Two times a day (BID) | ORAL | 11 refills | Status: DC

## 2018-02-22 MED ORDER — DULAGLUTIDE 0.75 MG/0.5ML ~~LOC~~ SOAJ: 0.7500 mg | SUBCUTANEOUS | 4 refills | Status: DC

## 2018-03-11 ENCOUNTER — Encounter: Payer: Self-pay | Admitting: Dietician

## 2018-03-11 ENCOUNTER — Encounter: Payer: Medicare Other | Attending: Endocrinology | Admitting: Dietician

## 2018-03-11 DIAGNOSIS — E119 Type 2 diabetes mellitus without complications: Secondary | ICD-10-CM | POA: Diagnosis present

## 2018-03-11 DIAGNOSIS — IMO0002 Reserved for concepts with insufficient information to code with codable children: Secondary | ICD-10-CM

## 2018-03-11 DIAGNOSIS — E785 Hyperlipidemia, unspecified: Secondary | ICD-10-CM | POA: Insufficient documentation

## 2018-03-11 DIAGNOSIS — E1165 Type 2 diabetes mellitus with hyperglycemia: Secondary | ICD-10-CM

## 2018-03-11 DIAGNOSIS — E118 Type 2 diabetes mellitus with unspecified complications: Secondary | ICD-10-CM

## 2018-03-11 DIAGNOSIS — I10 Essential (primary) hypertension: Secondary | ICD-10-CM | POA: Insufficient documentation

## 2018-03-11 DIAGNOSIS — Z713 Dietary counseling and surveillance: Secondary | ICD-10-CM | POA: Insufficient documentation

## 2018-03-11 NOTE — Progress Notes (Signed)
Diabetes Self-Management Education  Visit Type: First/Initial  Appt. Start Time: 1150 Appt. End Time: 1250  03/11/2018  Ms. Sandra Ray, identified by name and date of birth, is a 72 y.o. female with a diagnosis of Diabetes: Type 2.   ASSESSMENT  Patient is here today with her husband.  History includes HLD, HTN, 3 back surgeries.  Increased stress last summer and increased pain chronic. Patient feels both are affecting her blood sugar.  No recent steroids.  She only gets about 6-7 hours of sleep per night.  Medications include Trulicity, Xigduo, and glipizide Triglycerides 236 and A1C 8.1% 02/06/18  Weight hx: Highest weight 254 lbs about 2 years ago.  She has been more mindful about her portions and has now lost to 198 lbs.  Continued slow weight loss. 254 lbs was her highest adult weight 145 lbs was her lowest adult weight. Patient has lost 15 lbs since her last visit with myself 19 months ago.  Patient lives with her husband. They share shopping and cooking. They also board a Electronics engineer.  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   She is not getting any exercise due to increased hip pain and is to start PT soon.  Increased pain from arthritis and has had 3 back surgeries. I last saw her 07/2016.   Height 5' 8.5" (1.74 m), weight 183 lb (83 kg). Body mass index is 27.42 kg/m.  Diabetes Self-Management Education - 03/11/18 1102      Visit Information   Visit Type  First/Initial      Initial Visit   Diabetes Type  Type 2    Are you currently following a meal plan?  Yes    Are you taking your medications as prescribed?  Yes    Date Diagnosed  1996      Psychosocial Assessment   Patient Belief/Attitude about Diabetes  Motivated to manage diabetes    Self-care barriers  None    Self-management support  Doctor's office;Family    Other persons present  Patient;Spouse/SO    Patient Concerns  Nutrition/Meal planning    Special  Needs  None    Preferred Learning Style  No preference indicated    Learning Readiness  Ready    How often do you need to have someone help you when you read instructions, pamphlets, or other written materials from your doctor or pharmacy?  4 - Often    What is the last grade level you completed in school?  GED      Pre-Education Assessment   Patient understands the diabetes disease and treatment process.  Needs Review    Patient understands incorporating nutritional management into lifestyle.  Needs Review    Patient undertands incorporating physical activity into lifestyle.  Needs Review    Patient understands using medications safely.  Needs Review    Patient understands monitoring blood glucose, interpreting and using results  Needs Review    Patient understands prevention, detection, and treatment of acute complications.  Needs Review    Patient understands prevention, detection, and treatment of chronic complications.  Needs Review    Patient understands how to develop strategies to address psychosocial issues.  Needs Review    Patient understands how to develop strategies to promote health/change behavior.  Needs Review      Complications   Last HgB A1C per patient/outside source  8.1 %   01/2018   How often do you check your blood sugar?  1-2 times/day    Fasting Blood glucose range (mg/dL)  130-179   130's   Number of hypoglycemic episodes per month  0    Number of hyperglycemic episodes per week  0    Have you had a dilated eye exam in the past 12 months?  Yes    Have you had a dental exam in the past 12 months?  No    Are you checking your feet?  Yes    How many days per week are you checking your feet?  6      Dietary Intake   Breakfast  rye toast, PB, 1/3 banana, small amount of jam OR cereal and almond milk    Snack (morning)  none    Lunch  hot dog sometimes in a bun or with chili, chips, slaw OR hamburger, chips   1:30-2   Snack (afternoon)  occasional cookie or  fruit (clementine or 1/2 apple)    Dinner  1/2-1 banana sandwich OR chicken, salad, crackers   5-6   Snack (evening)  1/4 cup Premier protein shake     Beverage(s)  coffee with stevia, powdered creamer, water, Pepsi Zero once per week or less, sweet tea (1T sugar) occasionally      Exercise   Exercise Type  ADL's   increased hip pain     Patient Education   Previous Diabetes Education  Yes (please comment)   2018   Nutrition management   Carbohydrate counting;Meal options for control of blood glucose level and chronic complications.;Food label reading, portion sizes and measuring food.    Physical activity and exercise   Helped patient identify appropriate exercises in relation to his/her diabetes, diabetes complications and other health issue.    Medications  Reviewed patients medication for diabetes, action, purpose, timing of dose and side effects.    Monitoring  Purpose and frequency of SMBG.    Acute complications  Taught treatment of hypoglycemia - the 15 rule.    Psychosocial adjustment  Role of stress on diabetes      Individualized Goals (developed by patient)   Nutrition  General guidelines for healthy choices and portions discussed    Physical Activity  Exercise 3-5 times per week;15 minutes per day   as tolerated   Medications  take my medication as prescribed    Monitoring   test my blood glucose as discussed    Problem Solving  adequate sleep    Reducing Risk  do foot checks daily;increase portions of healthy fats;examine blood glucose patterns    Health Coping  discuss diabetes with (comment)   MD, RD, CDE     Post-Education Assessment   Patient understands the diabetes disease and treatment process.  Demonstrates understanding / competency    Patient understands incorporating nutritional management into lifestyle.  Demonstrates understanding / competency    Patient undertands incorporating physical activity into lifestyle.  Demonstrates understanding / competency     Patient understands using medications safely.  Demonstrates understanding / competency    Patient understands monitoring blood glucose, interpreting and using results  Demonstrates understanding / competency    Patient understands prevention, detection, and treatment of acute complications.  Demonstrates understanding / competency    Patient understands prevention, detection, and treatment of chronic complications.  Demonstrates understanding / competency    Patient understands how to develop strategies to address psychosocial issues.  Demonstrates understanding / competency    Patient understands how to develop strategies to promote health/change behavior.  Demonstrates understanding /  competency      Outcomes   Expected Outcomes  Demonstrated interest in learning. Expect positive outcomes    Future DMSE  Yearly    Program Status  Completed       Individualized Plan for Diabetes Self-Management Training:   Learning Objective:  Patient will have a greater understanding of diabetes self-management. Patient education plan is to attend individual and/or group sessions per assessed needs and concerns.   Plan:   Patient Instructions  Reconsider candy. Decrease stress Start with PT  Lean meat, small portion (deck of cards or palm of hand). Half your plate should be non starchy vegetables. Vegetable plate with beans or boiled egg is fine  Aim for 30-45 grams per meal (2-3 carb choices)  Check your blood sugar as discussed.   Expected Outcomes:  Demonstrated interest in learning. Expect positive outcomes  Education material provided: ADA Diabetes: Your Take Control Guide, Meal plan card and Snack sheet  If problems or questions, patient to contact team via:  Phone  Future DSME appointment: Yearly

## 2018-03-11 NOTE — Patient Instructions (Signed)
Reconsider candy. Decrease stress Start with PT  Lean meat, small portion (deck of cards or palm of hand). Half your plate should be non starchy vegetables. Vegetable plate with beans or boiled egg is fine  Aim for 30-45 grams per meal (2-3 carb choices)  Check your blood sugar as discussed.

## 2018-03-18 ENCOUNTER — Other Ambulatory Visit: Payer: Self-pay

## 2018-03-18 ENCOUNTER — Telehealth: Payer: Self-pay | Admitting: Endocrinology

## 2018-03-18 DIAGNOSIS — E1165 Type 2 diabetes mellitus with hyperglycemia: Principal | ICD-10-CM

## 2018-03-18 DIAGNOSIS — E1151 Type 2 diabetes mellitus with diabetic peripheral angiopathy without gangrene: Secondary | ICD-10-CM

## 2018-03-18 DIAGNOSIS — IMO0002 Reserved for concepts with insufficient information to code with codable children: Secondary | ICD-10-CM

## 2018-03-18 MED ORDER — GLUCOSE BLOOD VI STRP: ORAL_STRIP | 5 refills | Status: DC

## 2018-03-18 NOTE — Telephone Encounter (Signed)
RX sent

## 2018-03-18 NOTE — Telephone Encounter (Signed)
glucose blood (ONE TOUCH ULTRA TEST) test strip  Patient is needing a prescription sent into the pharmacy, She stated she went to test this morning and her strips she has are expired.      [place] (SE), [place] - [place]

## 2018-03-19 ENCOUNTER — Other Ambulatory Visit: Payer: Self-pay

## 2018-03-19 ENCOUNTER — Telehealth: Payer: Self-pay | Admitting: Endocrinology

## 2018-03-19 MED ORDER — DAPAGLIFLOZIN PRO-METFORMIN ER 5-1000 MG PO TB24: 1.0000 | ORAL_TABLET | Freq: Two times a day (BID) | ORAL | 4 refills | Status: DC

## 2018-03-19 NOTE — Telephone Encounter (Signed)
Patient is out of glucose blood (ONE TOUCH ULTRA TEST) test strip. RX was sent to [facility] yesterday but Pharmacy told patient that RX was incorrect and they sent it back to Dr. Dwyane Dee to get corrected and have not received corrected RX yet. Patient needs the above RX asap. PHARM is [facility] on [address].

## 2018-03-19 NOTE — Telephone Encounter (Signed)
This has already been done and is awaiting MD signature.

## 2018-03-20 ENCOUNTER — Telehealth: Payer: Self-pay | Admitting: Endocrinology

## 2018-03-20 NOTE — Telephone Encounter (Signed)
Patient would like to check the status of the test strips awaiting Dr Ronnie Derby signature  Please advise

## 2018-03-20 NOTE — Telephone Encounter (Signed)
Called pt and left detailed voicemail explaining that her test strips were sent to [facility] on [address] on [address].

## 2018-03-21 ENCOUNTER — Other Ambulatory Visit: Payer: Self-pay | Admitting: Nurse Practitioner

## 2018-03-21 ENCOUNTER — Other Ambulatory Visit: Payer: Self-pay | Admitting: Endocrinology

## 2018-03-21 ENCOUNTER — Other Ambulatory Visit: Payer: Self-pay

## 2018-03-21 DIAGNOSIS — IMO0002 Reserved for concepts with insufficient information to code with codable children: Secondary | ICD-10-CM

## 2018-03-21 DIAGNOSIS — E1151 Type 2 diabetes mellitus with diabetic peripheral angiopathy without gangrene: Secondary | ICD-10-CM

## 2018-03-21 DIAGNOSIS — I10 Essential (primary) hypertension: Secondary | ICD-10-CM

## 2018-03-21 DIAGNOSIS — E1165 Type 2 diabetes mellitus with hyperglycemia: Principal | ICD-10-CM

## 2018-03-21 MED ORDER — GLUCOSE BLOOD VI STRP: ORAL_STRIP | 5 refills | Status: DC

## 2018-03-22 ENCOUNTER — Telehealth: Payer: Self-pay | Admitting: Endocrinology

## 2018-03-22 NOTE — Telephone Encounter (Signed)
Paperwork given to MD

## 2018-03-22 NOTE — Telephone Encounter (Signed)
Patient dropped off paperwork for Dr Dwyane Dee to sign. This has been placed in the front basket.

## 2018-03-26 ENCOUNTER — Other Ambulatory Visit: Payer: Self-pay | Admitting: Nurse Practitioner

## 2018-03-26 ENCOUNTER — Telehealth: Payer: Self-pay | Admitting: Endocrinology

## 2018-03-26 DIAGNOSIS — E876 Hypokalemia: Secondary | ICD-10-CM

## 2018-03-26 NOTE — Telephone Encounter (Signed)
glipiZIDE (GLUCOTROL XL) 2.5 MG 24 hr tablet   Patient is needing a prescription sent into her local pharmacy. She stated the [facility] said they did not received this prescription and the patient is completely out   [place] (SE), [place] - [place]

## 2018-03-27 ENCOUNTER — Telehealth: Payer: Self-pay | Admitting: *Deleted

## 2018-03-27 ENCOUNTER — Other Ambulatory Visit: Payer: Self-pay

## 2018-03-27 ENCOUNTER — Telehealth: Payer: Self-pay | Admitting: Endocrinology

## 2018-03-27 MED ORDER — GLIPIZIDE ER 2.5 MG PO TB24: 2.5000 mg | ORAL_TABLET | Freq: Every day | ORAL | 4 refills | Status: DC

## 2018-03-27 MED ORDER — GLIPIZIDE ER 2.5 MG PO TB24: 2.5000 mg | ORAL_TABLET | Freq: Every day | ORAL | 0 refills | Status: DC

## 2018-03-27 NOTE — Telephone Encounter (Signed)
Called and spoke with the patient, scheduled her for a new patient appt for 1/23 at 12pm. Patient given information  About free valet and pelvic exam

## 2018-03-27 NOTE — Telephone Encounter (Signed)
Paperwork signed and faxed to [facility]

## 2018-03-27 NOTE — Telephone Encounter (Signed)
30 supply sent to local pharmacy and then 90 day supply sent to [facility].

## 2018-03-27 NOTE — Telephone Encounter (Signed)
Patient would like to check the status of paperwork that was dropped off last week for Dr Dwyane Dee to sign.

## 2018-03-28 ENCOUNTER — Telehealth: Payer: Self-pay

## 2018-03-28 NOTE — Telephone Encounter (Signed)
Received shipment of Trulicity from [employer]. 5 boxes received.  Pt called and notified.

## 2018-04-03 ENCOUNTER — Encounter: Payer: Self-pay | Admitting: Nurse Practitioner

## 2018-04-03 ENCOUNTER — Ambulatory Visit (INDEPENDENT_AMBULATORY_CARE_PROVIDER_SITE_OTHER): Payer: Medicare Other | Admitting: Nurse Practitioner

## 2018-04-03 VITALS — BP 138/74 | HR 73 | Temp 97.5°F | Ht 68.5 in | Wt 181.0 lb

## 2018-04-03 DIAGNOSIS — E785 Hyperlipidemia, unspecified: Secondary | ICD-10-CM

## 2018-04-03 DIAGNOSIS — E1151 Type 2 diabetes mellitus with diabetic peripheral angiopathy without gangrene: Secondary | ICD-10-CM

## 2018-04-03 DIAGNOSIS — E876 Hypokalemia: Secondary | ICD-10-CM

## 2018-04-03 DIAGNOSIS — I10 Essential (primary) hypertension: Secondary | ICD-10-CM

## 2018-04-03 LAB — HEPATIC FUNCTION PANEL
ALT: 32 U/L (ref 0–35)
AST: 37 U/L (ref 0–37)
Albumin: 4.8 g/dL (ref 3.5–5.2)
Alkaline Phosphatase: 43 U/L (ref 39–117)
BILIRUBIN DIRECT: 0.1 mg/dL (ref 0.0–0.3)
Total Bilirubin: 0.5 mg/dL (ref 0.2–1.2)
Total Protein: 7.3 g/dL (ref 6.0–8.3)

## 2018-04-03 LAB — LIPID PANEL
Cholesterol: 123 mg/dL (ref 0–200)
HDL: 43.4 mg/dL (ref >39.00)
LDL Cholesterol: 47 mg/dL (ref 0–99)
NonHDL: 79.84
Total CHOL/HDL Ratio: 3
Triglycerides: 164 mg/dL — ABNORMAL HIGH (ref 0.0–149.0)
VLDL: 32.8 mg/dL (ref 0.0–40.0)

## 2018-04-03 NOTE — Progress Notes (Signed)
Subjective:  Patient ID: Sandra Ray, female    DOB: 08-28-1945  Age: 73 y.o. MRN: [id number]  CC: Follow-up (6 mo fu/fasting. )  HPI  DM: managed by Dr. Dwyane Dee. Last HgbA1c 8.1 done 01/2018. Up to date with ophthalmology exam, foot exam and urine microalbumin.  HTN: Stable, BP at goal.  Hyperlipidemia: need to repeat lipid panel.  Left hip pain: Ongoing evaluation by orthopedic and GYN.  Reviewed past Medical, Social and Family history today.  Outpatient Medications Prior to Visit  Medication Sig Dispense Refill  . Acetaminophen (TYLENOL 8 HOUR PO) Take 650 mg by mouth.     Marland Kitchen albuterol (VENTOLIN HFA) 108 (90 BASE) MCG/ACT inhaler Inhale 2 puffs into the lungs every 6 (six) hours as needed.      Marland Kitchen aspirin 81 MG tablet Take 81 mg by mouth daily.      . Cholecalciferol (VITAMIN D-3) 5000 UNITS TABS Take by mouth daily.     . Coenzyme Q10 100 MG TABS Take 400 mg by mouth daily.    . cyanocobalamin 500 MCG tablet Take 500 mcg by mouth daily.    . Dapagliflozin-metFORMIN HCl ER (XIGDUO XR) 07-998 MG TB24 Take 1 tablet by mouth 2 (two) times daily. Take 1 tablet by mouth twice daily. 180 tablet 4  . Dulaglutide (TRULICITY) 5.73 UK/0.2RK SOPN Inject 0.75 mg into the skin once a week. 12 pen 4  . fluticasone (FLONASE) 50 MCG/ACT nasal spray Place 2 sprays into both nostrils daily. 16 g 0  . Garlic 2706 MG CAPS Take 1 capsule by mouth daily.     Marland Kitchen glipiZIDE (GLUCOTROL XL) 2.5 MG 24 hr tablet Take 1 tablet (2.5 mg total) by mouth daily with breakfast. 90 tablet 4  . glucose blood (ONE TOUCH ULTRA TEST) test strip Use as instructed once a day.  DX code E11.40 100 each 5  . Lancets (ONETOUCH ULTRASOFT) lancets Use as instructed once day.  E11.40 100 each 5  . lisinopril (PRINIVIL,ZESTRIL) 5 MG tablet Take 1 tablet (5 mg total) by mouth daily. 90 tablet 3  . ondansetron (ZOFRAN) 4 MG tablet Take 1 tablet (4 mg total) by mouth as needed for nausea or vomiting. 20 tablet 0  . atorvastatin  (LIPITOR) 40 MG tablet Take 1 tablet (40 mg total) by mouth daily at 6 PM. 90 tablet 1  . gabapentin (NEURONTIN) 300 MG capsule Take 1 capsule (300 mg total) by mouth 3 (three) times daily. 270 capsule 2  . hydrochlorothiazide (HYDRODIURIL) 25 MG tablet TAKE 1 TABLET BY MOUTH  DAILY 90 tablet 0  . omega-3 acid ethyl esters (LOVAZA) 1 g capsule Take 1 capsule (1 g total) by mouth 2 (two) times daily. 60 capsule 5  . potassium chloride SA (K-DUR,KLOR-CON) 20 MEQ tablet Take 1 tablet (20 mEq total) by mouth daily. 90 tablet 1   No facility-administered medications prior to visit.     ROS See HPI  Objective:  BP 138/74   Pulse 73   Temp (!) 97.5 F (36.4 C) (Oral)   Ht 5' 8.5" (1.74 m)   Wt 181 lb (82.1 kg)   SpO2 98%   BMI 27.12 kg/m   BP Readings from Last 3 Encounters:  04/03/18 138/74  02/14/18 140/80  12/14/17 140/70    Wt Readings from Last 3 Encounters:  04/03/18 181 lb (82.1 kg)  03/11/18 183 lb (83 kg)  02/14/18 184 lb 6.4 oz (83.6 kg)    Physical Exam Vitals signs reviewed.  Cardiovascular:     Rate and Rhythm: Normal rate and regular rhythm.     Pulses: Normal pulses.     Heart sounds: Normal heart sounds.  Pulmonary:     Effort: Pulmonary effort is normal.     Breath sounds: Normal breath sounds.  Musculoskeletal:     Right lower leg: No edema.     Left lower leg: No edema.  Neurological:     Mental Status: She is alert and oriented to person, place, and time.    Lab Results  Component Value Date   WBC 8.5 05/18/2015   HGB 14.2 05/18/2015   HCT 42.3 05/18/2015   PLT 294.0 05/18/2015   GLUCOSE 136 (H) 02/06/2018   CHOL 123 04/03/2018   TRIG 164.0 (H) 04/03/2018   HDL 43.40 04/03/2018   LDLDIRECT 80.0 02/06/2018   LDLCALC 47 04/03/2018   ALT 32 04/03/2018   AST 37 04/03/2018   NA 137 02/06/2018   K 4.0 02/06/2018   CL 98 02/06/2018   CREATININE 0.56 02/06/2018   BUN 16 02/06/2018   CO2 28 02/06/2018   TSH 0.59 10/22/2014   INR 1.19  10/28/2009   HGBA1C 8.1 (H) 02/06/2018   MICROALBUR 0.9 08/24/2017    Mm 3d Screen Breast Bilateral  Result Date: 02/12/2018 CLINICAL DATA:  Screening. EXAM: DIGITAL SCREENING BILATERAL MAMMOGRAM WITH TOMO AND CAD COMPARISON:  Previous exam(s). ACR Breast Density Category b: There are scattered areas of fibroglandular density. FINDINGS: There are no findings suspicious for malignancy. Images were processed with CAD. IMPRESSION: No mammographic evidence of malignancy. A result letter of this screening mammogram will be mailed directly to the patient. RECOMMENDATION: Screening mammogram in one year. (Code:SM-B-01Y) BI-RADS CATEGORY  1: Negative. Electronically Signed   By: Claudie Revering M.D.   On: 02/12/2018 17:10    Assessment & Plan:   Sandra Ray was seen today for follow-up.  Diagnoses and all orders for this visit:  Essential hypertension  Hyperlipidemia LDL goal <70 -     Lipid panel -     Hepatic function panel -     atorvastatin (LIPITOR) 40 MG tablet; Take 1 tablet (40 mg total) by mouth daily at 6 PM. -     omega-3 acid ethyl esters (LOVAZA) 1 g capsule; Take 1 capsule (1 g total) by mouth 2 (two) times daily.  Hypokalemia -     potassium chloride SA (K-DUR,KLOR-CON) 20 MEQ tablet; Take 1 tablet (20 mEq total) by mouth daily.  Essential hypertension, benign -     hydrochlorothiazide (HYDRODIURIL) 25 MG tablet; Take 1 tablet (25 mg total) by mouth daily.  Type II diabetes mellitus with peripheral artery disease (HCC) -     gabapentin (NEURONTIN) 300 MG capsule; Take 1 capsule (300 mg total) by mouth 3 (three) times daily.   I have changed [place] hydrochlorothiazide. I am also having her maintain her aspirin, albuterol, Vitamin D-3, Garlic, Coenzyme W58, onetouch ultrasoft, vitamin B-12, ondansetron, fluticasone, lisinopril, Acetaminophen (TYLENOL 8 HOUR PO), Dulaglutide, Dapagliflozin-metFORMIN HCl ER, glucose blood, glipiZIDE, atorvastatin, omega-3 acid ethyl esters,  potassium chloride SA, and gabapentin.  Meds ordered this encounter  Medications  . atorvastatin (LIPITOR) 40 MG tablet    Sig: Take 1 tablet (40 mg total) by mouth daily at 6 PM.    Dispense:  90 tablet    Refill:  1    Order Specific Question:   Supervising Provider    Answer:   Lucille Passy [[id number]]  . omega-3 acid  ethyl esters (LOVAZA) 1 g capsule    Sig: Take 1 capsule (1 g total) by mouth 2 (two) times daily.    Dispense:  60 capsule    Refill:  5    Order Specific Question:   Supervising Provider    Answer:   Lucille Passy [[id number]]  . potassium chloride SA (K-DUR,KLOR-CON) 20 MEQ tablet    Sig: Take 1 tablet (20 mEq total) by mouth daily.    Dispense:  90 tablet    Refill:  1    Order Specific Question:   Supervising Provider    Answer:   Lucille Passy [[id number]]  . hydrochlorothiazide (HYDRODIURIL) 25 MG tablet    Sig: Take 1 tablet (25 mg total) by mouth daily.    Dispense:  90 tablet    Refill:  1    Order Specific Question:   Supervising Provider    Answer:   Lucille Passy [[id number]]  . gabapentin (NEURONTIN) 300 MG capsule    Sig: Take 1 capsule (300 mg total) by mouth 3 (three) times daily.    Dispense:  270 capsule    Refill:  3    Order Specific Question:   Supervising Provider    Answer:   Lucille Passy [[id number]]    Problem List Items Addressed This Visit      Cardiovascular and Mediastinum   Essential hypertension - Primary   Relevant Medications   atorvastatin (LIPITOR) 40 MG tablet   omega-3 acid ethyl esters (LOVAZA) 1 g capsule   hydrochlorothiazide (HYDRODIURIL) 25 MG tablet     Other   Hyperlipidemia LDL goal <70   Relevant Medications   atorvastatin (LIPITOR) 40 MG tablet   omega-3 acid ethyl esters (LOVAZA) 1 g capsule   hydrochlorothiazide (HYDRODIURIL) 25 MG tablet   Other Relevant Orders   Lipid panel (Completed)   Hepatic function panel (Completed)    Other Visit Diagnoses    Hypokalemia       Relevant Medications   potassium chloride SA  (K-DUR,KLOR-CON) 20 MEQ tablet   Essential hypertension, benign       Relevant Medications   atorvastatin (LIPITOR) 40 MG tablet   omega-3 acid ethyl esters (LOVAZA) 1 g capsule   hydrochlorothiazide (HYDRODIURIL) 25 MG tablet   Type II diabetes mellitus with peripheral artery disease (HCC)       Relevant Medications   atorvastatin (LIPITOR) 40 MG tablet   omega-3 acid ethyl esters (LOVAZA) 1 g capsule   hydrochlorothiazide (HYDRODIURIL) 25 MG tablet   gabapentin (NEURONTIN) 300 MG capsule       Follow-up: Return in about 6 months (around 10/02/2018) for HTN and , hyperlipidemia (fasting) and AWV with Angel.  Wilfred Lacy, NP

## 2018-04-03 NOTE — Patient Instructions (Addendum)
Normal liver function. Improved lipid panel with decreased triglyceride and LDL. Continue omega 3 and lipitor. F/up in 65months (fasting)  Need to schedule Annual Wellness Visit with Wellness coach

## 2018-04-04 ENCOUNTER — Encounter: Payer: Self-pay | Admitting: Obstetrics

## 2018-04-04 ENCOUNTER — Encounter: Payer: Self-pay | Admitting: Nurse Practitioner

## 2018-04-04 ENCOUNTER — Inpatient Hospital Stay: Payer: Medicare Other | Attending: Obstetrics | Admitting: Obstetrics

## 2018-04-04 VITALS — BP 121/73 | HR 71 | Temp 97.7°F | Resp 18 | Ht 68.5 in | Wt 181.0 lb

## 2018-04-04 DIAGNOSIS — N83202 Unspecified ovarian cyst, left side: Secondary | ICD-10-CM | POA: Diagnosis not present

## 2018-04-04 DIAGNOSIS — N83201 Unspecified ovarian cyst, right side: Secondary | ICD-10-CM | POA: Diagnosis not present

## 2018-04-04 DIAGNOSIS — N83209 Unspecified ovarian cyst, unspecified side: Secondary | ICD-10-CM

## 2018-04-04 MED ORDER — GABAPENTIN 300 MG PO CAPS: 300.0000 mg | ORAL_CAPSULE | Freq: Three times a day (TID) | ORAL | 3 refills | Status: DC

## 2018-04-04 MED ORDER — POTASSIUM CHLORIDE CRYS ER 20 MEQ PO TBCR: 20.0000 meq | EXTENDED_RELEASE_TABLET | Freq: Every day | ORAL | 1 refills | Status: DC

## 2018-04-04 MED ORDER — ATORVASTATIN CALCIUM 40 MG PO TABS: 40.0000 mg | ORAL_TABLET | Freq: Every day | ORAL | 1 refills | Status: DC

## 2018-04-04 MED ORDER — HYDROCHLOROTHIAZIDE 25 MG PO TABS: 25.0000 mg | ORAL_TABLET | Freq: Every day | ORAL | 1 refills | Status: DC

## 2018-04-04 MED ORDER — OMEGA-3-ACID ETHYL ESTERS 1 G PO CAPS: 1.0000 g | ORAL_CAPSULE | Freq: Two times a day (BID) | ORAL | 5 refills | Status: DC

## 2018-04-04 NOTE — Patient Instructions (Addendum)
Preparing for your Surgery  Plan for sur  Surgery on April 16, 2018 with Dr. Precious Haws at [place] will be scheduled for a Robotic Assisted Bilateral Salpingo-oophorectomy with possible staging at [facility].   Pre-operative [place] will receive a phone call from presurgical testing at [facility] to arrange for a pre-operative testing appointment before your surgery.  This appointment normally occurs one to two weeks before your scheduled surgery.   -Bring your insurance card, copy of an advanced directive if applicable, medication list  -At that visit, you will be asked to sign a consent for a possible blood transfusion in case a transfusion becomes necessary during surgery.  The need for a blood transfusion is rare but having consent is a necessary part of your care.     -You should not be taking blood thinners or aspirin at least ten days prior to surgery unless instructed by your surgeon.  Day Before Surgery at [place] will be asked to take in a light diet the day before surgery.  Avoid carbonated beverages.  You will be advised to have nothing to eat or drink after midnight the evening before.    Eat a light diet the day before surgery.  Examples including soups, broths, toast, yogurt, mashed potatoes.  Things to avoid include carbonated beverages (fizzy beverages), raw fruits and raw vegetables, or beans.   If your bowels are filled with gas, your surgeon will have difficulty visualizing your pelvic organs which increases your surgical risks.  Your role in recovery Your role is to become active as soon as directed by your doctor, while still giving yourself time to heal.  Rest when you feel tired. You will be asked to do the following in order to speed your recovery:  - Cough and breathe deeply. This helps toclear and expand your lungs and can prevent pneumonia. You may be given a spirometer to practice deep breathing. A  staff member will show you how to use the spirometer. - Do mild physical activity. Walking or moving your legs help your circulation and body functions return to normal. A staff member will help you when you try to walk and will provide you with simple exercises. Do not try to get up or walk alone the first time. - Actively manage your pain. Managing your pain lets you move in comfort. We will ask you to rate your pain on a scale of zero to 10. It is your responsibility to tell your doctor or nurse where and how much you hurt so your pain can be treated.  Special Considerations -If you are diabetic, you may be placed on insulin after surgery to have closer control over your blood sugars to promote healing and recovery.  This does not mean that you will be discharged on insulin.  If applicable, your oral antidiabetics will be resumed when you are tolerating a solid diet.  -Your final pathology results from surgery should be available around one week after surgery and the results will be relayed to you when available.  -Hadley Pen is the Surgeon that assists your GYN Oncologist with surgery.  The next day after your surgery you will either see your GYN Oncologist, Dr. Precious Haws, or Dr. Lahoma Crocker.  -FMLA forms can be faxed to [phone] and please allow 5-7 business days for completion.   Blood Transfusion Information WHAT IS A BLOOD TRANSFUSION? A transfusion is the replacement of blood or some of its parts. Blood  is made up of multiple cells which provide different functions.  Red blood cells carry oxygen and are used for blood loss replacement.  White blood cells fight against infection.  Platelets control bleeding.  Plasma helps clot blood.  Other blood products are available for specialized needs, such as hemophilia or other clotting disorders. BEFORE THE TRANSFUSION  Who gives blood for transfusions?   You may be able to donate blood to be used at a later  date on yourself (autologous donation).  Relatives can be asked to donate blood. This is generally not any safer than if you have received blood from a stranger. The same precautions are taken to ensure safety when a relative's blood is donated.  Healthy volunteers who are fully evaluated to make sure their blood is safe. This is blood bank blood. Transfusion therapy is the safest it has ever been in the practice of medicine. Before blood is taken from a donor, a complete history is taken to make sure that person has no history of diseases nor engages in risky social behavior (examples are intravenous drug use or sexual activity with multiple partners). The donor's travel history is screened to minimize risk of transmitting infections, such as malaria. The donated blood is tested for signs of infectious diseases, such as HIV and hepatitis. The blood is then tested to be sure it is compatible with you in order to minimize the chance of a transfusion reaction. If you or a relative donates blood, this is often done in anticipation of surgery and is not appropriate for emergency situations. It takes many days to process the donated blood. RISKS AND COMPLICATIONS Although transfusion therapy is very safe and saves many lives, the main dangers of transfusion include:   Getting an infectious disease.  Developing a transfusion reaction. This is an allergic reaction to something in the blood you were given. Every precaution is taken to prevent this. The decision to have a blood transfusion has been considered carefully by your caregiver before blood is given. Blood is not given unless the benefits outweigh the risks.

## 2018-04-04 NOTE — H&P (View-Only) (Signed)
[place] at [facility] Note: New Patient First Visit   Consult was requested by Dr. Brien Few for an adnexal mass   Chief Complaint  Patient presents with  . Cyst of ovary, unspecified laterality    GYN Oncologic Summary 1. TBD o .  HPI: Ms. Sandra Ray  is a very nice 73 y.o.  P2  She noted left hip pain about 1.5 years ago. She has a known h/o arthritis and degenerative disc disease.Per the patient's report her orthopedic provider obtained a pelvic MRI and she was found to have an adnexal mass.  She was sent on to GYN. At the time Dr. Hulan Fray assessed her and TVUS was performed 08/2017 Left ovary Measurements: 4.5 x 2.5 x 3.5 centimeters. Within the LEFT ovary there is a mixed echogenicity lesion, with anechoic spaces measuring 3.5 x 2.0 x 3.1 cm. Pelvic MRI 09/2017 showed the ovary measuring 3.1x4.6x3.2cm and a dominant cyst 2.1cm on long axis.  She then saw Dr. Ronita Hipps for a second opinion. 11/2017. TVUS was done (results not available). CA125 noted to be "7". Followup ultrasound 03/26/18 showed the mass to be complex measuring 5.7x3.1cm but notes internal blood flow to the soft tissue areas. No free fluid. Repeat CA125 = 6 on 03/26/18  Given the persistence of the mass and its complexity she was referred for management / recommendations  She denies N/V, denies change in appetite, has lost some weight states on purpose (>25 pounds), some diarrhea due to medication but lately a little constipation.No early satiety  Imported EPIC Oncologic History:   No history exists.    Measurement of disease: Ca125 . 03/26/18 = 6  Radiology: . As noted in HPI . 08/31/17 -- ULTRASOUND PELVIS TRANSVAGINAL TECHNIQUE: Transvaginal ultrasound examination of the pelvis was performed including evaluation of the uterus, ovaries, adnexal regions, and pelvic cul-de-sac.  COMPARISON:  Lumbar spine MRI 06/18/2009; more recent comparison is not available.     FINDINGS:  Uterus   Measurements: Surgically absent. Vaginal cuff is normal in appearance.  Endometrium    Thickness: Surgically absent uterus.  Right ovary  Measurements: The ovary is not visualized, either absent or obscured. No adnexal mass identified on the RIGHT.  Left ovary  Measurements: 4.5 x 2.5 x 3.5 centimeters. Within the LEFT ovary there is a mixed echogenicity lesion, with anechoic spaces measuring 3.5 x 2.0 x 3.1 centimeters. There is a thin septation traversing the lesion which contains calcifications. Suspect calcifications in the periphery of this lesion.  Other findings:  No abnormal free fluid  IMPRESSION: 1. Hysterectomy. 2. Indeterminate LEFT ovarian lesion, containing calcifications within a thick septum and also peripheral calcifications. Lesion measures up to 3.5 centimeters. Consider MRI and possible surgical evaluation. Otherwise, pelvic ultrasound would be suggested in 8-12 weeks. . 09/2017 - pelvic MRI FINDINGS: Urinary Tract: Bladder unremarkable. No evidence for urethral diverticulum.  Bowel:  No small bowel or colonic dilatation within the pelvis.Vascular/Lymphatic: No pathologicallyenlarged lymph nodes. Nosignificant vascular abnormality seen.Reproductive:  Uterus surgically absent. Right ovary not discretely visualized.Left ovary measures 3.1 x 4.6 x 3.2 cm. Multiple cysticareas ofvarying size and signal intensity are identified within theparenchyma. Dominant cystic component measures 2.1 cm and long axison today's study. Areas of parenchymal enhancement are seen withinthe parenchyma.ther:  No intraperitoneal free fluid.Musculoskeletal: Lumbosacral fusion.IMPRESSION:1. Left ovary contains multiple small foci of varying size andsignal intensity. Largest focus measures 2.1cm today. These cannotbe definitively characterized but no overtly suspicious appearanceof the left  ovary on today's study. Follow-up MRI in 3 months couldbe used to ensure stability.2.  Nonvisualization rightovarywithout right adnexal mass.3. Uterus surgically absent.  Outpatient Encounter Medications as of 04/04/2018  Medication Sig  . Acetaminophen (TYLENOL 8 HOUR PO) Take 650 mg by mouth.   Marland Kitchen albuterol (VENTOLIN HFA) 108 (90 BASE) MCG/ACT inhaler Inhale 2 puffs into the lungs every 6 (six) hours as needed.    Marland Kitchen aspirin 81 MG tablet Take 81 mg by mouth daily.    Marland Kitchen atorvastatin (LIPITOR) 40 MG tablet Take 1 tablet (40 mg total) by mouth daily at 6 PM.  . Cholecalciferol (VITAMIN D-3) 5000 UNITS TABS Take by mouth daily.   . Coenzyme Q10 100 MG TABS Take 400 mg by mouth daily.  . cyanocobalamin 500 MCG tablet Take 500 mcg by mouth daily.  . Dapagliflozin-metFORMIN HCl ER (XIGDUO XR) 07-998 MG TB24 Take 1 tablet by mouth 2 (two) times daily. Take 1 tablet by mouth twice daily.  . Dulaglutide (TRULICITY) 9.37 TK/2.4OX SOPN Inject 0.75 mg into the skin once a week.  . fluticasone (FLONASE) 50 MCG/ACT nasal spray Place 2 sprays into both nostrils daily.  Marland Kitchen gabapentin (NEURONTIN) 300 MG capsule Take 1 capsule (300 mg total) by mouth 3 (three) times daily.  . Garlic 7353 MG CAPS Take 1 capsule by mouth daily.   Marland Kitchen glipiZIDE (GLUCOTROL XL) 2.5 MG 24 hr tablet Take 1 tablet (2.5 mg total) by mouth daily with breakfast.  . glucose blood (ONE TOUCH ULTRA TEST) test strip Use as instructed once a day.  DX code E11.40  . hydrochlorothiazide (HYDRODIURIL) 25 MG tablet Take 1 tablet (25 mg total) by mouth daily.  . Lancets (ONETOUCH ULTRASOFT) lancets Use as instructed once day.  E11.40  . lisinopril (PRINIVIL,ZESTRIL) 5 MG tablet Take 1 tablet (5 mg total) by mouth daily.  Marland Kitchen omega-3 acid ethyl esters (LOVAZA) 1 g capsule Take 1 capsule (1 g total) by mouth 2 (two) times daily.  . ondansetron (ZOFRAN) 4 MG tablet Take 1 tablet (4 mg total) by mouth as needed for nausea or vomiting.  . potassium chloride SA (K-DUR,KLOR-CON) 20 MEQ tablet Take 1 tablet (20 mEq total) by mouth daily.  .  [DISCONTINUED] atorvastatin (LIPITOR) 40 MG tablet Take 1 tablet (40 mg total) by mouth daily at 6 PM.  . [DISCONTINUED] gabapentin (NEURONTIN) 300 MG capsule Take 1 capsule (300 mg total) by mouth 3 (three) times daily.  . [DISCONTINUED] hydrochlorothiazide (HYDRODIURIL) 25 MG tablet TAKE 1 TABLET BY MOUTH  DAILY  . [DISCONTINUED] omega-3 acid ethyl esters (LOVAZA) 1 g capsule Take 1 capsule (1 g total) by mouth 2 (two) times daily.  . [DISCONTINUED] potassium chloride SA (K-DUR,KLOR-CON) 20 MEQ tablet Take 1 tablet (20 mEq total) by mouth daily.   No facility-administered encounter medications on file as of 04/04/2018.    Allergies  Allergen Reactions  . Penicillins     Causes yeast infections  . Amoxicillin Other (See Comments)    Pt states it causes her a yeast infection   . Erythromycin     Passed out while taking    Past Medical History:  Diagnosis Date  . Arthritis   . Breast abscess 2000   right breast  . Carpal tunnel syndrome   . Degenerative disc disease   . Diabetes mellitus type II   . Eczema of hand   . Hx of colonic polyps 12/02/2014   2004 - 2 diminutive polyps   . Hyperlipidemia   .  Hypertension   . Metatarsal deformity   . Ovarian cyst 07/2017   left   . Shingles 2000  . Tubular adenoma of colon    Past Surgical History:  Procedure Laterality Date  . ABDOMINAL HYSTERECTOMY  1981   menorrhagia  . ABSCESS DRAINAGE Right    breast  . APPENDECTOMY  1966   done with CSxn. Prophylactic  . BREAST BIOPSY Left   . carpel tunnel Right   . CESAREAN SECTION     x2  . COLONOSCOPY    . LUMBAR LAMINECTOMY     [id number]. 2011 L4,L5,S1  . Right rotator cuff repair 09/2015    . TUBAL LIGATION          Past Gynecological History:   GYNECOLOGIC HISTORY:  . No LMP recorded. Patient has had a hysterectomy. 73yo . Menarche: 73 years old . P 2 . Contraceptive h/o OCP . HRT none  . Last Pap NA Family Hx:  Family History  Problem Relation Age of Onset  .  Hypertension Father   . Diabetes Mother   . Stroke Mother   . Hypertension Mother   . High Cholesterol Mother   . Heart disease Mother   . Diabetes Brother   . Breast cancer Daughter 49       unsure if did genetic testing  . Breast cancer Maternal Aunt   . Breast cancer Sister 37  . Colon polyps Brother    Social Hx:  Marland Kitchen Tobacco use: none . Alcohol use: none . Illicit Drug use: none     Review of Systems: Review of Systems  Gastrointestinal: Positive for constipation and diarrhea.  Genitourinary: Positive for pelvic pain.   Musculoskeletal: Positive for arthralgias.  All other systems reviewed and are negative.   Vitals:  Vitals:   04/04/18 1141  BP: 121/73  Pulse: 71  Resp: 18  Temp: 97.7 F (36.5 C)  SpO2: 100%   Vitals:   04/04/18 1141  Weight: 181 lb (82.1 kg)  Height: 5' 8.5" (1.74 m)   Body mass index is 27.12 kg/m.  Physical Exam: General :  Well developed, 73 y.o., female in no apparent distress HEENT:  Normocephalic/atraumatic, symmetric, EOMI, eyelids normal Neck:   Supple, no masses.  Lymphatics:  No cervical/ submandibular/ supraclavicular/ infraclavicular/ inguinal adenopathy Respiratory:  Respirations unlabored, no use of accessory muscles CV:   Deferred Breast:  Deferred Musculoskeletal: No CVA tenderness, normal muscle strength. Abdomen:  Soft, non-tender and nondistended. No evidence of hernia. No masses. Extremities:  No lymphedema, no erythema, non-tender. Skin:   Normal inspection Neuro/Psych:  No focal motor deficit, no abnormal mental status. Normal gait. Normal affect. Alert and oriented to person, place, and time  Genito Urinary: Vulva: Normal external female genitalia.  Bladder/urethra: Urethral meatus normal in size and location. No lesions or   masses, well supported bladder Speculum exam: Vagina: No lesion, no discharge, no bleeding. Bimanual exam: Cervix/Uterus: Surgically absent Uterus: Normal size, mobile.  Adnexal region:  No masses. Rectovaginal:  Good tone, no masses, no cul de sac nodularity, no parametrial involvement or nodularity.   Assessment  Adnexal mass  Plan  Complexity of visit ? This is a new problem and additional workup will include a CEA level ? Data reviewed ? I independently reviewed the images and the radiology reports and discussed my interpretation in the presence of the patient and her husband today ? The mass is complex, irregular in shape and a portion of it has doppler flow ? I  reviewed her referring doctor's office notes and I have summarized in the HPI ? History was obtained from the patient and the chart ? We reviewed normal tumor markers ? This is an undiagnosed new problem with uncertain prognosis and management will include surgery with comorbidities of diagnosis of diabetes and previous hysterectomy increasing surgical risks. 1. Reassuring is the normal CA125, although I explained to her I don't like the complexity of the mass and the vascular flow in one portion of the mass ? There has not been a great change in size since the summer 2019 and hopefully that too is reassuirng 2. Surgical discussion ? We discussed proceeding with laparoscopic BSO, possible Exlap/staging. As usual we discussed simply USO should the pathology be benign and removal of the contralateral (normal) ovary is judged to be at high risk for injury with removal. ? I will check a CEA on preop labs ? She understands the concept and limitations of frozen section ? The surgical sketch was reviewed including the risks, benefits, and alternatives of surgery and she was given a copy for her records 3. I will have her return 2-3 weeks postop to review the pathology and treatment plan.  Face to face time with patient was 60 minutes. Over 50% of this time was spent on counseling and coordination of care.   Mart Piggs, MD Gynecologic Oncologist 04/04/2018, 1:19 PM    Cc: Brien Few, MD  (Referring Ob/Gyn) Nche, Charlene Brooke, NP  (PCP)

## 2018-04-04 NOTE — Progress Notes (Signed)
[place] at [facility] Note: New Patient First Visit   Consult was requested by Dr. Brien Few for an adnexal mass   Chief Complaint  Patient presents with  . Cyst of ovary, unspecified laterality    GYN Oncologic Summary 1. TBD o .  HPI: Ms. Sandra Ray  is a very nice 73 y.o.  P2  She noted left hip pain about 1.5 years ago. She has a known h/o arthritis and degenerative disc disease.Per the patient's report her orthopedic provider obtained a pelvic MRI and she was found to have an adnexal mass.  She was sent on to GYN. At the time Dr. Hulan Fray assessed her and TVUS was performed 08/2017 Left ovary Measurements: 4.5 x 2.5 x 3.5 centimeters. Within the LEFT ovary there is a mixed echogenicity lesion, with anechoic spaces measuring 3.5 x 2.0 x 3.1 cm. Pelvic MRI 09/2017 showed the ovary measuring 3.1x4.6x3.2cm and a dominant cyst 2.1cm on long axis.  She then saw Dr. Ronita Hipps for a second opinion. 11/2017. TVUS was done (results not available). CA125 noted to be "7". Followup ultrasound 03/26/18 showed the mass to be complex measuring 5.7x3.1cm but notes internal blood flow to the soft tissue areas. No free fluid. Repeat CA125 = 6 on 03/26/18  Given the persistence of the mass and its complexity she was referred for management / recommendations  She denies N/V, denies change in appetite, has lost some weight states on purpose (>25 pounds), some diarrhea due to medication but lately a little constipation.No early satiety  Imported EPIC Oncologic History:   No history exists.    Measurement of disease: Ca125 . 03/26/18 = 6  Radiology: . As noted in HPI . 08/31/17 -- ULTRASOUND PELVIS TRANSVAGINAL TECHNIQUE: Transvaginal ultrasound examination of the pelvis was performed including evaluation of the uterus, ovaries, adnexal regions, and pelvic cul-de-sac.  COMPARISON:  Lumbar spine MRI 06/18/2009; more recent comparison is not available.     FINDINGS:  Uterus   Measurements: Surgically absent. Vaginal cuff is normal in appearance.  Endometrium    Thickness: Surgically absent uterus.  Right ovary  Measurements: The ovary is not visualized, either absent or obscured. No adnexal mass identified on the RIGHT.  Left ovary  Measurements: 4.5 x 2.5 x 3.5 centimeters. Within the LEFT ovary there is a mixed echogenicity lesion, with anechoic spaces measuring 3.5 x 2.0 x 3.1 centimeters. There is a thin septation traversing the lesion which contains calcifications. Suspect calcifications in the periphery of this lesion.  Other findings:  No abnormal free fluid  IMPRESSION: 1. Hysterectomy. 2. Indeterminate LEFT ovarian lesion, containing calcifications within a thick septum and also peripheral calcifications. Lesion measures up to 3.5 centimeters. Consider MRI and possible surgical evaluation. Otherwise, pelvic ultrasound would be suggested in 8-12 weeks. . 09/2017 - pelvic MRI FINDINGS: Urinary Tract: Bladder unremarkable. No evidence for urethral diverticulum.  Bowel:  No small bowel or colonic dilatation within the pelvis.Vascular/Lymphatic: No pathologicallyenlarged lymph nodes. Nosignificant vascular abnormality seen.Reproductive:  Uterus surgically absent. Right ovary not discretely visualized.Left ovary measures 3.1 x 4.6 x 3.2 cm. Multiple cysticareas ofvarying size and signal intensity are identified within theparenchyma. Dominant cystic component measures 2.1 cm and long axison today's study. Areas of parenchymal enhancement are seen withinthe parenchyma.ther:  No intraperitoneal free fluid.Musculoskeletal: Lumbosacral fusion.IMPRESSION:1. Left ovary contains multiple small foci of varying size andsignal intensity. Largest focus measures 2.1cm today. These cannotbe definitively characterized but no overtly suspicious appearanceof the left  ovary on today's study. Follow-up MRI in 3 months couldbe used to ensure stability.2.  Nonvisualization rightovarywithout right adnexal mass.3. Uterus surgically absent.  Outpatient Encounter Medications as of 04/04/2018  Medication Sig  . Acetaminophen (TYLENOL 8 HOUR PO) Take 650 mg by mouth.   Marland Kitchen albuterol (VENTOLIN HFA) 108 (90 BASE) MCG/ACT inhaler Inhale 2 puffs into the lungs every 6 (six) hours as needed.    Marland Kitchen aspirin 81 MG tablet Take 81 mg by mouth daily.    Marland Kitchen atorvastatin (LIPITOR) 40 MG tablet Take 1 tablet (40 mg total) by mouth daily at 6 PM.  . Cholecalciferol (VITAMIN D-3) 5000 UNITS TABS Take by mouth daily.   . Coenzyme Q10 100 MG TABS Take 400 mg by mouth daily.  . cyanocobalamin 500 MCG tablet Take 500 mcg by mouth daily.  . Dapagliflozin-metFORMIN HCl ER (XIGDUO XR) 07-998 MG TB24 Take 1 tablet by mouth 2 (two) times daily. Take 1 tablet by mouth twice daily.  . Dulaglutide (TRULICITY) 2.11 HE/1.7EY SOPN Inject 0.75 mg into the skin once a week.  . fluticasone (FLONASE) 50 MCG/ACT nasal spray Place 2 sprays into both nostrils daily.  Marland Kitchen gabapentin (NEURONTIN) 300 MG capsule Take 1 capsule (300 mg total) by mouth 3 (three) times daily.  . Garlic 8144 MG CAPS Take 1 capsule by mouth daily.   Marland Kitchen glipiZIDE (GLUCOTROL XL) 2.5 MG 24 hr tablet Take 1 tablet (2.5 mg total) by mouth daily with breakfast.  . glucose blood (ONE TOUCH ULTRA TEST) test strip Use as instructed once a day.  DX code E11.40  . hydrochlorothiazide (HYDRODIURIL) 25 MG tablet Take 1 tablet (25 mg total) by mouth daily.  . Lancets (ONETOUCH ULTRASOFT) lancets Use as instructed once day.  E11.40  . lisinopril (PRINIVIL,ZESTRIL) 5 MG tablet Take 1 tablet (5 mg total) by mouth daily.  Marland Kitchen omega-3 acid ethyl esters (LOVAZA) 1 g capsule Take 1 capsule (1 g total) by mouth 2 (two) times daily.  . ondansetron (ZOFRAN) 4 MG tablet Take 1 tablet (4 mg total) by mouth as needed for nausea or vomiting.  . potassium chloride SA (K-DUR,KLOR-CON) 20 MEQ tablet Take 1 tablet (20 mEq total) by mouth daily.  .  [DISCONTINUED] atorvastatin (LIPITOR) 40 MG tablet Take 1 tablet (40 mg total) by mouth daily at 6 PM.  . [DISCONTINUED] gabapentin (NEURONTIN) 300 MG capsule Take 1 capsule (300 mg total) by mouth 3 (three) times daily.  . [DISCONTINUED] hydrochlorothiazide (HYDRODIURIL) 25 MG tablet TAKE 1 TABLET BY MOUTH  DAILY  . [DISCONTINUED] omega-3 acid ethyl esters (LOVAZA) 1 g capsule Take 1 capsule (1 g total) by mouth 2 (two) times daily.  . [DISCONTINUED] potassium chloride SA (K-DUR,KLOR-CON) 20 MEQ tablet Take 1 tablet (20 mEq total) by mouth daily.   No facility-administered encounter medications on file as of 04/04/2018.    Allergies  Allergen Reactions  . Penicillins     Causes yeast infections  . Amoxicillin Other (See Comments)    Pt states it causes her a yeast infection   . Erythromycin     Passed out while taking    Past Medical History:  Diagnosis Date  . Arthritis   . Breast abscess 2000   right breast  . Carpal tunnel syndrome   . Degenerative disc disease   . Diabetes mellitus type II   . Eczema of hand   . Hx of colonic polyps 12/02/2014   2004 - 2 diminutive polyps   . Hyperlipidemia   .  Hypertension   . Metatarsal deformity   . Ovarian cyst 07/2017   left   . Shingles 2000  . Tubular adenoma of colon    Past Surgical History:  Procedure Laterality Date  . ABDOMINAL HYSTERECTOMY  1981   menorrhagia  . ABSCESS DRAINAGE Right    breast  . APPENDECTOMY  1966   done with CSxn. Prophylactic  . BREAST BIOPSY Left   . carpel tunnel Right   . CESAREAN SECTION     x2  . COLONOSCOPY    . LUMBAR LAMINECTOMY     [id number]. 2011 L4,L5,S1  . Right rotator cuff repair 09/2015    . TUBAL LIGATION          Past Gynecological History:   GYNECOLOGIC HISTORY:  . No LMP recorded. Patient has had a hysterectomy. 73yo . Menarche: 73 years old . P 2 . Contraceptive h/o OCP . HRT none  . Last Pap NA Family Hx:  Family History  Problem Relation Age of Onset  .  Hypertension Father   . Diabetes Mother   . Stroke Mother   . Hypertension Mother   . High Cholesterol Mother   . Heart disease Mother   . Diabetes Brother   . Breast cancer Daughter 57       unsure if did genetic testing  . Breast cancer Maternal Aunt   . Breast cancer Sister 39  . Colon polyps Brother    Social Hx:  Marland Kitchen Tobacco use: none . Alcohol use: none . Illicit Drug use: none     Review of Systems: Review of Systems  Gastrointestinal: Positive for constipation and diarrhea.  Genitourinary: Positive for pelvic pain.   Musculoskeletal: Positive for arthralgias.  All other systems reviewed and are negative.   Vitals:  Vitals:   04/04/18 1141  BP: 121/73  Pulse: 71  Resp: 18  Temp: 97.7 F (36.5 C)  SpO2: 100%   Vitals:   04/04/18 1141  Weight: 181 lb (82.1 kg)  Height: 5' 8.5" (1.74 m)   Body mass index is 27.12 kg/m.  Physical Exam: General :  Well developed, 73 y.o., female in no apparent distress HEENT:  Normocephalic/atraumatic, symmetric, EOMI, eyelids normal Neck:   Supple, no masses.  Lymphatics:  No cervical/ submandibular/ supraclavicular/ infraclavicular/ inguinal adenopathy Respiratory:  Respirations unlabored, no use of accessory muscles CV:   Deferred Breast:  Deferred Musculoskeletal: No CVA tenderness, normal muscle strength. Abdomen:  Soft, non-tender and nondistended. No evidence of hernia. No masses. Extremities:  No lymphedema, no erythema, non-tender. Skin:   Normal inspection Neuro/Psych:  No focal motor deficit, no abnormal mental status. Normal gait. Normal affect. Alert and oriented to person, place, and time  Genito Urinary: Vulva: Normal external female genitalia.  Bladder/urethra: Urethral meatus normal in size and location. No lesions or   masses, well supported bladder Speculum exam: Vagina: No lesion, no discharge, no bleeding. Bimanual exam: Cervix/Uterus: Surgically absent Uterus: Normal size, mobile.  Adnexal region:  No masses. Rectovaginal:  Good tone, no masses, no cul de sac nodularity, no parametrial involvement or nodularity.   Assessment  Adnexal mass  Plan  Complexity of visit ? This is a new problem and additional workup will include a CEA level ? Data reviewed ? I independently reviewed the images and the radiology reports and discussed my interpretation in the presence of the patient and her husband today ? The mass is complex, irregular in shape and a portion of it has doppler flow ? I  reviewed her referring doctor's office notes and I have summarized in the HPI ? History was obtained from the patient and the chart ? We reviewed normal tumor markers ? This is an undiagnosed new problem with uncertain prognosis and management will include surgery with comorbidities of diagnosis of diabetes and previous hysterectomy increasing surgical risks. 1. Reassuring is the normal CA125, although I explained to her I don't like the complexity of the mass and the vascular flow in one portion of the mass ? There has not been a great change in size since the summer 2019 and hopefully that too is reassuirng 2. Surgical discussion ? We discussed proceeding with laparoscopic BSO, possible Exlap/staging. As usual we discussed simply USO should the pathology be benign and removal of the contralateral (normal) ovary is judged to be at high risk for injury with removal. ? I will check a CEA on preop labs ? She understands the concept and limitations of frozen section ? The surgical sketch was reviewed including the risks, benefits, and alternatives of surgery and she was given a copy for her records 3. I will have her return 2-3 weeks postop to review the pathology and treatment plan.  Face to face time with patient was 60 minutes. Over 50% of this time was spent on counseling and coordination of care.   Mart Piggs, MD Gynecologic Oncologist 04/04/2018, 1:19 PM    Cc: Brien Few, MD  (Referring Ob/Gyn) Nche, Charlene Brooke, NP  (PCP)

## 2018-04-11 NOTE — Patient Instructions (Addendum)
Sandra Ray  04/11/2018   Your procedure is scheduled on: 04-16-18    Report to [facility] Main  Entrance    Report to Admitting at 5:30 AM    Call this number if you have problems the morning of surgery [phone]    Remember: Do not eat food or drink liquids :After Midnight.   Eat a light diet the day before surgery.  Examples including soups, broths, toast, yogurt, mashed potatoes.  Things to avoid include carbonated beverages (fizzy beverages), raw fruits and raw vegetables, or beans.   If your bowels are filled with gas, your surgeon will have difficulty visualizing your pelvic organs which increases your surgical risks.Eat a light diet the day before surgery.  Examples including soups, broths, toast, yogurt, mashed potatoes.  Things to avoid include carbonated beverages (fizzy beverages), raw fruits and raw vegetables, or beans.   If your bowels are filled with gas, your surgeon will have difficulty visualizing your pelvic organs which increases your surgical risks.       BRUSH YOUR TEETH MORNING OF SURGERY AND RINSE YOUR MOUTH OUT, NO CHEWING GUM CANDY OR MINTS.     Take these medicines the morning of surgery with A SIP OF WATER: Gabapentin (Neurontin). You may use your nasal spray.   DO NOT TAKE ANY DIABETIC MEDICATIONS DAY OF YOUR SURGERY                               You may not have any metal on your body including hair pins and              piercings  Do not wear jewelry, make-up, lotions, powders or perfumes, deodorant             Do not wear nail polish.  Do not shave  48 hours prior to surgery.     Do not bring valuables to the hospital. [place].  Contacts, dentures or bridgework may not be worn into surgery. .   Patients discharged the day of surgery will not be allowed to drive home. IF YOU ARE HAVING SURGERY AND GOING HOME THE SAME DAY, YOU MUST HAVE AN ADULT TO DRIVE YOU HOME AND  BE WITH YOU FOR 24 HOURS. YOU MAY GO HOME BY TAXI OR UBER OR ORTHERWISE, BUT AN ADULT MUST ACCOMPANY YOU HOME AND STAY WITH YOU FOR 24 HOURS.     Name and phone number of your driver: Sandra Ray [phone]  Special Instructions: N/A              Please read over the following fact sheets you were given: _____________________________________________________________________             How to Manage Your Diabetes Before and After Surgery  Why is it important to control my blood sugar before and after surgery? . Improving blood sugar levels before and after surgery helps healing and can limit problems. . A way of improving blood sugar control is eating a healthy diet by: o  Eating less sugar and carbohydrates o  Increasing activity/exercise o  Talking with your doctor about reaching your blood sugar goals . High blood sugars (greater than 180 mg/dL) can raise your risk of infections and slow your recovery,  so you will need to focus on controlling your diabetes during the weeks before surgery. . Make sure that the doctor who takes care of your diabetes knows about your planned surgery including the date and location.  How do I manage my blood sugar before surgery? . Check your blood sugar at least 4 times a day, starting 2 days before surgery, to make sure that the level is not too high or low. o Check your blood sugar the morning of your surgery when you wake up and every 2 hours until you get to the Short Stay unit. . If your blood sugar is less than 70 mg/dL, you will need to treat for low blood sugar: o Do not take insulin. o Treat a low blood sugar (less than 70 mg/dL) with  cup of clear juice (cranberry or apple), 4 glucose tablets, OR glucose gel. o Recheck blood sugar in 15 minutes after treatment (to make sure it is greater than 70 mg/dL). If your blood sugar is not greater than 70 mg/dL on recheck, call [phone] for further instructions. . Report your blood sugar to the  short stay nurse when you get to Short Stay.  . If you are admitted to the hospital after surgery: o Your blood sugar will be checked by the staff and you will probably be given insulin after surgery (instead of oral diabetes medicines) to make sure you have good blood sugar levels. o The goal for blood sugar control after surgery is 80-180 mg/dL.   WHAT DO I DO ABOUT MY DIABETES MEDICATION?  Marland Kitchen Do not take oral diabetes medicines (pills) the morning of surgery.  . THE DAY BEFORE SURGERY, take only your morning/lunch dose of Glipizide. Do not take your Dapagliflozine/Metformin.   . The day of surgery, do not take other diabetes injectables, including Byetta (exenatide), Bydureon (exenatide ER), Victoza (liraglutide), or Trulicity (dulaglutide).     Reviewed and Endorsed by [facility] Patient Education Committee, August 2015  [facility] - Preparing for Surgery Before surgery, you can play an important role.  Because skin is not sterile, your skin needs to be as free of germs as possible.  You can reduce the number of germs on your skin by washing with CHG (chlorahexidine gluconate) soap before surgery.  CHG is an antiseptic cleaner which kills germs and bonds with the skin to continue killing germs even after washing. Please DO NOT use if you have an allergy to CHG or antibacterial soaps.  If your skin becomes reddened/irritated stop using the CHG and inform your nurse when you arrive at Short Stay. Do not shave (including legs and underarms) for at least 48 hours prior to the first CHG shower.  You may shave your face/neck. Please follow these instructions carefully:  1.  Shower with CHG Soap the night before surgery and the  morning of Surgery.  2.  If you choose to wash your hair, wash your hair first as usual with your  normal  shampoo.  3.  After you shampoo, rinse your hair and body thoroughly to remove the  shampoo.                           4.  Use CHG as you would any other liquid  soap.  You can apply chg directly  to the skin and wash  Gently with a scrungie or clean washcloth.  5.  Apply the CHG Soap to your body ONLY FROM THE NECK DOWN.   Do not use on face/ open                           Wound or open sores. Avoid contact with eyes, ears mouth and genitals (private parts).                       Wash face,  Genitals (private parts) with your normal soap.             6.  Wash thoroughly, paying special attention to the area where your surgery  will be performed.  7.  Thoroughly rinse your body with warm water from the neck down.  8.  DO NOT shower/wash with your normal soap after using and rinsing off  the CHG Soap.                9.  Pat yourself dry with a clean towel.            10.  Wear clean pajamas.            11.  Place clean sheets on your bed the night of your first shower and do not  sleep with pets. Day of Surgery : Do not apply any lotions/deodorants the morning of surgery.  Please wear clean clothes to the hospital/surgery center.  FAILURE TO FOLLOW THESE INSTRUCTIONS MAY RESULT IN THE CANCELLATION OF YOUR SURGERY PATIENT SIGNATURE_________________________________  NURSE SIGNATURE__________________________________  ________________________________________________________________________  WHAT IS A BLOOD TRANSFUSION? Blood Transfusion Information  A transfusion is the replacement of blood or some of its parts. Blood is made up of multiple cells which provide different functions.  Red blood cells carry oxygen and are used for blood loss replacement.  White blood cells fight against infection.  Platelets control bleeding.  Plasma helps clot blood.  Other blood products are available for specialized needs, such as hemophilia or other clotting disorders. BEFORE THE TRANSFUSION  Who gives blood for transfusions?   Healthy volunteers who are fully evaluated to make sure their blood is safe. This is blood bank  blood. Transfusion therapy is the safest it has ever been in the practice of medicine. Before blood is taken from a donor, a complete history is taken to make sure that person has no history of diseases nor engages in risky social behavior (examples are intravenous drug use or sexual activity with multiple partners). The donor's travel history is screened to minimize risk of transmitting infections, such as malaria. The donated blood is tested for signs of infectious diseases, such as HIV and hepatitis. The blood is then tested to be sure it is compatible with you in order to minimize the chance of a transfusion reaction. If you or a relative donates blood, this is often done in anticipation of surgery and is not appropriate for emergency situations. It takes many days to process the donated blood. RISKS AND COMPLICATIONS Although transfusion therapy is very safe and saves many lives, the main dangers of transfusion include:   Getting an infectious disease.  Developing a transfusion reaction. This is an allergic reaction to something in the blood you were given. Every precaution is taken to prevent this. The decision to have a blood transfusion has been considered carefully by your caregiver before blood is given. Blood is not given unless the benefits outweigh  the risks. AFTER THE TRANSFUSION  Right after receiving a blood transfusion, you will usually feel much better and more energetic. This is especially true if your red blood cells have gotten low (anemic). The transfusion raises the level of the red blood cells which carry oxygen, and this usually causes an energy increase.  The nurse administering the transfusion will monitor you carefully for complications. HOME CARE INSTRUCTIONS  No special instructions are needed after a transfusion. You may find your energy is better. Speak with your caregiver about any limitations on activity for underlying diseases you may have. SEEK MEDICAL CARE IF:    Your condition is not improving after your transfusion.  You develop redness or irritation at the intravenous (IV) site. SEEK IMMEDIATE MEDICAL CARE IF:  Any of the following symptoms occur over the next 12 hours:  Shaking chills.  You have a temperature by mouth above 102 F (38.9 C), not controlled by medicine.  Chest, back, or muscle pain.  People around you feel you are not acting correctly or are confused.  Shortness of breath or difficulty breathing.  Dizziness and fainting.  You get a rash or develop hives.  You have a decrease in urine output.  Your urine turns a dark color or changes to pink, red, or brown. Any of the following symptoms occur over the next 10 days:  You have a temperature by mouth above 102 F (38.9 C), not controlled by medicine.  Shortness of breath.  Weakness after normal activity.  The white part of the eye turns yellow (jaundice).  You have a decrease in the amount of urine or are urinating less often.  Your urine turns a dark color or changes to pink, red, or brown. Document Released: 02/25/2000 Document Revised: 05/22/2011 Document Reviewed: 10/14/2007 [facility] Patient Information 2014 [place], [place].  _______________________________________________________________________

## 2018-04-12 ENCOUNTER — Encounter (HOSPITAL_COMMUNITY)
Admission: RE | Admit: 2018-04-12 | Discharge: 2018-04-12 | Disposition: A | Discharge status: Home / Self Care | Payer: Medicare Other | Source: Ambulatory Visit | Attending: Obstetrics | Admitting: Obstetrics

## 2018-04-12 ENCOUNTER — Encounter (HOSPITAL_COMMUNITY): Payer: Self-pay

## 2018-04-12 ENCOUNTER — Other Ambulatory Visit: Payer: Self-pay

## 2018-04-12 DIAGNOSIS — Z01818 Encounter for other preprocedural examination: Secondary | ICD-10-CM | POA: Diagnosis present

## 2018-04-12 HISTORY — DX: Nausea with vomiting, unspecified: R11.2

## 2018-04-12 HISTORY — DX: Other specified postprocedural states: Z98.890

## 2018-04-12 HISTORY — DX: Other complications of anesthesia, initial encounter: T88.59XA

## 2018-04-12 HISTORY — DX: Adverse effect of unspecified anesthetic, initial encounter: T41.45XA

## 2018-04-12 LAB — CBC
HCT: 47.1 % — ABNORMAL HIGH (ref 36.0–46.0)
Hemoglobin: 15.1 g/dL — ABNORMAL HIGH (ref 12.0–15.0)
MCH: 29.3 pg (ref 26.0–34.0)
MCHC: 32.1 g/dL (ref 30.0–36.0)
MCV: 91.5 fL (ref 80.0–100.0)
NRBC: 0 % (ref 0.0–0.2)
Platelets: 343 10*3/uL (ref 150–400)
RBC: 5.15 MIL/uL — ABNORMAL HIGH (ref 3.87–5.11)
RDW: 14 % (ref 11.5–15.5)
WBC: 11.7 10*3/uL — ABNORMAL HIGH (ref 4.0–10.5)

## 2018-04-12 LAB — COMPREHENSIVE METABOLIC PANEL
ALT: 31 U/L (ref 0–44)
AST: 31 U/L (ref 15–41)
Albumin: 4.8 g/dL (ref 3.5–5.0)
Alkaline Phosphatase: 46 U/L (ref 38–126)
Anion gap: 12 (ref 5–15)
BUN: 22 mg/dL (ref 8–23)
CO2: 26 mmol/L (ref 22–32)
Calcium: 9.7 mg/dL (ref 8.9–10.3)
Chloride: 98 mmol/L (ref 98–111)
Creatinine, Ser: 0.49 mg/dL (ref 0.44–1.00)
GFR calc Af Amer: >60 mL/min (ref >60)
GFR calc non Af Amer: >60 mL/min (ref >60)
Glucose, Bld: 146 mg/dL — ABNORMAL HIGH (ref 70–99)
POTASSIUM: 4 mmol/L (ref 3.5–5.1)
Sodium: 136 mmol/L (ref 135–145)
TOTAL PROTEIN: 7.6 g/dL (ref 6.5–8.1)
Total Bilirubin: 0.8 mg/dL (ref 0.3–1.2)

## 2018-04-12 LAB — URINALYSIS, ROUTINE W REFLEX MICROSCOPIC
Bilirubin Urine: NEGATIVE
Glucose, UA: >=500 mg/dL — AB
Ketones, ur: 5 mg/dL — AB
Nitrite: NEGATIVE
PH: 5 (ref 5.0–8.0)
Protein, ur: NEGATIVE mg/dL
Specific Gravity, Urine: 1.03 (ref 1.005–1.030)

## 2018-04-12 LAB — HEMOGLOBIN A1C
Hgb A1c MFr Bld: 7.5 % — ABNORMAL HIGH (ref 4.8–5.6)
Mean Plasma Glucose: 168.55 mg/dL

## 2018-04-12 LAB — GLUCOSE, CAPILLARY: Glucose-Capillary: 177 mg/dL — ABNORMAL HIGH (ref 70–99)

## 2018-04-12 NOTE — Progress Notes (Addendum)
04-12-18 UA results routed to Dr. Gerarda Fraction for review  Addendum: Per lab, pt has positives antibodies. T&S to be redrawn on day of surgery. Order placed.

## 2018-04-13 LAB — CEA: CEA: 2.7 ng/mL (ref 0.0–4.7)

## 2018-04-14 LAB — URINE CULTURE: Culture: 20000 — AB

## 2018-04-16 ENCOUNTER — Telehealth: Payer: Self-pay | Admitting: *Deleted

## 2018-04-16 ENCOUNTER — Encounter (HOSPITAL_COMMUNITY): Payer: Self-pay | Admitting: Emergency Medicine

## 2018-04-16 ENCOUNTER — Ambulatory Visit (HOSPITAL_COMMUNITY)
Admission: RE | Admit: 2018-04-16 | Discharge: 2018-04-16 | Disposition: A | Discharge status: Home / Self Care | Payer: Medicare Other | Attending: Obstetrics | Admitting: Obstetrics

## 2018-04-16 ENCOUNTER — Ambulatory Visit (HOSPITAL_COMMUNITY): Payer: Medicare Other | Admitting: Certified Registered Nurse Anesthetist

## 2018-04-16 ENCOUNTER — Encounter (HOSPITAL_COMMUNITY)
Admission: RE | Disposition: A | Discharge status: Home / Self Care | Payer: Self-pay | Source: Home / Self Care | Attending: Obstetrics

## 2018-04-16 ENCOUNTER — Ambulatory Visit (HOSPITAL_COMMUNITY): Payer: Medicare Other | Admitting: Physician Assistant

## 2018-04-16 DIAGNOSIS — N83202 Unspecified ovarian cyst, left side: Secondary | ICD-10-CM

## 2018-04-16 DIAGNOSIS — Z79899 Other long term (current) drug therapy: Secondary | ICD-10-CM | POA: Insufficient documentation

## 2018-04-16 DIAGNOSIS — N736 Female pelvic peritoneal adhesions (postinfective): Secondary | ICD-10-CM | POA: Diagnosis not present

## 2018-04-16 DIAGNOSIS — Z90710 Acquired absence of both cervix and uterus: Secondary | ICD-10-CM | POA: Diagnosis not present

## 2018-04-16 DIAGNOSIS — N838 Other noninflammatory disorders of ovary, fallopian tube and broad ligament: Secondary | ICD-10-CM | POA: Insufficient documentation

## 2018-04-16 DIAGNOSIS — Z7984 Long term (current) use of oral hypoglycemic drugs: Secondary | ICD-10-CM | POA: Diagnosis not present

## 2018-04-16 DIAGNOSIS — E119 Type 2 diabetes mellitus without complications: Secondary | ICD-10-CM | POA: Insufficient documentation

## 2018-04-16 DIAGNOSIS — I10 Essential (primary) hypertension: Secondary | ICD-10-CM | POA: Insufficient documentation

## 2018-04-16 DIAGNOSIS — R1900 Intra-abdominal and pelvic swelling, mass and lump, unspecified site: Secondary | ICD-10-CM

## 2018-04-16 DIAGNOSIS — D271 Benign neoplasm of left ovary: Secondary | ICD-10-CM | POA: Diagnosis not present

## 2018-04-16 DIAGNOSIS — Z7982 Long term (current) use of aspirin: Secondary | ICD-10-CM | POA: Diagnosis not present

## 2018-04-16 DIAGNOSIS — E785 Hyperlipidemia, unspecified: Secondary | ICD-10-CM | POA: Diagnosis not present

## 2018-04-16 HISTORY — PX: ROBOTIC ASSISTED SALPINGO OOPHERECTOMY: SHX6082

## 2018-04-16 LAB — GLUCOSE, CAPILLARY
Glucose-Capillary: 109 mg/dL — ABNORMAL HIGH (ref 70–99)
Glucose-Capillary: 137 mg/dL — ABNORMAL HIGH (ref 70–99)

## 2018-04-16 LAB — TYPE AND SCREEN
ABO/RH(D): O NEG
Antibody Screen: POSITIVE

## 2018-04-16 SURGERY — SALPINGO-OOPHORECTOMY, ROBOT-ASSISTED
Anesthesia: General | Laterality: Left

## 2018-04-16 MED ORDER — SUGAMMADEX SODIUM 200 MG/2ML IV SOLN
INTRAVENOUS | Status: DC | PRN
  Administered 2018-04-16: 200 mg via INTRAVENOUS

## 2018-04-16 MED ORDER — BUPIVACAINE HCL (PF) 0.25 % IJ SOLN
INTRAMUSCULAR | Status: AC
  Filled 2018-04-16: qty 30

## 2018-04-16 MED ORDER — SUGAMMADEX SODIUM 200 MG/2ML IV SOLN
INTRAVENOUS | Status: AC
  Filled 2018-04-16: qty 2

## 2018-04-16 MED ORDER — HYDROCODONE-ACETAMINOPHEN 5-325 MG PO TABS: 1.0000 | ORAL_TABLET | Freq: Four times a day (QID) | ORAL | 0 refills | Status: DC | PRN

## 2018-04-16 MED ORDER — ROCURONIUM BROMIDE 50 MG/5ML IV SOSY
PREFILLED_SYRINGE | INTRAVENOUS | Status: DC | PRN
  Administered 2018-04-16: 10 mg via INTRAVENOUS
  Administered 2018-04-16: 60 mg via INTRAVENOUS
  Administered 2018-04-16: 10 mg via INTRAVENOUS

## 2018-04-16 MED ORDER — DEXAMETHASONE SODIUM PHOSPHATE 10 MG/ML IJ SOLN
INTRAMUSCULAR | Status: DC | PRN
  Administered 2018-04-16: 10 mg via INTRAVENOUS

## 2018-04-16 MED ORDER — PROPOFOL 10 MG/ML IV BOLUS
INTRAVENOUS | Status: AC
  Filled 2018-04-16: qty 20

## 2018-04-16 MED ORDER — KETOROLAC TROMETHAMINE 15 MG/ML IJ SOLN
INTRAMUSCULAR | Status: AC
  Administered 2018-04-16: 15 mg
  Filled 2018-04-16: qty 1

## 2018-04-16 MED ORDER — DEXAMETHASONE SODIUM PHOSPHATE 10 MG/ML IJ SOLN
INTRAMUSCULAR | Status: AC
  Filled 2018-04-16: qty 1

## 2018-04-16 MED ORDER — OXYCODONE HCL 5 MG/5ML PO SOLN
5.0000 mg | Freq: Once | ORAL | Status: DC | PRN
  Filled 2018-04-16: qty 5

## 2018-04-16 MED ORDER — CEFAZOLIN SODIUM-DEXTROSE 2-4 GM/100ML-% IV SOLN
2.0000 g | INTRAVENOUS | Status: AC
  Administered 2018-04-16: 2 g via INTRAVENOUS
  Filled 2018-04-16: qty 100

## 2018-04-16 MED ORDER — FENTANYL CITRATE (PF) 250 MCG/5ML IJ SOLN
INTRAMUSCULAR | Status: AC
  Filled 2018-04-16: qty 5

## 2018-04-16 MED ORDER — SCOPOLAMINE 1 MG/3DAYS TD PT72
1.0000 | MEDICATED_PATCH | TRANSDERMAL | Status: AC
  Administered 2018-04-16: 1 via TRANSDERMAL
  Filled 2018-04-16: qty 1

## 2018-04-16 MED ORDER — FENTANYL CITRATE (PF) 100 MCG/2ML IJ SOLN: 25.0000 ug | INTRAMUSCULAR | Status: DC | PRN

## 2018-04-16 MED ORDER — FENTANYL CITRATE (PF) 100 MCG/2ML IJ SOLN
INTRAMUSCULAR | Status: DC | PRN
  Administered 2018-04-16 (×5): 50 ug via INTRAVENOUS

## 2018-04-16 MED ORDER — ROCURONIUM BROMIDE 100 MG/10ML IV SOLN
INTRAVENOUS | Status: AC
  Filled 2018-04-16: qty 1

## 2018-04-16 MED ORDER — KETOROLAC TROMETHAMINE 30 MG/ML IJ SOLN: 15.0000 mg | Freq: Once | INTRAMUSCULAR | Status: DC | PRN

## 2018-04-16 MED ORDER — PHENYLEPHRINE 40 MCG/ML (10ML) SYRINGE FOR IV PUSH (FOR BLOOD PRESSURE SUPPORT)
PREFILLED_SYRINGE | INTRAVENOUS | Status: AC
  Filled 2018-04-16: qty 10

## 2018-04-16 MED ORDER — PROPOFOL 10 MG/ML IV BOLUS
INTRAVENOUS | Status: DC | PRN
  Administered 2018-04-16: 150 mg via INTRAVENOUS
  Administered 2018-04-16: 20 mg via INTRAVENOUS
  Administered 2018-04-16: 25 mg via INTRAVENOUS
  Administered 2018-04-16: 20 mg via INTRAVENOUS
  Administered 2018-04-16: 25 mg via INTRAVENOUS

## 2018-04-16 MED ORDER — LACTATED RINGERS IR SOLN
Status: DC | PRN
  Administered 2018-04-16: 1000 mL

## 2018-04-16 MED ORDER — SUCCINYLCHOLINE CHLORIDE 200 MG/10ML IV SOSY
PREFILLED_SYRINGE | INTRAVENOUS | Status: AC
  Filled 2018-04-16: qty 10

## 2018-04-16 MED ORDER — DEXAMETHASONE SODIUM PHOSPHATE 4 MG/ML IJ SOLN: 4.0000 mg | INTRAMUSCULAR | Status: DC

## 2018-04-16 MED ORDER — EPHEDRINE 5 MG/ML INJ
INTRAVENOUS | Status: AC
  Filled 2018-04-16: qty 10

## 2018-04-16 MED ORDER — ACETAMINOPHEN 500 MG PO TABS
1000.0000 mg | ORAL_TABLET | ORAL | Status: AC
  Administered 2018-04-16: 1000 mg via ORAL
  Filled 2018-04-16: qty 2

## 2018-04-16 MED ORDER — PROPOFOL 10 MG/ML IV BOLUS
INTRAVENOUS | Status: AC
  Filled 2018-04-16: qty 40

## 2018-04-16 MED ORDER — LACTATED RINGERS IV SOLN
INTRAVENOUS | Status: DC
  Administered 2018-04-16 (×2): via INTRAVENOUS

## 2018-04-16 MED ORDER — PHENYLEPHRINE 40 MCG/ML (10ML) SYRINGE FOR IV PUSH (FOR BLOOD PRESSURE SUPPORT)
PREFILLED_SYRINGE | INTRAVENOUS | Status: DC | PRN
  Administered 2018-04-16 (×4): 80 ug via INTRAVENOUS

## 2018-04-16 MED ORDER — OXYCODONE HCL 5 MG PO TABS: 5.0000 mg | ORAL_TABLET | Freq: Once | ORAL | Status: DC | PRN

## 2018-04-16 MED ORDER — PROPOFOL 500 MG/50ML IV EMUL
INTRAVENOUS | Status: DC | PRN
  Administered 2018-04-16: 125 ug/kg/min via INTRAVENOUS

## 2018-04-16 MED ORDER — BUPIVACAINE HCL (PF) 0.25 % IJ SOLN
INTRAMUSCULAR | Status: DC | PRN
  Administered 2018-04-16: 30 mL

## 2018-04-16 MED ORDER — PROPOFOL 10 MG/ML IV BOLUS
INTRAVENOUS | Status: AC
  Filled 2018-04-16: qty 60

## 2018-04-16 MED ORDER — PROMETHAZINE HCL 25 MG/ML IJ SOLN: 6.2500 mg | INTRAMUSCULAR | Status: DC | PRN

## 2018-04-16 MED ORDER — ONDANSETRON HCL 4 MG/2ML IJ SOLN
INTRAMUSCULAR | Status: AC
  Filled 2018-04-16: qty 2

## 2018-04-16 MED ORDER — ONDANSETRON HCL 4 MG/2ML IJ SOLN
INTRAMUSCULAR | Status: DC | PRN
  Administered 2018-04-16: 4 mg via INTRAVENOUS

## 2018-04-16 MED ORDER — EPHEDRINE SULFATE-NACL 50-0.9 MG/10ML-% IV SOSY
PREFILLED_SYRINGE | INTRAVENOUS | Status: DC | PRN
  Administered 2018-04-16: 5 mg via INTRAVENOUS
  Administered 2018-04-16: 10 mg via INTRAVENOUS

## 2018-04-16 MED ORDER — LIDOCAINE 2% (20 MG/ML) 5 ML SYRINGE
INTRAMUSCULAR | Status: DC | PRN
  Administered 2018-04-16: 60 mg via INTRAVENOUS

## 2018-04-16 MED ORDER — LIDOCAINE 2% (20 MG/ML) 5 ML SYRINGE
INTRAMUSCULAR | Status: AC
  Filled 2018-04-16: qty 5

## 2018-04-16 SURGICAL SUPPLY — 74 items
APPLICATOR COTTON TIP 6 STRL (MISCELLANEOUS) ×1 IMPLANT
APPLICATOR COTTON TIP 6IN STRL (MISCELLANEOUS) ×3
APPLICATOR SURGIFLO ENDO (HEMOSTASIS) IMPLANT
BAG LAPAROSCOPIC 12 15 PORT 16 (BASKET) IMPLANT
BAG RETRIEVAL 12/15 (BASKET)
BAG RETRIEVAL 12/15MM (BASKET)
COVER BACK TABLE 60X90IN (DRAPES) IMPLANT
COVER TIP SHEARS 8 DVNC (MISCELLANEOUS) ×1 IMPLANT
COVER TIP SHEARS 8MM DA VINCI (MISCELLANEOUS) ×2
COVER WAND RF STERILE (DRAPES) IMPLANT
DERMABOND ADVANCED (GAUZE/BANDAGES/DRESSINGS) ×2
DERMABOND ADVANCED .7 DNX12 (GAUZE/BANDAGES/DRESSINGS) ×1 IMPLANT
DRAPE ARM DVNC X/XI (DISPOSABLE) ×4 IMPLANT
DRAPE COLUMN DVNC XI (DISPOSABLE) ×1 IMPLANT
DRAPE DA VINCI XI ARM (DISPOSABLE) ×8
DRAPE DA VINCI XI COLUMN (DISPOSABLE) ×2
DRAPE SHEET LG 3/4 BI-LAMINATE (DRAPES) ×3 IMPLANT
DRAPE SURG IRRIG POUCH 19X23 (DRAPES) ×3 IMPLANT
DRAPE UNDERBUTTOCKS STRL (DRAPE) ×3 IMPLANT
ELECT REM PT RETURN 15FT ADLT (MISCELLANEOUS) ×3 IMPLANT
GLOVE BIO SURGEON STRL SZ 6 (GLOVE) ×3 IMPLANT
GLOVE BIO SURGEON STRL SZ 6.5 (GLOVE) IMPLANT
GLOVE BIO SURGEONS STRL SZ 6.5 (GLOVE)
GLOVE BIOGEL PI IND STRL 7.0 (GLOVE) ×3 IMPLANT
GLOVE BIOGEL PI INDICATOR 7.0 (GLOVE) ×6
GLOVE SURG SS PI 6.5 STRL IVOR (GLOVE) ×9 IMPLANT
GOWN STRL REUS W/ TWL LRG LVL3 (GOWN DISPOSABLE) ×1 IMPLANT
GOWN STRL REUS W/TWL LRG LVL3 (GOWN DISPOSABLE) ×2
GOWN STRL REUS W/TWL XL LVL3 (GOWN DISPOSABLE) ×6 IMPLANT
GYRUS RUMI II 2.5CM BLUE (DISPOSABLE)
GYRUS RUMI II 3.5CM BLUE (DISPOSABLE)
HOLDER FOLEY CATH W/STRAP (MISCELLANEOUS) IMPLANT
IRRIG SUCT STRYKERFLOW 2 WTIP (MISCELLANEOUS) ×3
IRRIGATION SUCT STRKRFLW 2 WTP (MISCELLANEOUS) ×1 IMPLANT
KIT PROCEDURE DA VINCI SI (MISCELLANEOUS)
KIT PROCEDURE DVNC SI (MISCELLANEOUS) IMPLANT
MANIPULATOR UTERINE 4.5 ZUMI (MISCELLANEOUS) IMPLANT
NEEDLE HYPO 25X1 1.5 SAFETY (NEEDLE) ×3 IMPLANT
NEEDLE SPNL 18GX3.5 QUINCKE PK (NEEDLE) IMPLANT
OBTURATOR OPTICAL STANDARD 8MM (TROCAR) ×2
OBTURATOR OPTICAL STND 8 DVNC (TROCAR) ×1
OBTURATOR OPTICALSTD 8 DVNC (TROCAR) ×1 IMPLANT
PACK ROBOT GYN CUSTOM WL (TRAY / TRAY PROCEDURE) ×3 IMPLANT
PAD POSITIONING PINK XL (MISCELLANEOUS) ×3 IMPLANT
PORT ACCESS TROCAR AIRSEAL 12 (TROCAR) ×1 IMPLANT
PORT ACCESS TROCAR AIRSEAL 5M (TROCAR) ×2
POUCH SPECIMEN RETRIEVAL 10MM (ENDOMECHANICALS) ×3 IMPLANT
RUMI II 3.0CM BLUE KOH-EFFICIE (DISPOSABLE) IMPLANT
RUMI II GYRUS 2.5CM BLUE (DISPOSABLE) IMPLANT
RUMI II GYRUS 3.5CM BLUE (DISPOSABLE) IMPLANT
SCISSORS LAP 5X35 DISP (ENDOMECHANICALS) ×3 IMPLANT
SEAL CANN UNIV 5-8 DVNC XI (MISCELLANEOUS) ×3 IMPLANT
SEAL XI 5MM-8MM UNIVERSAL (MISCELLANEOUS) ×6
SET TRI-LUMEN FLTR TB AIRSEAL (TUBING) ×3 IMPLANT
SURGIFLO W/THROMBIN 8M KIT (HEMOSTASIS) IMPLANT
SUT VIC AB 0 CT1 27 (SUTURE)
SUT VIC AB 0 CT1 27XBRD ANTBC (SUTURE) IMPLANT
SUT VIC AB 4-0 PS2 27 (SUTURE) ×6 IMPLANT
SUT VLOC 180 0 9IN  GS21 (SUTURE)
SUT VLOC 180 0 9IN GS21 (SUTURE) IMPLANT
SYR 10ML LL (SYRINGE) IMPLANT
SYS RETRIEVAL 5MM INZII UNIV (BASKET) ×3
SYSTEM RETRIEVL 5MM INZII UNIV (BASKET) ×1 IMPLANT
TIP RUMI ORANGE 6.7MMX12CM (TIP) IMPLANT
TIP UTERINE 5.1X6CM LAV DISP (MISCELLANEOUS) IMPLANT
TIP UTERINE 6.7X10CM GRN DISP (MISCELLANEOUS) IMPLANT
TIP UTERINE 6.7X6CM WHT DISP (MISCELLANEOUS) IMPLANT
TIP UTERINE 6.7X8CM BLUE DISP (MISCELLANEOUS) IMPLANT
TOWEL OR NON WOVEN STRL DISP B (DISPOSABLE) ×3 IMPLANT
TRAP SPECIMEN MUCOUS 40CC (MISCELLANEOUS) ×3 IMPLANT
TRAY FOLEY CATH 14FR (SET/KITS/TRAYS/PACK) ×3 IMPLANT
TRAY FOLEY MTR SLVR 16FR STAT (SET/KITS/TRAYS/PACK) IMPLANT
UNDERPAD 30X30 (UNDERPADS AND DIAPERS) ×3 IMPLANT
WATER STERILE IRR 1000ML POUR (IV SOLUTION) ×3 IMPLANT

## 2018-04-16 NOTE — Anesthesia Preprocedure Evaluation (Addendum)
Anesthesia Evaluation  Patient identified by MRN, date of birth, ID band Patient awake    Reviewed: Allergy & Precautions, NPO status , Patient's Chart, lab work & pertinent test results  History of Anesthesia Complications (+) PONV  Airway Mallampati: II  TM Distance: >3 FB Neck ROM: Full    Dental no notable dental hx.    Pulmonary neg pulmonary ROS,    Pulmonary exam normal breath sounds clear to auscultation       Cardiovascular hypertension, Normal cardiovascular exam Rhythm:Regular Rate:Normal     Neuro/Psych negative neurological ROS  negative psych ROS   GI/Hepatic negative GI ROS, Neg liver ROS,   Endo/Other  diabetes  Renal/GU negative Renal ROS  negative genitourinary   Musculoskeletal negative musculoskeletal ROS (+)   Abdominal   Peds negative pediatric ROS (+)  Hematology negative hematology ROS (+)   Anesthesia Other Findings   Reproductive/Obstetrics negative OB ROS                            Anesthesia Physical Anesthesia Plan  ASA: II  Anesthesia Plan: General   Post-op Pain Management:    Induction: Intravenous  PONV Risk Score and Plan: 4 or greater and Scopolamine patch - Pre-op, Ondansetron, Dexamethasone and Treatment may vary due to age or medical condition  Airway Management Planned: Oral ETT  Additional Equipment:   Intra-op Plan:   Post-operative Plan: Extubation in OR  Informed Consent: I have reviewed the patients History and Physical, chart, labs and discussed the procedure including the risks, benefits and alternatives for the proposed anesthesia with the patient or authorized representative who has indicated his/her understanding and acceptance.     Dental advisory given  Plan Discussed with: CRNA and Surgeon  Anesthesia Plan Comments:         Anesthesia Quick Evaluation

## 2018-04-16 NOTE — Anesthesia Procedure Notes (Signed)
Procedure Name: Intubation Date/Time: 04/16/2018 7:31 AM Performed by: Maxwell Caul, CRNA Pre-anesthesia Checklist: Patient identified, Emergency Drugs available, Suction available and Patient being monitored Patient Re-evaluated:Patient Re-evaluated prior to induction Oxygen Delivery Method: Circle system utilized Preoxygenation: Pre-oxygenation with 100% oxygen Induction Type: IV induction Ventilation: Mask ventilation without difficulty Laryngoscope Size: Mac and 4 Grade View: Grade I Tube type: Oral Tube size: 7.5 mm Number of attempts: 1 Airway Equipment and Method: Stylet and Oral airway Placement Confirmation: ETT inserted through vocal cords under direct vision,  positive ETCO2 and breath sounds checked- equal and bilateral Secured at: 22 cm Tube secured with: Tape Dental Injury: Teeth and Oropharynx as per pre-operative assessment

## 2018-04-16 NOTE — Anesthesia Postprocedure Evaluation (Signed)
Anesthesia Post Note  Patient: Sandra Ray  Procedure(s) Performed: XI ROBOTIC ASSISTED  LEFT SALPINGO OOPHORECTOMY (Left )     Patient location during evaluation: PACU Anesthesia Type: General Level of consciousness: awake and alert Pain management: pain level controlled Vital Signs Assessment: post-procedure vital signs reviewed and stable Respiratory status: spontaneous breathing, nonlabored ventilation, respiratory function stable and patient connected to nasal cannula oxygen Cardiovascular status: blood pressure returned to baseline and stable Postop Assessment: no apparent nausea or vomiting Anesthetic complications: no    Last Vitals:  Vitals:   04/16/18 0949 04/16/18 1015  BP:  128/63  Pulse: 71   Resp: 15   Temp:    SpO2: 100% 99%    Last Pain:  Vitals:   04/16/18 1000  TempSrc:   PainSc: 0-No pain                 Kyrstal Monterrosa S

## 2018-04-16 NOTE — Discharge Instructions (Signed)
04/16/2018  Return to work: Discuss with Dr. Gerarda Fraction  Activity: 1. Be up and out of the bed during the day.  Take a nap if needed.  You may walk up steps but be careful and use the hand rail.  Stair climbing will tire you more than you think, you may need to stop part way and rest.   2. No lifting, pulling, pushing, or straining anything more than 5 pounds for 4 weeks. You may be able to return to full activity at 4 weeks, but this will be further discussed at your postoperative visits.  3. No driving for 2 weeks.  Do Not drive if you are taking narcotic pain medicine.  4. Shower daily.  Use soap and water on your incision and pat dry; don't rub.   5. No sexual activity and nothing in the vagina for 4 weeks.  Medications:  - As long as you have never been told to avoid ibuprofen and/or tylenol use these first for pain control. Take these regularly (every 6 hours) to decrease the build up of pain.  - If necessary, for severe pain not relieved by the above, take your pain prescription.  - While taking your prescription pain medication you should take stool softener (I prefer you purchase docusate sodium "Colace") 2-3 times per day to reduce the likelihood of constipation. If this causes diarrhea, stop its use.  Diet: 1. Low sodium Heart Healthy Diet is recommended. 2. It is safe to use a gentle laxative if you have difficulty moving your bowels as long as you have no nausea or vomiting and you are passing flatus.  Wound Care: 1. Keep clean and dry.  Shower daily. 2. If you have steri-strips on your incision these will fall off after 2-3 weeks. At 3 weeks you may take these off carefully after showering.   Reasons to call the Doctor:   Fever - Oral temperature greater than 100.4 degrees Fahrenheit  Foul-smelling vaginal discharge  Difficulty urinating  Nausea and vomiting  Increased pain at the site of the incision that is unrelieved with pain medicine.  Difficulty breathing  with or without chest pain  New calf pain especially if only on one side  Sudden, continuing increased vaginal bleeding/drainage with or without clots.   Follow-up: 1. See Dr. Gerarda Fraction in 1-2 weeks.  Contacts: For questions or concerns you should contact:  Our office [phone] After hours and on week-ends for urgent issues relating to our treatment for you, please call [phone] and ask to speak to the physician on call for Gynecologic Oncology  We will not refill pain medications if we are not your primary provider or after hours. Plan accordingly if you are running low.

## 2018-04-16 NOTE — Transfer of Care (Signed)
Immediate Anesthesia Transfer of Care Note  Patient: Sandra Ray  Procedure(s) Performed: XI ROBOTIC ASSISTED  LEFT SALPINGO OOPHORECTOMY (Left )  Patient Location: PACU  Anesthesia Type:General  Level of Consciousness: awake, alert  and oriented  Airway & Oxygen Therapy: Patient Spontanous Breathing and Patient connected to face mask oxygen  Post-op Assessment: Report given to RN and Post -op Vital signs reviewed and stable  Post vital signs: Reviewed and stable  Last Vitals:  Vitals Value Taken Time  BP    Temp    Pulse 71 04/16/2018  9:46 AM  Resp 9 04/16/2018  9:46 AM  SpO2 100 % 04/16/2018  9:46 AM  Vitals shown include unvalidated device data.  Last Pain:  Vitals:   04/16/18 0625  TempSrc:   PainSc: 0-No pain      Patients Stated Pain Goal: 4 (97/35/32 9924)  Complications: No apparent anesthesia complications

## 2018-04-16 NOTE — Op Note (Signed)
OPERATIVE NOTE  Date: 04/16/18  Preoperative Diagnosis: Adnexal mass   Postoperative Diagnosis:   Left adnexal mass Pelvic adhesive disease  Procedure(s) Performed:  Robotic-assisted laparoscopic left salpingo-oophorectomy Lysis of adhesions  ~1 hour Pelvic washings  Surgeon: Bernita Raisin, MD  Assistant Surgeon: Everitt Amber, M.D. (an MD assistant was necessary for tissue manipulation, management of robotic instrumentation, retraction and positioning due to the complexity of the case and hospital policies).   Anesthesia: GETA  Specimens: Left fallopian tube/ovary, pelvic washings  Complications: none  Indication for Procedure:  Patient found to have persistent complex left adnexal mass. NOTE history of abdominal hysterectomy and 2 C-Sections which likely explained pelvic adhesive disease.  Operative Findings: Adhesions of omentum to anterior abdominal wall and pelvic adhesive disease of small bowel and sigmoid colon mesentary. Overall lysis of adhesions required 1 hour of dissection. Otherwise no peritoneal disease, smooth liver surface, stomach wall, and diaphragms.  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  Procedure in Detail:  The patient was taken to the operating room and positioned supine with her arms at her sides prior to anesthesia to ensure comfort.  She was then placed under general endotracheal anesthesia without difficulty. She was placed in a dorsolithotomy position and cervical acromial pads were placed. The patient had sequential compression devices for VTE prophylaxis.  The patient was then prepped in the usual sterile fashion.  Time out was performed.   A foley was placed by me and a sponge on a stick was placed for vaginal manipulation.  A 6mm incision was made in the left upper quadrant just medial to palmer's point and a 5 mm Optiview trocar used to enter the abdomen under direct visualization. With entry into the abdomen  findings as noted. Then with maintenance of 15 mm of mercury the patient was placed in Trendelenburg position. The trocar sites were laid out. The lateral trocars were placed first. An 15mm trocar was also placed in the left and then right lateral abdominal wall under direct visualization. 8 mm robotic trochars were used. Adhesiolysis of the omentum from the anterior abdominal wall was performed (~15 minutes) with traditional laparoscopic approach. With the midline omental adhesions taken down, the superior umbilical incision was made and an 52mm trocar was inserted here. The 35mm LUQ trocar was changed out to 32mm airseal. The robot was docked.  The abdomen was inspected as was the pelvis.  Findings as noted. Additional adhesiolysis in the pelvis of the small bowel and the sigmoid colon/mesentary (~45 minutes). Pelvic washings were obtained. The mass/cyst was noted on the left adnexa. An incision was made on the left pelvic side wall peritoneum parallel to the IP ligament and the retroperitoneal space entered. The left ureter was identified and the para-rectal space was developed. A window was created in the broad ligament above the ureter. The infundibulopelvic vessels were skeletonized cauterized and transected. The broad ligament was followed dow to the adnexa. There were adhesions of the left adnexa to the sigmoid mesentary that required additional lysis of adhesions to finally free the mass/ovary on all sides. Careful sharp and blunt dissection was used. Once freed, the specimen was placed in an Endo Catch bag.  The contralateral adnexa was examined and no ovary was present.   The operative sites were inspected and hemostasis was noted.   The robot was undocked. The bag holding the left tube/ovary was brought to the LUQ trocar site and the bag opened. The cyst was visualized and aspirated noting clear  fluid. The mass was then morcellated to facilitate removal from the abdominal cavity. This required  placement in a second bag due to the first bag tearing during morcellation.   Frozen section returned with benign findings.  The ports were all removed. The fascial closure at the left upper quadrant port was made with 0 Vicryl.  All incisions were closed with interrupted 4-0 vicryl and running subcuticular 4-0 Monocryl suture. Dermabond was applied.  Sponge, lap and needle counts were correct x 2.          Disposition: PACU         Condition: stable

## 2018-04-16 NOTE — Interval H&P Note (Signed)
History and Physical Interval Note:  04/16/2018 6:56 AM  Sandra Ray  has presented today for surgery, with the diagnosis of OVARIAN  CYST  The various methods of treatment have been discussed with the patient and family. After consideration of risks, benefits and other options for treatment, the patient has consented to  Procedure(s): XI ROBOTIC ASSISTED  BILAERAL SALPINGO OOPHORECTOMY POSSIBLE STAGING (Bilateral) as a surgical intervention .  The patient's history has been reviewed, patient examined, no change in status, stable for surgery.  I have reviewed the patient's chart and labs.  Questions were answered to the patient's satisfaction.   Aware of and consents to possible USO and possible laparotomy  [name]

## 2018-04-16 NOTE — Telephone Encounter (Signed)
Called and spoke with the patient regarding rescheduling her appt on 2/17. Appt needs to be moved up to 2/12. Patient stated that she will call the office back tomorrow to reschedule.

## 2018-04-17 ENCOUNTER — Encounter (HOSPITAL_COMMUNITY): Payer: Self-pay | Admitting: Obstetrics

## 2018-04-17 LAB — URINE CULTURE: Culture: NO GROWTH

## 2018-04-17 LAB — TYPE AND SCREEN
ABO/RH(D): O NEG
Antibody Screen: POSITIVE
Unit division: 0
Unit division: 0

## 2018-04-17 LAB — BPAM RBC
Blood Product Expiration Date: 202003072359
Blood Product Expiration Date: 202003072359
Unit Type and Rh: 9500
Unit Type and Rh: 9500

## 2018-04-18 ENCOUNTER — Telehealth: Payer: Self-pay

## 2018-04-18 NOTE — Telephone Encounter (Signed)
Outgoing call to patient per Joylene John NP regarding surgical path as "benign path"  And "how is she doing post-op?" Pt voiced understanding and reports she is doing well, walking around home, no pain, just sore, eating, drinking, voiding, and had a bm - denies any issues.  Reminded of upcoming appt on 2/12.  No further needs per pt at this time.

## 2018-04-24 ENCOUNTER — Encounter: Payer: Self-pay | Admitting: Obstetrics

## 2018-04-24 ENCOUNTER — Inpatient Hospital Stay: Payer: Medicare Other | Attending: Obstetrics | Admitting: Obstetrics

## 2018-04-24 VITALS — BP 127/57 | HR 88 | Temp 97.5°F | Resp 20 | Ht 68.5 in | Wt 178.0 lb

## 2018-04-24 DIAGNOSIS — D271 Benign neoplasm of left ovary: Secondary | ICD-10-CM

## 2018-04-24 DIAGNOSIS — N83209 Unspecified ovarian cyst, unspecified side: Secondary | ICD-10-CM

## 2018-04-24 DIAGNOSIS — Z90721 Acquired absence of ovaries, unilateral: Secondary | ICD-10-CM

## 2018-04-24 NOTE — Patient Instructions (Signed)
Continue activity restrictions as instructed Call for return visit if any concerns over the next month. Keep followup with Dr. Ronita Hipps and your primary care doctor Watch for problems with your hernia

## 2018-04-24 NOTE — Progress Notes (Signed)
S: Postop from [facility] laparoscopic LSO and extensive lysis of adhesions 04/16/18. Pathology c/w benign findings. Has some fatigue. Denies N/V, fever, pain.  O:  Vitals:   04/24/18 1333  BP: (!) 127/57  Pulse: 88  Resp: 20  Temp: (!) 97.5 F (36.4 C)  SpO2: 99%   Gen NAD Pulm nonlabored Abdo wounds CDI. Minor ecchymosis at each incision. + known umbilical hernia. Soft, NTND Ext no edema  A/P Postop  Pathology and operative reports reviewed and she was given a copy Activity restrictions reviewed Open followup  She should call for visit if any issues over the next month. Hernia and bowel injury precautions given.  Cc: Brien Few, MD (Ref Ob/Gyn) Nche, Charlene Brooke, NP (PCP)

## 2018-04-29 ENCOUNTER — Ambulatory Visit: Payer: Medicare Other | Admitting: Obstetrics

## 2018-05-12 ENCOUNTER — Encounter (HOSPITAL_COMMUNITY): Payer: Self-pay | Admitting: Emergency Medicine

## 2018-05-12 ENCOUNTER — Emergency Department (HOSPITAL_COMMUNITY)
Admission: EM | Admit: 2018-05-12 | Discharge: 2018-05-12 | Disposition: A | Discharge status: Home / Self Care | Payer: Medicare Other | Attending: Emergency Medicine | Admitting: Emergency Medicine

## 2018-05-12 ENCOUNTER — Other Ambulatory Visit: Payer: Self-pay

## 2018-05-12 ENCOUNTER — Emergency Department (HOSPITAL_COMMUNITY): Payer: Medicare Other

## 2018-05-12 ENCOUNTER — Other Ambulatory Visit: Payer: Self-pay | Admitting: Endocrinology

## 2018-05-12 DIAGNOSIS — Y92000 Kitchen of unspecified non-institutional (private) residence as  the place of occurrence of the external cause: Secondary | ICD-10-CM | POA: Diagnosis not present

## 2018-05-12 DIAGNOSIS — E119 Type 2 diabetes mellitus without complications: Secondary | ICD-10-CM | POA: Diagnosis not present

## 2018-05-12 DIAGNOSIS — I10 Essential (primary) hypertension: Secondary | ICD-10-CM | POA: Insufficient documentation

## 2018-05-12 DIAGNOSIS — S99922A Unspecified injury of left foot, initial encounter: Secondary | ICD-10-CM | POA: Diagnosis present

## 2018-05-12 DIAGNOSIS — Y998 Other external cause status: Secondary | ICD-10-CM | POA: Diagnosis not present

## 2018-05-12 DIAGNOSIS — Z7984 Long term (current) use of oral hypoglycemic drugs: Secondary | ICD-10-CM | POA: Diagnosis not present

## 2018-05-12 DIAGNOSIS — E1165 Type 2 diabetes mellitus with hyperglycemia: Secondary | ICD-10-CM

## 2018-05-12 DIAGNOSIS — X501XXA Overexertion from prolonged static or awkward postures, initial encounter: Secondary | ICD-10-CM | POA: Insufficient documentation

## 2018-05-12 DIAGNOSIS — Z79899 Other long term (current) drug therapy: Secondary | ICD-10-CM | POA: Diagnosis not present

## 2018-05-12 DIAGNOSIS — S93602A Unspecified sprain of left foot, initial encounter: Secondary | ICD-10-CM | POA: Diagnosis not present

## 2018-05-12 DIAGNOSIS — Y9389 Activity, other specified: Secondary | ICD-10-CM | POA: Diagnosis not present

## 2018-05-12 MED ORDER — TRAMADOL HCL 50 MG PO TABS: 50.0000 mg | ORAL_TABLET | Freq: Four times a day (QID) | ORAL | 0 refills | Status: DC | PRN

## 2018-05-12 NOTE — ED Provider Notes (Signed)
[facility] DEPT Provider Note   CSN: [id number] Arrival date & time: 05/12/18  1611    History   Chief Complaint Chief Complaint  Patient presents with  . Foot Injury    HPI Sandra Ray is a 73 y.o. female.     The history is provided by the patient. No language interpreter was used.  Foot Injury  Associated symptoms: no fever      73 year old female with history of diabetes, hypertension who presented for evaluation of foot injury.  Patient report yesterday she dropped a mayo lid on the ground, when she reached down to pick up the lid she twisted her left foot awkwardly.  She felt some initial pain at that appointment but today she noticed increasing pain to her left foot and also noticed swelling and redness to the affected area.  Pain is throbbing, sharp, 2 out of 10 at rest, and moderate in severity with ambulation.  No pain in her ankle or knee.  No specific treatment tried.  She has been able to ambulate.  Past Medical History:  Diagnosis Date  . Arthritis   . Breast abscess 2000   right breast  . Carpal tunnel syndrome   . Complication of anesthesia   . Degenerative disc disease   . Diabetes mellitus type II   . Eczema of hand   . Hx of colonic polyps 12/02/2014   2004 - 2 diminutive polyps   . Hyperlipidemia   . Hypertension   . Metatarsal deformity   . Ovarian cyst 07/2017   left   . PONV (postoperative nausea and vomiting)   . Shingles 2000  . Tubular adenoma of colon     Patient Active Problem List   Diagnosis Date Noted  . Left hip pain 10/01/2017  . Left ovarian cyst 08/09/2017  . Hyperlipidemia LDL goal <70 11/21/2015  . Personal history of failed moderate sedation 12/02/2014  . Hx of colonic polyps 12/02/2014  . Intestinal adhesions 12/02/2014  . Metatarsal deformity 01/07/2014  . Hammer toe of right foot 01/07/2014  . Equinus deformity of foot, acquired 12/08/2013  . Metatarsalgia of right foot 12/08/2013  .  Ingrown nail 12/08/2013  . Pain in lower limb 12/08/2013  . Pain in finger of right hand 09/22/2013  . Shingles 04/15/2013  . Abdominal wall abscess, right upper quadrant 04/07/2013  . S/P breast biopsy, left 10/24/2012  . Hand eczema 03/20/2012  . Muscle spasm 09/18/2011  . General medical examination 06/19/2011  . Carpal tunnel syndrome 10/20/2010  . NEVI, MULTIPLE 03/28/2010  . MENOPAUSE, SURGICAL 02/15/2009  . AUTONOMIC NEUROPATHY, DIABETIC 06/10/2008  . HYPOKALEMIA 06/10/2008  . Acute non-recurrent maxillary sinusitis 02/21/2008  . DM neuropathy, type II diabetes mellitus ([place]) 01/31/2008  . Essential hypertension 01/31/2008  . DEGENERATIVE JOINT DISEASE, SPINE 01/31/2008  . LAMINECTOMY, LUMBAR, HX OF 01/31/2008    Past Surgical History:  Procedure Laterality Date  . ABDOMINAL HYSTERECTOMY  1981   menorrhagia  . ABSCESS DRAINAGE Right    breast  . APPENDECTOMY  1966   done with CSxn. Prophylactic  . BREAST BIOPSY Left   . carpel tunnel Right   . CESAREAN SECTION     x2  . COLONOSCOPY    . LUMBAR LAMINECTOMY     [id number]. 2011 L4,L5,S1  . Right rotator cuff repair 09/2015    . ROBOTIC ASSISTED SALPINGO OOPHERECTOMY Left 04/16/2018   Procedure: XI ROBOTIC ASSISTED  LEFT SALPINGO OOPHORECTOMY;  Surgeon: [name], MD;  Location: [facility] ORS;  Service: Gynecology;  Laterality: Left;  . TUBAL LIGATION       OB History    Gravida  2   Para  2   Term  2   Preterm      AB      Living  2     SAB      TAB      Ectopic      Multiple      Live Births  2            Home Medications    Prior to Admission medications   Medication Sig Start Date End Date Taking? Authorizing Provider  Acetaminophen (TYLENOL 8 HOUR PO) Take 650 mg by mouth.     [provider]  albuterol (VENTOLIN HFA) 108 (90 BASE) MCG/ACT inhaler Inhale 2 puffs into the lungs every 6 (six) hours as needed for wheezing or shortness of breath.     [provider]   atorvastatin (LIPITOR) 40 MG tablet Take 1 tablet (40 mg total) by mouth daily at 6 PM. 04/04/18   Nche, Charlene Brooke, NP  Cholecalciferol (VITAMIN D-3) 5000 UNITS TABS Take 5,000 Units by mouth daily.     [provider]  Coenzyme Q10 400 MG CAPS Take 400 mg by mouth daily.     [provider]  cyanocobalamin 500 MCG tablet Take 500 mcg by mouth daily.    [provider]  Dapagliflozin-metFORMIN HCl ER (XIGDUO XR) 07-998 MG TB24 Take 1 tablet by mouth 2 (two) times daily. Take 1 tablet by mouth twice daily. 03/19/18   Elayne Snare, MD  Dulaglutide (TRULICITY) 4.97 WY/6.3ZC SOPN Inject 0.75 mg into the skin once a week. 02/22/18   Elayne Snare, MD  fluticasone (FLONASE) 50 MCG/ACT nasal spray Place 2 sprays into both nostrils daily. Patient taking differently: Place 2 sprays into both nostrils daily as needed for allergies.  03/30/17   Nche, Charlene Brooke, NP  gabapentin (NEURONTIN) 300 MG capsule Take 1 capsule (300 mg total) by mouth 3 (three) times daily. 04/04/18   Nche, Charlene Brooke, NP  Garlic 5885 MG CAPS Take 1,000 mg by mouth daily.     [provider]  glipiZIDE (GLUCOTROL XL) 2.5 MG 24 hr tablet Take 1 tablet (2.5 mg total) by mouth daily with breakfast. 03/27/18   Elayne Snare, MD  glucose blood (ONE TOUCH ULTRA TEST) test strip Use as instructed once a day.  DX code E11.40 03/21/18   Elayne Snare, MD  hydrochlorothiazide (HYDRODIURIL) 25 MG tablet Take 1 tablet (25 mg total) by mouth daily. Patient taking differently: Take 25 mg by mouth at bedtime.  04/04/18   Nche, Charlene Brooke, NP  HYDROcodone-acetaminophen (NORCO/VICODIN) 5-325 MG tablet Take 1 tablet by mouth every 6 (six) hours as needed for moderate pain (Use with caution if combining with Tylenol.). 04/16/18   [name], MD  Lancets [facility] ULTRASOFT) lancets Use as instructed once day.  E11.40 05/18/16   Ann Held, DO  lisinopril (PRINIVIL,ZESTRIL) 5 MG tablet Take 1 tablet (5 mg  total) by mouth daily. Patient taking differently: Take 5 mg by mouth at bedtime.  11/06/17   Nche, Charlene Brooke, NP  ondansetron (ZOFRAN) 4 MG tablet Take 1 tablet (4 mg total) by mouth as needed for nausea or vomiting. 03/19/17   Elayne Snare, MD  potassium chloride SA (K-DUR,KLOR-CON) 20 MEQ tablet Take 1 tablet (20 mEq total) by mouth daily. 04/04/18  Nche, Charlene Brooke, NP    Family History Family History  Problem Relation Age of Onset  . Hypertension Father   . Diabetes Mother   . Stroke Mother   . Hypertension Mother   . High Cholesterol Mother   . Heart disease Mother   . Diabetes Brother   . Breast cancer Daughter 109       unsure if did genetic testing  . Breast cancer Maternal Aunt   . Breast cancer Sister 56  . Colon polyps Brother     Social History Social History   Tobacco Use  . Smoking status: Never Smoker  . Smokeless tobacco: Never Used  Substance Use Topics  . Alcohol use: No  . Drug use: No     Allergies   Penicillins; Amoxicillin; and Erythromycin   Review of Systems Review of Systems  Constitutional: Negative for fever.  Musculoskeletal: Positive for arthralgias.  Skin: Negative for wound.  Neurological: Negative for numbness.     Physical Exam Updated Vital Signs BP (!) 161/75 (BP Location: Left Arm)   Pulse (!) 106   Temp 98 F (36.7 C) (Oral)   Resp 16   SpO2 97%   Physical Exam Vitals signs and nursing note reviewed.  Constitutional:      General: She is not in acute distress.    Appearance: She is well-developed.  HENT:     Head: Atraumatic.  Eyes:     Conjunctiva/sclera: Conjunctivae normal.  Neck:     Musculoskeletal: Neck supple.  Musculoskeletal:        General: Tenderness (Left foot: Moderate erythema and edema noted to medial aspects of the foot most significant to the first MTP without any deformity.  Dorsalis pedis pulse palpable.  Ankle nontender) present.  Skin:    Findings: No rash.  Neurological:     Mental  Status: She is alert.      ED Treatments / Results  Labs (all labs ordered are listed, but only abnormal results are displayed) Labs Reviewed - No data to display  EKG None  Radiology Dg Foot Complete Left  Result Date: 05/12/2018 CLINICAL DATA:  Swelling and redness in left foot. Patient fell yesterday. EXAM: LEFT FOOT - COMPLETE 3+ VIEW COMPARISON:  None. FINDINGS: Vascular calcifications. Scattered degenerative changes including at the base of the first metatarsal. No acute fracture noted. No dislocations. No other acute abnormalities. Pes planus deformity. IMPRESSION: No acute abnormality noted.  No acute fracture seen. Electronically Signed   By: Dorise Bullion III M.D   On: 05/12/2018 18:03    Procedures Procedures (including critical care time)  Medications Ordered in ED Medications - No data to display   Initial Impression / Assessment and Plan / ED Course  I have reviewed the triage vital signs and the nursing notes.  Pertinent labs & imaging results that were available during my care of the patient were reviewed by me and considered in my medical decision making (see chart for details).        BP (!) 161/75 (BP Location: Left Arm)   Pulse (!) 106   Temp 98 F (36.7 C) (Oral)   Resp 16   SpO2 97%    Final Clinical Impressions(s) / ED Diagnoses   Final diagnoses:  Foot sprain, left, initial encounter    ED Discharge Orders         Ordered    traMADol (ULTRAM) 50 MG tablet  Every 6 hours PRN     05/12/18 1904  5:40 PM Pt injured her L foot yesterday when reaching down to pick up a mayo lid.  Swelling and tenderness noted to L medial foot.  Xray ordered.  Pain medication given, pt declined.  No skin laceration.   6:59 PM Xry of L foot without acute abnormality.  Suspect foot sprain causing her sxs.  ACE wrap applied.  Pt does have a podiatrist to f/u.     Domenic Moras, PA-C 05/12/18 Drema Halon    Julianne Rice, MD 05/13/18 [phone]

## 2018-05-12 NOTE — ED Triage Notes (Signed)
Pt reports that fell over picking up mayo lid yesterday. Reports today after church noticed some swelling and redness to left foot. Pt reports very painful with touch and weight bearing.

## 2018-05-14 ENCOUNTER — Other Ambulatory Visit: Payer: Self-pay

## 2018-05-14 ENCOUNTER — Other Ambulatory Visit (INDEPENDENT_AMBULATORY_CARE_PROVIDER_SITE_OTHER): Payer: Medicare Other

## 2018-05-14 DIAGNOSIS — E1165 Type 2 diabetes mellitus with hyperglycemia: Secondary | ICD-10-CM | POA: Diagnosis not present

## 2018-05-14 LAB — COMPREHENSIVE METABOLIC PANEL
ALT: 28 U/L (ref 0–35)
AST: 23 U/L (ref 0–37)
Albumin: 4.5 g/dL (ref 3.5–5.2)
Alkaline Phosphatase: 51 U/L (ref 39–117)
BILIRUBIN TOTAL: 0.5 mg/dL (ref 0.2–1.2)
BUN: 30 mg/dL — ABNORMAL HIGH (ref 6–23)
CO2: 32 meq/L (ref 19–32)
Calcium: 10.7 mg/dL — ABNORMAL HIGH (ref 8.4–10.5)
Chloride: 97 mEq/L (ref 96–112)
Creatinine, Ser: 0.63 mg/dL (ref 0.40–1.20)
GFR: 92.68 mL/min (ref >60.00)
GLUCOSE: 142 mg/dL — AB (ref 70–99)
Potassium: 4.1 mEq/L (ref 3.5–5.1)
Sodium: 139 mEq/L (ref 135–145)
Total Protein: 7 g/dL (ref 6.0–8.3)

## 2018-05-15 LAB — FRUCTOSAMINE: Fructosamine: 263 umol/L (ref 0–285)

## 2018-05-16 ENCOUNTER — Ambulatory Visit: Payer: Medicare Other | Admitting: Endocrinology

## 2018-05-16 ENCOUNTER — Encounter: Payer: Self-pay | Admitting: Endocrinology

## 2018-05-16 VITALS — BP 130/70 | HR 86 | Ht 68.5 in | Wt 181.0 lb

## 2018-05-16 DIAGNOSIS — E1165 Type 2 diabetes mellitus with hyperglycemia: Secondary | ICD-10-CM

## 2018-05-16 DIAGNOSIS — E785 Hyperlipidemia, unspecified: Secondary | ICD-10-CM

## 2018-05-16 DIAGNOSIS — E1142 Type 2 diabetes mellitus with diabetic polyneuropathy: Secondary | ICD-10-CM

## 2018-05-16 MED ORDER — OMEGA-3-ACID ETHYL ESTERS 1 G PO CAPS: 1.0000 g | ORAL_CAPSULE | Freq: Two times a day (BID) | ORAL | 5 refills | Status: DC

## 2018-05-16 NOTE — Progress Notes (Signed)
Patient ID: Sandra Ray, female   DOB: [birth year], 73 y.o.   MRN: [id number]            Reason for Appointment: Follow-up for Type 2 Diabetes   History of Present Illness:          Date of diagnosis of type 2 diabetes mellitus: 1996       Background history:   She had a high blood sugar on lab work which diagnosed her diabetes and she was most likely treated with metformin for a few years She has also taking glipizide for several years She was tried on Actos which was started because of significant weight gain. Over the last few years she has been taking either  Januvia or Tradjenta with her metformin in combination with fair control Her A1c has been below 8% only twice in the last 2-3 years  Recent history:   Her A1c was 8.1 is now 7.5 as of 1/20 Fructosamine is 263  Non-insulin hypoglycemic drugs: Glipizide ER 2.5 mg, Trulicity  1.5 mg weekly Xigduo 07/998, 2 tablets daily  Current management, blood sugar patterns and problems identified:  She has been able to tolerate the higher dose of Trulicity 5 mg weekly her last visit in December  She only has mild diarrhea the first day of the injection  Although she has not been able to be active she has a 3 pound since her last visit  She thinks her portions are usually well controlled  As before she is checking blood sugars mostly in the mornings and these are variably high  Lab glucose was 142 fasting        Side effects from medications have been: Weight gain from Actos, nausea from high dose metformin   Glucose monitoring:  done <1 times a day         Glucometer: One Touch.       Blood Glucose readings by time of day and averages from meter    PRE-MEAL Fasting Lunch Dinner  overnight Overall  Glucose range:  96-149  154, 164   94   Mean/median:  131    134   POST-MEAL PC Breakfast PC Lunch PC Dinner  Glucose range:   141  93  Mean/median:        Self-care: The diet that the patient has been following is: tries  to limit fried food.     Typical meal intake: Breakfast is cereal, egg, almond milk,  lunch [place] burger and salad, dinner is soup and crackers and vegetables.  Snacks are usually small apple                Dietician visit, most recent: 07/13/16               Exercise:  Minimal  Weight history: Previous range 200-254  Wt Readings from Last 3 Encounters:  05/16/18 181 lb (82.1 kg)  04/24/18 178 lb (80.7 kg)  04/16/18 178 lb 2 oz (80.8 kg)    Glycemic control:   Lab Results  Component Value Date   HGBA1C 7.5 (H) 04/12/2018   HGBA1C 8.1 (H) 02/06/2018   HGBA1C 7.6 (H) 08/24/2017   Lab Results  Component Value Date   MICROALBUR 0.9 08/24/2017   LDLCALC 47 04/03/2018   CREATININE 0.63 05/14/2018   Lab Results  Component Value Date   MICRALBCREAT 1.2 08/24/2017    Lab Results  Component Value Date   FRUCTOSAMINE 263 05/14/2018   FRUCTOSAMINE 328 (H) 07/07/2016  Allergies as of 05/16/2018      Reactions   Penicillins    Causes yeast infections Did it involve swelling of the face/tongue/throat, SOB, or low BP? No Did it involve sudden or severe rash/hives, skin peeling, or any reaction on the inside of your mouth or nose? No Did you need to seek medical attention at a hospital or doctor's office? No When did it last happen?2018 If all above answers are "NO", may proceed with cephalosporin use.   Amoxicillin Other (See Comments)   Pt states it causes her a yeast infection  Causes yeast infections Did it involve swelling of the face/tongue/throat, SOB, or low BP? No Did it involve sudden or severe rash/hives, skin peeling, or any reaction on the inside of your mouth or nose? No Did you need to seek medical attention at a hospital or doctor's office? No When did it last happen?2018 If all above answers are "NO", may proceed with cephalosporin use.   Erythromycin    Passed out while taking      Medication List       Accurate as of May 16, 2018  3:09  PM. Always use your most recent med list.        acetaminophen 650 MG CR tablet Commonly known as:  TYLENOL Take 650 mg by mouth every 8 (eight) hours as needed for pain.   atorvastatin 40 MG tablet Commonly known as:  LIPITOR Take 1 tablet (40 mg total) by mouth daily at 6 PM.   Coenzyme Q10 400 MG Caps Take 400 mg by mouth daily.   Dapagliflozin-metFORMIN HCl ER 07-998 MG Tb24 Commonly known as:  XIGDUO XR Take 1 tablet by mouth 2 (two) times daily. Take 1 tablet by mouth twice daily.   Dulaglutide 0.75 MG/0.5ML Sopn Commonly known as:  TRULICITY Inject 5.28 mg into the skin once a week.   fluticasone 50 MCG/ACT nasal spray Commonly known as:  FLONASE Place 2 sprays into both nostrils daily.   gabapentin 300 MG capsule Commonly known as:  NEURONTIN Take 1 capsule (300 mg total) by mouth 3 (three) times daily.   Garlic 4132 MG Caps Take 1,000 mg by mouth daily.   glipiZIDE 2.5 MG 24 hr tablet Commonly known as:  GLUCOTROL XL Take 1 tablet (2.5 mg total) by mouth daily with breakfast.   glucose blood test strip Commonly known as:  ONE TOUCH ULTRA TEST Use as instructed once a day.  DX code E11.40   hydrochlorothiazide 25 MG tablet Commonly known as:  HYDRODIURIL Take 1 tablet (25 mg total) by mouth daily.   HYDROcodone-acetaminophen 5-325 MG tablet Commonly known as:  NORCO/VICODIN Take 1 tablet by mouth every 6 (six) hours as needed for moderate pain (Use with caution if combining with Tylenol.).   lisinopril 5 MG tablet Commonly known as:  PRINIVIL,ZESTRIL Take 1 tablet (5 mg total) by mouth daily.   ondansetron 4 MG tablet Commonly known as:  Zofran Take 1 tablet (4 mg total) by mouth as needed for nausea or vomiting.   onetouch ultrasoft lancets Use as instructed once day.  E11.40   potassium chloride SA 20 MEQ tablet Commonly known as:  K-DUR,KLOR-CON Take 1 tablet (20 mEq total) by mouth daily.   traMADol 50 MG tablet Commonly known as:   ULTRAM Take 1 tablet (50 mg total) by mouth every 6 (six) hours as needed.   Ventolin HFA 108 (90 Base) MCG/ACT inhaler Generic drug:  albuterol Inhale 2 puffs into the lungs  every 6 (six) hours as needed for wheezing or shortness of breath.   vitamin B-12 500 MCG tablet Commonly known as:  CYANOCOBALAMIN Take 500 mcg by mouth daily.   Vitamin D-3 125 MCG (5000 UT) Tabs Take 5,000 Units by mouth daily.       Allergies:  Allergies  Allergen Reactions  . Penicillins     Causes yeast infections Did it involve swelling of the face/tongue/throat, SOB, or low BP? No Did it involve sudden or severe rash/hives, skin peeling, or any reaction on the inside of your mouth or nose? No Did you need to seek medical attention at a hospital or doctor's office? No When did it last happen?2018 If all above answers are "NO", may proceed with cephalosporin use.   Marland Kitchen Amoxicillin Other (See Comments)    Pt states it causes her a yeast infection  Causes yeast infections Did it involve swelling of the face/tongue/throat, SOB, or low BP? No Did it involve sudden or severe rash/hives, skin peeling, or any reaction on the inside of your mouth or nose? No Did you need to seek medical attention at a hospital or doctor's office? No When did it last happen?2018 If all above answers are "NO", may proceed with cephalosporin use.  . Erythromycin     Passed out while taking    Past Medical History:  Diagnosis Date  . Arthritis   . Breast abscess 2000   right breast  . Carpal tunnel syndrome   . Complication of anesthesia   . Degenerative disc disease   . Diabetes mellitus type II   . Eczema of hand   . Hx of colonic polyps 12/02/2014   2004 - 2 diminutive polyps   . Hyperlipidemia   . Hypertension   . Metatarsal deformity   . Ovarian cyst 07/2017   left   . PONV (postoperative nausea and vomiting)   . Shingles 2000  . Tubular adenoma of colon     Past Surgical History:  Procedure  Laterality Date  . ABDOMINAL HYSTERECTOMY  1981   menorrhagia  . ABSCESS DRAINAGE Right    breast  . APPENDECTOMY  1966   done with CSxn. Prophylactic  . BREAST BIOPSY Left   . carpel tunnel Right   . CESAREAN SECTION     x2  . COLONOSCOPY    . LUMBAR LAMINECTOMY     [id number]. 2011 L4,L5,S1  . Right rotator cuff repair 09/2015    . ROBOTIC ASSISTED SALPINGO OOPHERECTOMY Left 04/16/2018   Procedure: XI ROBOTIC ASSISTED  LEFT SALPINGO OOPHORECTOMY;  Surgeon: [name], MD;  Location: [facility];  Service: Gynecology;  Laterality: Left;  . TUBAL LIGATION      Family History  Problem Relation Age of Onset  . Hypertension Father   . Diabetes Mother   . Stroke Mother   . Hypertension Mother   . High Cholesterol Mother   . Heart disease Mother   . Diabetes Brother   . Breast cancer Daughter 9       unsure if did genetic testing  . Breast cancer Maternal Aunt   . Breast cancer Sister 15  . Colon polyps Brother     Social History:  reports that she has never smoked. She has never used smokeless tobacco. She reports that she does not drink alcohol or use drugs.   Review of Systems   Lipid history: Has had high triglycerides and LDL, LDL below 100, recent triglycerides better  This is managed by  her PCP. She reports taking fish oil by prescription but this is not on her list, apparently taking 2 g daily   Lab Results  Component Value Date   CHOL 123 04/03/2018   HDL 43.40 04/03/2018   LDLCALC 47 04/03/2018   LDLDIRECT 80.0 02/06/2018   TRIG 164.0 (H) 04/03/2018   CHOLHDL 3 04/03/2018           Hypertension:Mild and controlled with 5 mg Lisinopril and 25 mg HCTZ prescribed by  PCP  BP Readings from Last 3 Encounters:  05/16/18 130/70  05/12/18 (!) 161/75  04/24/18 (!) 127/57    Most recent eye exam was 10/19  Most recent foot exam: 08/2017 Has evidence of neuropathy on exam   LABS:  Lab on 05/14/2018  Component Date Value Ref Range Status  . Sodium  05/14/2018 139  135 - 145 mEq/L Final  . Potassium 05/14/2018 4.1  3.5 - 5.1 mEq/L Final  . Chloride 05/14/2018 97  96 - 112 mEq/L Final  . CO2 05/14/2018 32  19 - 32 mEq/L Final  . Glucose, Bld 05/14/2018 142* 70 - 99 mg/dL Final  . BUN 05/14/2018 30* 6 - 23 mg/dL Final  . Creatinine, Ser 05/14/2018 0.63  0.40 - 1.20 mg/dL Final  . Total Bilirubin 05/14/2018 0.5  0.2 - 1.2 mg/dL Final  . Alkaline Phosphatase 05/14/2018 51  39 - 117 U/L Final  . AST 05/14/2018 23  0 - 37 U/L Final  . ALT 05/14/2018 28  0 - 35 U/L Final  . Total Protein 05/14/2018 7.0  6.0 - 8.3 g/dL Final  . Albumin 05/14/2018 4.5  3.5 - 5.2 g/dL Final  . Calcium 05/14/2018 10.7* 8.4 - 10.5 mg/dL Final  . GFR 05/14/2018 92.68  >60.00 mL/min Final  . Fructosamine 05/14/2018 263  0 - 285 umol/L Final   Comment: Published reference interval for apparently healthy subjects between age 87 and 63 is 63 - 285 umol/L and in a poorly controlled diabetic population is 228 - 563 umol/L with a mean of 396 umol/L.     Physical Examination:  BP 130/70 (BP Location: Left Arm, Patient Position: Sitting, Cuff Size: Normal)   Pulse 86   Ht 5' 8.5" (1.74 m)   Wt 181 lb (82.1 kg)   SpO2 96%   BMI 27.12 kg/m   ASSESSMENT:  Diabetes type 2, BMI 28  See history of present illness for detailed discussion of current diabetes management, blood sugar patterns and problems identified   Her A1c is improving now and down to 7.5 this was done right after visit in December  Fructosamine is 263 which indicates good control  Has been able to go up to 1.5 mg Trulicity since her last visit Although she is not able to exercise she is able to keep her weight down slightly better Not clear if she is having some high readings after meals but sometimes still has high readings fasting  HYPERTENSION: Controlled  Lipids: Triglycerides are improved and she can continue fish oil with her atorvastatin    PLAN:   Continue same medication  regimen We will try to get her higher dose of Trulicity through her patient assistance program More blood sugar readings after dinner She will let us know if blood sugars are not consistently controlled     There are no Patient Instructions on file for this visit.       Elayne Snare 05/16/2018, 3:09 PM   Note: This office note was prepared with Viviann Spare  voice recognition system technology. Any transcriptional errors that result from this process are unintentional.

## 2018-05-17 ENCOUNTER — Telehealth: Payer: Self-pay | Admitting: Endocrinology

## 2018-05-17 ENCOUNTER — Other Ambulatory Visit: Payer: Self-pay

## 2018-05-17 MED ORDER — ONDANSETRON HCL 4 MG PO TABS: 4.0000 mg | ORAL_TABLET | ORAL | 0 refills | Status: DC | PRN

## 2018-05-17 NOTE — Telephone Encounter (Signed)
rx sent

## 2018-05-17 NOTE — Telephone Encounter (Signed)
MEDICATION: ondansetron (ZOFRAN) 4 MG tablet  PHARMACY:  [facility] on [address]  IS THIS A 90 DAY SUPPLY : Would prefer  IS PATIENT OUT OF MEDICATION: Yes  IF NOT; HOW MUCH IS LEFT: 0  LAST APPOINTMENT DATE: @3 /07/2018  NEXT APPOINTMENT DATE:@6 /10/2018  DO WE HAVE YOUR PERMISSION TO LEAVE A DETAILED MESSAGE:Yes   OTHER COMMENTS: Also, The company that Dr. Dwyane Dee is sending a RX for Trulicity should be [employer] NOT [employer]     **Let patient know to contact pharmacy at the end of the day to make sure medication is ready. **  ** Please notify patient to allow 48-72 hours to process**  **Encourage patient to contact the pharmacy for refills or they can request refills through [facility]**

## 2018-06-17 ENCOUNTER — Telehealth: Payer: Self-pay | Admitting: Endocrinology

## 2018-06-17 ENCOUNTER — Other Ambulatory Visit: Payer: Self-pay

## 2018-06-17 MED ORDER — DULAGLUTIDE 1.5 MG/0.5ML ~~LOC~~ SOAJ: 1.5000 mg | SUBCUTANEOUS | 4 refills | Status: DC

## 2018-06-17 NOTE — Telephone Encounter (Signed)
[facility] Is needing a new prescription  Dulaglutide (TRULICITY) 3.14 HF/0.2OV SOPN  FAX- [place], [place] ([place])

## 2018-06-17 NOTE — Telephone Encounter (Signed)
Rx printed out for MD to sign.

## 2018-07-16 ENCOUNTER — Telehealth: Payer: Self-pay | Admitting: Nurse Practitioner

## 2018-07-16 DIAGNOSIS — I10 Essential (primary) hypertension: Secondary | ICD-10-CM

## 2018-07-16 DIAGNOSIS — E785 Hyperlipidemia, unspecified: Secondary | ICD-10-CM

## 2018-07-16 NOTE — Telephone Encounter (Signed)
I am skeptical if we will be seeing patients in office in 08/2018. I recommend maintian current appt and calling office again end of this month to see if anything has changed.  When is she getting lab with Dr. Dwyane Dee?

## 2018-07-16 NOTE — Telephone Encounter (Signed)
Pt was wondering if she and spouse can have a follow up in mid 08/2018 instead of 09/2018?--last ov was 03/2018--suppose to make 6 mo follow up. Please advise?   Pt also wondering if charlotte can add on what ever lab she needs to the day she going for Dr. Dwyane Dee lab work--so she can do it one time. Please advise.    Copied from [place] [phone]. Topic: General - Call Back - No Documentation >> Jul 16, 2018 12:36 PM Richardo Priest, NT wrote: Reason for CRM: Patient is calling in returning a call back from [place]. Call back number is [phone].

## 2018-07-17 NOTE — Telephone Encounter (Signed)
Pt is aware.  

## 2018-07-17 NOTE — Telephone Encounter (Signed)
Pt will get her lab done on 08/19/2018 at [facility].

## 2018-07-24 ENCOUNTER — Telehealth: Payer: Self-pay | Admitting: Endocrinology

## 2018-07-24 NOTE — Telephone Encounter (Signed)
Attempted to call twice and was on hold for extended periods of time.

## 2018-07-24 NOTE — Telephone Encounter (Signed)
[facility] called in regards to faxing patients paperwork on Trulicity. Requested a call back if we have received it.  Please Advise, Thanks  [facility] # [phone]

## 2018-08-09 NOTE — Telephone Encounter (Signed)
Sandra Ray from [facility] returning Sandra Ray call concerning the patients Trulicity-[name] stated a form was faxed yesterday which was a refill request form and she wanted to verify we have received it and states she will need it to be faxed back to her

## 2018-08-09 NOTE — Telephone Encounter (Signed)
Form was received for refill. It has been filled out and given to the doctor to sign. Once this is completed, it will be faxed back to [facility].

## 2018-08-19 ENCOUNTER — Other Ambulatory Visit: Payer: Self-pay

## 2018-08-19 ENCOUNTER — Telehealth: Payer: Self-pay | Admitting: Endocrinology

## 2018-08-19 ENCOUNTER — Other Ambulatory Visit (INDEPENDENT_AMBULATORY_CARE_PROVIDER_SITE_OTHER): Payer: Medicare Other

## 2018-08-19 DIAGNOSIS — E785 Hyperlipidemia, unspecified: Secondary | ICD-10-CM

## 2018-08-19 DIAGNOSIS — E1165 Type 2 diabetes mellitus with hyperglycemia: Secondary | ICD-10-CM

## 2018-08-19 LAB — BASIC METABOLIC PANEL
BUN: 17 mg/dL (ref 6–23)
CO2: 29 mEq/L (ref 19–32)
Calcium: 9.8 mg/dL (ref 8.4–10.5)
Chloride: 99 mEq/L (ref 96–112)
Creatinine, Ser: 0.56 mg/dL (ref 0.40–1.20)
GFR: 106.1 mL/min (ref >60.00)
Glucose, Bld: 153 mg/dL — ABNORMAL HIGH (ref 70–99)
Potassium: 4.1 mEq/L (ref 3.5–5.1)
Sodium: 138 mEq/L (ref 135–145)

## 2018-08-19 LAB — MICROALBUMIN / CREATININE URINE RATIO
Creatinine,U: 125.4 mg/dL
Microalb Creat Ratio: 1.1 mg/g (ref 0.0–30.0)
Microalb, Ur: 1.4 mg/dL (ref 0.0–1.9)

## 2018-08-19 LAB — HEMOGLOBIN A1C: Hgb A1c MFr Bld: 7.8 % — ABNORMAL HIGH (ref 4.6–6.5)

## 2018-08-19 NOTE — Telephone Encounter (Signed)
Patient agreed to do a phone visit but wanted to know what would be done for handicap sticker renewal.  Please Advise, Thanks

## 2018-08-19 NOTE — Telephone Encounter (Signed)
Paperwork can be completed for this.

## 2018-08-20 LAB — LIPID PANEL
Cholesterol: 137 mg/dL (ref 0–200)
HDL: 42.8 mg/dL (ref >39.00)
NonHDL: 94.25
Total CHOL/HDL Ratio: 3
Triglycerides: 222 mg/dL — ABNORMAL HIGH (ref 0.0–149.0)
VLDL: 44.4 mg/dL — ABNORMAL HIGH (ref 0.0–40.0)

## 2018-08-20 LAB — LDL CHOLESTEROL, DIRECT: Direct LDL: 69 mg/dL

## 2018-08-22 ENCOUNTER — Ambulatory Visit (INDEPENDENT_AMBULATORY_CARE_PROVIDER_SITE_OTHER): Payer: Medicare Other | Admitting: Endocrinology

## 2018-08-22 ENCOUNTER — Encounter: Payer: Self-pay | Admitting: Endocrinology

## 2018-08-22 ENCOUNTER — Other Ambulatory Visit: Payer: Self-pay

## 2018-08-22 DIAGNOSIS — E1165 Type 2 diabetes mellitus with hyperglycemia: Secondary | ICD-10-CM

## 2018-08-22 NOTE — Progress Notes (Signed)
Patient ID: Sandra Ray, female   DOB: 19-Dec-1945, 73 y.o.   MRN: [id number]            Reason for Appointment: Follow-up for Type 2 Diabetes  Today's office visit was provided via telemedicine using a telephone call to the patient Patient has been explained the limitations of evaluation and management by telemedicine and the availability of in person appointments.  The patient understood the limitations and agreed to proceed. Patient also understood that the telehealth visit is billable. . Location of the patient: Home . Location of the provider: Office Only the patient and myself were participating in the encounter   History of Present Illness:          Date of diagnosis of type 2 diabetes mellitus: 1996       Background history:   She had a high blood sugar on lab work which diagnosed her diabetes and she was most likely treated with metformin for a few years She has also taking glipizide for several years She was tried on Actos which was started because of significant weight gain. Over the last few years she has been taking either  Januvia or Tradjenta with her metformin in combination with fair control Her A1c has been below 8% only twice in the last 2-3 years  Recent history:   Her A1c back up at 7.8, previously was down to 7.5  Her last visit was in 05/2018   Non-insulin hypoglycemic drugs: Glipizide ER 2.5 mg, Trulicity  1.5 mg weekly Xigduo 07/998, 1 tablet twice daily  Current management, blood sugar patterns and problems identified:  She has been able to get her Trulicity from the patient assistance program and is continuing to take 1.5 mg weekly without side effect  She however thinks that she will get nausea if she tries to take Xigduo 2 tablets together  She takes this at lunch and bedtime now  Her blood sugar was not downloaded and average blood sugar is not available  However her fasting blood sugars are not high enough to explain her A1c of 7.8  However  her lab glucose appears to be about 20 mg higher than her usual fasting reading  She only rarely checks her readings after meals  Only once had a high reading of 195 yesterday late morning after a large snack  Because of her various physical problems she is not able to do any exercise or walking  She thinks that she has not been paying attention to her diet as much lately causing higher sugars  She is on low-dose glipizide and her lowest blood sugar recently is 125  Not clear if she has lost or gained any weight   Lab glucose was 153 fasting        Side effects from medications have been: Weight gain from Actos, nausea from high dose metformin   Glucose monitoring:  done <1 times a day         Glucometer: One Touch ultra 2.       Blood Glucose readings by time of day from patient reading off her meter values:   PRE-MEAL Fasting Lunch Dinner Bedtime Overall  Glucose range:  130-141 125, 127  142    Mean/median:     ?   POST-MEAL PC Breakfast PC Lunch PC Dinner  Glucose range:  195   161  Mean/median:       Previous readings:  PRE-MEAL Fasting Lunch Dinner  overnight Overall  Glucose range:  96-149  154, 164   94   Mean/median:  131    134   POST-MEAL PC Breakfast PC Lunch PC Dinner  Glucose range:   141  93  Mean/median:        Self-care: The diet that the patient has been following is: tries to limit fried food.     Typical meal intake: Breakfast is cereal, egg, almond milk,  lunch [place] burger and salad, dinner is soup and crackers and vegetables.  Snacks are usually small apple                Dietician visit, most recent: 07/13/16               Exercise:  Minimal  Weight history: Previous range 200-254  Wt Readings from Last 3 Encounters:  05/16/18 181 lb (82.1 kg)  04/24/18 178 lb (80.7 kg)  04/16/18 178 lb 2 oz (80.8 kg)    Glycemic control:   Lab Results  Component Value Date   HGBA1C 7.8 (H) 08/19/2018   HGBA1C 7.5 (H) 04/12/2018   HGBA1C 8.1  (H) 02/06/2018   Lab Results  Component Value Date   MICROALBUR 1.4 08/19/2018   LDLCALC 47 04/03/2018   CREATININE 0.56 08/19/2018   Lab Results  Component Value Date   MICRALBCREAT 1.1 08/19/2018    Lab Results  Component Value Date   FRUCTOSAMINE 263 05/14/2018   FRUCTOSAMINE 328 (H) 07/07/2016      Allergies as of 08/22/2018      Reactions   Penicillins    Causes yeast infections Did it involve swelling of the face/tongue/throat, SOB, or low BP? No Did it involve sudden or severe rash/hives, skin peeling, or any reaction on the inside of your mouth or nose? No Did you need to seek medical attention at a hospital or doctor's office? No When did it last happen?2018 If all above answers are "NO", may proceed with cephalosporin use.   Amoxicillin Other (See Comments)   Pt states it causes her a yeast infection  Causes yeast infections Did it involve swelling of the face/tongue/throat, SOB, or low BP? No Did it involve sudden or severe rash/hives, skin peeling, or any reaction on the inside of your mouth or nose? No Did you need to seek medical attention at a hospital or doctor's office? No When did it last happen?2018 If all above answers are "NO", may proceed with cephalosporin use.   Erythromycin    Passed out while taking      Medication List       Accurate as of August 22, 2018  9:06 PM. If you have any questions, ask your nurse or doctor.        acetaminophen 650 MG CR tablet Commonly known as: TYLENOL Take 650 mg by mouth every 8 (eight) hours as needed for pain.   atorvastatin 40 MG tablet Commonly known as: LIPITOR Take 1 tablet (40 mg total) by mouth daily at 6 PM.   Coenzyme Q10 400 MG Caps Take 400 mg by mouth daily.   Dapagliflozin-metFORMIN HCl ER 07-998 MG Tb24 Commonly known as: Xigduo XR Take 1 tablet by mouth 2 (two) times daily. Take 1 tablet by mouth twice daily.   Dulaglutide 1.5 MG/0.5ML Sopn Commonly known as: Trulicity  Inject 1.5 mg into the skin once a week.   fluticasone 50 MCG/ACT nasal spray Commonly known as: FLONASE Place 2 sprays into both nostrils daily. What changed:   when to take this  reasons  to take this   gabapentin 300 MG capsule Commonly known as: NEURONTIN Take 1 capsule (300 mg total) by mouth 3 (three) times daily.   Garlic 9702 MG Caps Take 1,000 mg by mouth daily.   glipiZIDE 2.5 MG 24 hr tablet Commonly known as: GLUCOTROL XL Take 1 tablet (2.5 mg total) by mouth daily with breakfast.   glucose blood test strip Commonly known as: ONE TOUCH ULTRA TEST Use as instructed once a day.  DX code E11.40   hydrochlorothiazide 25 MG tablet Commonly known as: HYDRODIURIL Take 1 tablet (25 mg total) by mouth daily. What changed: when to take this   HYDROcodone-acetaminophen 5-325 MG tablet Commonly known as: NORCO/VICODIN Take 1 tablet by mouth every 6 (six) hours as needed for moderate pain (Use with caution if combining with Tylenol.).   lisinopril 5 MG tablet Commonly known as: ZESTRIL Take 1 tablet (5 mg total) by mouth daily. What changed: when to take this   omega-3 acid ethyl esters 1 g capsule Commonly known as: LOVAZA Take 1 capsule (1 g total) by mouth 2 (two) times daily.   ondansetron 4 MG tablet Commonly known as: Zofran Take 1 tablet (4 mg total) by mouth as needed for nausea or vomiting.   onetouch ultrasoft lancets Use as instructed once day.  E11.40   potassium chloride SA 20 MEQ tablet Commonly known as: K-DUR Take 1 tablet (20 mEq total) by mouth daily.   traMADol 50 MG tablet Commonly known as: ULTRAM Take 1 tablet (50 mg total) by mouth every 6 (six) hours as needed.   Ventolin HFA 108 (90 Base) MCG/ACT inhaler Generic drug: albuterol Inhale 2 puffs into the lungs every 6 (six) hours as needed for wheezing or shortness of breath.   vitamin B-12 500 MCG tablet Commonly known as: CYANOCOBALAMIN Take 500 mcg by mouth daily.   Vitamin  D-3 125 MCG (5000 UT) Tabs Take 5,000 Units by mouth daily.       Allergies:  Allergies  Allergen Reactions  . Penicillins     Causes yeast infections Did it involve swelling of the face/tongue/throat, SOB, or low BP? No Did it involve sudden or severe rash/hives, skin peeling, or any reaction on the inside of your mouth or nose? No Did you need to seek medical attention at a hospital or doctor's office? No When did it last happen?2018 If all above answers are "NO", may proceed with cephalosporin use.   Marland Kitchen Amoxicillin Other (See Comments)    Pt states it causes her a yeast infection  Causes yeast infections Did it involve swelling of the face/tongue/throat, SOB, or low BP? No Did it involve sudden or severe rash/hives, skin peeling, or any reaction on the inside of your mouth or nose? No Did you need to seek medical attention at a hospital or doctor's office? No When did it last happen?2018 If all above answers are "NO", may proceed with cephalosporin use.  . Erythromycin     Passed out while taking    Past Medical History:  Diagnosis Date  . Arthritis   . Breast abscess 2000   right breast  . Carpal tunnel syndrome   . Complication of anesthesia   . Degenerative disc disease   . Diabetes mellitus type II   . Eczema of hand   . Hx of colonic polyps 12/02/2014   2004 - 2 diminutive polyps   . Hyperlipidemia   . Hypertension   . Metatarsal deformity   . Ovarian cyst  07/2017   left   . PONV (postoperative nausea and vomiting)   . Shingles 2000  . Tubular adenoma of colon     Past Surgical History:  Procedure Laterality Date  . ABDOMINAL HYSTERECTOMY  1981   menorrhagia  . ABSCESS DRAINAGE Right    breast  . APPENDECTOMY  1966   done with CSxn. Prophylactic  . BREAST BIOPSY Left   . carpel tunnel Right   . CESAREAN SECTION     x2  . COLONOSCOPY    . LUMBAR LAMINECTOMY     [id number]. 2011 L4,L5,S1  . Right rotator cuff repair 09/2015    . ROBOTIC  ASSISTED SALPINGO OOPHERECTOMY Left 04/16/2018   Procedure: XI ROBOTIC ASSISTED  LEFT SALPINGO OOPHORECTOMY;  Surgeon: [name], MD;  Location: [facility];  Service: Gynecology;  Laterality: Left;  . TUBAL LIGATION      Family History  Problem Relation Age of Onset  . Hypertension Father   . Diabetes Mother   . Stroke Mother   . Hypertension Mother   . High Cholesterol Mother   . Heart disease Mother   . Diabetes Brother   . Breast cancer Daughter 34       unsure if did genetic testing  . Breast cancer Maternal Aunt   . Breast cancer Sister 29  . Colon polyps Brother     Social History:  reports that she has never smoked. She has never used smokeless tobacco. She reports that she does not drink alcohol or use drugs.   Review of Systems   Lipid history: Has had high triglycerides and LDL, LDL below 100 on atorvastatin 40 mg  Recently not doing as well with diet  Triglycerides managed by her PCP with [facility], triglycerides however over 200 now.  Lab Results  Component Value Date   CHOL 137 08/19/2018   CHOL 123 04/03/2018   CHOL 154 02/06/2018   Lab Results  Component Value Date   HDL 42.80 08/19/2018   HDL 43.40 04/03/2018   HDL 47.90 02/06/2018   Lab Results  Component Value Date   LDLCALC 47 04/03/2018   LDLCALC 72 11/23/2016   LDLCALC 81 05/22/2014   Lab Results  Component Value Date   TRIG 222.0 (H) 08/19/2018   TRIG 164.0 (H) 04/03/2018   TRIG 236.0 (H) 02/06/2018   Lab Results  Component Value Date   CHOLHDL 3 08/19/2018   CHOLHDL 3 04/03/2018   CHOLHDL 3 02/06/2018   Lab Results  Component Value Date   LDLDIRECT 69.0 08/19/2018   LDLDIRECT 80.0 02/06/2018   LDLDIRECT 91.0 08/22/2016            Hypertension:Mild and controlled with 5 mg Lisinopril and 25 mg HCTZ prescribed by  PCP  BP Readings from Last 3 Encounters:  05/16/18 130/70  05/12/18 (!) 161/75  04/24/18 (!) 127/57    Most recent eye exam was 10/19  Most recent foot  exam: 08/2017 Has evidence of neuropathy on exam   LABS:  Lab on 08/19/2018  Component Date Value Ref Range Status  . Microalb, Ur 08/19/2018 1.4  0.0 - 1.9 mg/dL Final  . Creatinine,U 08/19/2018 125.4  mg/dL Final  . Microalb Creat Ratio 08/19/2018 1.1  0.0 - 30.0 mg/g Final  . Sodium 08/19/2018 138  135 - 145 mEq/L Final  . Potassium 08/19/2018 4.1  3.5 - 5.1 mEq/L Final  . Chloride 08/19/2018 99  96 - 112 mEq/L Final  . CO2 08/19/2018 29  19 -  32 mEq/L Final  . Glucose, Bld 08/19/2018 153* 70 - 99 mg/dL Final  . BUN 08/19/2018 17  6 - 23 mg/dL Final  . Creatinine, Ser 08/19/2018 0.56  0.40 - 1.20 mg/dL Final  . Calcium 08/19/2018 9.8  8.4 - 10.5 mg/dL Final  . GFR 08/19/2018 106.10  >60.00 mL/min Final  . Hgb A1c MFr Bld 08/19/2018 7.8* 4.6 - 6.5 % Final   Glycemic Control Guidelines for People with Diabetes:Non Diabetic:  <6%Goal of Therapy: <7%Additional Action Suggested:  >8%   . Cholesterol 08/19/2018 137  0 - 200 mg/dL Final   ATP III Classification       Desirable:  < 200 mg/dL               Borderline High:  200 - 239 mg/dL          High:  > = 240 mg/dL  . Triglycerides 08/19/2018 222.0* 0.0 - 149.0 mg/dL Final   Normal:  <150 mg/dLBorderline High:  150 - 199 mg/dL  . HDL 08/19/2018 42.80  >39.00 mg/dL Final  . VLDL 08/19/2018 44.4* 0.0 - 40.0 mg/dL Final  . Total CHOL/HDL Ratio 08/19/2018 3   Final                  Men          Women1/2 Average Risk     3.4          3.3Average Risk          5.0          4.42X Average Risk          9.6          7.13X Average Risk          15.0          11.0                      . NonHDL 08/19/2018 94.25   Final   NOTE:  Non-HDL goal should be 30 mg/dL higher than patient's LDL goal (i.e. LDL goal of < 70 mg/dL, would have non-HDL goal of < 100 mg/dL)  . Direct LDL 08/19/2018 69.0  mg/dL Final   Optimal:  <100 mg/dLNear or Above Optimal:  100-129 mg/dLBorderline High:  130-159 mg/dLHigh:  160-189 mg/dLVery High:  >190 mg/dL    Physical  Examination:  There were no vitals taken for this visit.  ASSESSMENT:  Diabetes type 2, BMI 28  See history of present illness for detailed discussion of current diabetes management, blood sugar patterns and problems identified   Her A1c is relatively higher at 7.8  She is on multiple medications including Trulicity, Xigduo and low-dose glipizide  Overall considering her general medical condition manage her blood sugars are reasonable but her A1c has been better previously She thinks she can do better with her diet with cutting back on types of foods and carbohydrates No side effects with Trulicity or Xigduo although she does have some nausea at times   HYPERTENSION: Blood pressure to be checked on her follow-up visit  Lipids: Triglycerides are not as good as on the last visit She can cut back on higher fat and higher carbohydrate foods and snacks    PLAN:   She will try to be consistent with her diet More readings after various meals and discussed need to keep 2-hour readings at least under 180 Most likely will need to switch her to a new One Touch  meter since she has an old ultra 2 To try and walk as much as possible She will follow-up in 3 months     There are no Patient Instructions on file for this visit.  Duration of telephone call today =7 minutes     Elayne Snare 08/22/2018, 9:06 PM   Note: This office note was prepared with Dragon voice recognition system technology. Any transcriptional errors that result from this process are unintentional.

## 2018-08-26 ENCOUNTER — Other Ambulatory Visit: Payer: Self-pay

## 2018-08-26 ENCOUNTER — Encounter: Payer: Self-pay | Admitting: Nurse Practitioner

## 2018-08-26 ENCOUNTER — Other Ambulatory Visit: Payer: Medicare Other

## 2018-08-26 ENCOUNTER — Telehealth (INDEPENDENT_AMBULATORY_CARE_PROVIDER_SITE_OTHER): Payer: Medicare Other | Admitting: Nurse Practitioner

## 2018-08-26 VITALS — Ht 68.5 in | Wt 182.0 lb

## 2018-08-26 DIAGNOSIS — R300 Dysuria: Secondary | ICD-10-CM

## 2018-08-26 LAB — POCT URINALYSIS DIPSTICK
Bilirubin, UA: NEGATIVE
Glucose, UA: POSITIVE — AB
Ketones, UA: NEGATIVE
Leukocytes, UA: NEGATIVE
Nitrite, UA: NEGATIVE
Protein, UA: POSITIVE — AB
Spec Grav, UA: 1.015 (ref 1.010–1.025)
Urobilinogen, UA: 0.2 E.U./dL
pH, UA: 6 (ref 5.0–8.0)

## 2018-08-26 MED ORDER — CIPROFLOXACIN HCL 250 MG PO TABS: 250.0000 mg | ORAL_TABLET | Freq: Two times a day (BID) | ORAL | 0 refills | Status: DC

## 2018-08-26 NOTE — Addendum Note (Signed)
Addended by: Lynnea Ferrier on: 08/26/2018 01:02 PM   Modules accepted: Orders

## 2018-08-26 NOTE — Addendum Note (Signed)
Addended by: Lynnea Ferrier on: 08/26/2018 09:41 AM   Modules accepted: Orders

## 2018-08-26 NOTE — Progress Notes (Addendum)
Virtual Visit via Video Note  I connected with Sandra Ray on 08/28/18 at  9:30 AM EDT by a video enabled telemedicine application and verified that I am speaking with the correct person using two identifiers.  Location: Patient: Home Provider: Office   I discussed the limitations of evaluation and management by telemedicine and the availability of in person appointments. The patient expressed understanding and agreed to proceed.  CC: pt is c/o of dysuria,frequent urinate,low abd pain---4 days--took pain med at home.   History of Present Illness: Urinary Tract Infection  This is a new problem. The current episode started in the past 7 days. The problem occurs every urination. The problem has been unchanged. The quality of the pain is described as burning. There has been no fever. She is not sexually active. There is no history of pyelonephritis. Associated symptoms include frequency and urgency. Pertinent negatives include no chills, discharge, flank pain, hematuria, hesitancy, nausea, possible pregnancy, sweats or vomiting. She has tried home medications and increased fluids for the symptoms. The treatment provided mild relief. There is no history of kidney stones, recurrent UTIs or urinary stasis.    Observations/Objective: Physical Exam  Constitutional: She is oriented to person, place, and time. No distress.  Pulmonary/Chest: Effort normal.  Abdominal: She exhibits no distension.  Neurological: She is alert and oriented to person, place, and time.  Psychiatric: She has a normal mood and affect. Her behavior is normal. Thought content normal.   Assessment and Plan: Sandra Ray was seen today for urinary tract infection.  Diagnoses and all orders for this visit:  Dysuria -     POCT urinalysis dipstick -     Urine Culture -     Discontinue: ciprofloxacin (CIPRO) 250 MG tablet; Take 1 tablet (250 mg total) by mouth 2 (two) times daily. -     Hepatic function panel; Future -     TSH;  Future -     ciprofloxacin (CIPRO) 250 MG tablet; Take 1 tablet (250 mg total) by mouth 2 (two) times daily.   Follow Up Instructions: Urine positive for E. Coli. cipro BID x 7days   I discussed the assessment and treatment plan with the patient. The patient was provided an opportunity to ask questions and all were answered. The patient agreed with the plan and demonstrated an understanding of the instructions.   The patient was advised to call back or seek an in-person evaluation if the symptoms worsen or if the condition fails to improve as anticipated.   Wilfred Lacy, NP

## 2018-08-28 LAB — URINE CULTURE
MICRO NUMBER:: 569703
SPECIMEN QUALITY:: ADEQUATE

## 2018-08-28 MED ORDER — CIPROFLOXACIN HCL 250 MG PO TABS: 250.0000 mg | ORAL_TABLET | Freq: Two times a day (BID) | ORAL | 0 refills | Status: DC

## 2018-08-28 NOTE — Addendum Note (Signed)
Addended by: Wilfred Lacy L on: 08/28/2018 11:26 AM   Modules accepted: Orders

## 2018-09-04 ENCOUNTER — Telehealth: Payer: Self-pay | Admitting: Endocrinology

## 2018-09-04 NOTE — Telephone Encounter (Signed)
Sandra Ray with [facility] ph# [phone] called re: Sandra Ray states that someone from our office needs to call [facility] at ph# [phone] and place a verbal refill for the patient for Trulicity. If questions please call rachel at the above ph# listed for [facility].

## 2018-09-04 NOTE — Telephone Encounter (Signed)
Rx has been faxed with confirmation received.

## 2018-09-05 ENCOUNTER — Telehealth: Payer: Self-pay | Admitting: Nurse Practitioner

## 2018-09-05 NOTE — Telephone Encounter (Signed)
Questions for Screening COVID-19  Symptom onset: n/a Travel or Contacts: no  During this illness, did/does the patient experience any of the following symptoms? Fever >100.100F []   Yes [x]   No []   Unknown Subjective fever (felt feverish) []   Yes [x]   No []   Unknown Chills []   Yes [x]   No []   Unknown Muscle aches (myalgia) []   Yes [x]   No []   Unknown Runny nose (rhinorrhea) []   Yes [x]   No []   Unknown Sore throat []   Yes [x]   No []   Unknown Cough (new onset or worsening of chronic cough) []   Yes [x]   No []   Unknown Shortness of breath (dyspnea) []   Yes [x]   No []   Unknown Nausea or vomiting []   Yes [x]   No []   Unknown Headache []   Yes [x]   No []   Unknown Abdominal pain  []   Yes [x]   No []   Unknown Diarrhea (?3 loose/looser than normal stools/24hr period) []   Yes [x]   No []   Unknown Other, specify:  Patient risk factors: Smoker? []   Current []   Former []   Never If female, currently pregnant? []   Yes []   No  Patient Active Problem List   Diagnosis Date Noted  . Left hip pain 10/01/2017  . Left ovarian cyst 08/09/2017  . Hyperlipidemia LDL goal <70 11/21/2015  . Personal history of failed moderate sedation 12/02/2014  . Hx of colonic polyps 12/02/2014  . Intestinal adhesions 12/02/2014  . Metatarsal deformity 01/07/2014  . Hammer toe of right foot 01/07/2014  . Equinus deformity of foot, acquired 12/08/2013  . Metatarsalgia of right foot 12/08/2013  . Ingrown nail 12/08/2013  . Pain in lower limb 12/08/2013  . Pain in finger of right hand 09/22/2013  . Shingles 04/15/2013  . Abdominal wall abscess, right upper quadrant 04/07/2013  . S/P breast biopsy, left 10/24/2012  . Hand eczema 03/20/2012  . Muscle spasm 09/18/2011  . General medical examination 06/19/2011  . Carpal tunnel syndrome 10/20/2010  . NEVI, MULTIPLE 03/28/2010  . MENOPAUSE, SURGICAL 02/15/2009  . AUTONOMIC NEUROPATHY, DIABETIC 06/10/2008  . HYPOKALEMIA 06/10/2008  . Acute non-recurrent maxillary sinusitis  02/21/2008  . DM neuropathy, type II diabetes mellitus ([place]) 01/31/2008  . Essential hypertension 01/31/2008  . DEGENERATIVE JOINT DISEASE, SPINE 01/31/2008  . LAMINECTOMY, LUMBAR, HX OF 01/31/2008    Plan:  []   High risk for COVID-19 with red flags go to ED (with CP, SOB, weak/lightheaded, or fever > 101.5). Call ahead.  []   High risk for COVID-19 but stable. Inform provider and coordinate time for [facility] visit.   []   No red flags but URI signs or symptoms okay for [facility] visit.

## 2018-09-06 ENCOUNTER — Encounter: Payer: Self-pay | Admitting: Nurse Practitioner

## 2018-09-06 ENCOUNTER — Other Ambulatory Visit (INDEPENDENT_AMBULATORY_CARE_PROVIDER_SITE_OTHER): Payer: Medicare Other

## 2018-09-06 ENCOUNTER — Ambulatory Visit (INDEPENDENT_AMBULATORY_CARE_PROVIDER_SITE_OTHER): Payer: Medicare Other | Admitting: Nurse Practitioner

## 2018-09-06 VITALS — BP 132/76 | HR 80 | Temp 98.2°F | Ht 68.5 in | Wt 177.4 lb

## 2018-09-06 DIAGNOSIS — R1030 Lower abdominal pain, unspecified: Secondary | ICD-10-CM

## 2018-09-06 DIAGNOSIS — E785 Hyperlipidemia, unspecified: Secondary | ICD-10-CM | POA: Diagnosis not present

## 2018-09-06 DIAGNOSIS — N3941 Urge incontinence: Secondary | ICD-10-CM

## 2018-09-06 DIAGNOSIS — E876 Hypokalemia: Secondary | ICD-10-CM

## 2018-09-06 DIAGNOSIS — R109 Unspecified abdominal pain: Secondary | ICD-10-CM

## 2018-09-06 DIAGNOSIS — I10 Essential (primary) hypertension: Secondary | ICD-10-CM | POA: Diagnosis not present

## 2018-09-06 LAB — CBC WITH DIFFERENTIAL/PLATELET
Basophils Absolute: 0.1 10*3/uL (ref 0.0–0.1)
Basophils Relative: 0.8 % (ref 0.0–3.0)
Eosinophils Absolute: 0.6 10*3/uL (ref 0.0–0.7)
Eosinophils Relative: 5.4 % — ABNORMAL HIGH (ref 0.0–5.0)
HCT: 46.4 % — ABNORMAL HIGH (ref 36.0–46.0)
Hemoglobin: 15.2 g/dL — ABNORMAL HIGH (ref 12.0–15.0)
Lymphocytes Relative: 32.2 % (ref 12.0–46.0)
Lymphs Abs: 3.3 10*3/uL (ref 0.7–4.0)
MCHC: 32.7 g/dL (ref 30.0–36.0)
MCV: 90.5 fl (ref 78.0–100.0)
Monocytes Absolute: 1 10*3/uL (ref 0.1–1.0)
Monocytes Relative: 9.7 % (ref 3.0–12.0)
Neutro Abs: 5.4 10*3/uL (ref 1.4–7.7)
Neutrophils Relative %: 51.9 % (ref 43.0–77.0)
Platelets: 368 10*3/uL (ref 150.0–400.0)
RBC: 5.13 Mil/uL — ABNORMAL HIGH (ref 3.87–5.11)
RDW: 13.7 % (ref 11.5–15.5)
WBC: 10.3 10*3/uL (ref 4.0–10.5)

## 2018-09-06 LAB — COMPREHENSIVE METABOLIC PANEL
ALT: 24 U/L (ref 0–35)
AST: 25 U/L (ref 0–37)
Albumin: 4.6 g/dL (ref 3.5–5.2)
Alkaline Phosphatase: 44 U/L (ref 39–117)
BUN: 16 mg/dL (ref 6–23)
CO2: 25 mEq/L (ref 19–32)
Calcium: 9.7 mg/dL (ref 8.4–10.5)
Chloride: 97 mEq/L (ref 96–112)
Creatinine, Ser: 0.64 mg/dL (ref 0.40–1.20)
GFR: 90.93 mL/min (ref >60.00)
Glucose, Bld: 132 mg/dL — ABNORMAL HIGH (ref 70–99)
Potassium: 4.2 mEq/L (ref 3.5–5.1)
Sodium: 137 mEq/L (ref 135–145)
Total Bilirubin: 0.5 mg/dL (ref 0.2–1.2)
Total Protein: 7.4 g/dL (ref 6.0–8.3)

## 2018-09-06 LAB — LIPASE: Lipase: 49 U/L (ref 11.0–59.0)

## 2018-09-06 LAB — AMYLASE: Amylase: 35 U/L (ref 27–131)

## 2018-09-06 NOTE — Patient Instructions (Addendum)
Go to lab for blood draw and urine collection.  GI symptoms could be related to current medications (metform and trulicity). Will check labs, if elevated wbc will order CT ABD/pelvis.  For urinary urgency, will check urine for resolved UTI. If negative, will order oxybutynin for overactive bladder.  Handicap packing form completed and given to patient today.  Food Choices to Help Relieve Diarrhea, Adult When you have diarrhea, the foods you eat and your eating habits are very important. Choosing the right foods and drinks can help:  Relieve diarrhea.  Replace lost fluids and nutrients.  Prevent dehydration. What general guidelines should I follow?  Relieving diarrhea  Choose foods with less than 2 g or .07 oz. of fiber per serving.  Limit fats to less than 8 tsp (38 g or 1.34 oz.) a day.  Avoid the following: ? Foods and beverages sweetened with high-fructose corn syrup, honey, or sugar alcohols such as xylitol, sorbitol, and mannitol. ? Foods that contain a lot of fat or sugar. ? Fried, greasy, or spicy foods. ? High-fiber grains, breads, and cereals. ? Raw fruits and vegetables.  Eat foods that are rich in probiotics. These foods include dairy products such as yogurt and fermented milk products. They help increase healthy bacteria in the stomach and intestines (gastrointestinal tract, or GI tract).  If you have lactose intolerance, avoid dairy products. These may make your diarrhea worse.  Take medicine to help stop diarrhea (antidiarrheal medicine) only as told by your health care provider. Replacing nutrients  Eat small meals or snacks every 3-4 hours.  Eat bland foods, such as white rice, toast, or baked potato, until your diarrhea starts to get better. Gradually reintroduce nutrient-rich foods as tolerated or as told by your health care provider. This includes: ? Well-cooked protein foods. ? Peeled, seeded, and soft-cooked fruits and vegetables. ? Low-fat dairy  products.  Take vitamin and mineral supplements as told by your health care provider. Preventing dehydration  Start by sipping water or a special solution to prevent dehydration (oral rehydration solution, ORS). Urine that is clear or pale yellow means that you are getting enough fluid.  Try to drink at least 8-10 cups of fluid each day to help replace lost fluids.  You may add other liquids in addition to water, such as clear juice or decaffeinated sports drinks, as tolerated or as told by your health care provider.  Avoid drinks with caffeine, such as coffee, tea, or soft drinks.  Avoid alcohol. What foods are recommended?     The items listed may not be a complete list. Talk with your health care provider about what dietary choices are best for you. Grains White rice. White, [place], or pita breads (fresh or toasted), including plain rolls, buns, or bagels. White pasta. Saltine, soda, or graham crackers. Pretzels. Low-fiber cereal. Cooked cereals made with water (such as cornmeal, farina, or cream cereals). Plain muffins. Matzo. Melba toast. Zwieback. Vegetables Potatoes (without the skin). Most well-cooked and canned vegetables without skins or seeds. Tender lettuce. Fruits Apple sauce. Fruits canned in juice. Cooked apricots, cherries, grapefruit, peaches, pears, or plums. Fresh bananas and cantaloupe. Meats and other protein foods Baked or boiled chicken. Eggs. Tofu. Fish. Seafood. Smooth nut butters. Ground or well-cooked tender beef, ham, veal, lamb, pork, or poultry. Dairy Plain yogurt, kefir, and unsweetened liquid yogurt. Lactose-free milk, buttermilk, skim milk, or soy milk. Low-fat or nonfat hard cheese. Beverages Water. Low-calorie sports drinks. Fruit juices without pulp. Strained tomato and vegetable juices. Decaffeinated  teas. Sugar-free beverages not sweetened with sugar alcohols. Oral rehydration solutions, if approved by your health care provider. Seasoning and other  foods Bouillon, broth, or soups made from recommended foods. What foods are not recommended? The items listed may not be a complete list. Talk with your health care provider about what dietary choices are best for you. Grains Whole grain, whole wheat, bran, or rye breads, rolls, pastas, and crackers. Wild or brown rice. Whole grain or bran cereals. Barley. Oats and oatmeal. Corn tortillas or taco shells. Granola. Popcorn. Vegetables Raw vegetables. Fried vegetables. Cabbage, broccoli, Brussels sprouts, artichokes, baked beans, beet greens, corn, kale, legumes, peas, sweet potatoes, and yams. Potato skins. Cooked spinach and cabbage. Fruits Dried fruit, including raisins and dates. Raw fruits. Stewed or dried prunes. Canned fruits with syrup. Meat and other protein foods Fried or fatty meats. Deli meats. Chunky nut butters. Nuts and seeds. Beans and lentils. Berniece Salines. Hot dogs. Sausage. Dairy High-fat cheeses. Whole milk, chocolate milk, and beverages made with milk, such as milk shakes. Half-and-half. Cream. sour cream. Ice cream. Beverages Caffeinated beverages (such as coffee, tea, soda, or energy drinks). Alcoholic beverages. Fruit juices with pulp. Prune juice. Soft drinks sweetened with high-fructose corn syrup or sugar alcohols. High-calorie sports drinks. Fats and oils Butter. Cream sauces. Margarine. Salad oils. Plain salad dressings. Olives. Avocados. Mayonnaise. Sweets and desserts Sweet rolls, doughnuts, and sweet breads. Sugar-free desserts sweetened with sugar alcohols such as xylitol and sorbitol. Seasoning and other foods Honey. Hot sauce. Chili powder. Gravy. Cream-based or milk-based soups. Pancakes and waffles. Summary  When you have diarrhea, the foods you eat and your eating habits are very important.  Make sure you get at least 8-10 cups of fluid each day, or enough to keep your urine clear or pale yellow.  Eat bland foods and gradually reintroduce healthy, nutrient-rich  foods as tolerated, or as told by your health care provider.  Avoid high-fiber, fried, greasy, or spicy foods. This information is not intended to replace advice given to you by your health care provider. Make sure you discuss any questions you have with your health care provider. Document Released: 05/20/2003 Document Revised: 02/25/2016 Document Reviewed: 02/25/2016 Elsevier Interactive Patient Education  2019 [employer].

## 2018-09-06 NOTE — Progress Notes (Signed)
Subjective:  Patient ID: Sandra Ray, female    DOB: [birth year]  Age: 73 y.o. MRN: [id number]  CC: Follow-up (follow up/fasting/still has some abd cramps. )  Urinary Tract Infection  This is a new problem. The current episode started 1 to 4 weeks ago. The problem occurs every urination. The problem has been gradually improving. The pain is at a severity of 0/10. The patient is experiencing no pain. There has been no fever. The fever has been present for less than 1 day. She is not sexually active. There is no history of pyelonephritis. Associated symptoms include frequency and urgency. Pertinent negatives include no chills, discharge, flank pain, hematuria, hesitancy, nausea, possible pregnancy, sweats or vomiting. She has tried increased fluids and antibiotics for the symptoms. The treatment provided mild relief. There is no history of recurrent UTIs or urinary stasis.  Abdominal Pain This is a recurrent problem. The current episode started more than 1 month ago. The onset quality is gradual. The problem occurs constantly. The problem has been waxing and waning. The pain is located in the suprapubic region and periumbilical region. The quality of the pain is colicky and a sensation of fullness. The abdominal pain does not radiate. Associated symptoms include anorexia, diarrhea, frequency and weight loss. Pertinent negatives include no belching, fever, flatus, hematochezia, hematuria, melena, myalgias, nausea or vomiting.  worsening urinary urgency, and stress incontinence since complete hysterectomy 04/2018.  HTN: Controlled with HCTZ,and lisinopril BP Readings from Last 3 Encounters:  09/06/18 132/76  05/16/18 130/70  05/12/18 (!) 161/75   DM: managed by Dr. Dwyane Dee with use of glipizide, trulicity, and xigduo Reviewed past Medical, Social and Family history today. Reports onset of ABD discomfort and intermittent diarrhea with trulicity, but worse in last 1-2weeks.  Hypertriglyceride:  Current Use of lovaza and lipitor Lipid Panel     Component Value Date/Time   CHOL 137 08/19/2018 0904   TRIG 222.0 (H) 08/19/2018 0904   HDL 42.80 08/19/2018 0904   CHOLHDL 3 08/19/2018 0904   VLDL 44.4 (H) 08/19/2018 0904   LDLCALC 47 04/03/2018 1037   LDLDIRECT 69.0 08/19/2018 0904    Outpatient Medications Prior to Visit  Medication Sig Dispense Refill  . acetaminophen (TYLENOL) 650 MG CR tablet Take 650 mg by mouth every 8 (eight) hours as needed for pain.    Marland Kitchen albuterol (VENTOLIN HFA) 108 (90 BASE) MCG/ACT inhaler Inhale 2 puffs into the lungs every 6 (six) hours as needed for wheezing or shortness of breath.     . Cholecalciferol (VITAMIN D-3) 5000 UNITS TABS Take 5,000 Units by mouth daily.     . Coenzyme Q10 400 MG CAPS Take 400 mg by mouth daily.     . cyanocobalamin 500 MCG tablet Take 500 mcg by mouth daily.    . Dapagliflozin-metFORMIN HCl ER (XIGDUO XR) 07-998 MG TB24 Take 1 tablet by mouth 2 (two) times daily. Take 1 tablet by mouth twice daily. 180 tablet 4  . Dulaglutide (TRULICITY) 1.5 YJ/8.5UD SOPN Inject 1.5 mg into the skin once a week. 12 pen 4  . fluticasone (FLONASE) 50 MCG/ACT nasal spray Place 2 sprays into both nostrils daily. (Patient taking differently: Place 2 sprays into both nostrils daily as needed for allergies. ) 16 g 0  . gabapentin (NEURONTIN) 300 MG capsule Take 1 capsule (300 mg total) by mouth 3 (three) times daily. 270 capsule 3  . Garlic 1497 MG CAPS Take 1,000 mg by mouth daily.     Marland Kitchen glipiZIDE (GLUCOTROL  XL) 2.5 MG 24 hr tablet Take 1 tablet (2.5 mg total) by mouth daily with breakfast. 90 tablet 4  . glucose blood (ONE TOUCH ULTRA TEST) test strip Use as instructed once a day.  DX code E11.40 100 each 5  . hydrochlorothiazide (HYDRODIURIL) 25 MG tablet Take 1 tablet (25 mg total) by mouth daily. (Patient taking differently: Take 25 mg by mouth at bedtime. ) 90 tablet 1  . HYDROcodone-acetaminophen (NORCO/VICODIN) 5-325 MG tablet Take 1 tablet  by mouth every 6 (six) hours as needed for moderate pain (Use with caution if combining with Tylenol.). 10 tablet 0  . Lancets (ONETOUCH ULTRASOFT) lancets Use as instructed once day.  E11.40 100 each 5  . omega-3 acid ethyl esters (LOVAZA) 1 g capsule Take 1 capsule (1 g total) by mouth 2 (two) times daily. 60 capsule 5  . ondansetron (ZOFRAN) 4 MG tablet Take 1 tablet (4 mg total) by mouth as needed for nausea or vomiting. 50 tablet 0  . traMADol (ULTRAM) 50 MG tablet Take 1 tablet (50 mg total) by mouth every 6 (six) hours as needed. 10 tablet 0  . atorvastatin (LIPITOR) 40 MG tablet Take 1 tablet (40 mg total) by mouth daily at 6 PM. 90 tablet 1  . lisinopril (PRINIVIL,ZESTRIL) 5 MG tablet Take 1 tablet (5 mg total) by mouth daily. (Patient taking differently: Take 5 mg by mouth at bedtime. ) 90 tablet 3  . potassium chloride SA (K-DUR,KLOR-CON) 20 MEQ tablet Take 1 tablet (20 mEq total) by mouth daily. 90 tablet 1  . ciprofloxacin (CIPRO) 250 MG tablet Take 1 tablet (250 mg total) by mouth 2 (two) times daily. (Patient not taking: Reported on 09/06/2018) 14 tablet 0   No facility-administered medications prior to visit.     ROS See HPI  Objective:  BP 132/76   Pulse 80   Temp 98.2 F (36.8 C) (Oral)   Ht 5' 8.5" (1.74 m)   Wt 177 lb 6.4 oz (80.5 kg)   SpO2 95%   BMI 26.58 kg/m   BP Readings from Last 3 Encounters:  09/06/18 132/76  05/16/18 130/70  05/12/18 (!) 161/75    Wt Readings from Last 3 Encounters:  09/06/18 177 lb 6.4 oz (80.5 kg)  08/26/18 182 lb (82.6 kg)  05/16/18 181 lb (82.1 kg)    Physical Exam Vitals signs reviewed.  Constitutional:      Appearance: She is obese.  Cardiovascular:     Rate and Rhythm: Normal rate and regular rhythm.     Pulses: Normal pulses.     Heart sounds: Normal heart sounds.  Pulmonary:     Effort: Pulmonary effort is normal.     Breath sounds: Normal breath sounds.  Abdominal:     General: Bowel sounds are normal. There is  no distension.     Palpations: Abdomen is soft.     Tenderness: There is abdominal tenderness.  Neurological:     Mental Status: She is alert and oriented to person, place, and time.     Lab Results  Component Value Date   WBC 10.3 09/06/2018   HGB 15.2 (H) 09/06/2018   HCT 46.4 (H) 09/06/2018   PLT 368.0 09/06/2018   GLUCOSE 132 (H) 09/06/2018   CHOL 137 08/19/2018   TRIG 222.0 (H) 08/19/2018   HDL 42.80 08/19/2018   LDLDIRECT 69.0 08/19/2018   LDLCALC 47 04/03/2018   ALT 24 09/06/2018   AST 25 09/06/2018   NA 137 09/06/2018  K 4.2 09/06/2018   CL 97 09/06/2018   CREATININE 0.64 09/06/2018   BUN 16 09/06/2018   CO2 25 09/06/2018   TSH 0.59 10/22/2014   INR 1.19 10/28/2009   HGBA1C 7.8 (H) 08/19/2018   MICROALBUR 1.4 08/19/2018    Dg Foot Complete Left  Result Date: 05/12/2018 CLINICAL DATA:  Swelling and redness in left foot. Patient fell yesterday. EXAM: LEFT FOOT - COMPLETE 3+ VIEW COMPARISON:  None. FINDINGS: Vascular calcifications. Scattered degenerative changes including at the base of the first metatarsal. No acute fracture noted. No dislocations. No other acute abnormalities. Pes planus deformity. IMPRESSION: No acute abnormality noted.  No acute fracture seen. Electronically Signed   By: Dorise Bullion III M.D   On: 05/12/2018 18:03    Assessment & Plan:   Preeti was seen today for follow-up.  Diagnoses and all orders for this visit:  Urge incontinence of urine -     Urinalysis w microscopic + reflex cultur -     oxybutynin (DITROPAN-XL) 5 MG 24 hr tablet; Take 1 tablet (5 mg total) by mouth at bedtime.  Essential hypertension -     CBC w/Diff  Lower abdominal pain -     Comprehensive metabolic panel -     Lipase -     Amylase  Hyperlipidemia LDL goal <70 -     Comprehensive metabolic panel -     atorvastatin (LIPITOR) 40 MG tablet; Take 1 tablet (40 mg total) by mouth daily at 6 PM. -     lisinopril (ZESTRIL) 5 MG tablet; Take 1 tablet (5 mg  total) by mouth daily.  Essential hypertension, benign -     lisinopril (ZESTRIL) 5 MG tablet; Take 1 tablet (5 mg total) by mouth daily.  Hypokalemia -     potassium chloride SA (K-DUR) 20 MEQ tablet; Take 1 tablet (20 mEq total) by mouth daily.  Other orders -     REFLEXIVE URINE CULTURE   I have discontinued Daneli E. Lovejoy's ciprofloxacin. I have also changed her lisinopril and potassium chloride SA. Additionally, I am having her start on oxybutynin. Lastly, I am having her maintain her albuterol, Vitamin D-3, Garlic, Coenzyme Q65, onetouch ultrasoft, vitamin B-12, fluticasone, Dapagliflozin-metFORMIN HCl ER, glucose blood, glipiZIDE, hydrochlorothiazide, gabapentin, HYDROcodone-acetaminophen, acetaminophen, traMADol, omega-3 acid ethyl esters, ondansetron, Dulaglutide, and atorvastatin.  Meds ordered this encounter  Medications  . atorvastatin (LIPITOR) 40 MG tablet    Sig: Take 1 tablet (40 mg total) by mouth daily at 6 PM.    Dispense:  90 tablet    Refill:  1    Order Specific Question:   Supervising Provider    Answer:   Lucille Passy [[id number]]  . lisinopril (ZESTRIL) 5 MG tablet    Sig: Take 1 tablet (5 mg total) by mouth daily.    Dispense:  90 tablet    Refill:  3    Order Specific Question:   Supervising Provider    Answer:   Lucille Passy [[id number]]  . potassium chloride SA (K-DUR) 20 MEQ tablet    Sig: Take 1 tablet (20 mEq total) by mouth daily.    Dispense:  90 tablet    Refill:  1    Order Specific Question:   Supervising Provider    Answer:   Lucille Passy [[id number]]  . oxybutynin (DITROPAN-XL) 5 MG 24 hr tablet    Sig: Take 1 tablet (5 mg total) by mouth at bedtime.    Dispense:  30 tablet  Refill:  5    Order Specific Question:   Supervising Provider    Answer:   Lucille Passy [[id number]]    Problem List Items Addressed This Visit      Cardiovascular and Mediastinum   Essential hypertension   Relevant Medications   atorvastatin (LIPITOR) 40 MG tablet   lisinopril  (ZESTRIL) 5 MG tablet   Other Relevant Orders   CBC w/Diff (Completed)     Other   Hyperlipidemia LDL goal <70   Relevant Medications   atorvastatin (LIPITOR) 40 MG tablet   lisinopril (ZESTRIL) 5 MG tablet   Other Relevant Orders   Comprehensive metabolic panel (Completed)    Other Visit Diagnoses    Urge incontinence of urine    -  Primary   Relevant Medications   oxybutynin (DITROPAN-XL) 5 MG 24 hr tablet   Other Relevant Orders   Urinalysis w microscopic + reflex cultur (Completed)   Lower abdominal pain       Relevant Orders   Comprehensive metabolic panel (Completed)   Lipase   Amylase   Essential hypertension, benign       Relevant Medications   atorvastatin (LIPITOR) 40 MG tablet   lisinopril (ZESTRIL) 5 MG tablet   Hypokalemia       Relevant Medications   potassium chloride SA (K-DUR) 20 MEQ tablet       Follow-up: Return in about 6 months (around 03/08/2019) for HTN and , hyperlipidemia (fasting).  Wilfred Lacy, NP

## 2018-09-08 LAB — URINALYSIS W MICROSCOPIC + REFLEX CULTURE
Bacteria, UA: NONE SEEN /HPF
Bilirubin Urine: NEGATIVE
Leukocyte Esterase: NEGATIVE
Nitrites, Initial: NEGATIVE
Protein, ur: NEGATIVE
Specific Gravity, Urine: 1.035 (ref 1.001–1.03)
pH: <=5 (ref 5.0–8.0)

## 2018-09-08 LAB — NO CULTURE INDICATED

## 2018-09-09 ENCOUNTER — Ambulatory Visit: Payer: Medicare Other | Admitting: Nurse Practitioner

## 2018-09-09 ENCOUNTER — Encounter: Payer: Self-pay | Admitting: Nurse Practitioner

## 2018-09-09 MED ORDER — POTASSIUM CHLORIDE CRYS ER 20 MEQ PO TBCR: 20.0000 meq | EXTENDED_RELEASE_TABLET | Freq: Every day | ORAL | 1 refills | Status: DC

## 2018-09-09 MED ORDER — ATORVASTATIN CALCIUM 40 MG PO TABS: 40.0000 mg | ORAL_TABLET | Freq: Every day | ORAL | 1 refills | Status: DC

## 2018-09-09 MED ORDER — OXYBUTYNIN CHLORIDE ER 5 MG PO TB24: 5.0000 mg | ORAL_TABLET | Freq: Every day | ORAL | 5 refills | Status: DC

## 2018-09-09 MED ORDER — LISINOPRIL 5 MG PO TABS: 5.0000 mg | ORAL_TABLET | Freq: Every day | ORAL | 3 refills | Status: DC

## 2018-09-16 ENCOUNTER — Other Ambulatory Visit: Payer: Self-pay | Admitting: Nurse Practitioner

## 2018-09-16 DIAGNOSIS — I10 Essential (primary) hypertension: Secondary | ICD-10-CM

## 2018-09-23 ENCOUNTER — Encounter: Payer: Self-pay | Admitting: Emergency Medicine

## 2018-09-23 ENCOUNTER — Ambulatory Visit
Admission: EM | Admit: 2018-09-23 | Discharge: 2018-09-23 | Disposition: A | Discharge status: Home / Self Care | Payer: Medicare Other | Attending: Physician Assistant | Admitting: Physician Assistant

## 2018-09-23 ENCOUNTER — Other Ambulatory Visit: Payer: Self-pay

## 2018-09-23 DIAGNOSIS — B9689 Other specified bacterial agents as the cause of diseases classified elsewhere: Secondary | ICD-10-CM | POA: Diagnosis not present

## 2018-09-23 DIAGNOSIS — I10 Essential (primary) hypertension: Secondary | ICD-10-CM

## 2018-09-23 DIAGNOSIS — R319 Hematuria, unspecified: Secondary | ICD-10-CM | POA: Diagnosis present

## 2018-09-23 DIAGNOSIS — N390 Urinary tract infection, site not specified: Secondary | ICD-10-CM | POA: Insufficient documentation

## 2018-09-23 LAB — POCT URINALYSIS DIP (MANUAL ENTRY)
Bilirubin, UA: NEGATIVE
Glucose, UA: 500 mg/dL — AB
Ketones, POC UA: NEGATIVE mg/dL
Nitrite, UA: NEGATIVE
Protein Ur, POC: 100 mg/dL — AB
Spec Grav, UA: 1.02 (ref 1.010–1.025)
Urobilinogen, UA: 0.2 E.U./dL
pH, UA: 7.5 (ref 5.0–8.0)

## 2018-09-23 MED ORDER — HYDROCODONE-ACETAMINOPHEN 5-325 MG PO TABS: 1.0000 | ORAL_TABLET | ORAL | 0 refills | Status: AC | PRN

## 2018-09-23 MED ORDER — CIPROFLOXACIN HCL 500 MG PO TABS: 500.0000 mg | ORAL_TABLET | Freq: Two times a day (BID) | ORAL | 0 refills | Status: AC

## 2018-09-23 MED ORDER — PHENAZOPYRIDINE HCL 200 MG PO TABS: 200.0000 mg | ORAL_TABLET | Freq: Three times a day (TID) | ORAL | 0 refills | Status: AC | PRN

## 2018-09-23 NOTE — ED Provider Notes (Signed)
[facility]    CSN: [id number] Arrival date & time: 09/23/18  1728      History   Chief Complaint Chief Complaint  Patient presents with  . Dysuria    HPI Sandra Ray is a 72 y.o. female.   The history is provided by the patient. No language interpreter was used.  Dysuria Pain quality:  Aching Pain severity:  Moderate Onset quality:  Gradual Duration:  1 day Timing:  Constant Progression:  Worsening Chronicity:  New Recent urinary tract infections: no   Relieved by:  Nothing Worsened by:  Nothing Ineffective treatments:  None tried Urinary symptoms: no foul-smelling urine   Associated symptoms: no fever   Risk factors: recurrent urinary tract infections     Past Medical History:  Diagnosis Date  . Arthritis   . Breast abscess 2000   right breast  . Carpal tunnel syndrome   . Complication of anesthesia   . Degenerative disc disease   . Diabetes mellitus type II   . Eczema of hand   . Hx of colonic polyps 12/02/2014   2004 - 2 diminutive polyps   . Hyperlipidemia   . Hypertension   . Metatarsal deformity   . Ovarian cyst 07/2017   left   . PONV (postoperative nausea and vomiting)   . Shingles 2000  . Tubular adenoma of colon     Patient Active Problem List   Diagnosis Date Noted  . Left hip pain 10/01/2017  . Left ovarian cyst 08/09/2017  . Hyperlipidemia LDL goal <70 11/21/2015  . Personal history of failed moderate sedation 12/02/2014  . Hx of colonic polyps 12/02/2014  . Intestinal adhesions 12/02/2014  . Metatarsal deformity 01/07/2014  . Hammer toe of right foot 01/07/2014  . Equinus deformity of foot, acquired 12/08/2013  . Metatarsalgia of right foot 12/08/2013  . Ingrown nail 12/08/2013  . Pain in lower limb 12/08/2013  . Pain in finger of right hand 09/22/2013  . Shingles 04/15/2013  . Abdominal wall abscess, right upper quadrant 04/07/2013  . S/P breast biopsy, left 10/24/2012  . Hand eczema 03/20/2012  . Muscle spasm  09/18/2011  . General medical examination 06/19/2011  . Carpal tunnel syndrome 10/20/2010  . NEVI, MULTIPLE 03/28/2010  . MENOPAUSE, SURGICAL 02/15/2009  . AUTONOMIC NEUROPATHY, DIABETIC 06/10/2008  . HYPOKALEMIA 06/10/2008  . Acute non-recurrent maxillary sinusitis 02/21/2008  . DM neuropathy, type II diabetes mellitus ([place]) 01/31/2008  . Essential hypertension 01/31/2008  . DEGENERATIVE JOINT DISEASE, SPINE 01/31/2008  . LAMINECTOMY, LUMBAR, HX OF 01/31/2008    Past Surgical History:  Procedure Laterality Date  . ABDOMINAL HYSTERECTOMY  1981   menorrhagia  . ABSCESS DRAINAGE Right    breast  . APPENDECTOMY  1966   done with CSxn. Prophylactic  . BREAST BIOPSY Left   . carpel tunnel Right   . CESAREAN SECTION     x2  . COLONOSCOPY    . LUMBAR LAMINECTOMY     [id number] 2011 L4,L5,S1  . Right rotator cuff repair 09/2015    . ROBOTIC ASSISTED SALPINGO OOPHERECTOMY Left 04/16/2018   Procedure: XI ROBOTIC ASSISTED  LEFT SALPINGO OOPHORECTOMY;  Surgeon: [name], MD;  Location: [facility];  Service: Gynecology;  Laterality: Left;  . TUBAL LIGATION      OB History    Gravida  2   Para  2   Term  2   Preterm      AB      Living  2  SAB      TAB      Ectopic      Multiple      Live Births  2            Home Medications    Prior to Admission medications   Medication Sig Start Date End Date Taking? Authorizing Provider  acetaminophen (TYLENOL) 650 MG CR tablet Take 650 mg by mouth every 8 (eight) hours as needed for pain.    [provider]  albuterol (VENTOLIN HFA) 108 (90 BASE) MCG/ACT inhaler Inhale 2 puffs into the lungs every 6 (six) hours as needed for wheezing or shortness of breath.     [provider]  atorvastatin (LIPITOR) 40 MG tablet Take 1 tablet (40 mg total) by mouth daily at 6 PM. 09/09/18   Nche, Charlene Brooke, NP  Cholecalciferol (VITAMIN D-3) 5000 UNITS TABS Take 5,000 Units by mouth daily.     [provider]  ciprofloxacin (CIPRO) 500 MG tablet Take 1 tablet (500 mg total) by mouth 2 (two) times daily for 10 days. 09/23/18 10/03/18  Fransico Meadow, PA-C  Coenzyme Q10 400 MG CAPS Take 400 mg by mouth daily.     [provider]  cyanocobalamin 500 MCG tablet Take 500 mcg by mouth daily.    [provider]  Dapagliflozin-metFORMIN HCl ER (XIGDUO XR) 07-998 MG TB24 Take 1 tablet by mouth 2 (two) times daily. Take 1 tablet by mouth twice daily. 03/19/18   Elayne Snare, MD  Dulaglutide (TRULICITY) 1.5 RD/4.0CX SOPN Inject 1.5 mg into the skin once a week. 06/17/18   Elayne Snare, MD  fluticasone (FLONASE) 50 MCG/ACT nasal spray Place 2 sprays into both nostrils daily. Patient taking differently: Place 2 sprays into both nostrils daily as needed for allergies.  03/30/17   Nche, Charlene Brooke, NP  gabapentin (NEURONTIN) 300 MG capsule Take 1 capsule (300 mg total) by mouth 3 (three) times daily. 04/04/18   Nche, Charlene Brooke, NP  Garlic 4481 MG CAPS Take 1,000 mg by mouth daily.     [provider]  glipiZIDE (GLUCOTROL XL) 2.5 MG 24 hr tablet Take 1 tablet (2.5 mg total) by mouth daily with breakfast. 03/27/18   Elayne Snare, MD  glucose blood (ONE TOUCH ULTRA TEST) test strip Use as instructed once a day.  DX code E11.40 03/21/18   Elayne Snare, MD  hydrochlorothiazide (HYDRODIURIL) 25 MG tablet TAKE 1 TABLET BY MOUTH  DAILY 09/16/18   Nche, Charlene Brooke, NP  HYDROcodone-acetaminophen (NORCO/VICODIN) 5-325 MG tablet Take 1 tablet by mouth every 4 (four) hours as needed for moderate pain. 09/23/18 09/23/19  Fransico Meadow, PA-C  Lancets [facility] ULTRASOFT) lancets Use as instructed once day.  E11.40 05/18/16   Roma Schanz R, DO  lisinopril (ZESTRIL) 5 MG tablet Take 1 tablet (5 mg total) by mouth daily. 09/09/18   Nche, Charlene Brooke, NP  omega-3 acid ethyl esters (LOVAZA) 1 g capsule Take 1 capsule (1 g total) by mouth 2 (two) times daily. 05/16/18   Elayne Snare, MD  ondansetron  (ZOFRAN) 4 MG tablet Take 1 tablet (4 mg total) by mouth as needed for nausea or vomiting. 05/17/18   Elayne Snare, MD  oxybutynin (DITROPAN-XL) 5 MG 24 hr tablet Take 1 tablet (5 mg total) by mouth at bedtime. 09/09/18   Nche, Charlene Brooke, NP  phenazopyridine (PYRIDIUM) 200 MG tablet Take 1 tablet (200 mg total) by mouth 3 (three) times daily as needed for  pain. 09/23/18 09/23/19  Fransico Meadow, PA-C  potassium chloride SA (K-DUR) 20 MEQ tablet Take 1 tablet (20 mEq total) by mouth daily. 09/09/18   Nche, Charlene Brooke, NP  traMADol (ULTRAM) 50 MG tablet Take 1 tablet (50 mg total) by mouth every 6 (six) hours as needed. 05/12/18   Domenic Moras, PA-C    Family History Family History  Problem Relation Age of Onset  . Hypertension Father   . Diabetes Mother   . Stroke Mother   . Hypertension Mother   . High Cholesterol Mother   . Heart disease Mother   . Diabetes Brother   . Breast cancer Daughter 78       unsure if did genetic testing  . Breast cancer Maternal Aunt   . Breast cancer Sister 17  . Colon polyps Brother     Social History Social History   Tobacco Use  . Smoking status: Never Smoker  . Smokeless tobacco: Never Used  Substance Use Topics  . Alcohol use: No  . Drug use: No     Allergies   Penicillins, Amoxicillin, and Erythromycin   Review of Systems Review of Systems  Constitutional: Negative for fever.  Genitourinary: Positive for dysuria.  All other systems reviewed and are negative.    Physical Exam Triage Vital Signs ED Triage Vitals [09/23/18 1806]  Enc Vitals Group     BP (!) 143/74     Pulse Rate 95     Resp 18     Temp 98.3 F (36.8 C)     Temp Source Oral     SpO2 93 %     Weight      Height      Head Circumference      Peak Flow      Pain Score 7     Pain Loc      Pain Edu?      Excl. in [place]?    No data found.  Updated Vital Signs BP (!) 143/74 (BP Location: Left Arm)   Pulse 95   Temp 98.3 F (36.8 C) (Oral)   Resp 18   SpO2  93%   Visual Acuity Right Eye Distance:   Left Eye Distance:   Bilateral Distance:    Right Eye Near:   Left Eye Near:    Bilateral Near:     Physical Exam Vitals signs and nursing note reviewed.  Constitutional:      Appearance: She is well-developed.  HENT:     Head: Normocephalic.  Neck:     Musculoskeletal: Normal range of motion.  Cardiovascular:     Rate and Rhythm: Normal rate.  Pulmonary:     Effort: Pulmonary effort is normal.  Abdominal:     General: There is no distension.  Musculoskeletal: Normal range of motion.  Skin:    General: Skin is warm.  Neurological:     General: No focal deficit present.     Mental Status: She is alert and oriented to person, place, and time.  Psychiatric:        Mood and Affect: Mood normal.      UC Treatments / Results  Labs (all labs ordered are listed, but only abnormal results are displayed) Labs Reviewed  POCT URINALYSIS DIP (MANUAL ENTRY) - Abnormal; Notable for the following components:      Result Value   Clarity, UA cloudy (*)    Glucose, UA =500 (*)    Blood, UA large (*)  Protein Ur, POC =100 (*)    Leukocytes, UA Trace (*)    All other components within normal limits  URINE CULTURE    EKG   Radiology No results found.  Procedures Procedures (including critical care time)  Medications Ordered in UC Medications - No data to display  Initial Impression / Assessment and Plan / UC Course  I have reviewed the triage vital signs and the nursing notes.  Pertinent labs & imaging results that were available during my care of the patient were reviewed by me and considered in my medical decision making (see chart for details).     MDM   Pt reports cipro has worked best in the past.   Final Clinical Impressions(s) / UC Diagnoses   Final diagnoses:  Urinary tract infection with hematuria, site unspecified     Discharge Instructions     Return if any problems.    ED Prescriptions    Medication  Sig Dispense Auth. Provider   ciprofloxacin (CIPRO) 500 MG tablet Take 1 tablet (500 mg total) by mouth 2 (two) times daily for 10 days. 20 tablet Sofia, Leslie K, [place]   HYDROcodone-acetaminophen (NORCO/VICODIN) 5-325 MG tablet Take 1 tablet by mouth every 4 (four) hours as needed for moderate pain. 20 tablet Sofia, Leslie K, [place]   phenazopyridine (PYRIDIUM) 200 MG tablet Take 1 tablet (200 mg total) by mouth 3 (three) times daily as needed for pain. 20 tablet Fransico Meadow, [place]     Controlled Substance Prescriptions  Controlled Substance Registry consulted? Not Applicable   Fransico Meadow, [place] 09/23/18 1850

## 2018-09-23 NOTE — ED Notes (Signed)
Updated patient on delay

## 2018-09-23 NOTE — Discharge Instructions (Signed)
Return if any problems.

## 2018-09-23 NOTE — ED Notes (Signed)
Patient able to ambulate independently  

## 2018-09-23 NOTE — ED Triage Notes (Signed)
Pt presents to [facility] for assessment of bladder pressure, blood in her urine, and pain with urination since 4pm today.

## 2018-09-24 ENCOUNTER — Other Ambulatory Visit: Payer: Self-pay

## 2018-09-24 LAB — URINE CULTURE: Culture: NO GROWTH

## 2018-09-24 MED ORDER — TRULICITY 1.5 MG/0.5ML ~~LOC~~ SOAJ: 1.5000 mg | SUBCUTANEOUS | 4 refills | Status: DC

## 2018-10-31 ENCOUNTER — Telehealth: Payer: Self-pay | Admitting: Nurse Practitioner

## 2018-10-31 NOTE — Telephone Encounter (Signed)
I called and left message on voicemail to call office and schedule follow up appointment with [facility].

## 2018-11-18 ENCOUNTER — Other Ambulatory Visit: Payer: Self-pay | Admitting: Nurse Practitioner

## 2018-11-18 DIAGNOSIS — E876 Hypokalemia: Secondary | ICD-10-CM

## 2018-11-19 ENCOUNTER — Other Ambulatory Visit: Payer: Self-pay | Admitting: Nurse Practitioner

## 2018-11-19 DIAGNOSIS — E876 Hypokalemia: Secondary | ICD-10-CM

## 2018-11-19 DIAGNOSIS — E785 Hyperlipidemia, unspecified: Secondary | ICD-10-CM

## 2018-11-19 DIAGNOSIS — I10 Essential (primary) hypertension: Secondary | ICD-10-CM

## 2018-11-20 NOTE — Telephone Encounter (Signed)
Rx cancel from [facility] and resend the remaining to [facility].

## 2018-11-22 ENCOUNTER — Other Ambulatory Visit: Payer: Medicare Other

## 2018-11-25 ENCOUNTER — Ambulatory Visit: Payer: Medicare Other | Admitting: Endocrinology

## 2018-12-16 ENCOUNTER — Other Ambulatory Visit: Payer: Self-pay

## 2018-12-16 ENCOUNTER — Other Ambulatory Visit (INDEPENDENT_AMBULATORY_CARE_PROVIDER_SITE_OTHER): Payer: Medicare Other

## 2018-12-16 DIAGNOSIS — E1165 Type 2 diabetes mellitus with hyperglycemia: Secondary | ICD-10-CM | POA: Diagnosis not present

## 2018-12-16 LAB — COMPREHENSIVE METABOLIC PANEL
ALT: 25 U/L (ref 0–35)
AST: 25 U/L (ref 0–37)
Albumin: 4.4 g/dL (ref 3.5–5.2)
Alkaline Phosphatase: 44 U/L (ref 39–117)
BUN: 20 mg/dL (ref 6–23)
CO2: 30 mEq/L (ref 19–32)
Calcium: 9.8 mg/dL (ref 8.4–10.5)
Chloride: 98 mEq/L (ref 96–112)
Creatinine, Ser: 0.55 mg/dL (ref 0.40–1.20)
GFR: 108.23 mL/min (ref >60.00)
Glucose, Bld: 140 mg/dL — ABNORMAL HIGH (ref 70–99)
Potassium: 3.8 mEq/L (ref 3.5–5.1)
Sodium: 140 mEq/L (ref 135–145)
Total Bilirubin: 0.6 mg/dL (ref 0.2–1.2)
Total Protein: 6.8 g/dL (ref 6.0–8.3)

## 2018-12-16 LAB — HEMOGLOBIN A1C: Hgb A1c MFr Bld: 8.2 % — ABNORMAL HIGH (ref 4.6–6.5)

## 2018-12-20 ENCOUNTER — Ambulatory Visit (INDEPENDENT_AMBULATORY_CARE_PROVIDER_SITE_OTHER): Payer: Medicare Other | Admitting: Endocrinology

## 2018-12-20 ENCOUNTER — Other Ambulatory Visit: Payer: Self-pay

## 2018-12-20 ENCOUNTER — Encounter: Payer: Self-pay | Admitting: Endocrinology

## 2018-12-20 VITALS — BP 130/70 | HR 74 | Ht 68.5 in | Wt 182.0 lb

## 2018-12-20 DIAGNOSIS — E1165 Type 2 diabetes mellitus with hyperglycemia: Secondary | ICD-10-CM | POA: Diagnosis not present

## 2018-12-20 MED ORDER — ONETOUCH VERIO VI STRP: ORAL_STRIP | 12 refills | Status: DC

## 2018-12-20 MED ORDER — GLIMEPIRIDE 2 MG PO TABS: 2.0000 mg | ORAL_TABLET | Freq: Every day | ORAL | 3 refills | Status: DC

## 2018-12-20 NOTE — Patient Instructions (Addendum)
Check blood sugars on waking up 2-3 days a week  Also check blood sugars about 2 hours after meals and do this after different meals by rotation  Recommended blood sugar levels on waking up are 90-130 and about 2 hours after meal is 130-160  Please bring your blood sugar monitor to each visit, thank you  Lean meat, small portion (deck of cards or palm of hand). Half your plate should be non starchy vegetables. Vegetable plate with beans or boiled egg is fine  Aim for 30-45 grams per meal (2-3 carb choices) No candy  Change Glipizide to Glimeperide in am

## 2018-12-20 NOTE — Progress Notes (Signed)
Patient ID: Sandra Ray, female   DOB: [birth year], 73 y.o.   MRN: [id number]            Reason for Appointment: Follow-up for Type 2 Diabetes   History of Present Illness:          Date of diagnosis of type 2 diabetes mellitus: 1996       Background history:   She had a high blood sugar on lab work which diagnosed her diabetes and she was most likely treated with metformin for a few years She has also taking glipizide for several years She was tried on Actos which was started because of significant weight gain. Over the last few years she has been taking either  Januvia or Tradjenta with her metformin in combination with fair control Her A1c has been below 8% only twice in the last 2-3 years  Recent history:    Non-insulin hypoglycemic drugs: Glipizide ER 2.5 mg, Trulicity  1.5 mg weekly Xigduo 07/998, 1 tablet twice daily  Current management, blood sugar patterns and problems identified:  Her A1c continues to go up and now 8.2 compared to 7.8    She is getting Trulicity from the patient assistance program as before  However her blood sugars appear to be getting higher and is continuing to take 1.5 mg weekly without side effect  She however checks blood sugars only in the mornings and these are averaging about 115 indicating she likely has high postprandial readings  She takes Xigduo at lunch and bedtime and generally tries to remember to do this  Her blood sugar after breakfast is only slightly higher than pre-meal reading  However she is frequently eating out at lunchtime and does not plan her meals well  Also for various reasons is getting more carbohydrates at her meals and snacks   Lab glucose was 140 fasting Her weight has gone back up since end of June       Side effects from medications have been: Weight gain from Actos, nausea from high dose metformin   Glucose monitoring:  done <1 times a day         Glucometer: One Touch ultra 2.       Blood Glucose  readings by time of day from patient reading off her meter values:   PRE-MEAL Fasting Lunch Dinner Bedtime Overall  Glucose range:  140-168      Mean/median: 149    158   POST-MEAL PC Breakfast PC Lunch PC Dinner  Glucose range:  182, 218    Mean/median:      Previous readings:  PRE-MEAL Fasting Lunch Dinner Bedtime Overall  Glucose range:  130-141 125, 127  142    Mean/median:     ?   POST-MEAL PC Breakfast PC Lunch PC Dinner  Glucose range:  195   161  Mean/median:         Self-care: The diet that the patient has been following is: tries to limit fried food.     Typical meal intake: Breakfast is cereal, egg, almond milk,  lunch [place] burger and salad, dinner is soup and crackers and vegetables.  Snacks are usually small apple                Dietician visit, most recent: 07/13/16               Exercise:  Minimal  Weight history: Previous range 200-254  Wt Readings from Last 3 Encounters:  12/20/18 182 lb (82.6 kg)  09/06/18 177 lb 6.4 oz (80.5 kg)  08/26/18 182 lb (82.6 kg)    Glycemic control:   Lab Results  Component Value Date   HGBA1C 8.2 (H) 12/16/2018   HGBA1C 7.8 (H) 08/19/2018   HGBA1C 7.5 (H) 04/12/2018   Lab Results  Component Value Date   MICROALBUR 1.4 08/19/2018   LDLCALC 47 04/03/2018   CREATININE 0.55 12/16/2018   Lab Results  Component Value Date   MICRALBCREAT 1.1 08/19/2018    Lab Results  Component Value Date   FRUCTOSAMINE 263 05/14/2018   FRUCTOSAMINE 328 (H) 07/07/2016      Allergies as of 12/20/2018      Reactions   Penicillins    Causes yeast infections Did it involve swelling of the face/tongue/throat, SOB, or low BP? No Did it involve sudden or severe rash/hives, skin peeling, or any reaction on the inside of your mouth or nose? No Did you need to seek medical attention at a hospital or doctor's office? No When did it last happen?2018 If all above answers are "NO", may proceed with cephalosporin use.    Amoxicillin Other (See Comments)   Pt states it causes her a yeast infection  Causes yeast infections Did it involve swelling of the face/tongue/throat, SOB, or low BP? No Did it involve sudden or severe rash/hives, skin peeling, or any reaction on the inside of your mouth or nose? No Did you need to seek medical attention at a hospital or doctor's office? No When did it last happen?2018 If all above answers are "NO", may proceed with cephalosporin use.   Erythromycin    Passed out while taking      Medication List       Accurate as of December 20, 2018 10:07 AM. If you have any questions, ask your nurse or doctor.        acetaminophen 650 MG CR tablet Commonly known as: TYLENOL Take 650 mg by mouth every 8 (eight) hours as needed for pain.   atorvastatin 40 MG tablet Commonly known as: LIPITOR TAKE 1 TABLET BY MOUTH  DAILY AT 6 PM.   Coenzyme Q10 400 MG Caps Take 400 mg by mouth daily.   Dapagliflozin-metFORMIN HCl ER 07-998 MG Tb24 Commonly known as: Xigduo XR Take 1 tablet by mouth 2 (two) times daily. Take 1 tablet by mouth twice daily.   fluticasone 50 MCG/ACT nasal spray Commonly known as: FLONASE Place 2 sprays into both nostrils daily. What changed:   when to take this  reasons to take this   gabapentin 300 MG capsule Commonly known as: NEURONTIN Take 1 capsule (300 mg total) by mouth 3 (three) times daily.   Garlic 123XX123 MG Caps Take 1,000 mg by mouth daily.   glipiZIDE 2.5 MG 24 hr tablet Commonly known as: GLUCOTROL XL Take 1 tablet (2.5 mg total) by mouth daily with breakfast.   glucose blood test strip Commonly known as: ONE TOUCH ULTRA TEST Use as instructed once a day.  DX code E11.40   hydrochlorothiazide 25 MG tablet Commonly known as: HYDRODIURIL TAKE 1 TABLET BY MOUTH  DAILY   HYDROcodone-acetaminophen 5-325 MG tablet Commonly known as: NORCO/VICODIN Take 1 tablet by mouth every 4 (four) hours as needed for moderate pain.    lisinopril 5 MG tablet Commonly known as: ZESTRIL TAKE 1 TABLET BY MOUTH  DAILY   omega-3 acid ethyl esters 1 g capsule Commonly known as: LOVAZA Take 1 capsule (1 g total) by mouth 2 (two) times daily.  ondansetron 4 MG tablet Commonly known as: Zofran Take 1 tablet (4 mg total) by mouth as needed for nausea or vomiting.   onetouch ultrasoft lancets Use as instructed once day.  E11.40   oxybutynin 5 MG 24 hr tablet Commonly known as: DITROPAN-XL Take 1 tablet (5 mg total) by mouth at bedtime.   phenazopyridine 200 MG tablet Commonly known as: Pyridium Take 1 tablet (200 mg total) by mouth 3 (three) times daily as needed for pain.   potassium chloride SA 20 MEQ tablet Commonly known as: KLOR-CON TAKE 1 TABLET BY MOUTH  DAILY   traMADol 50 MG tablet Commonly known as: ULTRAM Take 1 tablet (50 mg total) by mouth every 6 (six) hours as needed.   Trulicity 1.5 0000000 Sopn Generic drug: Dulaglutide Inject 1.5 mg into the skin once a week.   Ventolin HFA 108 (90 Base) MCG/ACT inhaler Generic drug: albuterol Inhale 2 puffs into the lungs every 6 (six) hours as needed for wheezing or shortness of breath.   vitamin B-12 500 MCG tablet Commonly known as: CYANOCOBALAMIN Take 500 mcg by mouth daily.   Vitamin D-3 125 MCG (5000 UT) Tabs Take 5,000 Units by mouth daily.       Allergies:  Allergies  Allergen Reactions  . Penicillins     Causes yeast infections Did it involve swelling of the face/tongue/throat, SOB, or low BP? No Did it involve sudden or severe rash/hives, skin peeling, or any reaction on the inside of your mouth or nose? No Did you need to seek medical attention at a hospital or doctor's office? No When did it last happen?2018 If all above answers are "NO", may proceed with cephalosporin use.   Marland Kitchen Amoxicillin Other (See Comments)    Pt states it causes her a yeast infection  Causes yeast infections Did it involve swelling of the  face/tongue/throat, SOB, or low BP? No Did it involve sudden or severe rash/hives, skin peeling, or any reaction on the inside of your mouth or nose? No Did you need to seek medical attention at a hospital or doctor's office? No When did it last happen?2018 If all above answers are "NO", may proceed with cephalosporin use.  . Erythromycin     Passed out while taking    Past Medical History:  Diagnosis Date  . Arthritis   . Breast abscess 2000   right breast  . Carpal tunnel syndrome   . Complication of anesthesia   . Degenerative disc disease   . Diabetes mellitus type II   . Eczema of hand   . Hx of colonic polyps 12/02/2014   2004 - 2 diminutive polyps   . Hyperlipidemia   . Hypertension   . Metatarsal deformity   . Ovarian cyst 07/2017   left   . PONV (postoperative nausea and vomiting)   . Shingles 2000  . Tubular adenoma of colon     Past Surgical History:  Procedure Laterality Date  . ABDOMINAL HYSTERECTOMY  1981   menorrhagia  . ABSCESS DRAINAGE Right    breast  . APPENDECTOMY  1966   done with CSxn. Prophylactic  . BREAST BIOPSY Left   . carpel tunnel Right   . CESAREAN SECTION     x2  . COLONOSCOPY    . LUMBAR LAMINECTOMY     [id number]. 2011 L4,L5,S1  . Right rotator cuff repair 09/2015    . ROBOTIC ASSISTED SALPINGO OOPHERECTOMY Left 04/16/2018   Procedure: XI ROBOTIC ASSISTED  LEFT SALPINGO OOPHORECTOMY;  Surgeon: [name], MD;  Location: [facility];  Service: Gynecology;  Laterality: Left;  . TUBAL LIGATION      Family History  Problem Relation Age of Onset  . Hypertension Father   . Diabetes Mother   . Stroke Mother   . Hypertension Mother   . High Cholesterol Mother   . Heart disease Mother   . Diabetes Brother   . Breast cancer Daughter 97       unsure if did genetic testing  . Breast cancer Maternal Aunt   . Breast cancer Sister 45  . Colon polyps Brother     Social History:  reports that she has never smoked. She has never  used smokeless tobacco. She reports that she does not drink alcohol or use drugs.   Review of Systems   Lipid history: Has had high triglycerides and LDL, LDL below 100 on atorvastatin 40 mg  Recently not doing as well with diet  Triglycerides managed by her PCP with [facility], triglycerides however over 200 lately.  Lab Results  Component Value Date   CHOL 137 08/19/2018   CHOL 123 04/03/2018   CHOL 154 02/06/2018   Lab Results  Component Value Date   HDL 42.80 08/19/2018   HDL 43.40 04/03/2018   HDL 47.90 02/06/2018   Lab Results  Component Value Date   LDLCALC 47 04/03/2018   LDLCALC 72 11/23/2016   LDLCALC 81 05/22/2014   Lab Results  Component Value Date   TRIG 222.0 (H) 08/19/2018   TRIG 164.0 (H) 04/03/2018   TRIG 236.0 (H) 02/06/2018   Lab Results  Component Value Date   CHOLHDL 3 08/19/2018   CHOLHDL 3 04/03/2018   CHOLHDL 3 02/06/2018   Lab Results  Component Value Date   LDLDIRECT 69.0 08/19/2018   LDLDIRECT 80.0 02/06/2018   LDLDIRECT 91.0 08/22/2016            Hypertension:Mild and controlled with 5 mg Lisinopril and 25 mg HCTZ prescribed by  PCP  BP Readings from Last 3 Encounters:  12/20/18 130/70  09/23/18 (!) 143/74  09/06/18 132/76    Most recent eye exam was 10/19  Most recent foot exam: 08/2017 Has evidence of neuropathy on exam   LABS:  Lab on 12/16/2018  Component Date Value Ref Range Status  . Sodium 12/16/2018 140  135 - 145 mEq/L Final  . Potassium 12/16/2018 3.8  3.5 - 5.1 mEq/L Final  . Chloride 12/16/2018 98  96 - 112 mEq/L Final  . CO2 12/16/2018 30  19 - 32 mEq/L Final  . Glucose, Bld 12/16/2018 140* 70 - 99 mg/dL Final  . BUN 12/16/2018 20  6 - 23 mg/dL Final  . Creatinine, Ser 12/16/2018 0.55  0.40 - 1.20 mg/dL Final  . Total Bilirubin 12/16/2018 0.6  0.2 - 1.2 mg/dL Final  . Alkaline Phosphatase 12/16/2018 44  39 - 117 U/L Final  . AST 12/16/2018 25  0 - 37 U/L Final  . ALT 12/16/2018 25  0 - 35 U/L Final  .  Total Protein 12/16/2018 6.8  6.0 - 8.3 g/dL Final  . Albumin 12/16/2018 4.4  3.5 - 5.2 g/dL Final  . Calcium 12/16/2018 9.8  8.4 - 10.5 mg/dL Final  . GFR 12/16/2018 108.23  >60.00 mL/min Final  . Hgb A1c MFr Bld 12/16/2018 8.2* 4.6 - 6.5 % Final   Glycemic Control Guidelines for People with Diabetes:Non Diabetic:  <6%Goal of Therapy: <7%Additional Action Suggested:  >8%  Physical Examination:  BP 130/70 (BP Location: Left Arm, Patient Position: Sitting, Cuff Size: Normal)   Pulse 74   Ht 5' 8.5" (1.74 m)   Wt 182 lb (82.6 kg)   SpO2 97%   BMI 27.27 kg/m   ASSESSMENT:  Diabetes type 2, BMI 28  See history of present illness for detailed discussion of current diabetes management, blood sugar patterns and problems identified   Her A1c is relatively higher at 8.2  She is on multiple medications including Trulicity, Xigduo and low-dose glipizide  Blood sugars are not well controlled likely to be from poor diet Also not able to exercise much May benefit from a more potent GLP-1 like Ozempic although she currently has a large supply of Trulicity Not monitoring after meals currently especially after lunch and supper  HYPERTENSION: Blood pressure is controlled  Be checked on her follow-up visit  Lipids: Will need follow-up    PLAN:   One Touch Verio monitor has been given to replace her ultra 2 Again reminded her to check readings consistently after meals and not as much in the morning She will be given a trial of Amaryl 2 mg in the morning and stop glipizide to help daytime blood sugars She will try to cut back on portions, carbohydrates and high-fat meals Exercise as much as possible She will be tried on Ozempic if she can qualify for the patient assistance program Showed her how the injection device works compared to [employer] and likely she can work up to the 1 mg dose eventually Meanwhile continue 1.5 mg Trulicity weekly She will follow-up in 2 months to make sure  her sugars are better controlled     There are no Patient Instructions on file for this visit.       Elayne Snare 12/20/2018, 10:07 AM   Note: This office note was prepared with Dragon voice recognition system technology. Any transcriptional errors that result from this process are unintentional.

## 2018-12-26 ENCOUNTER — Other Ambulatory Visit: Payer: Self-pay

## 2018-12-26 MED ORDER — ONETOUCH VERIO VI STRP: ORAL_STRIP | 12 refills | Status: DC

## 2019-01-07 DIAGNOSIS — E119 Type 2 diabetes mellitus without complications: Secondary | ICD-10-CM | POA: Diagnosis not present

## 2019-01-16 ENCOUNTER — Telehealth: Payer: Self-pay

## 2019-01-16 ENCOUNTER — Other Ambulatory Visit: Payer: Self-pay

## 2019-01-16 MED ORDER — TRULICITY 3 MG/0.5ML ~~LOC~~ SOAJ: 3.0000 mg | SUBCUTANEOUS | 2 refills | Status: DC

## 2019-01-16 NOTE — Telephone Encounter (Signed)
Received a fax from [employer] pt assistance foundation stating that the pt was denied for the patient assistance because she has private prescription coverage.  How would you like to proceed?

## 2019-01-16 NOTE — Telephone Encounter (Signed)
I believe she is getting Trulicity from the [employer]  She will now start taking 3 mg weekly using 2 shots of the 1.5 and then get the 3 mg injector

## 2019-01-16 NOTE — Telephone Encounter (Signed)
Called pt and gave her MD message. PT verbalized understanding. 

## 2019-01-16 NOTE — Telephone Encounter (Signed)
New updated Rx printed for MD signature. Once signed, it will be faxed to [facility] Patient Assistance foundation.

## 2019-01-24 ENCOUNTER — Other Ambulatory Visit: Payer: Self-pay

## 2019-01-24 MED ORDER — XIGDUO XR 5-1000 MG PO TB24: 1.0000 | ORAL_TABLET | Freq: Two times a day (BID) | ORAL | 4 refills | Status: DC

## 2019-01-24 MED ORDER — TRULICITY 3 MG/0.5ML ~~LOC~~ SOAJ: 3.0000 mg | SUBCUTANEOUS | 2 refills | Status: DC

## 2019-02-03 DIAGNOSIS — H25012 Cortical age-related cataract, left eye: Secondary | ICD-10-CM | POA: Diagnosis not present

## 2019-02-20 ENCOUNTER — Other Ambulatory Visit: Payer: Medicare Other

## 2019-02-25 ENCOUNTER — Telehealth: Payer: Self-pay

## 2019-02-25 ENCOUNTER — Ambulatory Visit: Payer: Medicare Other | Admitting: Endocrinology

## 2019-02-25 NOTE — Telephone Encounter (Signed)
Received fax from [employer] stating that the patient's application has been approved and enrollment will end on 03/12/2020.

## 2019-02-26 ENCOUNTER — Telehealth: Payer: Self-pay

## 2019-02-26 NOTE — Telephone Encounter (Signed)
Received fax from [employer] stating that the patient application will need to be renewed for the 2021 calendar year. A new application was printed, highlighted where pt needs to fill out and/or sign and mailed with instructions to complete and then bring back to our office. 

## 2019-03-28 ENCOUNTER — Other Ambulatory Visit (INDEPENDENT_AMBULATORY_CARE_PROVIDER_SITE_OTHER): Payer: Medicare Other

## 2019-03-28 ENCOUNTER — Other Ambulatory Visit: Payer: Self-pay

## 2019-03-28 DIAGNOSIS — E1165 Type 2 diabetes mellitus with hyperglycemia: Secondary | ICD-10-CM | POA: Diagnosis not present

## 2019-03-28 LAB — BASIC METABOLIC PANEL
BUN: 18 mg/dL (ref 6–23)
CO2: 29 mEq/L (ref 19–32)
Calcium: 9.9 mg/dL (ref 8.4–10.5)
Chloride: 97 mEq/L (ref 96–112)
Creatinine, Ser: 0.59 mg/dL (ref 0.40–1.20)
GFR: 99.73 mL/min (ref >60.00)
Glucose, Bld: 111 mg/dL — ABNORMAL HIGH (ref 70–99)
Potassium: 4.3 mEq/L (ref 3.5–5.1)
Sodium: 138 mEq/L (ref 135–145)

## 2019-03-29 LAB — FRUCTOSAMINE: Fructosamine: 237 umol/L (ref 0–285)

## 2019-03-31 ENCOUNTER — Ambulatory Visit: Payer: Medicare Other | Admitting: Endocrinology

## 2019-04-01 ENCOUNTER — Other Ambulatory Visit: Payer: Self-pay | Admitting: Nurse Practitioner

## 2019-04-01 DIAGNOSIS — Z1231 Encounter for screening mammogram for malignant neoplasm of breast: Secondary | ICD-10-CM

## 2019-04-07 ENCOUNTER — Ambulatory Visit: Payer: Medicare Other | Admitting: Endocrinology

## 2019-04-07 ENCOUNTER — Encounter: Payer: Self-pay | Admitting: Endocrinology

## 2019-04-07 ENCOUNTER — Other Ambulatory Visit: Payer: Self-pay

## 2019-04-07 VITALS — BP 132/68 | HR 79 | Ht 68.5 in | Wt 176.2 lb

## 2019-04-07 DIAGNOSIS — I10 Essential (primary) hypertension: Secondary | ICD-10-CM | POA: Diagnosis not present

## 2019-04-07 DIAGNOSIS — E1142 Type 2 diabetes mellitus with diabetic polyneuropathy: Secondary | ICD-10-CM | POA: Diagnosis not present

## 2019-04-07 DIAGNOSIS — E1165 Type 2 diabetes mellitus with hyperglycemia: Secondary | ICD-10-CM | POA: Diagnosis not present

## 2019-04-07 DIAGNOSIS — E785 Hyperlipidemia, unspecified: Secondary | ICD-10-CM

## 2019-04-07 LAB — POCT GLYCOSYLATED HEMOGLOBIN (HGB A1C): Hemoglobin A1C: 6.6 % — AB (ref 4.0–5.6)

## 2019-04-07 MED ORDER — ONDANSETRON HCL 4 MG PO TABS: 4.0000 mg | ORAL_TABLET | ORAL | 0 refills | Status: DC | PRN

## 2019-04-07 NOTE — Progress Notes (Signed)
Patient ID: Sandra Ray, female   DOB: 02/08/1946, 74 y.o.   MRN: [id number]            Reason for Appointment: Follow-up for Type 2 Diabetes   History of Present Illness:          Date of diagnosis of type 2 diabetes mellitus: 1996       Background history:   She had a high blood sugar on lab work which diagnosed her diabetes and she was most likely treated with metformin for a few years She has also taking glipizide for several years She was tried on Actos which was started because of significant weight gain. Over the last few years she has been taking either  Januvia or Tradjenta with her metformin in combination with fair control Her A1c has been below 8% only twice in the last 2-3 years  Recent history:    Non-insulin hypoglycemic drugs: Amaryl 2 mg in the morning, Trulicity  3 mg weekly Xigduo 07/998, 1 tablet twice daily  Current management, blood sugar patterns and problems identified:  Her A1c has improved markedly from 8.2 down to 6.6 now   She is getting Trulicity 3 mg Trulicity since her last visit  Also her glipizide was changed to Amaryl 2 mg daily  She has however started to eat healthier meals and not eating out as much  Also her overall portion control is better with increasing Trulicity on most days; also no nausea with this  She also has lost 6 pounds  Blood sugars are fairly good throughout the day with only 1 unusual high reading of 196 in the afternoon likely from higher carbohydrate intake  She has limited ability to walk because of hip pain but is trying to be as active as possible   Lab glucose was 111 fasting  Most of her glucose monitoring is in the mornings and not very much after meals        Side effects from medications have been: Weight gain from Actos, nausea from high dose metformin   Glucose monitoring:  done <1 times a day         Glucometer: One Touch Verio     Blood Glucose readings by time of day from patient reading off her  meter values:   PRE-MEAL  mornings Lunch Dinner Bedtime Overall  Glucose range:  101-134  151, 169  155  112   Mean/median:  114.    126   POST-MEAL PC Breakfast PC Lunch PC Dinner  Glucose range:   121, 196   Mean/median:      PREVIOUS readings:   PRE-MEAL Fasting Lunch Dinner Bedtime Overall  Glucose range:  140-168      Mean/median: 149    158   POST-MEAL PC Breakfast PC Lunch PC Dinner  Glucose range:  182, 218    Mean/median:        Self-care: The diet that the patient has been following is: tries to limit fried food.     Typical meal intake: Breakfast is cereal, egg, almond milk,  lunch [place] burger and salad, dinner is soup and crackers and vegetables.  Snacks are usually small apple                Dietician visit, most recent: 07/13/16               Exercise:  Minimal  Weight history: Previous range 200-254  Wt Readings from Last 3 Encounters:  04/07/19 176 lb  3.2 oz (79.9 kg)  12/20/18 182 lb (82.6 kg)  09/06/18 177 lb 6.4 oz (80.5 kg)    Glycemic control:   Lab Results  Component Value Date   HGBA1C 6.6 (A) 04/07/2019   HGBA1C 8.2 (H) 12/16/2018   HGBA1C 7.8 (H) 08/19/2018   Lab Results  Component Value Date   MICROALBUR 1.4 08/19/2018   LDLCALC 47 04/03/2018   CREATININE 0.59 03/28/2019   Lab Results  Component Value Date   MICRALBCREAT 1.1 08/19/2018    Lab Results  Component Value Date   FRUCTOSAMINE 237 03/28/2019   FRUCTOSAMINE 263 05/14/2018   FRUCTOSAMINE 328 (H) 07/07/2016      Allergies as of 04/07/2019      Reactions   Penicillins    Causes yeast infections Did it involve swelling of the face/tongue/throat, SOB, or low BP? No Did it involve sudden or severe rash/hives, skin peeling, or any reaction on the inside of your mouth or nose? No Did you need to seek medical attention at a hospital or doctor's office? No When did it last happen?2018 If all above answers are "NO", may proceed with cephalosporin use.    Amoxicillin Other (See Comments)   Pt states it causes her a yeast infection  Causes yeast infections Did it involve swelling of the face/tongue/throat, SOB, or low BP? No Did it involve sudden or severe rash/hives, skin peeling, or any reaction on the inside of your mouth or nose? No Did you need to seek medical attention at a hospital or doctor's office? No When did it last happen?2018 If all above answers are "NO", may proceed with cephalosporin use.   Erythromycin    Passed out while taking      Medication List       Accurate as of April 07, 2019  8:53 PM. If you have any questions, ask your nurse or doctor.        STOP taking these medications   glipiZIDE 2.5 MG 24 hr tablet Commonly known as: GLUCOTROL XL Stopped by: Elayne Snare, MD     TAKE these medications   acetaminophen 650 MG CR tablet Commonly known as: TYLENOL Take 650 mg by mouth every 8 (eight) hours as needed for pain.   atorvastatin 40 MG tablet Commonly known as: LIPITOR TAKE 1 TABLET BY MOUTH  DAILY AT 6 PM.   Coenzyme Q10 400 MG Caps Take 400 mg by mouth daily.   fluticasone 50 MCG/ACT nasal spray Commonly known as: FLONASE Place 2 sprays into both nostrils daily. What changed:   when to take this  reasons to take this   gabapentin 300 MG capsule Commonly known as: NEURONTIN Take 1 capsule (300 mg total) by mouth 3 (three) times daily.   Garlic 123XX123 MG Caps Take 1,000 mg by mouth daily.   glimepiride 2 MG tablet Commonly known as: AMARYL Take 1 tablet (2 mg total) by mouth daily before breakfast.   hydrochlorothiazide 25 MG tablet Commonly known as: HYDRODIURIL TAKE 1 TABLET BY MOUTH  DAILY   HYDROcodone-acetaminophen 5-325 MG tablet Commonly known as: NORCO/VICODIN Take 1 tablet by mouth every 4 (four) hours as needed for moderate pain.   lisinopril 5 MG tablet Commonly known as: ZESTRIL TAKE 1 TABLET BY MOUTH  DAILY   omega-3 acid ethyl esters 1 g capsule Commonly  known as: LOVAZA Take 1 capsule (1 g total) by mouth 2 (two) times daily.   ondansetron 4 MG tablet Commonly known as: Zofran Take 1 tablet (4  mg total) by mouth as needed for nausea or vomiting.   onetouch ultrasoft lancets Use as instructed once day.  E11.40   OneTouch Verio test strip Generic drug: glucose blood Use Onetouch verio test strips as instructed to check blood sugar twice daily.   oxybutynin 5 MG 24 hr tablet Commonly known as: DITROPAN-XL Take 1 tablet (5 mg total) by mouth at bedtime.   phenazopyridine 200 MG tablet Commonly known as: Pyridium Take 1 tablet (200 mg total) by mouth 3 (three) times daily as needed for pain.   potassium chloride SA 20 MEQ tablet Commonly known as: KLOR-CON TAKE 1 TABLET BY MOUTH  DAILY   traMADol 50 MG tablet Commonly known as: ULTRAM Take 1 tablet (50 mg total) by mouth every 6 (six) hours as needed.   Trulicity 3 0000000 Sopn Generic drug: Dulaglutide Inject 3 mg into the skin once a week. Inject 3mg  under the skin once weekly.   Ventolin HFA 108 (90 Base) MCG/ACT inhaler Generic drug: albuterol Inhale 2 puffs into the lungs every 6 (six) hours as needed for wheezing or shortness of breath.   vitamin B-12 500 MCG tablet Commonly known as: CYANOCOBALAMIN Take 500 mcg by mouth daily.   Vitamin D-3 125 MCG (5000 UT) Tabs Take 5,000 Units by mouth daily.   Xigduo XR 07-998 MG Tb24 Generic drug: Dapagliflozin-metFORMIN HCl ER Take 1 tablet by mouth 2 (two) times daily. Take 1 tablet by mouth twice daily.       Allergies:  Allergies  Allergen Reactions  . Penicillins     Causes yeast infections Did it involve swelling of the face/tongue/throat, SOB, or low BP? No Did it involve sudden or severe rash/hives, skin peeling, or any reaction on the inside of your mouth or nose? No Did you need to seek medical attention at a hospital or doctor's office? No When did it last happen?2018 If all above answers are  "NO", may proceed with cephalosporin use.   Marland Kitchen Amoxicillin Other (See Comments)    Pt states it causes her a yeast infection  Causes yeast infections Did it involve swelling of the face/tongue/throat, SOB, or low BP? No Did it involve sudden or severe rash/hives, skin peeling, or any reaction on the inside of your mouth or nose? No Did you need to seek medical attention at a hospital or doctor's office? No When did it last happen?2018 If all above answers are "NO", may proceed with cephalosporin use.  . Erythromycin     Passed out while taking    Past Medical History:  Diagnosis Date  . Arthritis   . Breast abscess 2000   right breast  . Carpal tunnel syndrome   . Complication of anesthesia   . Degenerative disc disease   . Diabetes mellitus type II   . Eczema of hand   . Hx of colonic polyps 12/02/2014   2004 - 2 diminutive polyps   . Hyperlipidemia   . Hypertension   . Metatarsal deformity   . Ovarian cyst 07/2017   left   . PONV (postoperative nausea and vomiting)   . Shingles 2000  . Tubular adenoma of colon     Past Surgical History:  Procedure Laterality Date  . ABDOMINAL HYSTERECTOMY  1981   menorrhagia  . ABSCESS DRAINAGE Right    breast  . APPENDECTOMY  1966   done with CSxn. Prophylactic  . BREAST BIOPSY Left   . carpel tunnel Right   . CESAREAN SECTION  x2  . COLONOSCOPY    . LUMBAR LAMINECTOMY     [id number]. 2011 L4,L5,S1  . Right rotator cuff repair 09/2015    . ROBOTIC ASSISTED SALPINGO OOPHERECTOMY Left 04/16/2018   Procedure: XI ROBOTIC ASSISTED  LEFT SALPINGO OOPHORECTOMY;  Surgeon: [name], MD;  Location: [facility];  Service: Gynecology;  Laterality: Left;  . TUBAL LIGATION      Family History  Problem Relation Age of Onset  . Hypertension Father   . Diabetes Mother   . Stroke Mother   . Hypertension Mother   . High Cholesterol Mother   . Heart disease Mother   . Diabetes Brother   . Breast cancer Daughter 45       unsure  if did genetic testing  . Breast cancer Maternal Aunt   . Breast cancer Sister 30  . Colon polyps Brother     Social History:  reports that she has never smoked. She has never used smokeless tobacco. She reports that she does not drink alcohol or use drugs.   Review of Systems   Lipid history: Has had high triglycerides and LDL, LDL below 100 on atorvastatin 40 mg  Triglycerides managed by her PCP with [facility], triglycerides however over 200 on last measurement.  Lab Results  Component Value Date   CHOL 137 08/19/2018   CHOL 123 04/03/2018   CHOL 154 02/06/2018   Lab Results  Component Value Date   HDL 42.80 08/19/2018   HDL 43.40 04/03/2018   HDL 47.90 02/06/2018   Lab Results  Component Value Date   LDLCALC 47 04/03/2018   LDLCALC 72 11/23/2016   LDLCALC 81 05/22/2014   Lab Results  Component Value Date   TRIG 222.0 (H) 08/19/2018   TRIG 164.0 (H) 04/03/2018   TRIG 236.0 (H) 02/06/2018   Lab Results  Component Value Date   CHOLHDL 3 08/19/2018   CHOLHDL 3 04/03/2018   CHOLHDL 3 02/06/2018   Lab Results  Component Value Date   LDLDIRECT 69.0 08/19/2018   LDLDIRECT 80.0 02/06/2018   LDLDIRECT 91.0 08/22/2016            Hypertension: Consistently controlled with 5 mg Lisinopril and 25 mg HCTZ No hypokalemia with using potassium supplements  BP Readings from Last 3 Encounters:  04/07/19 132/68  12/20/18 130/70  09/23/18 (!) 143/74   Lab Results  Component Value Date   K 4.3 03/28/2019     Most recent eye exam was 10/19  Most recent foot exam: 08/2017 Has evidence of neuropathy on exam   LABS:  Office Visit on 04/07/2019  Component Date Value Ref Range Status  . Hemoglobin A1C 04/07/2019 6.6* 4.0 - 5.6 % Final    Physical Examination:  BP 132/68 (BP Location: Left Arm, Patient Position: Sitting, Cuff Size: Normal)   Pulse 79   Ht 5' 8.5" (1.74 m)   Wt 176 lb 3.2 oz (79.9 kg)   SpO2 98%   BMI 26.40 kg/m   ASSESSMENT:  Diabetes type  2, non-insulin-dependent  See history of present illness for detailed discussion of current diabetes management, blood sugar patterns and problems identified   Her A1c is much better at 6.6  She is on multiple medications including Trulicity, Xigduo and low-dose Amaryl  Blood sugars and her weight have improved significantly with increasing her Trulicity This is likely to be from her being able to do better with her diet and eating out less Also overall blood sugar may be better with using  Amaryl 2 mg instead of glipizide ER 2.5 mg Fasting readings are better also She has not done enough readings after meals but most of her blood sugars are still looking fairly good She will continue the same regimen  HYPERTENSION: Blood pressure is consistently controlled as also potassium   Lipids: Will need follow-up fasting labs with her PCP    PLAN:   She needs to continue improving her diet consistently More readings after meals at home Encourage her to be as active as possible. No change in Trulicity and she can continue to try and get this from the patient assistance program  No change in antihypertensives Consider reducing dose of HCTZ especially since she is taking Farxiga  Follow-up in 3 months     There are no Patient Instructions on file for this visit.       Elayne Snare 04/07/2019, 8:53 PM   Note: This office note was prepared with Dragon voice recognition system technology. Any transcriptional errors that result from this process are unintentional.

## 2019-04-10 NOTE — Telephone Encounter (Signed)
Pt came into office yesterday for visit with MD and requested an update on her application for Trulicity. Pt was informed that I would call and inquire about this and get back to her. [employer] representative named Elizebeth Brooking stated that the providers information sections was not completed, however, after reviewing the application the provider information is on the application. To suffice [employer] and to expedite the process and ensure pt does not go without medication, this portion of the application (page 5) was faxed again. Elizebeth Brooking suggested that approx 1 hour after faxing, call and verify that it was received and then she will forward it to whomever takes over at that point and they will begin verifying the application to make sure everything is handled in a timely manner henceforth.

## 2019-04-10 NOTE — Telephone Encounter (Signed)
LATE ENTRY FOR [id number] -New application for patient assistance was completed and faxed on this date.

## 2019-04-11 NOTE — Telephone Encounter (Signed)
Received fax from [employer] P[employer] stating that the pt does meet eligibility requirements and she has been enrolled in the program until the end of the calendar year.  [id number] Will call pt and notify her.

## 2019-04-15 NOTE — Telephone Encounter (Signed)
Called pt and notified her of the information below. Pt verbalized understanding.

## 2019-04-16 ENCOUNTER — Other Ambulatory Visit: Payer: Self-pay | Admitting: Nurse Practitioner

## 2019-04-16 DIAGNOSIS — I10 Essential (primary) hypertension: Secondary | ICD-10-CM

## 2019-04-22 ENCOUNTER — Other Ambulatory Visit: Payer: Self-pay | Admitting: Nurse Practitioner

## 2019-04-22 DIAGNOSIS — E876 Hypokalemia: Secondary | ICD-10-CM

## 2019-04-25 ENCOUNTER — Other Ambulatory Visit: Payer: Self-pay | Admitting: Nurse Practitioner

## 2019-04-25 DIAGNOSIS — E785 Hyperlipidemia, unspecified: Secondary | ICD-10-CM

## 2019-05-08 ENCOUNTER — Ambulatory Visit
Admission: RE | Admit: 2019-05-08 | Discharge: 2019-05-08 | Disposition: A | Discharge status: Home / Self Care | Payer: Medicare Other | Source: Ambulatory Visit | Attending: Nurse Practitioner | Admitting: Nurse Practitioner

## 2019-05-08 ENCOUNTER — Other Ambulatory Visit: Payer: Self-pay

## 2019-05-08 DIAGNOSIS — Z1231 Encounter for screening mammogram for malignant neoplasm of breast: Secondary | ICD-10-CM | POA: Diagnosis not present

## 2019-05-12 ENCOUNTER — Other Ambulatory Visit: Payer: Self-pay | Admitting: Nurse Practitioner

## 2019-05-12 DIAGNOSIS — E1151 Type 2 diabetes mellitus with diabetic peripheral angiopathy without gangrene: Secondary | ICD-10-CM

## 2019-06-27 ENCOUNTER — Other Ambulatory Visit (INDEPENDENT_AMBULATORY_CARE_PROVIDER_SITE_OTHER): Payer: Medicare Other

## 2019-06-27 ENCOUNTER — Other Ambulatory Visit: Payer: Self-pay

## 2019-06-27 DIAGNOSIS — E785 Hyperlipidemia, unspecified: Secondary | ICD-10-CM | POA: Diagnosis not present

## 2019-06-27 DIAGNOSIS — E1142 Type 2 diabetes mellitus with diabetic polyneuropathy: Secondary | ICD-10-CM

## 2019-06-27 LAB — COMPREHENSIVE METABOLIC PANEL
ALT: 24 U/L (ref 0–35)
AST: 24 U/L (ref 0–37)
Albumin: 4.3 g/dL (ref 3.5–5.2)
Alkaline Phosphatase: 49 U/L (ref 39–117)
BUN: 16 mg/dL (ref 6–23)
CO2: 27 mEq/L (ref 19–32)
Calcium: 9.8 mg/dL (ref 8.4–10.5)
Chloride: 99 mEq/L (ref 96–112)
Creatinine, Ser: 0.62 mg/dL (ref 0.40–1.20)
GFR: 94.12 mL/min (ref >60.00)
Glucose, Bld: 121 mg/dL — ABNORMAL HIGH (ref 70–99)
Potassium: 4 mEq/L (ref 3.5–5.1)
Sodium: 138 mEq/L (ref 135–145)
Total Bilirubin: 0.4 mg/dL (ref 0.2–1.2)
Total Protein: 6.8 g/dL (ref 6.0–8.3)

## 2019-06-27 LAB — MICROALBUMIN / CREATININE URINE RATIO
Creatinine,U: 92.8 mg/dL
Microalb Creat Ratio: 1.7 mg/g (ref 0.0–30.0)
Microalb, Ur: 1.6 mg/dL (ref 0.0–1.9)

## 2019-06-27 LAB — LIPID PANEL
Cholesterol: 120 mg/dL (ref 0–200)
HDL: 43.9 mg/dL (ref >39.00)
LDL Cholesterol: 48 mg/dL (ref 0–99)
NonHDL: 76.42
Total CHOL/HDL Ratio: 3
Triglycerides: 144 mg/dL (ref 0.0–149.0)
VLDL: 28.8 mg/dL (ref 0.0–40.0)

## 2019-06-27 LAB — HEMOGLOBIN A1C: Hgb A1c MFr Bld: 7.1 % — ABNORMAL HIGH (ref 4.6–6.5)

## 2019-07-01 ENCOUNTER — Other Ambulatory Visit: Payer: Self-pay

## 2019-07-03 ENCOUNTER — Encounter: Payer: Self-pay | Admitting: Endocrinology

## 2019-07-03 ENCOUNTER — Ambulatory Visit: Payer: Medicare Other | Admitting: Endocrinology

## 2019-07-03 ENCOUNTER — Other Ambulatory Visit: Payer: Self-pay

## 2019-07-03 VITALS — BP 134/70 | HR 85 | Ht 68.0 in | Wt 176.4 lb

## 2019-07-03 DIAGNOSIS — E785 Hyperlipidemia, unspecified: Secondary | ICD-10-CM | POA: Diagnosis not present

## 2019-07-03 DIAGNOSIS — E1165 Type 2 diabetes mellitus with hyperglycemia: Secondary | ICD-10-CM

## 2019-07-03 DIAGNOSIS — I10 Essential (primary) hypertension: Secondary | ICD-10-CM

## 2019-07-03 NOTE — Patient Instructions (Addendum)
Check blood sugars on waking up 2 days a week  Also check blood sugars about 2 hours after meals and do this after different meals by rotation  Recommended blood sugar levels on waking up are 90-130 and about 2 hours after meal is 130-160  Please bring your blood sugar monitor to each visit, thank you  More walking  Xigduo at [employer] and dinner

## 2019-07-03 NOTE — Progress Notes (Signed)
Patient ID: Sandra Ray, female   DOB: 01-15-1946, 74 y.o.   MRN: [id number]            Reason for Appointment: Follow-up for Type 2 Diabetes   History of Present Illness:          Date of diagnosis of type 2 diabetes mellitus: 1996       Background history:   She had a high blood sugar on lab work which diagnosed her diabetes and she was most likely treated with metformin for a few years She has also taking glipizide for several years She was tried on Actos which was started because of significant weight gain. Over the last few years she has been taking either  Januvia or Tradjenta with her metformin in combination with fair control Her A1c has been below 8% only twice in the last 2-3 years  Recent history:    Non-insulin hypoglycemic drugs: Amaryl 2 mg in the morning, Trulicity  3 mg weekly,  Xigduo 07/998, 1 tablet twice daily  Current management, blood sugar patterns and problems identified:  Her A1c has gone back up slightly at 7.1 compared to 6.6   She is not sure why her A1c has gone up  Blood sugars are not being checked after meals  Morning sugars are only slightly higher than on her last visit when she was having readings as high as 196 postprandially  Getting Trulicity 3 mg Trulicity as before and this had previously helped her control  Weight has leveled off  She is likely not consistently watching her diet  Has not missed any doses of her medications or injections  On her own she has taken Xigduo at lunch and bedtime and not at breakfast and dinner as discussed previously  She will occasionally get diarrhea  She has limited ability to walk because of hip pain but is doing better with this now   Lab glucose was 121 fasting  No hypoglycemia symptoms during the day with Amaryl        Side effects from medications have been: Weight gain from Actos, nausea from high dose metformin   Glucose monitoring:  done <1 times a day         Glucometer: One  Touch Verio     Blood Glucose readings by time of day from monitor download  Blood sugar range 73-130, MEDIAN 122 Most blood sugars being checked in the mornings Evening reading 126  Previous readings:  PRE-MEAL  mornings Lunch Dinner Bedtime Overall  Glucose range:  101-134  151, 169  155  112   Mean/median:  114.    126   POST-MEAL PC Breakfast PC Lunch PC Dinner  Glucose range:   121, 196   Mean/median:       Self-care: The diet that the patient has been following is: tries to limit fried food.     Typical meal intake: Breakfast is cereal, egg, almond milk,  lunch [place] burger and salad, dinner is soup and crackers and vegetables.  Snacks are usually small apple                Dietician visit, most recent: 07/13/16               Exercise:  Minimal  Weight history: Previous range 200-254  Wt Readings from Last 3 Encounters:  07/03/19 176 lb 6.4 oz (80 kg)  04/07/19 176 lb 3.2 oz (79.9 kg)  12/20/18 182 lb (82.6 kg)    Glycemic  control:   Lab Results  Component Value Date   HGBA1C 7.1 (H) 06/27/2019   HGBA1C 6.6 (A) 04/07/2019   HGBA1C 8.2 (H) 12/16/2018   Lab Results  Component Value Date   MICROALBUR 1.6 06/27/2019   LDLCALC 48 06/27/2019   CREATININE 0.62 06/27/2019   Lab Results  Component Value Date   MICRALBCREAT 1.7 06/27/2019    Lab Results  Component Value Date   FRUCTOSAMINE 237 03/28/2019   FRUCTOSAMINE 263 05/14/2018   FRUCTOSAMINE 328 (H) 07/07/2016      Allergies as of 07/03/2019      Reactions   Penicillins    Causes yeast infections Did it involve swelling of the face/tongue/throat, SOB, or low BP? No Did it involve sudden or severe rash/hives, skin peeling, or any reaction on the inside of your mouth or nose? No Did you need to seek medical attention at a hospital or doctor's office? No When did it last happen?2018 If all above answers are "NO", may proceed with cephalosporin use.   Amoxicillin Other (See Comments)   Pt  states it causes her a yeast infection  Causes yeast infections Did it involve swelling of the face/tongue/throat, SOB, or low BP? No Did it involve sudden or severe rash/hives, skin peeling, or any reaction on the inside of your mouth or nose? No Did you need to seek medical attention at a hospital or doctor's office? No When did it last happen?2018 If all above answers are "NO", may proceed with cephalosporin use.   Erythromycin    Passed out while taking      Medication List       Accurate as of July 03, 2019  8:52 PM. If you have any questions, ask your nurse or doctor.        acetaminophen 650 MG CR tablet Commonly known as: TYLENOL Take 650 mg by mouth every 8 (eight) hours as needed for pain.   atorvastatin 40 MG tablet Commonly known as: LIPITOR Take 1 tablet (40 mg total) by mouth daily at 6 PM. Need office visit for additional refills   Coenzyme Q10 400 MG Caps Take 400 mg by mouth daily.   fluticasone 50 MCG/ACT nasal spray Commonly known as: FLONASE Place 2 sprays into both nostrils daily. What changed:   when to take this  reasons to take this   gabapentin 300 MG capsule Commonly known as: NEURONTIN TAKE 1 CAPSULE BY MOUTH 3  TIMES DAILY   Garlic 123XX123 MG Caps Take 1,000 mg by mouth daily.   glimepiride 2 MG tablet Commonly known as: AMARYL Take 1 tablet (2 mg total) by mouth daily before breakfast.   hydrochlorothiazide 25 MG tablet Commonly known as: HYDRODIURIL TAKE 1 TABLET BY MOUTH  DAILY   HYDROcodone-acetaminophen 5-325 MG tablet Commonly known as: NORCO/VICODIN Take 1 tablet by mouth every 4 (four) hours as needed for moderate pain.   lisinopril 5 MG tablet Commonly known as: ZESTRIL TAKE 1 TABLET BY MOUTH  DAILY   omega-3 acid ethyl esters 1 g capsule Commonly known as: LOVAZA Take 1 capsule (1 g total) by mouth 2 (two) times daily.   ondansetron 4 MG tablet Commonly known as: Zofran Take 1 tablet (4 mg total) by mouth as  needed for nausea or vomiting.   onetouch ultrasoft lancets Use as instructed once day.  E11.40   OneTouch Verio test strip Generic drug: glucose blood Use Onetouch verio test strips as instructed to check blood sugar twice daily.   oxybutynin  5 MG 24 hr tablet Commonly known as: DITROPAN-XL Take 1 tablet (5 mg total) by mouth at bedtime.   phenazopyridine 200 MG tablet Commonly known as: Pyridium Take 1 tablet (200 mg total) by mouth 3 (three) times daily as needed for pain.   potassium chloride SA 20 MEQ tablet Commonly known as: KLOR-CON TAKE 1 TABLET BY MOUTH  DAILY   traMADol 50 MG tablet Commonly known as: ULTRAM Take 1 tablet (50 mg total) by mouth every 6 (six) hours as needed.   Trulicity 3 0000000 Sopn Generic drug: Dulaglutide Inject 3 mg into the skin once a week. Inject 3mg  under the skin once weekly.   Ventolin HFA 108 (90 Base) MCG/ACT inhaler Generic drug: albuterol Inhale 2 puffs into the lungs every 6 (six) hours as needed for wheezing or shortness of breath.   vitamin B-12 500 MCG tablet Commonly known as: CYANOCOBALAMIN Take 500 mcg by mouth daily.   Vitamin D-3 125 MCG (5000 UT) Tabs Take 5,000 Units by mouth daily.   Xigduo XR 07-998 MG Tb24 Generic drug: Dapagliflozin-metFORMIN HCl ER Take 1 tablet by mouth 2 (two) times daily. Take 1 tablet by mouth twice daily.       Allergies:  Allergies  Allergen Reactions  . Penicillins     Causes yeast infections Did it involve swelling of the face/tongue/throat, SOB, or low BP? No Did it involve sudden or severe rash/hives, skin peeling, or any reaction on the inside of your mouth or nose? No Did you need to seek medical attention at a hospital or doctor's office? No When did it last happen?2018 If all above answers are "NO", may proceed with cephalosporin use.   Marland Kitchen Amoxicillin Other (See Comments)    Pt states it causes her a yeast infection  Causes yeast infections Did it involve  swelling of the face/tongue/throat, SOB, or low BP? No Did it involve sudden or severe rash/hives, skin peeling, or any reaction on the inside of your mouth or nose? No Did you need to seek medical attention at a hospital or doctor's office? No When did it last happen?2018 If all above answers are "NO", may proceed with cephalosporin use.  . Erythromycin     Passed out while taking    Past Medical History:  Diagnosis Date  . Arthritis   . Breast abscess 2000   right breast  . Carpal tunnel syndrome   . Complication of anesthesia   . Degenerative disc disease   . Diabetes mellitus type II   . Eczema of hand   . Hx of colonic polyps 12/02/2014   2004 - 2 diminutive polyps   . Hyperlipidemia   . Hypertension   . Metatarsal deformity   . Ovarian cyst 07/2017   left   . PONV (postoperative nausea and vomiting)   . Shingles 2000  . Tubular adenoma of colon     Past Surgical History:  Procedure Laterality Date  . ABDOMINAL HYSTERECTOMY  1981   menorrhagia  . ABSCESS DRAINAGE Right    breast  . APPENDECTOMY  1966   done with CSxn. Prophylactic  . BREAST BIOPSY Left   . carpel tunnel Right   . CESAREAN SECTION     x2  . COLONOSCOPY    . LUMBAR LAMINECTOMY     [id number]. 2011 L4,L5,S1  . Right rotator cuff repair 09/2015    . ROBOTIC ASSISTED SALPINGO OOPHERECTOMY Left 04/16/2018   Procedure: XI ROBOTIC ASSISTED  LEFT SALPINGO OOPHORECTOMY;  Surgeon: Gerarda Fraction,  Francella Solian, MD;  Location: [facility];  Service: Gynecology;  Laterality: Left;  . TUBAL LIGATION      Family History  Problem Relation Age of Onset  . Hypertension Father   . Diabetes Mother   . Stroke Mother   . Hypertension Mother   . High Cholesterol Mother   . Heart disease Mother   . Diabetes Brother   . Breast cancer Daughter 70       unsure if did genetic testing  . Breast cancer Maternal Aunt   . Breast cancer Sister 55  . Colon polyps Brother     Social History:  reports that she has never smoked.  She has never used smokeless tobacco. She reports that she does not drink alcohol or use drugs.   Review of Systems   Lipid history: Has had high triglycerides and LDL, LDL below 100 on atorvastatin 40 mg  Triglycerides managed by her PCP with Lovaza, triglycerides are now back to normal   Lab Results  Component Value Date   CHOL 120 06/27/2019   CHOL 137 08/19/2018   CHOL 123 04/03/2018   Lab Results  Component Value Date   HDL 43.90 06/27/2019   HDL 42.80 08/19/2018   HDL 43.40 04/03/2018   Lab Results  Component Value Date   LDLCALC 48 06/27/2019   LDLCALC 47 04/03/2018   LDLCALC 72 11/23/2016   Lab Results  Component Value Date   TRIG 144.0 06/27/2019   TRIG 222.0 (H) 08/19/2018   TRIG 164.0 (H) 04/03/2018   Lab Results  Component Value Date   CHOLHDL 3 06/27/2019   CHOLHDL 3 08/19/2018   CHOLHDL 3 04/03/2018   Lab Results  Component Value Date   LDLDIRECT 69.0 08/19/2018   LDLDIRECT 80.0 02/06/2018   LDLDIRECT 91.0 08/22/2016            Hypertension: Consistently controlled with 5 mg Lisinopril and 25 mg HCTZ   BP Readings from Last 3 Encounters:  07/03/19 134/70  04/07/19 132/68  12/20/18 130/70   Lab Results  Component Value Date   K 4.0 06/27/2019     Most recent eye exam was 10/20    LABS:  Lab on 06/27/2019  Component Date Value Ref Range Status  . Cholesterol 06/27/2019 120  0 - 200 mg/dL Final   ATP III Classification       Desirable:  < 200 mg/dL               Borderline High:  200 - 239 mg/dL          High:  > = 240 mg/dL  . Triglycerides 06/27/2019 144.0  0.0 - 149.0 mg/dL Final   Normal:  <150 mg/dLBorderline High:  150 - 199 mg/dL  . HDL 06/27/2019 43.90  >39.00 mg/dL Final  . VLDL 06/27/2019 28.8  0.0 - 40.0 mg/dL Final  . LDL Cholesterol 06/27/2019 48  0 - 99 mg/dL Final  . Total CHOL/HDL Ratio 06/27/2019 3   Final                  Men          Women1/2 Average Risk     3.4          3.3Average Risk          5.0           4.42X Average Risk          9.6  7.13X Average Risk          15.0          11.0                      . NonHDL 06/27/2019 76.42   Final   NOTE:  Non-HDL goal should be 30 mg/dL higher than patient's LDL goal (i.e. LDL goal of < 70 mg/dL, would have non-HDL goal of < 100 mg/dL)  . Microalb, Ur 06/27/2019 1.6  0.0 - 1.9 mg/dL Final  . Creatinine,U 06/27/2019 92.8  mg/dL Final  . Microalb Creat Ratio 06/27/2019 1.7  0.0 - 30.0 mg/g Final  . Sodium 06/27/2019 138  135 - 145 mEq/L Final  . Potassium 06/27/2019 4.0  3.5 - 5.1 mEq/L Final  . Chloride 06/27/2019 99  96 - 112 mEq/L Final  . CO2 06/27/2019 27  19 - 32 mEq/L Final  . Glucose, Bld 06/27/2019 121* 70 - 99 mg/dL Final  . BUN 06/27/2019 16  6 - 23 mg/dL Final  . Creatinine, Ser 06/27/2019 0.62  0.40 - 1.20 mg/dL Final  . Total Bilirubin 06/27/2019 0.4  0.2 - 1.2 mg/dL Final  . Alkaline Phosphatase 06/27/2019 49  39 - 117 U/L Final  . AST 06/27/2019 24  0 - 37 U/L Final  . ALT 06/27/2019 24  0 - 35 U/L Final  . Total Protein 06/27/2019 6.8  6.0 - 8.3 g/dL Final  . Albumin 06/27/2019 4.3  3.5 - 5.2 g/dL Final  . GFR 06/27/2019 94.12  >60.00 mL/min Final  . Calcium 06/27/2019 9.8  8.4 - 10.5 mg/dL Final  . Hgb A1c MFr Bld 06/27/2019 7.1* 4.6 - 6.5 % Final   Glycemic Control Guidelines for People with Diabetes:Non Diabetic:  <6%Goal of Therapy: <7%Additional Action Suggested:  >8%     Physical Examination:  BP 134/70 (BP Location: Left Arm, Patient Position: Sitting, Cuff Size: Normal)   Pulse 85   Ht 5\' 8"  (1.727 m)   Wt 176 lb 6.4 oz (80 kg)   SpO2 97%   BMI 26.82 kg/m    ASSESSMENT:  Diabetes type 2, non-insulin-dependent  See history of present illness for detailed discussion of current diabetes management, blood sugar patterns and problems identified   Her A1c is starting to go up at 7.1  She is on multiple medications including Trulicity, Xigduo and low-dose Amaryl  She is probably still having some  postprandial hyperglycemia since A1c is higher and yet her morning blood sugars are averaging only about 120 Previously had benefited from increasing Trulicity Blood sugar monitoring is mostly in the mornings again  HYPERTENSION: Blood pressure is consistently controlled Normal microalbumin  Lipids: Overall with previous weight loss and generally better diet her triglycerides are below 150 now Taking Lovaza for this as well as atorvastatin   PLAN:   She needs to start checking blood sugars after lunch and dinner and less in the morning She will need to cut back on portions lower carbohydrates if she is finding blood sugars high after certain meals or snacks Since her early hip pain is better she can try to be more active and do some walking on a daily basis  Consider increasing Trulicity if [id number] continues to stay high Change Xigduo to breakfast and dinnertime   Follow-up in 3 months     Patient Instructions  Check blood sugars on waking up 2 days a week  Also check blood sugars about 2 hours after meals  and do this after different meals by rotation  Recommended blood sugar levels on waking up are 90-130 and about 2 hours after meal is 130-160  Please bring your blood sugar monitor to each visit, thank you  More walking  Xigduo at [facility] and dinner        Elayne Snare 07/03/2019, 8:52 PM   Note: This office note was prepared with Dragon voice recognition system technology. Any transcriptional errors that result from this process are unintentional.

## 2019-08-18 ENCOUNTER — Other Ambulatory Visit: Payer: Self-pay | Admitting: Nurse Practitioner

## 2019-08-18 DIAGNOSIS — E876 Hypokalemia: Secondary | ICD-10-CM

## 2019-08-18 DIAGNOSIS — I10 Essential (primary) hypertension: Secondary | ICD-10-CM

## 2019-08-29 ENCOUNTER — Other Ambulatory Visit: Payer: Self-pay | Admitting: Nurse Practitioner

## 2019-08-29 DIAGNOSIS — I10 Essential (primary) hypertension: Secondary | ICD-10-CM

## 2019-08-29 DIAGNOSIS — E785 Hyperlipidemia, unspecified: Secondary | ICD-10-CM

## 2019-09-03 ENCOUNTER — Other Ambulatory Visit: Payer: Self-pay | Admitting: Nurse Practitioner

## 2019-09-03 DIAGNOSIS — E876 Hypokalemia: Secondary | ICD-10-CM

## 2019-09-03 DIAGNOSIS — I10 Essential (primary) hypertension: Secondary | ICD-10-CM

## 2019-09-20 IMAGING — MR MR PELVIS WO/W CM
5 of 10 series · 21 of 48 positions shown · IV contrast (multihance)
Comparison: Ultrasound exam 08/31/2017

CLINICAL DATA: Left adnexal cyst.

EXAM:
MRI PELVIS WITHOUT AND WITH CONTRAST
TECHNIQUE: Multiplanar multisequence MR imaging of the pelvis was performed
both before and after administration of intravenous contrast.
CONTRAST:  15mL MULTIHANCE GADOBENATE DIMEGLUMINE 529 MG/ML IV SOLN

[Series 3: T2 · coronal · 6.0mm · 1.19mm/px · 5 of 29 slices shown (1 of 3)]
[im 1/29]
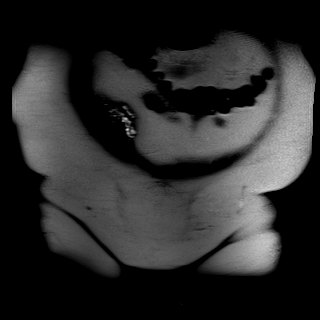
[im 8/29]
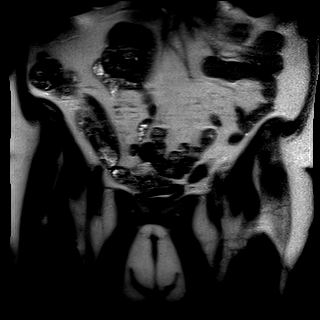
[im 15/29]
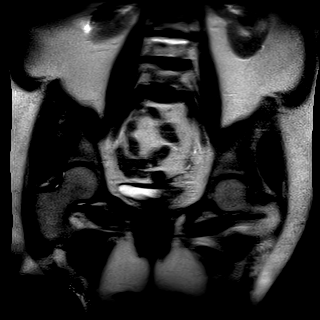
[im 22/29]
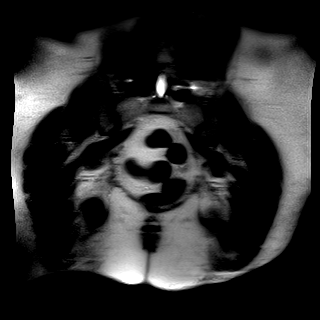
[im 29/29]
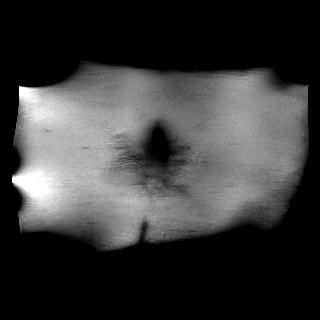

[Series 4: T2 · axial · 5.0mm · 0.44mm/px · z∈[-67,+100]mm · 5 of 28 slices shown (2 of 3)]
[im 1/28]
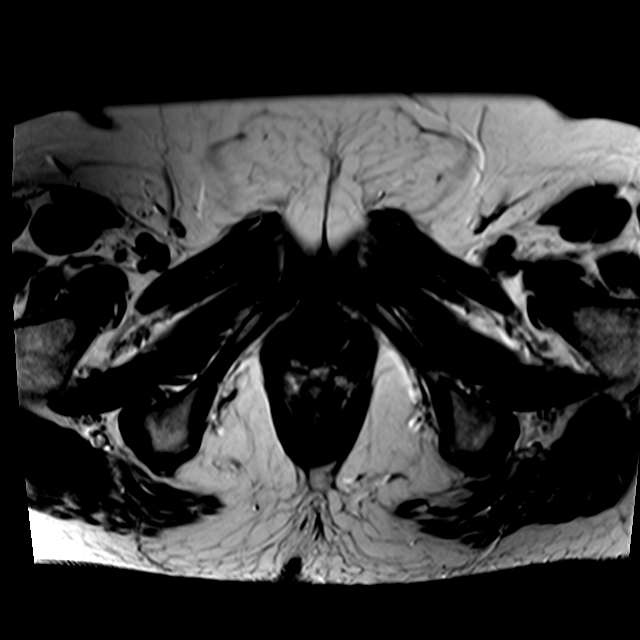
[im 7/28]
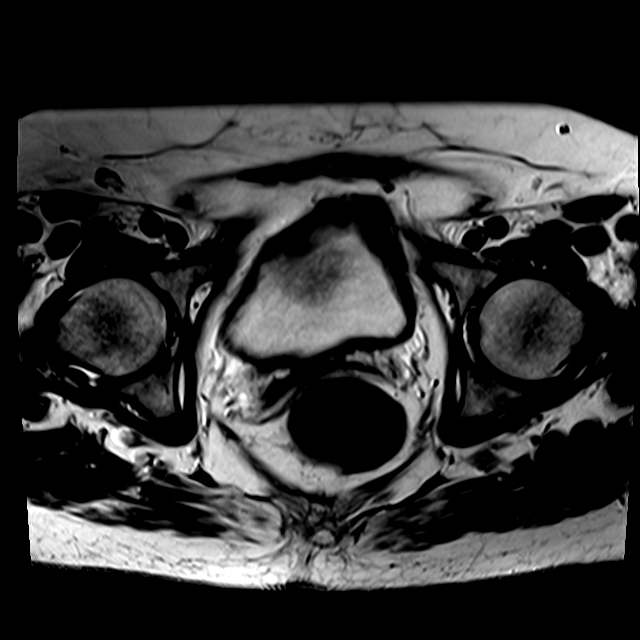
[im 14/28]
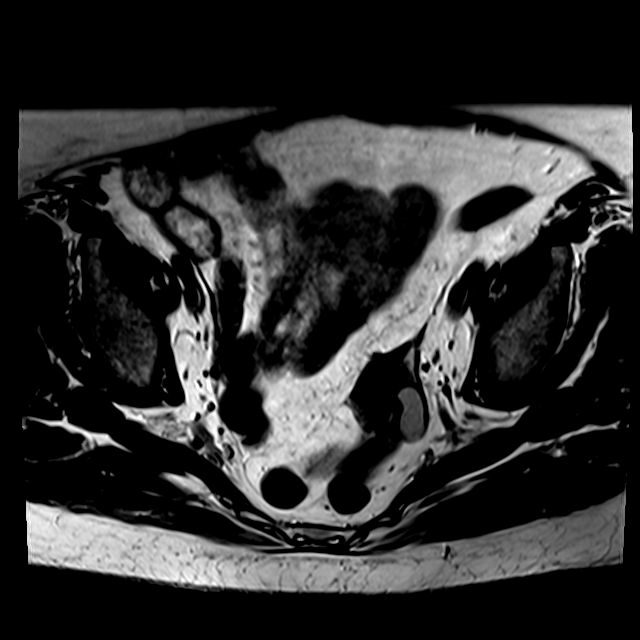
[im 21/28]
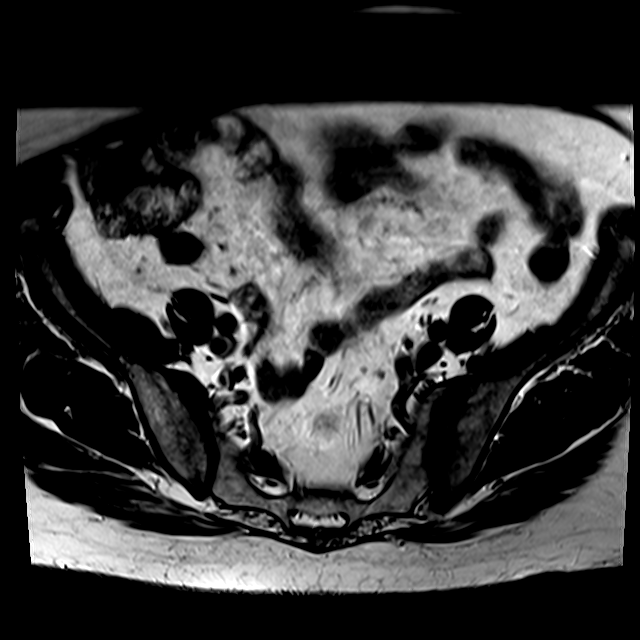
[im 28/28]
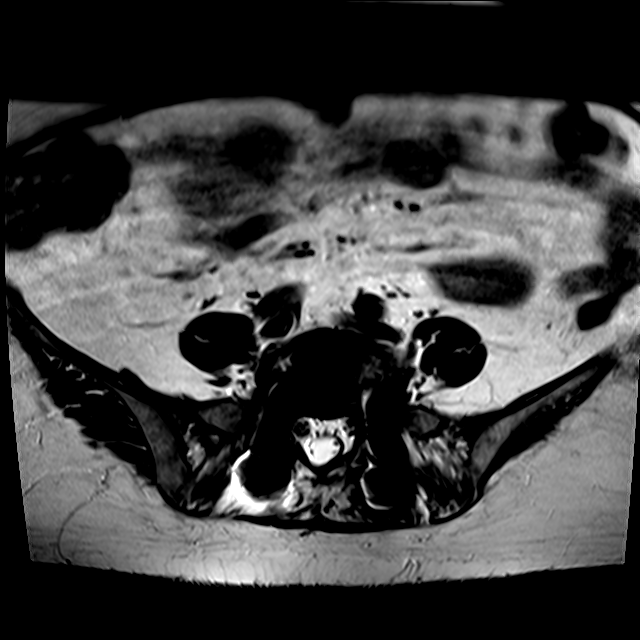

[Series 5: T2 fat-sat · axial · 5.0mm · 0.44mm/px · z∈[-67,+100]mm · 5 of 28 slices shown]
[im 1/28]
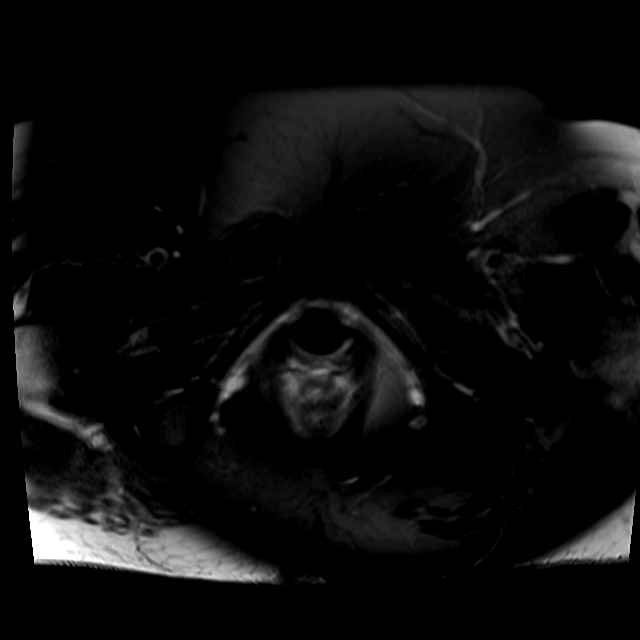
[im 7/28]
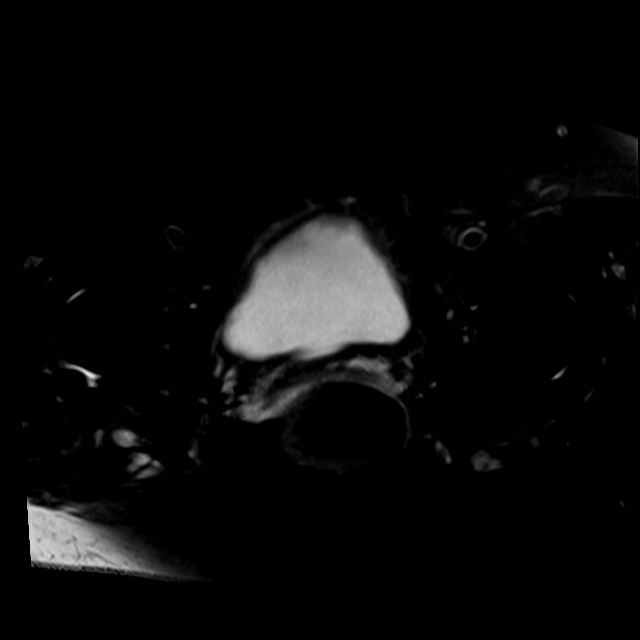
[im 14/28]
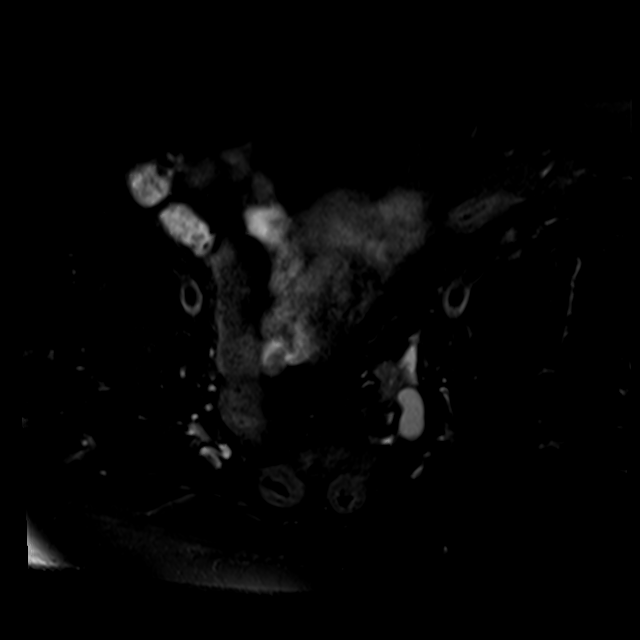
[im 21/28]
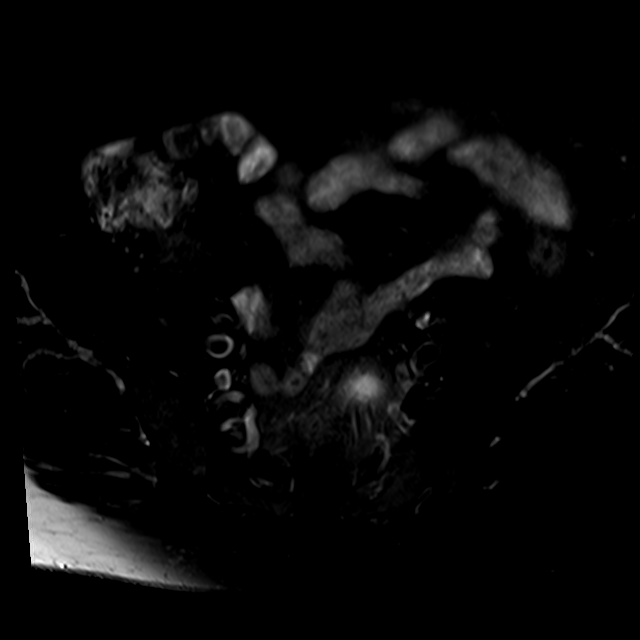
[im 28/28]
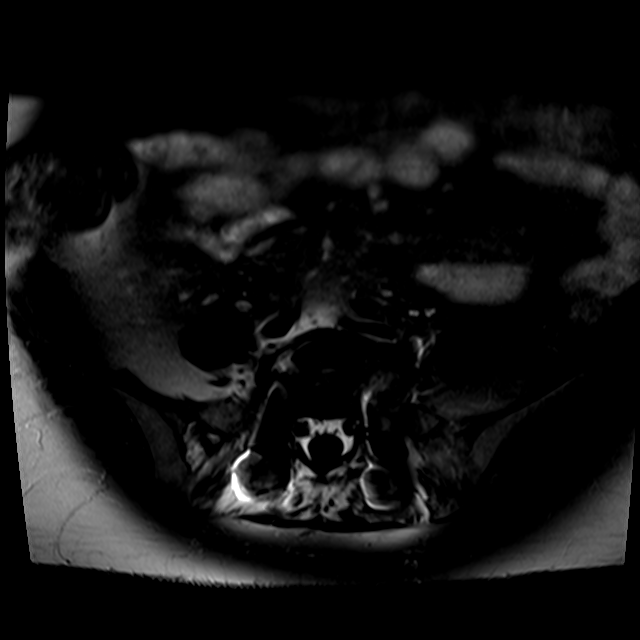

[Series 6: T2 · sagittal · 5.0mm · 0.88mm/px · 4 of 22 slices shown (3 of 3)]
[im 1/22]
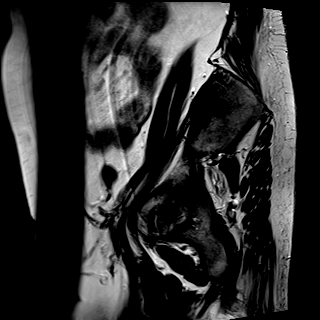
[im 8/22]
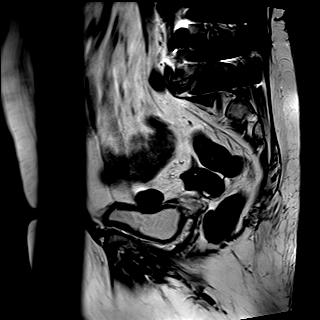
[im 15/22]
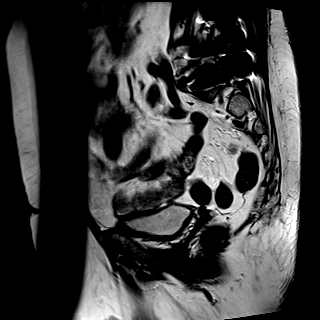
[im 22/22]
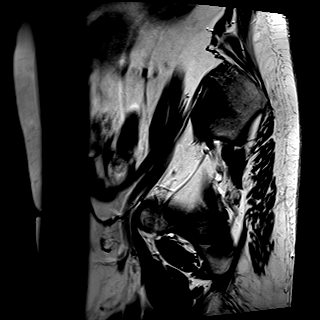

[Series 7: T1 · axial · 5.0mm · 0.88mm/px · z∈[-70,-28]mm · 2 of 30 slices shown]
[im 1/30]
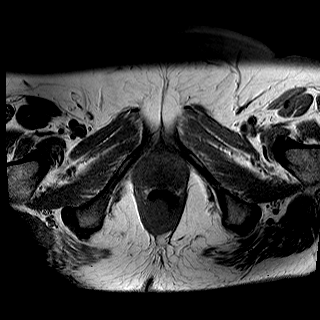
[im 8/30]
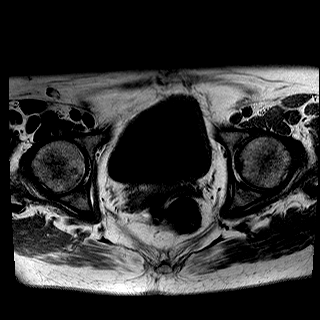

[21 of 48 positions shown; findings below may reference images not displayed]

FINDINGS: Urinary Tract: Bladder unremarkable. No evidence for urethral
diverticulum.

Bowel:  No small bowel or colonic dilatation within the pelvis.

Vascular/Lymphatic: No pathologically enlarged lymph nodes. No
significant vascular abnormality seen.

Reproductive:  Uterus surgically absent.

Right ovary not discretely visualized.

Left ovary measures 3.1 x 4.6 x 3.2 cm. Multiple cystic areas of
varying size and signal intensity are identified within the
parenchyma. Dominant cystic component measures 2.1 cm and long axis
on today's study. Areas of parenchymal enhancement are seen within
the parenchyma.

Other:  No intraperitoneal free fluid.

Musculoskeletal: Lumbosacral fusion.
IMPRESSION: 1. Left ovary contains multiple small foci of varying size and
signal intensity. Largest focus measures 2.1 cm today. These cannot
be definitively characterized but no overtly suspicious appearance
of the left ovary on today's study. Follow-up MRI in 3 months could
be used to ensure stability.
2. Nonvisualization right ovary without right adnexal mass.
3. Uterus surgically absent.

## 2019-09-22 ENCOUNTER — Other Ambulatory Visit: Payer: Self-pay | Admitting: Endocrinology

## 2019-10-07 ENCOUNTER — Other Ambulatory Visit (INDEPENDENT_AMBULATORY_CARE_PROVIDER_SITE_OTHER): Payer: Medicare Other

## 2019-10-07 ENCOUNTER — Other Ambulatory Visit: Payer: Self-pay

## 2019-10-07 DIAGNOSIS — E1165 Type 2 diabetes mellitus with hyperglycemia: Secondary | ICD-10-CM | POA: Diagnosis not present

## 2019-10-07 LAB — BASIC METABOLIC PANEL
BUN: 16 mg/dL (ref 6–23)
CO2: 29 mEq/L (ref 19–32)
Calcium: 10.1 mg/dL (ref 8.4–10.5)
Chloride: 100 mEq/L (ref 96–112)
Creatinine, Ser: 0.58 mg/dL (ref 0.40–1.20)
GFR: 101.57 mL/min (ref >60.00)
Glucose, Bld: 116 mg/dL — ABNORMAL HIGH (ref 70–99)
Potassium: 3.9 mEq/L (ref 3.5–5.1)
Sodium: 137 mEq/L (ref 135–145)

## 2019-10-07 LAB — HEMOGLOBIN A1C: Hgb A1c MFr Bld: 6.6 % — ABNORMAL HIGH (ref 4.6–6.5)

## 2019-10-09 ENCOUNTER — Encounter: Payer: Self-pay | Admitting: Endocrinology

## 2019-10-09 ENCOUNTER — Other Ambulatory Visit: Payer: Self-pay

## 2019-10-09 ENCOUNTER — Ambulatory Visit (INDEPENDENT_AMBULATORY_CARE_PROVIDER_SITE_OTHER): Payer: Medicare Other | Admitting: Endocrinology

## 2019-10-09 VITALS — BP 132/70 | HR 72 | Ht 68.0 in | Wt 172.0 lb

## 2019-10-09 DIAGNOSIS — E1165 Type 2 diabetes mellitus with hyperglycemia: Secondary | ICD-10-CM | POA: Diagnosis not present

## 2019-10-09 DIAGNOSIS — E1142 Type 2 diabetes mellitus with diabetic polyneuropathy: Secondary | ICD-10-CM

## 2019-10-09 DIAGNOSIS — E782 Mixed hyperlipidemia: Secondary | ICD-10-CM | POA: Diagnosis not present

## 2019-10-09 NOTE — Progress Notes (Signed)
Patient ID: Sandra Ray, female   DOB: 12-20-1945, 73 y.o.   MRN: [id number]            Reason for Appointment: Follow-up    History of Present Illness:          Date of diagnosis of type 2 diabetes mellitus: 1996       Background history:   She had a high blood sugar on lab work which diagnosed her diabetes and she was most likely treated with metformin for a few years She has also taking glipizide for several years She was tried on Actos which was started because of significant weight gain. Over the last few years she has been taking either  Januvia or Tradjenta with her metformin in combination with fair control Her A1c has been below 8% only twice in the last 2-3 years  Recent history:    Non-insulin hypoglycemic drugs: Amaryl 2 mg in the morning, Trulicity  3 mg weekly,  Xigduo 07/998, 1 tablet twice daily  Current management, blood sugar patterns and problems identified:  Her A1c has gone back down to 6.6 compared to 7.1   Blood sugars at home are relatively better but occasionally as high as 192 after breakfast  However at breakfast she will sometimes eat banana sandwiches or other high carbohydrate foods  She has taken Xigduo insulin doses instead of together because of some tendency to diarrhea with this  No nausea with 3 mg Trulicity  She has limited ability to walk  However her weight is down 4 pounds with continued Trulicity  Has only 2 readings after dinner recently and these appear to be fairly good   Lab glucose was 121 fasting  No hypoglycemic symptoms and lowest blood sugar is 87        Side effects from medications have been: Weight gain from Actos, nausea from high dose metformin   Glucose monitoring:  done <1 times a day         Glucometer: One Touch Verio     Blood Glucose readings by time of day from monitor download   PRE-MEAL Fasting Lunch Dinner Bedtime Overall  Glucose range:  87-124  168  101    median:      115   POST-MEAL PC  Breakfast PC Lunch PC Dinner  Glucose range:  133-192   157, 136  Mean/median:      Previous readings:  Blood sugar range 73-130, MEDIAN 122 Most blood sugars being checked in the mornings Evening reading 126   Self-care: The diet that the patient has been following is: tries to limit fried food.     Typical meal intake: Breakfast is cereal, egg, almond milk,  lunch [place] burger and salad, dinner is soup and crackers and vegetables.  Snacks are usually small apple                Dietician visit, most recent: 07/13/16               Exercise:  Minimal  Weight history: Previous range 200-254  Wt Readings from Last 3 Encounters:  10/09/19 172 lb (78 kg)  07/03/19 176 lb 6.4 oz (80 kg)  04/07/19 176 lb 3.2 oz (79.9 kg)    Glycemic control:   Lab Results  Component Value Date   HGBA1C 6.6 (H) 10/07/2019   HGBA1C 7.1 (H) 06/27/2019   HGBA1C 6.6 (A) 04/07/2019   Lab Results  Component Value Date   MICROALBUR 1.6 06/27/2019  LDLCALC 48 06/27/2019   CREATININE 0.58 10/07/2019   Lab Results  Component Value Date   MICRALBCREAT 1.7 06/27/2019    Lab Results  Component Value Date   FRUCTOSAMINE 237 03/28/2019   FRUCTOSAMINE 263 05/14/2018   FRUCTOSAMINE 328 (H) 07/07/2016      Allergies as of 10/09/2019      Reactions   Penicillins    Causes yeast infections Did it involve swelling of the face/tongue/throat, SOB, or low BP? No Did it involve sudden or severe rash/hives, skin peeling, or any reaction on the inside of your mouth or nose? No Did you need to seek medical attention at a hospital or doctor's office? No When did it last happen?2018 If all above answers are "NO", may proceed with cephalosporin use.   Amoxicillin Other (See Comments)   Pt states it causes her a yeast infection  Causes yeast infections Did it involve swelling of the face/tongue/throat, SOB, or low BP? No Did it involve sudden or severe rash/hives, skin peeling, or any reaction on the  inside of your mouth or nose? No Did you need to seek medical attention at a hospital or doctor's office? No When did it last happen?2018 If all above answers are "NO", may proceed with cephalosporin use.   Erythromycin    Passed out while taking      Medication List       Accurate as of October 09, 2019  9:16 PM. If you have any questions, ask your nurse or doctor.        acetaminophen 650 MG CR tablet Commonly known as: TYLENOL Take 650 mg by mouth every 8 (eight) hours as needed for pain.   atorvastatin 40 MG tablet Commonly known as: LIPITOR Take 1 tablet (40 mg total) by mouth daily at 6 PM. Need office visit for additional refills   Coenzyme Q10 400 MG Caps Take 400 mg by mouth daily.   fluticasone 50 MCG/ACT nasal spray Commonly known as: FLONASE Place 2 sprays into both nostrils daily. What changed:   when to take this  reasons to take this   gabapentin 300 MG capsule Commonly known as: NEURONTIN TAKE 1 CAPSULE BY MOUTH 3  TIMES DAILY   Garlic 5956 MG Caps Take 1,000 mg by mouth daily.   glimepiride 2 MG tablet Commonly known as: AMARYL TAKE 1 TABLET BY MOUTH  DAILY BEFORE BREAKFAST   hydrochlorothiazide 25 MG tablet Commonly known as: HYDRODIURIL Take 1 tablet (25 mg total) by mouth daily. Need office visit for additional refills   lisinopril 5 MG tablet Commonly known as: ZESTRIL TAKE 1 TABLET BY MOUTH  DAILY   omega-3 acid ethyl esters 1 g capsule Commonly known as: LOVAZA Take 1 capsule (1 g total) by mouth 2 (two) times daily.   ondansetron 4 MG tablet Commonly known as: Zofran Take 1 tablet (4 mg total) by mouth as needed for nausea or vomiting.   onetouch ultrasoft lancets Use as instructed once day.  E11.40   OneTouch Verio test strip Generic drug: glucose blood Use Onetouch verio test strips as instructed to check blood sugar twice daily.   oxybutynin 5 MG 24 hr tablet Commonly known as: DITROPAN-XL Take 1 tablet (5 mg total)  by mouth at bedtime.   potassium chloride SA 20 MEQ tablet Commonly known as: KLOR-CON Take 1 tablet (20 mEq total) by mouth daily. Need office visit for additional refills   traMADol 50 MG tablet Commonly known as: ULTRAM Take 1 tablet (50  mg total) by mouth every 6 (six) hours as needed.   Trulicity 3 JX/9.1YN Sopn Generic drug: Dulaglutide Inject 3 mg into the skin once a week. Inject 3mg  under the skin once weekly.   Ventolin HFA 108 (90 Base) MCG/ACT inhaler Generic drug: albuterol Inhale 2 puffs into the lungs every 6 (six) hours as needed for wheezing or shortness of breath.   vitamin B-12 500 MCG tablet Commonly known as: CYANOCOBALAMIN Take 500 mcg by mouth daily.   Vitamin D-3 125 MCG (5000 UT) Tabs Take 5,000 Units by mouth daily.   Xigduo XR 07-998 MG Tb24 Generic drug: Dapagliflozin-metFORMIN HCl ER Take 1 tablet by mouth 2 (two) times daily. Take 1 tablet by mouth twice daily.       Allergies:  Allergies  Allergen Reactions  . Penicillins     Causes yeast infections Did it involve swelling of the face/tongue/throat, SOB, or low BP? No Did it involve sudden or severe rash/hives, skin peeling, or any reaction on the inside of your mouth or nose? No Did you need to seek medical attention at a hospital or doctor's office? No When did it last happen?2018 If all above answers are "NO", may proceed with cephalosporin use.   Marland Kitchen Amoxicillin Other (See Comments)    Pt states it causes her a yeast infection  Causes yeast infections Did it involve swelling of the face/tongue/throat, SOB, or low BP? No Did it involve sudden or severe rash/hives, skin peeling, or any reaction on the inside of your mouth or nose? No Did you need to seek medical attention at a hospital or doctor's office? No When did it last happen?2018 If all above answers are "NO", may proceed with cephalosporin use.  . Erythromycin     Passed out while taking    Past Medical History:   Diagnosis Date  . Arthritis   . Breast abscess 2000   right breast  . Carpal tunnel syndrome   . Complication of anesthesia   . Degenerative disc disease   . Diabetes mellitus type II   . Eczema of hand   . Hx of colonic polyps 12/02/2014   2004 - 2 diminutive polyps   . Hyperlipidemia   . Hypertension   . Metatarsal deformity   . Ovarian cyst 07/2017   left   . PONV (postoperative nausea and vomiting)   . Shingles 2000  . Tubular adenoma of colon     Past Surgical History:  Procedure Laterality Date  . ABDOMINAL HYSTERECTOMY  1981   menorrhagia  . ABSCESS DRAINAGE Right    breast  . APPENDECTOMY  1966   done with CSxn. Prophylactic  . BREAST BIOPSY Left   . carpel tunnel Right   . CESAREAN SECTION     x2  . COLONOSCOPY    . LUMBAR LAMINECTOMY     [id number]. 2011 L4,L5,S1  . Right rotator cuff repair 09/2015    . ROBOTIC ASSISTED SALPINGO OOPHERECTOMY Left 04/16/2018   Procedure: XI ROBOTIC ASSISTED  LEFT SALPINGO OOPHORECTOMY;  Surgeon: [name], MD;  Location: [facility];  Service: Gynecology;  Laterality: Left;  . TUBAL LIGATION      Family History  Problem Relation Age of Onset  . Hypertension Father   . Diabetes Mother   . Stroke Mother   . Hypertension Mother   . High Cholesterol Mother   . Heart disease Mother   . Diabetes Brother   . Breast cancer Daughter 2  unsure if did genetic testing  . Breast cancer Maternal Aunt   . Breast cancer Sister 67  . Colon polyps Brother     Social History:  reports that she has never smoked. She has never used smokeless tobacco. She reports that she does not drink alcohol and does not use drugs.   Review of Systems   Lipid history: Has had high triglycerides and LDL, LDL below 100 on atorvastatin 40 mg  Hypertriglyceridemia treated with Lovaza, triglycerides are last back to normal   Lab Results  Component Value Date   CHOL 120 06/27/2019   CHOL 137 08/19/2018   CHOL 123 04/03/2018   Lab  Results  Component Value Date   HDL 43.90 06/27/2019   HDL 42.80 08/19/2018   HDL 43.40 04/03/2018   Lab Results  Component Value Date   LDLCALC 48 06/27/2019   LDLCALC 47 04/03/2018   LDLCALC 72 11/23/2016   Lab Results  Component Value Date   TRIG 144.0 06/27/2019   TRIG 222.0 (H) 08/19/2018   TRIG 164.0 (H) 04/03/2018   Lab Results  Component Value Date   CHOLHDL 3 06/27/2019   CHOLHDL 3 08/19/2018   CHOLHDL 3 04/03/2018   Lab Results  Component Value Date   LDLDIRECT 69.0 08/19/2018   LDLDIRECT 80.0 02/06/2018   LDLDIRECT 91.0 08/22/2016            Hypertension: Well controlled with 5 mg Lisinopril and 25 mg HCTZ Also on Farxiga  BP Readings from Last 3 Encounters:  10/09/19 (!) 132/70  07/03/19 134/70  04/07/19 132/68   Lab Results  Component Value Date   K 3.9 10/07/2019    Most recent eye exam was 10/20  She has longstanding neuropathy and 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 on the left side, taking gabapentin 300 mg  Also asking about pain in her thigh areas   LABS:  Lab on 10/07/2019  Component Date Value Ref Range Status  . Sodium 10/07/2019 137  135 - 145 mEq/L Final  . Potassium 10/07/2019 3.9  3.5 - 5.1 mEq/L Final  . Chloride 10/07/2019 100  96 - 112 mEq/L Final  . CO2 10/07/2019 29  19 - 32 mEq/L Final  . Glucose, Bld 10/07/2019 116* 70 - 99 mg/dL Final  . BUN 10/07/2019 16  6 - 23 mg/dL Final  . Creatinine, Ser 10/07/2019 0.58  0.40 - 1.20 mg/dL Final  . GFR 10/07/2019 101.57  >60.00 mL/min Final  . Calcium 10/07/2019 10.1  8.4 - 10.5 mg/dL Final  . Hgb A1c MFr Bld 10/07/2019 6.6* 4.6 - 6.5 % Final   Glycemic Control Guidelines for People with Diabetes:Non Diabetic:  <6%Goal of Therapy: <7%Additional Action Suggested:  >8%     Physical Examination:  BP (!) 132/70 (BP Location: Left Arm, Patient Position: Sitting, Cuff Size: Large)   Pulse 72   Ht 5\' 8"  (1.727 m)   Wt 172 lb (78 kg)   SpO2 95%   BMI 26.15 kg/m   Diabetic Foot Exam -  Simple   Simple Foot Form Diabetic Foot exam was performed with the following findings: Yes   Visual Inspection No deformities, no ulcerations, no other skin breakdown bilaterally: Yes Sensation Testing See comments: Yes Pulse Check Posterior Tibialis and Dorsalis pulse intact bilaterally: Yes Comments Absent on left toes      ASSESSMENT:  Diabetes type 2, non-insulin-dependent  See history of present illness for detailed discussion of current diabetes management, blood sugar patterns and problems identified  Her A1c is back to 6.6  She is on a 4 drug regimen including Trulicity, Xigduo and 2 mg  Amaryl  Overall blood sugar is controlled with only some high readings after breakfast when she has no carbohydrates However can check blood sugars after lunch and dinner more often especially after dinner Doing well especially with 3 mg Trulicity   HYPERTENSION: Blood pressure is consistently controlled  No change in renal function with continuing both HCTZ and Farxiga  Neuropathy: Encouraged her to check her feet on the bottom daily   PLAN:   No change in medication regimen She can continue to take Xigduo in split doses but only with main meals She should be able to continue Trulicity as she is still getting this on her patient assistance program Discussed blood sugar targets before and after meals especially after dinner To try and have less carbohydrate and more protein at breakfast and given her examples  She will discuss other issues including leg pains with PCP  Patient Instructions  Check blood sugars on waking up 3 days a week  Also check blood sugars about 2 hours after meals and do this after different meals by rotation  Recommended blood sugar levels on waking up are 90-130 and about 2 hours after meal is 130-160  Please bring your blood sugar monitor to each visit, thank you          Elayne Snare 10/09/2019, 9:16 PM   Note: This office note was  prepared with Dragon voice recognition system technology. Any transcriptional errors that result from this process are unintentional.

## 2019-10-09 NOTE — Patient Instructions (Signed)
Check blood sugars on waking up 3 days a week  Also check blood sugars about 2 hours after meals and do this after different meals by rotation  Recommended blood sugar levels on waking up are 90-130 and about 2 hours after meal is 130-160  Please bring your blood sugar monitor to each visit, thank you   

## 2019-10-27 ENCOUNTER — Other Ambulatory Visit: Payer: Self-pay

## 2019-10-27 ENCOUNTER — Ambulatory Visit (INDEPENDENT_AMBULATORY_CARE_PROVIDER_SITE_OTHER): Payer: Medicare Other | Admitting: Podiatry

## 2019-10-27 DIAGNOSIS — L600 Ingrowing nail: Secondary | ICD-10-CM

## 2019-10-27 MED ORDER — DOXYCYCLINE HYCLATE 100 MG PO TABS: 100.0000 mg | ORAL_TABLET | Freq: Two times a day (BID) | ORAL | 0 refills | Status: DC

## 2019-10-27 MED ORDER — GENTAMICIN SULFATE 0.1 % EX CREA: 1.0000 "application " | TOPICAL_CREAM | Freq: Two times a day (BID) | CUTANEOUS | 1 refills | Status: DC

## 2019-10-27 NOTE — Progress Notes (Signed)
   Subjective: Patient presents today as a new patient for evaluation of pain to the medial aspect of the left great toe.  Patient states that she does have history of partial permanent nail avulsion when she was 74 years old to the same area.  She gets consistent regrowth of the nail to the area.. Patient is concerned for possible ingrown nail. Patient presents today for further treatment and evaluation.  Past Medical History:  Diagnosis Date  . Arthritis   . Breast abscess 2000   right breast  . Carpal tunnel syndrome   . Complication of anesthesia   . Degenerative disc disease   . Diabetes mellitus type II   . Eczema of hand   . Hx of colonic polyps 12/02/2014   2004 - 2 diminutive polyps   . Hyperlipidemia   . Hypertension   . Metatarsal deformity   . Ovarian cyst 07/2017   left   . PONV (postoperative nausea and vomiting)   . Shingles 2000  . Tubular adenoma of colon     Objective:  General: Well developed, nourished, in no acute distress, alert and oriented x3   Dermatology: Skin is warm, dry and supple bilateral.  Medial border left hallux appears to be erythematous with evidence of an ingrowing nail spicule. Pain on palpation noted to the border of the nail fold. The remaining nails appear unremarkable at this time. There are no open sores, lesions.  Vascular: Dorsalis Pedis artery and Posterior Tibial artery pedal pulses palpable. No lower extremity edema noted.   Neruologic: Grossly intact via light touch bilateral.  Musculoskeletal: Muscular strength within normal limits in all groups bilateral. Normal range of motion noted to all pedal and ankle joints.   Assesement: #1 Paronychia with ingrowing nail medial border left hallux #2 Pain in toe #3 Incurvated nail  Plan of Care:  1. Patient evaluated.  2. Discussed treatment alternatives and plan of care. Explained nail avulsion procedure and post procedure course to patient. 3. Patient opted for permanent partial  nail avulsion of the medial border left hallux.  4. Prior to procedure, local anesthesia infiltration utilized using 3 ml of a 50:50 mixture of 2% plain lidocaine and 0.5% plain marcaine in a normal hallux block fashion and a betadine prep performed.  5. Partial permanent nail avulsion with chemical matrixectomy performed using [id number] applications of phenol followed by alcohol flush.  6. Light dressing applied. 7.  Prescription for doxycycline 100 mg 2 times daily #20  8.  Prescription for gentamicin cream apply 2 times daily  9.  Return to clinic 2 weeks.  Edrick Kins, DPM [facility]  Dr. Edrick Kins, [address]                                        [place], [place] [address]                Office [phone]  Fax [phone]

## 2019-10-27 NOTE — Patient Instructions (Signed)

## 2019-11-10 ENCOUNTER — Other Ambulatory Visit: Payer: Self-pay

## 2019-11-10 ENCOUNTER — Ambulatory Visit (INDEPENDENT_AMBULATORY_CARE_PROVIDER_SITE_OTHER): Payer: Medicare Other | Admitting: Podiatry

## 2019-11-10 DIAGNOSIS — Z9889 Other specified postprocedural states: Secondary | ICD-10-CM | POA: Diagnosis not present

## 2019-11-10 DIAGNOSIS — L600 Ingrowing nail: Secondary | ICD-10-CM | POA: Diagnosis not present

## 2019-11-10 NOTE — Progress Notes (Signed)
  Subjective:  Patient ID: Sandra Ray, female    DOB: [birth year],  MRN: [id number]  Chief Complaint  Patient presents with  . Ingrown Toenail    nail check left    74 y.o. female presents with the above complaint. History confirmed with patient.  Here to check on her left hallux nail avulsion.  Objective:  Physical Exam: warm, good capillary refill, no trophic changes or ulcerative lesions, normal DP and PT pulses and normal sensory exam. Left Foot: Left hallux nail side healing well with mild erythema around the matricectomy site, no tenderness to palpation, no signs of infection.   Assessment:   1. Ingrown toenail of left foot   2. Post-operative state      Plan:  Patient was evaluated and treated and all questions answered.  -Nail is healing well.  She can begin to wean soaking and antibiotic ointment application.  Advised to make appointment for 3 weeks to see Dr. Amalia Hailey in the event that it is still rather uncomfortable.  She may cancel the appointment if she has no issues.   Return in about 3 weeks (around 12/01/2019) for with Dr Amalia Hailey.

## 2019-11-26 ENCOUNTER — Ambulatory Visit: Payer: Self-pay | Admitting: Nurse Practitioner

## 2019-11-27 ENCOUNTER — Other Ambulatory Visit: Payer: Self-pay

## 2019-11-28 ENCOUNTER — Ambulatory Visit (INDEPENDENT_AMBULATORY_CARE_PROVIDER_SITE_OTHER): Payer: Medicare Other | Admitting: Nurse Practitioner

## 2019-11-28 ENCOUNTER — Encounter: Payer: Self-pay | Admitting: Nurse Practitioner

## 2019-11-28 VITALS — BP 130/82 | HR 78 | Temp 97.6°F | Ht 68.5 in | Wt 167.7 lb

## 2019-11-28 DIAGNOSIS — E1151 Type 2 diabetes mellitus with diabetic peripheral angiopathy without gangrene: Secondary | ICD-10-CM

## 2019-11-28 DIAGNOSIS — I10 Essential (primary) hypertension: Secondary | ICD-10-CM | POA: Diagnosis not present

## 2019-11-28 DIAGNOSIS — E1140 Type 2 diabetes mellitus with diabetic neuropathy, unspecified: Secondary | ICD-10-CM

## 2019-11-28 DIAGNOSIS — E785 Hyperlipidemia, unspecified: Secondary | ICD-10-CM

## 2019-11-28 MED ORDER — ATORVASTATIN CALCIUM 40 MG PO TABS: 40.0000 mg | ORAL_TABLET | Freq: Every day | ORAL | 3 refills | Status: DC

## 2019-11-28 MED ORDER — GABAPENTIN 300 MG PO CAPS: 300.0000 mg | ORAL_CAPSULE | Freq: Three times a day (TID) | ORAL | 3 refills | Status: DC

## 2019-11-28 NOTE — Assessment & Plan Note (Signed)
LDL at [facility] with atovastatin Lipid Panel     Component Value Date/Time   CHOL 120 06/27/2019 0821   TRIG 144.0 06/27/2019 0821   HDL 43.90 06/27/2019 0821   CHOLHDL 3 06/27/2019 0821   VLDL 28.8 06/27/2019 0821   LDLCALC 48 06/27/2019 0821   LDLDIRECT 69.0 08/19/2018 0904   Repeat lipid panel and CMP ordered by endocrinology. Maintain current medication

## 2019-11-28 NOTE — Assessment & Plan Note (Signed)
Stable to use of gabapentin TID. Foot exam completed by Dr. Amalia Hailey (podiatry)

## 2019-11-28 NOTE — Patient Instructions (Signed)
Maintain lab appt with Dr. Ronnie Derby office  Will obtain eye exam report from [facility] eye care.

## 2019-11-28 NOTE — Progress Notes (Signed)
Subjective:  Patient ID: Sandra Ray, female    DOB: April 18, 1945  Age: 74 y.o. MRN: [id number]  CC: Follow-up (Pt states she needs refill on her atorvastatin and gabapentin. Pt already has flu vaccine and it has been documented in chart. )  HPI  Essential hypertension BP at goal with HCTZ and lisinopril BP Readings from Last 3 Encounters:  11/28/19 130/82  10/09/19 (!) 132/70  07/03/19 134/70   Repeat CMP ordered by endocrinology Maintain current medications  DM neuropathy, type II diabetes mellitus Stable to use of gabapentin TID. Foot exam completed by Dr. Amalia Hailey (podiatry)  Hyperlipidemia LDL goal <70 LDL at [facility] with atovastatin Lipid Panel     Component Value Date/Time   CHOL 120 06/27/2019 0821   TRIG 144.0 06/27/2019 0821   HDL 43.90 06/27/2019 0821   CHOLHDL 3 06/27/2019 0821   VLDL 28.8 06/27/2019 0821   LDLCALC 48 06/27/2019 0821   LDLDIRECT 69.0 08/19/2018 0904   Repeat lipid panel and CMP ordered by endocrinology. Maintain current medication  Wt Readings from Last 3 Encounters:  11/28/19 167 lb 11.2 oz (76.1 kg)  10/09/19 172 lb (78 kg)  07/03/19 176 lb 6.4 oz (80 kg)   Reviewed past Medical, Social and Family history today.  Outpatient Medications Prior to Visit  Medication Sig Dispense Refill  . acetaminophen (TYLENOL) 650 MG CR tablet Take 650 mg by mouth every 8 (eight) hours as needed for pain.    Marland Kitchen albuterol (VENTOLIN HFA) 108 (90 BASE) MCG/ACT inhaler Inhale 2 puffs into the lungs every 6 (six) hours as needed for wheezing or shortness of breath.     . Cholecalciferol (VITAMIN D-3) 5000 UNITS TABS Take 5,000 Units by mouth daily.     . Coenzyme Q10 400 MG CAPS Take 400 mg by mouth daily.     . cyanocobalamin 500 MCG tablet Take 500 mcg by mouth daily.    . Dapagliflozin-metFORMIN HCl ER (XIGDUO XR) 07-998 MG TB24 Take 1 tablet by mouth 2 (two) times daily. Take 1 tablet by mouth twice daily. 180 tablet 4  . doxycycline (VIBRA-TABS) 100 MG tablet  Take 1 tablet (100 mg total) by mouth 2 (two) times daily. 20 tablet 0  . Dulaglutide (TRULICITY) 3 VF/6.4PP SOPN Inject 3 mg into the skin once a week. Inject 3mg  under the skin once weekly. 12 pen 2  . fluticasone (FLONASE) 50 MCG/ACT nasal spray Place 2 sprays into both nostrils daily. (Patient taking differently: Place 2 sprays into both nostrils daily as needed for allergies. ) 16 g 0  . Garlic 2951 MG CAPS Take 1,000 mg by mouth daily.     Marland Kitchen gentamicin cream (GARAMYCIN) 0.1 % Apply 1 application topically 2 (two) times daily. 15 g 1  . glimepiride (AMARYL) 2 MG tablet TAKE 1 TABLET BY MOUTH  DAILY BEFORE BREAKFAST 90 tablet 0  . glucose blood (ONETOUCH VERIO) test strip Use Onetouch verio test strips as instructed to check blood sugar twice daily. 200 each 12  . hydrochlorothiazide (HYDRODIURIL) 25 MG tablet Take 1 tablet (25 mg total) by mouth daily. Need office visit for additional refills 90 tablet 1  . Lancets (ONETOUCH ULTRASOFT) lancets Use as instructed once day.  E11.40 100 each 5  . lisinopril (ZESTRIL) 5 MG tablet TAKE 1 TABLET BY MOUTH  DAILY 90 tablet 3  . omega-3 acid ethyl esters (LOVAZA) 1 g capsule Take 1 capsule (1 g total) by mouth 2 (two) times daily. 60 capsule 5  .  ondansetron (ZOFRAN) 4 MG tablet Take 1 tablet (4 mg total) by mouth as needed for nausea or vomiting. 50 tablet 0  . potassium chloride SA (KLOR-CON) 20 MEQ tablet Take 1 tablet (20 mEq total) by mouth daily. Need office visit for additional refills 90 tablet 1  . traMADol (ULTRAM) 50 MG tablet Take 1 tablet (50 mg total) by mouth every 6 (six) hours as needed. 10 tablet 0  . atorvastatin (LIPITOR) 40 MG tablet Take 1 tablet (40 mg total) by mouth daily at 6 PM. Need office visit for additional refills 90 tablet 1  . gabapentin (NEURONTIN) 300 MG capsule TAKE 1 CAPSULE BY MOUTH 3  TIMES DAILY 270 capsule 1  . oxybutynin (DITROPAN-XL) 5 MG 24 hr tablet Take 1 tablet (5 mg total) by mouth at bedtime. (Patient not  taking: Reported on 11/28/2019) 30 tablet 5   No facility-administered medications prior to visit.    ROS See HPI  Objective:  BP 130/82 (BP Location: Left Arm, Patient Position: Sitting, Cuff Size: Normal)   Pulse 78   Temp 97.6 F (36.4 C) (Temporal)   Ht 5' 8.5" (1.74 m)   Wt 167 lb 11.2 oz (76.1 kg)   SpO2 97%   BMI 25.13 kg/m   Physical Exam Vitals reviewed.  Cardiovascular:     Rate and Rhythm: Normal rate and regular rhythm.     Pulses: Normal pulses.     Heart sounds: Normal heart sounds.  Pulmonary:     Effort: Pulmonary effort is normal.     Breath sounds: Normal breath sounds.  Musculoskeletal:     Right lower leg: No edema.     Left lower leg: No edema.  Neurological:     Mental Status: She is alert and oriented to person, place, and time.  Psychiatric:        Mood and Affect: Mood normal.        Behavior: Behavior normal.     Assessment & Plan:  This visit occurred during the SARS-CoV-2 public health emergency.  Safety protocols were in place, including screening questions prior to the visit, additional usage of staff PPE, and extensive cleaning of exam room while observing appropriate contact time as indicated for disinfecting solutions.   [place] was seen today for follow-up.  Diagnoses and all orders for this visit:  Essential hypertension -     TSH; Future  Type II diabetes mellitus with peripheral artery disease (HCC) -     gabapentin (NEURONTIN) 300 MG capsule; Take 1 capsule (300 mg total) by mouth 3 (three) times daily.  Hyperlipidemia LDL goal <70 -     atorvastatin (LIPITOR) 40 MG tablet; Take 1 tablet (40 mg total) by mouth daily at 6 PM. -     TSH; Future  Type 2 diabetes mellitus with diabetic neuropathy, without long-term current use of insulin ([place])    Problem List Items Addressed This Visit      Cardiovascular and Mediastinum   Essential hypertension - Primary    BP at goal with HCTZ and lisinopril BP Readings from Last 3  Encounters:  11/28/19 130/82  10/09/19 (!) 132/70  07/03/19 134/70   Repeat CMP ordered by endocrinology Maintain current medications      Relevant Medications   atorvastatin (LIPITOR) 40 MG tablet   Other Relevant Orders   TSH     Endocrine   DM neuropathy, type II diabetes mellitus ([place])    Stable to use of gabapentin TID. Foot exam completed by  Dr. Amalia Hailey (podiatry)      Relevant Medications   atorvastatin (LIPITOR) 40 MG tablet     Other   Hyperlipidemia LDL goal <70    LDL at [facility] with atovastatin Lipid Panel     Component Value Date/Time   CHOL 120 06/27/2019 0821   TRIG 144.0 06/27/2019 0821   HDL 43.90 06/27/2019 0821   CHOLHDL 3 06/27/2019 0821   VLDL 28.8 06/27/2019 0821   LDLCALC 48 06/27/2019 0821   LDLDIRECT 69.0 08/19/2018 0904   Repeat lipid panel and CMP ordered by endocrinology. Maintain current medication      Relevant Medications   atorvastatin (LIPITOR) 40 MG tablet   Other Relevant Orders   TSH    Other Visit Diagnoses    Type II diabetes mellitus with peripheral artery disease (HCC)       Relevant Medications   gabapentin (NEURONTIN) 300 MG capsule   atorvastatin (LIPITOR) 40 MG tablet      Follow-up: Return in about 6 months (around 05/27/2020) for HTN and , hyperlipidemia.  Wilfred Lacy, NP

## 2019-11-28 NOTE — Assessment & Plan Note (Signed)
BP at goal with HCTZ and lisinopril BP Readings from Last 3 Encounters:  11/28/19 130/82  10/09/19 (!) 132/70  07/03/19 134/70   Repeat CMP ordered by endocrinology Maintain current medications

## 2019-12-03 ENCOUNTER — Ambulatory Visit: Payer: Medicare Other | Admitting: Podiatry

## 2019-12-16 ENCOUNTER — Other Ambulatory Visit: Payer: Self-pay | Admitting: Endocrinology

## 2020-01-06 ENCOUNTER — Ambulatory Visit: Payer: Medicare Other

## 2020-01-08 ENCOUNTER — Other Ambulatory Visit: Payer: Self-pay | Admitting: Endocrinology

## 2020-01-14 DIAGNOSIS — E119 Type 2 diabetes mellitus without complications: Secondary | ICD-10-CM | POA: Diagnosis not present

## 2020-01-14 LAB — HM DIABETES EYE EXAM

## 2020-01-23 ENCOUNTER — Other Ambulatory Visit: Payer: Self-pay | Admitting: Nurse Practitioner

## 2020-01-23 DIAGNOSIS — E876 Hypokalemia: Secondary | ICD-10-CM

## 2020-01-23 DIAGNOSIS — I10 Essential (primary) hypertension: Secondary | ICD-10-CM

## 2020-01-26 ENCOUNTER — Encounter: Payer: Self-pay | Admitting: *Deleted

## 2020-01-26 NOTE — Telephone Encounter (Signed)
Last OV 11/28/19 Last fill for both medications 09/05/19  #90/1

## 2020-01-28 ENCOUNTER — Other Ambulatory Visit: Payer: Self-pay | Admitting: *Deleted

## 2020-01-28 MED ORDER — XIGDUO XR 5-1000 MG PO TB24: 1.0000 | ORAL_TABLET | Freq: Two times a day (BID) | ORAL | 4 refills | Status: DC

## 2020-02-02 ENCOUNTER — Other Ambulatory Visit (INDEPENDENT_AMBULATORY_CARE_PROVIDER_SITE_OTHER): Payer: Medicare Other

## 2020-02-02 ENCOUNTER — Other Ambulatory Visit: Payer: Self-pay

## 2020-02-02 DIAGNOSIS — E785 Hyperlipidemia, unspecified: Secondary | ICD-10-CM

## 2020-02-02 DIAGNOSIS — I10 Essential (primary) hypertension: Secondary | ICD-10-CM | POA: Diagnosis not present

## 2020-02-02 DIAGNOSIS — E782 Mixed hyperlipidemia: Secondary | ICD-10-CM | POA: Diagnosis not present

## 2020-02-02 DIAGNOSIS — E1165 Type 2 diabetes mellitus with hyperglycemia: Secondary | ICD-10-CM | POA: Diagnosis not present

## 2020-02-02 LAB — LIPID PANEL
Cholesterol: 117 mg/dL (ref 0–200)
HDL: 48 mg/dL (ref >39.00)
LDL Cholesterol: 45 mg/dL (ref 0–99)
NonHDL: 68.53
Total CHOL/HDL Ratio: 2
Triglycerides: 120 mg/dL (ref 0.0–149.0)
VLDL: 24 mg/dL (ref 0.0–40.0)

## 2020-02-02 LAB — COMPREHENSIVE METABOLIC PANEL
ALT: 31 U/L (ref 0–35)
AST: 27 U/L (ref 0–37)
Albumin: 4.2 g/dL (ref 3.5–5.2)
Alkaline Phosphatase: 44 U/L (ref 39–117)
BUN: 14 mg/dL (ref 6–23)
CO2: 31 mEq/L (ref 19–32)
Calcium: 9.5 mg/dL (ref 8.4–10.5)
Chloride: 98 mEq/L (ref 96–112)
Creatinine, Ser: 0.56 mg/dL (ref 0.40–1.20)
GFR: 89.84 mL/min (ref >60.00)
Glucose, Bld: 99 mg/dL (ref 70–99)
Potassium: 4 mEq/L (ref 3.5–5.1)
Sodium: 138 mEq/L (ref 135–145)
Total Bilirubin: 0.5 mg/dL (ref 0.2–1.2)
Total Protein: 6.5 g/dL (ref 6.0–8.3)

## 2020-02-02 LAB — TSH: TSH: 1.69 u[IU]/mL (ref 0.35–4.50)

## 2020-02-02 LAB — HEMOGLOBIN A1C: Hgb A1c MFr Bld: 6.4 % (ref 4.6–6.5)

## 2020-02-12 ENCOUNTER — Other Ambulatory Visit: Payer: Self-pay

## 2020-02-12 ENCOUNTER — Encounter: Payer: Self-pay | Admitting: Endocrinology

## 2020-02-12 ENCOUNTER — Ambulatory Visit: Payer: Medicare Other | Admitting: Endocrinology

## 2020-02-12 VITALS — BP 146/74 | HR 84 | Ht 69.0 in | Wt 167.8 lb

## 2020-02-12 DIAGNOSIS — E1165 Type 2 diabetes mellitus with hyperglycemia: Secondary | ICD-10-CM | POA: Diagnosis not present

## 2020-02-12 DIAGNOSIS — E782 Mixed hyperlipidemia: Secondary | ICD-10-CM

## 2020-02-12 DIAGNOSIS — I10 Essential (primary) hypertension: Secondary | ICD-10-CM | POA: Diagnosis not present

## 2020-02-12 DIAGNOSIS — E1142 Type 2 diabetes mellitus with diabetic polyneuropathy: Secondary | ICD-10-CM | POA: Diagnosis not present

## 2020-02-12 MED ORDER — ONDANSETRON HCL 4 MG PO TABS: 4.0000 mg | ORAL_TABLET | ORAL | 1 refills | Status: DC | PRN

## 2020-02-12 NOTE — Patient Instructions (Addendum)
Glimeperide 1/2 in am   Check blood sugars on waking up 2-3 days a week  Also check blood sugars about 2 hours after meals and do this after different meals by rotation  Recommended blood sugar levels on waking up are 90-130 and about 2 hours after meal is 130-160  Please bring your blood sugar monitor to each visit, thank you

## 2020-02-12 NOTE — Progress Notes (Signed)
Patient ID: Sandra Ray, female   DOB: 18-Jul-1945, 74 y.o.   MRN: [id number]            Reason for Appointment: Follow-up    History of Present Illness:          Date of diagnosis of type 2 diabetes mellitus: 1996       Background history:   She had a high blood sugar on lab work which diagnosed her diabetes and she was most likely treated with metformin for a few years She has also taking glipizide for several years She was tried on Actos which was started because of significant weight gain. Over the last few years she has been taking either  Januvia or Tradjenta with her metformin in combination with fair control Her A1c has been below 8% only twice in the last 2-3 years  Recent history:    Non-insulin hypoglycemic drugs: Amaryl 2 mg in the morning, Trulicity  3 mg weekly,  Xigduo 07/998, 1 tablet twice daily  Current management, blood sugar patterns and problems identified:  Her A1c has improved further at 6.4 compared to 6.6    She continues to have good diabetes control with continuing the higher dose of 3 mg Trulicity  Also trying to take Xigduo twice a day even though at times it will cause some nausea; also has occasional diarrhea  However she does not think the side effects are frequent enough to reduce the dose  She has been able to generally watch her diet better  Her weight is about the same recently but down 5 pounds since July  She does not walk much because of hip pain  Insulin doses were not changed on the last visit  Blood sugars are minimally higher at lunchtime, does try to have some protein with breakfast  No hypoglycemic symptoms and lowest blood sugar is 78        Side effects from medications have been: Weight gain from Actos, nausea from high dose metformin   Glucose monitoring:  done <1 times a day         Glucometer: One Touch Verio     Blood Glucose readings and averages by time of day from monitor download   PRE-MEAL Fasting Lunch  Dinner Bedtime Overall  Glucose range:  100-124  155, 166  78, 157  78-166  Mean/median:  111    121   POST-MEAL PC Breakfast PC Lunch PC Dinner  Glucose range:   89-126  132, 148  Mean/median:      Previous readings:  PRE-MEAL Fasting Lunch Dinner Bedtime Overall  Glucose range:  87-124  168  101    median:      115   POST-MEAL PC Breakfast PC Lunch PC Dinner  Glucose range:  133-192   157, 136  Mean/median:        Self-care: The diet that the patient has been following is: tries to limit fried food.     Typical meal intake: Breakfast is cereal, egg, almond milk,  lunch [place] burger and salad, dinner is soup and crackers and vegetables.  Snacks are usually small apple                Dietician visit, most recent: 07/13/16               Exercise:  Minimal  Weight history: Previous range 200-254  Wt Readings from Last 3 Encounters:  02/12/20 167 lb 12.8 oz (76.1 kg)  11/28/19 167  lb 11.2 oz (76.1 kg)  10/09/19 172 lb (78 kg)    Glycemic control:   Lab Results  Component Value Date   HGBA1C 6.4 02/02/2020   HGBA1C 6.6 (H) 10/07/2019   HGBA1C 7.1 (H) 06/27/2019   Lab Results  Component Value Date   MICROALBUR 1.6 06/27/2019   LDLCALC 45 02/02/2020   CREATININE 0.56 02/02/2020   Lab Results  Component Value Date   MICRALBCREAT 1.7 06/27/2019    Lab Results  Component Value Date   FRUCTOSAMINE 237 03/28/2019   FRUCTOSAMINE 263 05/14/2018   FRUCTOSAMINE 328 (H) 07/07/2016      Allergies as of 02/12/2020      Reactions   Penicillins    Causes yeast infections Did it involve swelling of the face/tongue/throat, SOB, or low BP? No Did it involve sudden or severe rash/hives, skin peeling, or any reaction on the inside of your mouth or nose? No Did you need to seek medical attention at a hospital or doctor's office? No When did it last happen?2018 If all above answers are "NO", may proceed with cephalosporin use.   Amoxicillin Other (See Comments)   Pt  states it causes her a yeast infection  Causes yeast infections Did it involve swelling of the face/tongue/throat, SOB, or low BP? No Did it involve sudden or severe rash/hives, skin peeling, or any reaction on the inside of your mouth or nose? No Did you need to seek medical attention at a hospital or doctor's office? No When did it last happen?2018 If all above answers are "NO", may proceed with cephalosporin use.   Erythromycin    Passed out while taking      Medication List       Accurate as of February 12, 2020  1:46 PM. If you have any questions, ask your nurse or doctor.        acetaminophen 650 MG CR tablet Commonly known as: TYLENOL Take 650 mg by mouth every 8 (eight) hours as needed for pain.   atorvastatin 40 MG tablet Commonly known as: LIPITOR Take 1 tablet (40 mg total) by mouth daily at 6 PM.   Coenzyme Q10 400 MG Caps Take 400 mg by mouth daily.   doxycycline 100 MG tablet Commonly known as: VIBRA-TABS Take 1 tablet (100 mg total) by mouth 2 (two) times daily.   fluticasone 50 MCG/ACT nasal spray Commonly known as: FLONASE Place 2 sprays into both nostrils daily. What changed:   when to take this  reasons to take this   gabapentin 300 MG capsule Commonly known as: NEURONTIN Take 1 capsule (300 mg total) by mouth 3 (three) times daily.   Garlic 1610 MG Caps Take 1,000 mg by mouth daily.   gentamicin cream 0.1 % Commonly known as: GARAMYCIN Apply 1 application topically 2 (two) times daily.   glimepiride 2 MG tablet Commonly known as: AMARYL TAKE 1 TABLET BY MOUTH  DAILY BEFORE BREAKFAST   hydrochlorothiazide 25 MG tablet Commonly known as: HYDRODIURIL TAKE 1 TABLET BY MOUTH  DAILY   lisinopril 5 MG tablet Commonly known as: ZESTRIL TAKE 1 TABLET BY MOUTH  DAILY   omega-3 acid ethyl esters 1 g capsule Commonly known as: LOVAZA Take 1 capsule (1 g total) by mouth 2 (two) times daily.   ondansetron 4 MG tablet Commonly known as:  Zofran Take 1 tablet (4 mg total) by mouth as needed for nausea or vomiting.   onetouch ultrasoft lancets Use as instructed once day.  E11.40  OneTouch Verio test strip Generic drug: glucose blood USE AS INSTRUCTED TO CHECK  BLOOD SUGAR TWICE DAILY   oxybutynin 5 MG 24 hr tablet Commonly known as: DITROPAN-XL Take 1 tablet (5 mg total) by mouth at bedtime.   potassium chloride SA 20 MEQ tablet Commonly known as: KLOR-CON TAKE 1 TABLET BY MOUTH  DAILY   traMADol 50 MG tablet Commonly known as: ULTRAM Take 1 tablet (50 mg total) by mouth every 6 (six) hours as needed.   Trulicity 3 XI/3.3AS Sopn Generic drug: Dulaglutide Inject 3 mg into the skin once a week. Inject 3mg  under the skin once weekly.   Ventolin HFA 108 (90 Base) MCG/ACT inhaler Generic drug: albuterol Inhale 2 puffs into the lungs every 6 (six) hours as needed for wheezing or shortness of breath.   vitamin B-12 500 MCG tablet Commonly known as: CYANOCOBALAMIN Take 500 mcg by mouth daily.   Vitamin D-3 125 MCG (5000 UT) Tabs Take 5,000 Units by mouth daily.   Xigduo XR 07-998 MG Tb24 Generic drug: Dapagliflozin-metFORMIN HCl ER Take 1 tablet by mouth 2 (two) times daily. Take 1 tablet by mouth twice daily.       Allergies:  Allergies  Allergen Reactions  . Penicillins     Causes yeast infections Did it involve swelling of the face/tongue/throat, SOB, or low BP? No Did it involve sudden or severe rash/hives, skin peeling, or any reaction on the inside of your mouth or nose? No Did you need to seek medical attention at a hospital or doctor's office? No When did it last happen?2018 If all above answers are "NO", may proceed with cephalosporin use.   Marland Kitchen Amoxicillin Other (See Comments)    Pt states it causes her a yeast infection  Causes yeast infections Did it involve swelling of the face/tongue/throat, SOB, or low BP? No Did it involve sudden or severe rash/hives, skin peeling, or any reaction  on the inside of your mouth or nose? No Did you need to seek medical attention at a hospital or doctor's office? No When did it last happen?2018 If all above answers are "NO", may proceed with cephalosporin use.  . Erythromycin     Passed out while taking    Past Medical History:  Diagnosis Date  . Arthritis   . Breast abscess 2000   right breast  . Carpal tunnel syndrome   . Complication of anesthesia   . Degenerative disc disease   . Diabetes mellitus type II   . Eczema of hand   . Hx of colonic polyps 12/02/2014   2004 - 2 diminutive polyps   . Hyperlipidemia   . Hypertension   . Metatarsal deformity   . Ovarian cyst 07/2017   left   . PONV (postoperative nausea and vomiting)   . Shingles 2000  . Tubular adenoma of colon     Past Surgical History:  Procedure Laterality Date  . ABDOMINAL HYSTERECTOMY  1981   menorrhagia  . ABSCESS DRAINAGE Right    breast  . APPENDECTOMY  1966   done with CSxn. Prophylactic  . BREAST BIOPSY Left   . carpel tunnel Right   . CESAREAN SECTION     x2  . COLONOSCOPY    . LUMBAR LAMINECTOMY     [id number]. 2011 L4,L5,S1  . Right rotator cuff repair 09/2015    . ROBOTIC ASSISTED SALPINGO OOPHERECTOMY Left 04/16/2018   Procedure: XI ROBOTIC ASSISTED  LEFT SALPINGO OOPHORECTOMY;  Surgeon: [name], MD;  Location:  [facility];  Service: Gynecology;  Laterality: Left;  . TUBAL LIGATION      Family History  Problem Relation Age of Onset  . Hypertension Father   . Diabetes Mother   . Stroke Mother   . Hypertension Mother   . High Cholesterol Mother   . Heart disease Mother   . Diabetes Brother   . Breast cancer Daughter 27       unsure if did genetic testing  . Breast cancer Maternal Aunt   . Breast cancer Sister 27  . Colon polyps Brother     Social History:  reports that she has never smoked. She has never used smokeless tobacco. She reports that she does not drink alcohol and does not use drugs.   Review of  Systems   Lipid history: Has had high triglycerides and LDL, LDL below 100 on atorvastatin 40 mg  Hypertriglyceridemia treated with Lovaza, triglycerides are improving along with LDL   Lab Results  Component Value Date   CHOL 117 02/02/2020   CHOL 120 06/27/2019   CHOL 137 08/19/2018   Lab Results  Component Value Date   HDL 48.00 02/02/2020   HDL 43.90 06/27/2019   HDL 42.80 08/19/2018   Lab Results  Component Value Date   LDLCALC 45 02/02/2020   LDLCALC 48 06/27/2019   LDLCALC 47 04/03/2018   Lab Results  Component Value Date   TRIG 120.0 02/02/2020   TRIG 144.0 06/27/2019   TRIG 222.0 (H) 08/19/2018   Lab Results  Component Value Date   CHOLHDL 2 02/02/2020   CHOLHDL 3 06/27/2019   CHOLHDL 3 08/19/2018   Lab Results  Component Value Date   LDLDIRECT 69.0 08/19/2018   LDLDIRECT 80.0 02/06/2018   LDLDIRECT 91.0 08/22/2016            Hypertension: Usually controlled with 5 mg Lisinopril and 25 mg HCTZ Also on Farxiga  However blood pressure is relatively higher today even though it was checked twice, she does not think it is usually higher elsewhere  BP Readings from Last 3 Encounters:  02/12/20 (!) 146/74  11/28/19 130/82  10/09/19 (!) 132/70   Lab Results  Component Value Date   K 4.0 02/02/2020    Most recent eye exam was 11/21  She has longstanding neuropathy and 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 on the left side, taking gabapentin 300 mg    LABS:  No visits with results within 1 Week(s) from this visit.  Latest known visit with results is:  Lab on 02/02/2020  Component Date Value Ref Range Status  . TSH 02/02/2020 1.69  0.35 - 4.50 uIU/mL Final    Physical Examination:  BP (!) 146/74   Pulse 84   Ht 5\' 9"  (1.753 m)   Wt 167 lb 12.8 oz (76.1 kg)   SpO2 97%   BMI 24.78 kg/m     ASSESSMENT:  Diabetes type 2, non-insulin-dependent  See history of present illness for detailed discussion of current diabetes management, blood sugar patterns  and problems identified   Her A1c is excellent at 6.4  She is on a 4 drug regimen including Trulicity 3 mg, Xigduo and 2 mg  Amaryl  She has been able to continue improving her blood sugar control especially with higher doses of Trulicity Also initially able to watch her diet and carbohydrates Weight has gone down since July Recently highest blood sugar is 166 although not checking enough after dinner Blood sugars seem to be slightly lower in the  afternoons   HYPERTENSION: Blood pressure is usually controlled but may be higher from whitecoat syndrome today  LIPIDS: Excellent control and triglycerides are only 120 now.  She is on a relatively high dose of 40 mg Lipitor and reportedly tolerating this well  Neuropathy: She has had some sensory loss   PLAN:   She will reduce her Amaryl to half tablet in the morning and if she still has readings in the 70s in the afternoon can stop it completely Paperwork will be completed for getting her Trulicity from indigent program Again try to cut back on carbohydrates at breakfast time and make sure she has some protein good She will follow up with her PCP soon to recheck her blood pressure and adjust medications if needed     Patient Instructions  Glimeperide 1/2 in am         Elayne Snare 02/12/2020, 1:46 PM   Note: This office note was prepared with Dragon voice recognition system technology. Any transcriptional errors that result from this process are unintentional.

## 2020-03-15 NOTE — Progress Notes (Deleted)
Subjective:   Sandra Ray is a 75 y.o. female who presents for Medicare Annual (Subsequent) preventive examination.  I connected with Sandra Ray today by telephone and verified that I am speaking with the correct person using two identifiers. Location patient: home Location provider: work Persons participating in the virtual visit: patient, Engineer, civil (consulting).    I discussed the limitations, risks, security and privacy concerns of performing an evaluation and management service by telephone and the availability of in person appointments. I also discussed with the patient that there may be a patient responsible charge related to this service. The patient expressed understanding and verbally consented to this telephonic visit.    Interactive audio and video telecommunications were attempted between this provider and patient, however failed, due to patient having technical difficulties OR patient did not have access to video capability.  We continued and completed visit with audio only.  Some vital signs may be absent or patient reported.   Time Spent with patient on telephone encounter: *** minutes   Review of Systems    ***       Objective:    There were no vitals filed for this visit. There is no height or weight on file to calculate BMI.  Advanced Directives 05/12/2018 04/24/2018 04/12/2018 04/04/2018 03/11/2018 03/28/2016 01/19/2015  Does Patient Have a Medical Advance Directive? No No No No No No No  Would patient like information on creating a medical advance directive? - No - Patient declined No - Patient declined No - Patient declined Yes (MAU/Ambulatory/Procedural Areas - Information given) Yes (MAU/Ambulatory/Procedural Areas - Information given) No - patient declined information    Current Medications (verified) Outpatient Encounter Medications as of 03/16/2020  Medication Sig  . acetaminophen (TYLENOL) 650 MG CR tablet Take 650 mg by mouth every 8 (eight) hours as needed for pain.  Marland Kitchen  albuterol (VENTOLIN HFA) 108 (90 BASE) MCG/ACT inhaler Inhale 2 puffs into the lungs every 6 (six) hours as needed for wheezing or shortness of breath.   Marland Kitchen atorvastatin (LIPITOR) 40 MG tablet Take 1 tablet (40 mg total) by mouth daily at 6 PM.  . Cholecalciferol (VITAMIN D-3) 5000 UNITS TABS Take 5,000 Units by mouth daily.  (Patient not taking: Reported on 02/12/2020)  . Coenzyme Q10 400 MG CAPS Take 400 mg by mouth daily.   . cyanocobalamin 500 MCG tablet Take 500 mcg by mouth daily. (Patient not taking: Reported on 02/12/2020)  . Dapagliflozin-metFORMIN HCl ER (XIGDUO XR) 07-998 MG TB24 Take 1 tablet by mouth 2 (two) times daily. Take 1 tablet by mouth twice daily.  Marland Kitchen doxycycline (VIBRA-TABS) 100 MG tablet Take 1 tablet (100 mg total) by mouth 2 (two) times daily.  . Dulaglutide (TRULICITY) 3 MG/0.5ML SOPN Inject 3 mg into the skin once a week. Inject 3mg  under the skin once weekly.  . fluticasone (FLONASE) 50 MCG/ACT nasal spray Place 2 sprays into both nostrils daily. (Patient taking differently: Place 2 sprays into both nostrils daily as needed for allergies. )  . gabapentin (NEURONTIN) 300 MG capsule Take 1 capsule (300 mg total) by mouth 3 (three) times daily.  . Garlic 1000 MG CAPS Take 1,000 mg by mouth daily.   gentamicin cream (GARAMYCIN) 0.1 % Apply 1 application topically 2 (two) times daily.  Marland Kitchen glimepiride (AMARYL) 2 MG tablet TAKE 1 TABLET BY MOUTH  DAILY BEFORE BREAKFAST  . glucose blood (ONETOUCH VERIO) test strip USE AS INSTRUCTED TO CHECK  BLOOD SUGAR TWICE DAILY  . hydrochlorothiazide (HYDRODIURIL) 25  MG tablet TAKE 1 TABLET BY MOUTH  DAILY  . Lancets (ONETOUCH ULTRASOFT) lancets Use as instructed once day.  E11.40  . lisinopril (ZESTRIL) 5 MG tablet TAKE 1 TABLET BY MOUTH  DAILY  . omega-3 acid ethyl esters (LOVAZA) 1 g capsule Take 1 capsule (1 g total) by mouth 2 (two) times daily.  . ondansetron (ZOFRAN) 4 MG tablet Take 1 tablet (4 mg total) by mouth as needed for nausea  or vomiting.  Marland Kitchen oxybutynin (DITROPAN-XL) 5 MG 24 hr tablet Take 1 tablet (5 mg total) by mouth at bedtime. (Patient not taking: Reported on 11/28/2019)  . potassium chloride SA (KLOR-CON) 20 MEQ tablet TAKE 1 TABLET BY MOUTH  DAILY   No facility-administered encounter medications on file as of 03/16/2020.    Allergies (verified) Penicillins, Amoxicillin, and Erythromycin   History: Past Medical History:  Diagnosis Date  . Arthritis   . Breast abscess 2000   right breast  . Carpal tunnel syndrome   . Complication of anesthesia   . Degenerative disc disease   . Diabetes mellitus type II   . Eczema of hand   . Hx of colonic polyps 12/02/2014   2004 - 2 diminutive polyps   . Hyperlipidemia   . Hypertension   . Metatarsal deformity   . Ovarian cyst 07/2017   left   . PONV (postoperative nausea and vomiting)   . Shingles 2000  . Tubular adenoma of colon    Past Surgical History:  Procedure Laterality Date  . ABDOMINAL HYSTERECTOMY  1981   menorrhagia  . ABSCESS DRAINAGE Right    breast  . APPENDECTOMY  1966   done with CSxn. Prophylactic  . BREAST BIOPSY Left   . carpel tunnel Right   . CESAREAN SECTION     x2  . COLONOSCOPY    . LUMBAR LAMINECTOMY     [id number]. 2011 L4,L5,S1  . Right rotator cuff repair 09/2015    . ROBOTIC ASSISTED SALPINGO OOPHERECTOMY Left 04/16/2018   Procedure: XI ROBOTIC ASSISTED  LEFT SALPINGO OOPHORECTOMY;  Surgeon: [name], MD;  Location: [facility];  Service: Gynecology;  Laterality: Left;  . TUBAL LIGATION     Family History  Problem Relation Age of Onset  . Hypertension Father   . Diabetes Mother   . Stroke Mother   . Hypertension Mother   . High Cholesterol Mother   . Heart disease Mother   . Diabetes Brother   . Breast cancer Daughter 52       unsure if did genetic testing  . Breast cancer Maternal Aunt   . Breast cancer Sister 66  . Colon polyps Brother    Social History   Socioeconomic History  . Marital status: Married     Spouse name: Not on file  . Number of children: 2  . Years of education: Not on file  . Highest education level: Not on file  Occupational History  . Not on file  Tobacco Use  . Smoking status: Never Smoker  . Smokeless tobacco: Never Used  Vaping Use  . Vaping Use: Never used  Substance and Sexual Activity  . Alcohol use: No  . Drug use: No  . Sexual activity: Not Currently  Other Topics Concern  . Not on file  Social History Narrative   Married 2 daughters   Not working outside home   no caffeine   12/02/2014   Social Determinants of Health   Financial Resource Strain: Not on file  Food Insecurity: Not on file  Transportation Needs: Not on file  Physical Activity: Not on file  Stress: Not on file  Social Connections: Not on file    Tobacco Counseling Counseling given: Not Answered   Clinical Intake:                 Diabetes:  Is the patient diabetic?  {YES/NO:21197} If diabetic, was a CBG obtained today?  {YES/NO:21197} Did the patient bring in their glucometer from home?  {YES/NO:21197} How often do you monitor your CBG's? ***.   Financial Strains and Diabetes Management:  Are you having any financial strains with the device, your supplies or your medication? {YES/NO:21197}.  Does the patient want to be seen by Chronic Care Management for management of their diabetes?  {YES/NO:21197} Would the patient like to be referred to a Nutritionist or for Diabetic Management?  {YES/NO:21197}  Diabetic Exams:  Diabetic Eye Exam: Completed ***. Overdue for diabetic eye exam. Pt has been advised about the importance in completing this exam. A referral has been placed today. Message sent to referral coordinator for scheduling purposes. Advised pt to expect a call from our office re: appt.  Diabetic Foot Exam: Completed ***. Pt has been advised about the importance in completing this exam. Pt is scheduled for diabetic foot exam on ***.             Activities of Daily Living No flowsheet data found.  Patient Care Team: Nche, Charlene Brooke, NP as PCP - General (Internal Medicine) Sheard, Briscoe Burns, DPM (Inactive) as Consulting Physician (Podiatry) [place] Comment, [place], [place] as Referring Physician (Optometry)  Indicate any recent Medical Services you may have received from other than [facility] providers in the past year (date may be approximate).     Assessment:   This is a routine wellness examination for Oneshia.  Hearing/Vision screen No exam data present  Dietary issues and exercise activities discussed:    Goals    . Weight (lb) < 200 lb (90.7 kg)      Depression Screen PHQ 2/9 Scores 04/03/2018 03/11/2018 08/09/2017 07/13/2016 03/28/2016 11/18/2015 10/22/2014  PHQ - 2 Score 0 0 0 0 0 0 0    Fall Risk Fall Risk  04/03/2018 03/11/2018 08/09/2017 07/13/2016 03/28/2016  Falls in the past year? 0 0 No Yes Yes  Comment - - - - May 2017  Number falls in past yr: - - - 1 1  Injury with Fall? - - - Yes Yes  Comment - - - - Rotator cuff-has since been repaired.  Risk for fall due to : - - - - -  Risk for fall due to: Comment - - - - -  Follow up - - - Falls prevention discussed Education provided;Falls prevention discussed    FALL RISK PREVENTION PERTAINING TO THE HOME:  Any stairs in or around the home? {YES/NO:21197} If so, are there any without handrails? {YES/NO:21197} Home free of loose throw rugs in walkways, pet beds, electrical cords, etc? {YES/NO:21197} Adequate lighting in your home to reduce risk of falls? {YES/NO:21197}  ASSISTIVE DEVICES UTILIZED TO PREVENT FALLS:  Life alert? {YES/NO:21197} Use of a cane, walker or w/c? {YES/NO:21197} Grab bars in the bathroom? {YES/NO:21197} Shower chair or bench in shower? {YES/NO:21197} Elevated toilet seat or a handicapped toilet? {YES/NO:21197}  TIMED UP AND GO:  Was the test performed? {YES/NO:21197}.  Length of time to ambulate 10 feet: *** sec.   {Appearance of  GA:[id number]  Cognitive Function: MMSE - Mini Mental  State Exam 03/28/2016  Orientation to time 5  Orientation to Place 5  Registration 3  Attention/ Calculation 5  Recall 3  Language- name 2 objects 2  Language- repeat 1  Language- follow 3 step command 3  Language- read & follow direction 1  Write a sentence 1  Copy design 1  Total score 30        Immunizations Immunization History  Administered Date(s) Administered  . Influenza Split 12/19/2011  . Influenza Whole 12/17/2008, 12/22/2009  . Influenza, High Dose Seasonal PF 12/25/2014, 11/18/2015, 11/23/2016, 12/14/2017, 10/31/2018  . Influenza,inj,Quad PF,6+ Mos 12/18/2012, 12/23/2013  . Influenza,inj,quad, With Preservative 12/11/2016  . Influenza-Unspecified 11/24/2019  . PFIZER SARS-COV-2 Vaccination 04/18/2019, 05/09/2019, 12/06/2019  . Pneumococcal Conjugate-13 12/23/2013, 08/30/2015  . Pneumococcal Polysaccharide-23 06/19/2011, 01/16/2017  . Td 02/23/2004  . Tdap 01/30/2015  . Zoster 03/28/2010    TDAP status: Up to date  Flu Vaccine status: Up to date  Pneumococcal vaccine status: Up to date  Covid-19 vaccine status: Completed vaccines  Qualifies for Shingles Vaccine? Yes   Zostavax completed Yes   Shingrix Completed?: No.    Education has been provided regarding the importance of this vaccine. Patient has been advised to call insurance company to determine out of pocket expense if they have not yet received this vaccine. Advised may also receive vaccine at local pharmacy or Health Dept. Verbalized acceptance and understanding.  Screening Tests Health Maintenance  Topic Date Due  . HEMOGLOBIN A1C  08/01/2020  . FOOT EXAM  10/08/2020  . OPHTHALMOLOGY EXAM  01/13/2021  . MAMMOGRAM  05/07/2021  . COLONOSCOPY (Pts 45-15yrs Insurance coverage will need to be confirmed)  01/18/2025  . TETANUS/TDAP  01/29/2025  . INFLUENZA VACCINE  Completed  . DEXA SCAN  Completed  . COVID-19 Vaccine  Completed  .  Hepatitis C Screening  Completed  . PNA vac Low Risk Adult  Completed    Health Maintenance  There are no preventive care reminders to display for this patient.  Colorectal cancer screening: Type of screening: Colonoscopy. Completed 01/19/2015. Repeat every 10 years  Mammogram status: Completed Bilateral-05/08/2019. Repeat every year  {Bone Density status:21018021}  Lung Cancer Screening: (Low Dose CT Chest recommended if Age 75-80 years, 30 pack-year currently smoking OR have quit w/in 15years.) does not qualify.    Additional Screening:  Hepatitis C Screening:Completed 01/28/2015  Vision Screening: Recommended annual ophthalmology exams for early detection of glaucoma and other disorders of the eye. Is the patient up to date with their annual eye exam?  {YES/NO:21197} Who is the provider or what is the name of the office in which the patient attends annual eye exams? *** If pt is not established with a provider, would they like to be referred to a provider to establish care? {YES/NO:21197}.   Dental Screening: Recommended annual dental exams for proper oral hygiene  Community Resource Referral / Chronic Care Management: CRR required this visit?  {YES/NO:21197}  CCM required this visit?  {YES/NO:21197}     Plan:     I have personally reviewed and noted the following in the patient's chart:   . Medical and social history . Use of alcohol, tobacco or illicit drugs  . Current medications and supplements . Functional ability and status . Nutritional status . Physical activity . Advanced directives . List of other physicians . Hospitalizations, surgeries, and ER visits in previous 12 months . Vitals . Screenings to include cognitive, depression, and falls . Referrals and appointments  In addition, I have  reviewed and discussed with patient certain preventive protocols, quality metrics, and best practice recommendations. A written personalized care plan for preventive  services as well as general preventive health recommendations were provided to patient.   Due to this being a telephonic visit, the after visit summary with patients personalized plan was offered to patient via mail or my-chart. ***Patient declined at this time./ Patient would like to access on my-chart/ per request, patient was mailed a copy of AVS./ Patient preferred to pick up at office at next visit.   Marta Antu, LPN   [id number]  Nurse Health Advisor  Nurse Notes: ***

## 2020-03-16 ENCOUNTER — Ambulatory Visit: Payer: Medicare Other

## 2020-03-25 ENCOUNTER — Other Ambulatory Visit: Payer: Self-pay | Admitting: Nurse Practitioner

## 2020-03-25 DIAGNOSIS — Z1231 Encounter for screening mammogram for malignant neoplasm of breast: Secondary | ICD-10-CM

## 2020-05-10 ENCOUNTER — Inpatient Hospital Stay: Admission: RE | Admit: 2020-05-10 | Payer: Medicare Other | Source: Ambulatory Visit

## 2020-05-27 ENCOUNTER — Other Ambulatory Visit: Payer: Self-pay

## 2020-05-28 ENCOUNTER — Encounter: Payer: Self-pay | Admitting: Nurse Practitioner

## 2020-05-28 ENCOUNTER — Ambulatory Visit (INDEPENDENT_AMBULATORY_CARE_PROVIDER_SITE_OTHER): Payer: Medicare Other | Admitting: Nurse Practitioner

## 2020-05-28 VITALS — BP 114/60 | HR 80 | Temp 97.4°F | Ht 68.5 in | Wt 169.8 lb

## 2020-05-28 DIAGNOSIS — I10 Essential (primary) hypertension: Secondary | ICD-10-CM | POA: Diagnosis not present

## 2020-05-28 DIAGNOSIS — E1151 Type 2 diabetes mellitus with diabetic peripheral angiopathy without gangrene: Secondary | ICD-10-CM

## 2020-05-28 DIAGNOSIS — E785 Hyperlipidemia, unspecified: Secondary | ICD-10-CM | POA: Diagnosis not present

## 2020-05-28 DIAGNOSIS — E876 Hypokalemia: Secondary | ICD-10-CM | POA: Diagnosis not present

## 2020-05-28 MED ORDER — LISINOPRIL 5 MG PO TABS: 5.0000 mg | ORAL_TABLET | Freq: Every day | ORAL | 3 refills | Status: DC

## 2020-05-28 MED ORDER — POTASSIUM CHLORIDE CRYS ER 20 MEQ PO TBCR: 20.0000 meq | EXTENDED_RELEASE_TABLET | Freq: Every day | ORAL | 3 refills | Status: DC

## 2020-05-28 MED ORDER — HYDROCHLOROTHIAZIDE 25 MG PO TABS: 25.0000 mg | ORAL_TABLET | Freq: Every day | ORAL | 3 refills | Status: DC

## 2020-05-28 MED ORDER — ATORVASTATIN CALCIUM 40 MG PO TABS: 40.0000 mg | ORAL_TABLET | Freq: Every day | ORAL | 3 refills | Status: DC

## 2020-05-28 MED ORDER — GABAPENTIN 300 MG PO CAPS: 300.0000 mg | ORAL_CAPSULE | Freq: Three times a day (TID) | ORAL | 3 refills | Status: DC

## 2020-05-28 NOTE — Patient Instructions (Signed)
Maintain current medications  

## 2020-05-28 NOTE — Progress Notes (Signed)
Subjective:  Patient ID: Sandra Ray, female    DOB: 26-Oct-1945  Age: 75 y.o. MRN: [id number]  CC: Follow-up (6 month f/u for HTN and hyperlipidemia)  HPI Accompanied by husband.  Essential hypertension BP at goal with HCTZ and lisinopril. BP Readings from Last 3 Encounters:  05/28/20 114/60  02/12/20 (!) 146/74  11/28/19 130/82   Continue current medications  Hyperlipidemia LDL goal <70 LDL at goal with lovaza and atorvastatin  no fatigue, joint or muscle pain Lipid Panel     Component Value Date/Time   CHOL 117 02/02/2020 0814   TRIG 120.0 02/02/2020 0814   HDL 48.00 02/02/2020 0814   CHOLHDL 2 02/02/2020 0814   VLDL 24.0 02/02/2020 0814   LDLCALC 45 02/02/2020 0814   LDLDIRECT 69.0 08/19/2018 0904   Continue current medication  Reviewed past Medical, Social and Family history today.  Outpatient Medications Prior to Visit  Medication Sig Dispense Refill  . acetaminophen (TYLENOL) 650 MG CR tablet Take 650 mg by mouth every 8 (eight) hours as needed for pain.    . Coenzyme Q10 400 MG CAPS Take 400 mg by mouth daily.     . Dapagliflozin-metFORMIN HCl ER (XIGDUO XR) 07-998 MG TB24 Take 1 tablet by mouth 2 (two) times daily. Take 1 tablet by mouth twice daily. 180 tablet 4  . diclofenac Sodium (VOLTAREN) 1 % GEL Apply topically 4 (four) times daily.    Marland Kitchen doxycycline (VIBRA-TABS) 100 MG tablet Take 1 tablet (100 mg total) by mouth 2 (two) times daily. 20 tablet 0  . Dulaglutide (TRULICITY) 3 HK/7.4QV SOPN Inject 3 mg into the skin once a week. Inject 3mg  under the skin once weekly. 12 pen 2  . fluticasone (FLONASE) 50 MCG/ACT nasal spray Place 2 sprays into both nostrils daily. (Patient taking differently: Place 2 sprays into both nostrils daily as needed for allergies.) 16 g 0  . Garlic 9563 MG CAPS Take 1,000 mg by mouth daily.     Marland Kitchen glimepiride (AMARYL) 2 MG tablet TAKE 1 TABLET BY MOUTH  DAILY BEFORE BREAKFAST 90 tablet 3  . glucose blood (ONETOUCH VERIO) test strip  USE AS INSTRUCTED TO CHECK  BLOOD SUGAR TWICE DAILY 200 strip 3  . Lancets (ONETOUCH ULTRASOFT) lancets Use as instructed once day.  E11.40 100 each 5  . omega-3 acid ethyl esters (LOVAZA) 1 g capsule Take 1 capsule (1 g total) by mouth 2 (two) times daily. 60 capsule 5  . ondansetron (ZOFRAN) 4 MG tablet Take 1 tablet (4 mg total) by mouth as needed for nausea or vomiting. 30 tablet 1  . atorvastatin (LIPITOR) 40 MG tablet Take 1 tablet (40 mg total) by mouth daily at 6 PM. 90 tablet 3  . gabapentin (NEURONTIN) 300 MG capsule Take 1 capsule (300 mg total) by mouth 3 (three) times daily. 270 capsule 3  . hydrochlorothiazide (HYDRODIURIL) 25 MG tablet TAKE 1 TABLET BY MOUTH  DAILY 90 tablet 3  . lisinopril (ZESTRIL) 5 MG tablet TAKE 1 TABLET BY MOUTH  DAILY 90 tablet 3  . potassium chloride SA (KLOR-CON) 20 MEQ tablet TAKE 1 TABLET BY MOUTH  DAILY 90 tablet 3  . albuterol (VENTOLIN HFA) 108 (90 Base) MCG/ACT inhaler Inhale 2 puffs into the lungs every 6 (six) hours as needed for wheezing or shortness of breath.  (Patient not taking: Reported on 05/28/2020)    . Cholecalciferol (VITAMIN D-3) 5000 UNITS TABS Take 5,000 Units by mouth daily.  (Patient not taking: No sig  reported)    . cyanocobalamin 500 MCG tablet Take 500 mcg by mouth daily. (Patient not taking: No sig reported)    . gentamicin cream (GARAMYCIN) 0.1 % Apply 1 application topically 2 (two) times daily. (Patient not taking: Reported on 05/28/2020) 15 g 1  . oxybutynin (DITROPAN-XL) 5 MG 24 hr tablet Take 1 tablet (5 mg total) by mouth at bedtime. (Patient not taking: No sig reported) 30 tablet 5   No facility-administered medications prior to visit.    ROS See HPI  Objective:  BP 114/60 (BP Location: Right Arm, Patient Position: Sitting, Cuff Size: Normal)   Pulse 80   Temp (!) 97.4 F (36.3 C) (Temporal)   Ht 5' 8.5" (1.74 m)   Wt 169 lb 12.8 oz (77 kg)   SpO2 98%   BMI 25.44 kg/m   Physical Exam Vitals reviewed.   Cardiovascular:     Rate and Rhythm: Normal rate and regular rhythm.     Pulses: Normal pulses.     Heart sounds: Normal heart sounds.  Pulmonary:     Effort: Pulmonary effort is normal.     Breath sounds: Normal breath sounds.  Musculoskeletal:     Right lower leg: No edema.     Left lower leg: No edema.  Neurological:     Mental Status: She is alert and oriented to person, place, and time.  Psychiatric:        Mood and Affect: Mood normal.        Behavior: Behavior normal.        Thought Content: Thought content normal.    Assessment & Plan:  This visit occurred during the SARS-CoV-2 public health emergency.  Safety protocols were in place, including screening questions prior to the visit, additional usage of staff PPE, and extensive cleaning of exam room while observing appropriate contact time as indicated for disinfecting solutions.   Miyana was seen today for follow-up.  Diagnoses and all orders for this visit:  Essential hypertension  Hyperlipidemia LDL goal <70 -     atorvastatin (LIPITOR) 40 MG tablet; Take 1 tablet (40 mg total) by mouth daily at 6 PM. -     lisinopril (ZESTRIL) 5 MG tablet; Take 1 tablet (5 mg total) by mouth daily.  Type II diabetes mellitus with peripheral artery disease (HCC) -     gabapentin (NEURONTIN) 300 MG capsule; Take 1 capsule (300 mg total) by mouth 3 (three) times daily.  Essential hypertension, benign -     hydrochlorothiazide (HYDRODIURIL) 25 MG tablet; Take 1 tablet (25 mg total) by mouth daily. -     lisinopril (ZESTRIL) 5 MG tablet; Take 1 tablet (5 mg total) by mouth daily.  Hypokalemia -     potassium chloride SA (KLOR-CON) 20 MEQ tablet; Take 1 tablet (20 mEq total) by mouth daily.   Problem List Items Addressed This Visit      Cardiovascular and Mediastinum   Essential hypertension - Primary    BP at goal with HCTZ and lisinopril. BP Readings from Last 3 Encounters:  05/28/20 114/60  02/12/20 (!) 146/74  11/28/19  130/82   Continue current medications      Relevant Medications   atorvastatin (LIPITOR) 40 MG tablet   hydrochlorothiazide (HYDRODIURIL) 25 MG tablet   lisinopril (ZESTRIL) 5 MG tablet     Other   Hyperlipidemia LDL goal <70    LDL at goal with lovaza and atorvastatin  no fatigue, joint or muscle pain Lipid Panel  Component Value Date/Time   CHOL 117 02/02/2020 0814   TRIG 120.0 02/02/2020 0814   HDL 48.00 02/02/2020 0814   CHOLHDL 2 02/02/2020 0814   VLDL 24.0 02/02/2020 0814   LDLCALC 45 02/02/2020 0814   LDLDIRECT 69.0 08/19/2018 0904   Continue current medication      Relevant Medications   atorvastatin (LIPITOR) 40 MG tablet   hydrochlorothiazide (HYDRODIURIL) 25 MG tablet   lisinopril (ZESTRIL) 5 MG tablet    Other Visit Diagnoses    Type II diabetes mellitus with peripheral artery disease (HCC)       Relevant Medications   atorvastatin (LIPITOR) 40 MG tablet   gabapentin (NEURONTIN) 300 MG capsule   hydrochlorothiazide (HYDRODIURIL) 25 MG tablet   lisinopril (ZESTRIL) 5 MG tablet   Essential hypertension, benign       Relevant Medications   atorvastatin (LIPITOR) 40 MG tablet   hydrochlorothiazide (HYDRODIURIL) 25 MG tablet   lisinopril (ZESTRIL) 5 MG tablet   Hypokalemia       Relevant Medications   potassium chloride SA (KLOR-CON) 20 MEQ tablet      Follow-up: Return in about 6 months (around 11/28/2020) for HTN and , hyperlipidemia (fasting).  Wilfred Lacy, NP

## 2020-06-05 ENCOUNTER — Encounter: Payer: Self-pay | Admitting: Nurse Practitioner

## 2020-06-05 NOTE — Assessment & Plan Note (Addendum)
LDL at goal with lovaza and atorvastatin  no fatigue, joint or muscle pain with use of Co Q10 Lipid Panel     Component Value Date/Time   CHOL 117 02/02/2020 0814   TRIG 120.0 02/02/2020 0814   HDL 48.00 02/02/2020 0814   CHOLHDL 2 02/02/2020 0814   VLDL 24.0 02/02/2020 0814   LDLCALC 45 02/02/2020 0814   LDLDIRECT 69.0 08/19/2018 0904   Continue current medication

## 2020-06-05 NOTE — Assessment & Plan Note (Signed)
BP at goal with HCTZ and lisinopril. BP Readings from Last 3 Encounters:  05/28/20 114/60  02/12/20 (!) 146/74  11/28/19 130/82   Continue current medications

## 2020-06-14 ENCOUNTER — Other Ambulatory Visit: Payer: Medicare Other

## 2020-06-17 ENCOUNTER — Ambulatory Visit: Payer: Medicare Other | Admitting: Endocrinology

## 2020-06-28 ENCOUNTER — Inpatient Hospital Stay: Admission: RE | Admit: 2020-06-28 | Payer: Medicare Other | Source: Ambulatory Visit

## 2020-07-01 ENCOUNTER — Other Ambulatory Visit: Payer: Self-pay

## 2020-07-01 ENCOUNTER — Other Ambulatory Visit: Payer: Medicare Other

## 2020-07-01 ENCOUNTER — Other Ambulatory Visit (INDEPENDENT_AMBULATORY_CARE_PROVIDER_SITE_OTHER): Payer: Medicare Other

## 2020-07-01 DIAGNOSIS — E1142 Type 2 diabetes mellitus with diabetic polyneuropathy: Secondary | ICD-10-CM | POA: Diagnosis not present

## 2020-07-01 DIAGNOSIS — E1165 Type 2 diabetes mellitus with hyperglycemia: Secondary | ICD-10-CM

## 2020-07-01 LAB — BASIC METABOLIC PANEL
BUN: 15 mg/dL (ref 6–23)
CO2: 29 mEq/L (ref 19–32)
Calcium: 10.2 mg/dL (ref 8.4–10.5)
Chloride: 99 mEq/L (ref 96–112)
Creatinine, Ser: 0.56 mg/dL (ref 0.40–1.20)
GFR: 89.58 mL/min (ref >60.00)
Glucose, Bld: 97 mg/dL (ref 70–99)
Potassium: 4.3 mEq/L (ref 3.5–5.1)
Sodium: 138 mEq/L (ref 135–145)

## 2020-07-01 LAB — MICROALBUMIN / CREATININE URINE RATIO
Creatinine,U: 78.9 mg/dL
Microalb Creat Ratio: 2.8 mg/g (ref 0.0–30.0)
Microalb, Ur: 2.2 mg/dL — ABNORMAL HIGH (ref 0.0–1.9)

## 2020-07-01 LAB — URINALYSIS, ROUTINE W REFLEX MICROSCOPIC
Bilirubin Urine: NEGATIVE
Ketones, ur: NEGATIVE
Leukocytes,Ua: NEGATIVE
Nitrite: NEGATIVE
Specific Gravity, Urine: 1.015 (ref 1.000–1.030)
Total Protein, Urine: NEGATIVE
Urine Glucose: 500 — AB
Urobilinogen, UA: 0.2 (ref 0.0–1.0)
pH: 6 (ref 5.0–8.0)

## 2020-07-01 LAB — HEMOGLOBIN A1C: Hgb A1c MFr Bld: 6.4 % (ref 4.6–6.5)

## 2020-07-05 ENCOUNTER — Other Ambulatory Visit: Payer: Self-pay | Admitting: *Deleted

## 2020-07-05 ENCOUNTER — Other Ambulatory Visit: Payer: Self-pay

## 2020-07-05 ENCOUNTER — Ambulatory Visit: Payer: Medicare Other | Admitting: Endocrinology

## 2020-07-05 ENCOUNTER — Encounter: Payer: Self-pay | Admitting: Endocrinology

## 2020-07-05 VITALS — BP 132/78 | HR 74 | Ht 68.5 in | Wt 168.0 lb

## 2020-07-05 DIAGNOSIS — E782 Mixed hyperlipidemia: Secondary | ICD-10-CM | POA: Diagnosis not present

## 2020-07-05 DIAGNOSIS — E1142 Type 2 diabetes mellitus with diabetic polyneuropathy: Secondary | ICD-10-CM | POA: Diagnosis not present

## 2020-07-05 DIAGNOSIS — E1165 Type 2 diabetes mellitus with hyperglycemia: Secondary | ICD-10-CM

## 2020-07-05 LAB — POCT GLUCOSE (DEVICE FOR HOME USE): POC Glucose: 78 mg/dl (ref 70–99)

## 2020-07-05 MED ORDER — TRULICITY 3 MG/0.5ML ~~LOC~~ SOAJ: 3.0000 mg | SUBCUTANEOUS | 3 refills | Status: DC

## 2020-07-05 NOTE — Patient Instructions (Addendum)
Cut Glimeperide in am to 1/2 daily  Check blood sugars on waking up days a week  Also check blood sugars about 2 hours after meals and do this after different meals by rotation  Recommended blood sugar levels on waking up are 90-130 and about 2 hours after meal is 130-160  Please bring your blood sugar monitor to each visit, thank you

## 2020-07-05 NOTE — Progress Notes (Signed)
Patient ID: Sandra Ray, female   DOB: 1945-04-06, 75 y.o.   MRN: [id number]            Reason for Appointment: Follow-up    History of Present Illness:          Date of diagnosis of type 2 diabetes mellitus: 1996       Background history:   She had a high blood sugar on lab work which diagnosed her diabetes and she was most likely treated with metformin for a few years She has also taking glipizide for several years She was tried on Actos which was started because of significant weight gain. Over the last few years she has been taking either  Januvia or Tradjenta with her metformin in combination with fair control Her A1c has been below 8% only twice in the last 2-3 years  Recent history:    Non-insulin hypoglycemic drugs: Amaryl 2 mg in the morning, Trulicity  3 mg weekly,  Xigduo 07/998, 1 tablet twice daily  Current management, blood sugar patterns and problems identified:  Her A1c has stabilized at 6.4   She did not bring her monitor for download  She was also told on her last visit to reduce Amaryl to half tablet but she forgot  Although she still has good control and she will occasionally feel a little hypoglycemic but will not usually test her sugar but get something to eat  Today after eating only yogurt for lunch her blood sugar is only 78  Usually trying to watch portions and avoid large amount of carbohydrates like bread  She does not walk much because of hip pain  No nausea with Trulicity 3 mg  Usually does not have readings over 160 postprandially at home reportedly and generally not below 80        Side effects from medications have been: Weight gain from Actos, nausea from high dose metformin   Glucose monitoring:  done <1 times a day         Glucometer: One Touch Verio     Blood Glucose readings as above, previously:   PRE-MEAL Fasting Lunch Dinner Bedtime Overall  Glucose range:  100-124  155, 166  78, 157  78-166  Mean/median:  111    121    POST-MEAL PC Breakfast PC Lunch PC Dinner  Glucose range:   89-126  132, 148  Mean/median:                      Exercise:  Minimal  Weight history: Previous range 200-254  Wt Readings from Last 3 Encounters:  07/05/20 168 lb (76.2 kg)  05/28/20 169 lb 12.8 oz (77 kg)  02/12/20 167 lb 12.8 oz (76.1 kg)    Glycemic control:   Lab Results  Component Value Date   HGBA1C 6.4 07/01/2020   HGBA1C 6.4 02/02/2020   HGBA1C 6.6 (H) 10/07/2019   Lab Results  Component Value Date   MICROALBUR 2.2 (H) 07/01/2020   LDLCALC 45 02/02/2020   CREATININE 0.56 07/01/2020   Lab Results  Component Value Date   MICRALBCREAT 2.8 07/01/2020    Lab Results  Component Value Date   FRUCTOSAMINE 237 03/28/2019   FRUCTOSAMINE 263 05/14/2018   FRUCTOSAMINE 328 (H) 07/07/2016      Allergies as of 07/05/2020      Reactions   Penicillins    Causes yeast infections Did it involve swelling of the face/tongue/throat, SOB, or low BP? No Did it involve  sudden or severe rash/hives, skin peeling, or any reaction on the inside of your mouth or nose? No Did you need to seek medical attention at a hospital or doctor's office? No When did it last happen?2018 If all above answers are "NO", may proceed with cephalosporin use.   Amoxicillin Other (See Comments)   Pt states it causes her a yeast infection  Causes yeast infections Did it involve swelling of the face/tongue/throat, SOB, or low BP? No Did it involve sudden or severe rash/hives, skin peeling, or any reaction on the inside of your mouth or nose? No Did you need to seek medical attention at a hospital or doctor's office? No When did it last happen?2018 If all above answers are "NO", may proceed with cephalosporin use.   Erythromycin    Passed out while taking      Medication List       Accurate as of July 05, 2020  3:08 PM. If you have any questions, ask your nurse or doctor.        acetaminophen 650 MG CR  tablet Commonly known as: TYLENOL Take 650 mg by mouth every 8 (eight) hours as needed for pain.   atorvastatin 40 MG tablet Commonly known as: LIPITOR Take 1 tablet (40 mg total) by mouth daily at 6 PM.   Coenzyme Q10 400 MG Caps Take 400 mg by mouth daily.   diclofenac Sodium 1 % Gel Commonly known as: VOLTAREN Apply topically 4 (four) times daily.   doxycycline 100 MG tablet Commonly known as: VIBRA-TABS Take 1 tablet (100 mg total) by mouth 2 (two) times daily.   fluticasone 50 MCG/ACT nasal spray Commonly known as: FLONASE Place 2 sprays into both nostrils daily. What changed:   when to take this  reasons to take this   gabapentin 300 MG capsule Commonly known as: NEURONTIN Take 1 capsule (300 mg total) by mouth 3 (three) times daily.   Garlic 0258 MG Caps Take 1,000 mg by mouth daily.   glimepiride 2 MG tablet Commonly known as: AMARYL TAKE 1 TABLET BY MOUTH  DAILY BEFORE BREAKFAST   hydrochlorothiazide 25 MG tablet Commonly known as: HYDRODIURIL Take 1 tablet (25 mg total) by mouth daily.   lisinopril 5 MG tablet Commonly known as: ZESTRIL Take 1 tablet (5 mg total) by mouth daily.   omega-3 acid ethyl esters 1 g capsule Commonly known as: LOVAZA Take 1 capsule (1 g total) by mouth 2 (two) times daily.   ondansetron 4 MG tablet Commonly known as: Zofran Take 1 tablet (4 mg total) by mouth as needed for nausea or vomiting.   onetouch ultrasoft lancets Use as instructed once day.  E11.40   OneTouch Verio test strip Generic drug: glucose blood USE AS INSTRUCTED TO CHECK  BLOOD SUGAR TWICE DAILY   potassium chloride SA 20 MEQ tablet Commonly known as: KLOR-CON Take 1 tablet (20 mEq total) by mouth daily.   Trulicity 3 NI/7.7OE Sopn Generic drug: Dulaglutide Inject 3 mg into the skin once a week. Inject 3mg  under the skin once weekly.   Xigduo XR 07-998 MG Tb24 Generic drug: Dapagliflozin-metFORMIN HCl ER Take 1 tablet by mouth 2 (two) times  daily. Take 1 tablet by mouth twice daily.       Allergies:  Allergies  Allergen Reactions  . Penicillins     Causes yeast infections Did it involve swelling of the face/tongue/throat, SOB, or low BP? No Did it involve sudden or severe rash/hives, skin peeling, or any  reaction on the inside of your mouth or nose? No Did you need to seek medical attention at a hospital or doctor's office? No When did it last happen?2018 If all above answers are "NO", may proceed with cephalosporin use.   Marland Kitchen Amoxicillin Other (See Comments)    Pt states it causes her a yeast infection  Causes yeast infections Did it involve swelling of the face/tongue/throat, SOB, or low BP? No Did it involve sudden or severe rash/hives, skin peeling, or any reaction on the inside of your mouth or nose? No Did you need to seek medical attention at a hospital or doctor's office? No When did it last happen?2018 If all above answers are "NO", may proceed with cephalosporin use.  . Erythromycin     Passed out while taking    Past Medical History:  Diagnosis Date  . Arthritis   . Breast abscess 2000   right breast  . Carpal tunnel syndrome   . Complication of anesthesia   . Degenerative disc disease   . Diabetes mellitus type II   . Eczema of hand   . Hx of colonic polyps 12/02/2014   2004 - 2 diminutive polyps   . Hyperlipidemia   . Hypertension   . Metatarsal deformity   . Ovarian cyst 07/2017   left   . PONV (postoperative nausea and vomiting)   . Shingles 2000  . Tubular adenoma of colon     Past Surgical History:  Procedure Laterality Date  . ABDOMINAL HYSTERECTOMY  1981   menorrhagia  . ABSCESS DRAINAGE Right    breast  . APPENDECTOMY  1966   done with CSxn. Prophylactic  . BREAST BIOPSY Left   . carpel tunnel Right   . CESAREAN SECTION     x2  . COLONOSCOPY    . LUMBAR LAMINECTOMY     [id number]. 2011 L4,L5,S1  . Right rotator cuff repair 09/2015    . ROBOTIC ASSISTED SALPINGO  OOPHERECTOMY Left 04/16/2018   Procedure: XI ROBOTIC ASSISTED  LEFT SALPINGO OOPHORECTOMY;  Surgeon: [name], MD;  Location: [facility];  Service: Gynecology;  Laterality: Left;  . TUBAL LIGATION      Family History  Problem Relation Age of Onset  . Hypertension Father   . Diabetes Mother   . Stroke Mother   . Hypertension Mother   . High Cholesterol Mother   . Heart disease Mother   . Diabetes Brother   . Breast cancer Daughter 78       unsure if did genetic testing  . Breast cancer Maternal Aunt   . Breast cancer Sister 24  . Colon polyps Brother     Social History:  reports that she has never smoked. She has never used smokeless tobacco. She reports that she does not drink alcohol and does not use drugs.   Review of Systems   Lipid history: Has had high triglycerides and LDL, LDL below 100 on atorvastatin 40 mg prescribed by her PCP  Hypertriglyceridemia treated with Lovaza, triglycerides as follows   Lab Results  Component Value Date   CHOL 117 02/02/2020   CHOL 120 06/27/2019   CHOL 137 08/19/2018   Lab Results  Component Value Date   HDL 48.00 02/02/2020   HDL 43.90 06/27/2019   HDL 42.80 08/19/2018   Lab Results  Component Value Date   LDLCALC 45 02/02/2020   LDLCALC 48 06/27/2019   LDLCALC 47 04/03/2018   Lab Results  Component Value Date   TRIG  120.0 02/02/2020   TRIG 144.0 06/27/2019   TRIG 222.0 (H) 08/19/2018   Lab Results  Component Value Date   CHOLHDL 2 02/02/2020   CHOLHDL 3 06/27/2019   CHOLHDL 3 08/19/2018   Lab Results  Component Value Date   LDLDIRECT 69.0 08/19/2018   LDLDIRECT 80.0 02/06/2018   LDLDIRECT 91.0 08/22/2016            Hypertension: Usually controlled with 5 mg Lisinopril and 25 mg HCTZ Also on Farxiga   BP Readings from Last 3 Encounters:  07/05/20 132/78  05/28/20 114/60  02/12/20 (!) 146/74   Lab Results  Component Value Date   K 4.3 07/01/2020    Most recent eye exam was 11/21  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 POC Glucose 07/05/2020 78  70 - 99 mg/dl Final  Lab on 07/01/2020  Component Date Value Ref Range Status  . Color, Urine 07/01/2020 YELLOW  Yellow;Lt. Yellow;Straw;Dark Yellow;Amber;Green;Red;Brown Final  . APPearance 07/01/2020 CLEAR  Clear;Turbid;Slightly Cloudy;Cloudy Final  . Specific Gravity, Urine 07/01/2020 1.015  1.000 - 1.030 Final  . pH 07/01/2020 6.0  5.0 - 8.0 Final  . Total Protein, Urine 07/01/2020 NEGATIVE  Negative Final  . Urine Glucose 07/01/2020 500* Negative Final  . Ketones, ur 07/01/2020 NEGATIVE  Negative Final  . Bilirubin Urine 07/01/2020 NEGATIVE  Negative Final  . Hgb urine dipstick 07/01/2020 TRACE-INTACT* Negative Final  . Urobilinogen, UA 07/01/2020 0.2  0.0 - 1.0 Final  . Leukocytes,Ua 07/01/2020 NEGATIVE  Negative Final  . Nitrite 07/01/2020 NEGATIVE  Negative Final  . WBC, UA 07/01/2020 0-2/hpf  0-2/hpf Final  . RBC / HPF 07/01/2020 0-2/hpf  0-2/hpf Final  . Squamous Epithelial / LPF 07/01/2020 Rare(0-4/hpf)  Rare(0-4/hpf) Final  . Microalb, Ur 07/01/2020 2.2* 0.0 - 1.9 mg/dL Final  . Creatinine,U 07/01/2020 78.9  mg/dL Final  . Microalb Creat Ratio 07/01/2020 2.8  0.0 - 30.0 mg/g Final  . Sodium 07/01/2020 138  135 - 145 mEq/L Final  . Potassium 07/01/2020 4.3  3.5 - 5.1 mEq/L Final  . Chloride 07/01/2020 99  96 - 112 mEq/L Final  . CO2 07/01/2020 29  19 - 32 mEq/L Final  . Glucose, Bld 07/01/2020 97  70 - 99 mg/dL Final  . BUN 07/01/2020 15  6 - 23 mg/dL Final  . Creatinine, Ser 07/01/2020 0.56  0.40 - 1.20 mg/dL Final  . GFR 07/01/2020 89.58  >60.00 mL/min Final   Calculated using the CKD-EPI Creatinine Equation (2021)  . Calcium 07/01/2020 10.2  8.4 - 10.5 mg/dL Final  . Hgb A1c MFr Bld 07/01/2020 6.4  4.6 - 6.5 % Final   Glycemic Control Guidelines for People with  Diabetes:Non Diabetic:  <6%Goal of Therapy: <7%Additional Action Suggested:  >8%     Physical Examination:  BP 132/78   Pulse 74   Ht 5' 8.5" (1.74 m)   Wt 168 lb (76.2 kg)   SpO2 97%   BMI 25.17 kg/m     ASSESSMENT:  Diabetes type 2, non-insulin-dependent  See history of present illness for detailed discussion of current diabetes management, blood sugar patterns and problems identified   Her A1c is excellent at 6.4  She is on a 4 drug regimen including Trulicity 3 mg, Xigduo and 2 mg  Amaryl  Blood sugars are fairly consistent and she is  having excellent readings at home although could not download her meter which she forgot She also forgot to reduce her Amaryl as directed on the last visit  She may occasionally have mild hypoglycemic symptoms but not documented to have low sugars Reportedly does not have high readings after meals   HYPERTENSION: Blood pressure is well controlled  LIPIDS: Excellent control and triglycerides are last 120 LDL controlled with atorvastatin   PLAN:   She will reduce her Amaryl to half tablet in the morning and if she still has readings in the 70s in the afternoon can stop it completely She will bring her meter on the next visit and reminded her to check periodically at different times including when she feels hypoglycemic To call if she has any questions regarding her medications  There are no Patient Instructions on file for this visit.       Elayne Snare 07/05/2020, 3:08 PM   Note: This office note was prepared with Dragon voice recognition system technology. Any transcriptional errors that result from this process are unintentional.

## 2020-07-06 ENCOUNTER — Other Ambulatory Visit: Payer: Self-pay | Admitting: *Deleted

## 2020-07-06 DIAGNOSIS — E1165 Type 2 diabetes mellitus with hyperglycemia: Secondary | ICD-10-CM

## 2020-08-02 NOTE — Progress Notes (Signed)
Subjective:   Sandra Ray is a 75 y.o. female who presents for Medicare Annual (Subsequent) preventive examination.  I connected with Copelyn today by telephone and verified that I am speaking with the correct person using two identifiers. Location patient: home Location provider: work Persons participating in the virtual visit: patient, Marine scientist.    I discussed the limitations, risks, security and privacy concerns of performing an evaluation and management service by telephone and the availability of in person appointments. I also discussed with the patient that there may be a patient responsible charge related to this service. The patient expressed understanding and verbally consented to this telephonic visit.    Interactive audio and video telecommunications were attempted between this provider and patient, however failed, due to patient having technical difficulties OR patient did not have access to video capability.  We continued and completed visit with audio only.  Some vital signs may be absent or patient reported.   Time Spent with patient on telephone encounter: 20 minutes   Review of Systems     Cardiac Risk Factors include: advanced age (>57men, >54 women);diabetes mellitus;dyslipidemia;hypertension     Objective:    Today's Vitals   08/03/20 1116  Weight: 168 lb (76.2 kg)  Height: 5' 8.5" (1.74 m)   Body mass index is 25.17 kg/m.  Advanced Directives 08/03/2020 05/12/2018 04/24/2018 04/12/2018 04/04/2018 03/11/2018 03/28/2016  Does Patient Have a Medical Advance Directive? No No No No No No No  Would patient like information on creating a medical advance directive? Yes (MAU/Ambulatory/Procedural Areas - Information given) - No - Patient declined No - Patient declined No - Patient declined Yes (MAU/Ambulatory/Procedural Areas - Information given) Yes (MAU/Ambulatory/Procedural Areas - Information given)    Current Medications (verified) Outpatient Encounter Medications as  of 08/03/2020  Medication Sig  . acetaminophen (TYLENOL) 650 MG CR tablet Take 650 mg by mouth every 8 (eight) hours as needed for pain.  Marland Kitchen atorvastatin (LIPITOR) 40 MG tablet Take 1 tablet (40 mg total) by mouth daily at 6 PM.  . Coenzyme Q10 400 MG CAPS Take 400 mg by mouth daily.   . Dapagliflozin-metFORMIN HCl ER (XIGDUO XR) 07-998 MG TB24 Take 1 tablet by mouth 2 (two) times daily. Take 1 tablet by mouth twice daily.  . diclofenac Sodium (VOLTAREN) 1 % GEL Apply topically 4 (four) times daily.  . Dulaglutide (TRULICITY) 3 TD/1.7OH SOPN Inject 3 mg into the skin once a week. Inject 3mg  under the skin once weekly.  . fluticasone (FLONASE) 50 MCG/ACT nasal spray Place 2 sprays into both nostrils daily. (Patient taking differently: Place 2 sprays into both nostrils daily as needed for allergies.)  . gabapentin (NEURONTIN) 300 MG capsule Take 1 capsule (300 mg total) by mouth 3 (three) times daily.  . Garlic 6073 MG CAPS Take 1,000 mg by mouth daily.   Marland Kitchen glimepiride (AMARYL) 2 MG tablet TAKE 1 TABLET BY MOUTH  DAILY BEFORE BREAKFAST  . glucose blood (ONETOUCH VERIO) test strip USE AS INSTRUCTED TO CHECK  BLOOD SUGAR TWICE DAILY  . hydrochlorothiazide (HYDRODIURIL) 25 MG tablet Take 1 tablet (25 mg total) by mouth daily.  . Lancets (ONETOUCH ULTRASOFT) lancets Use as instructed once day.  E11.40  . lisinopril (ZESTRIL) 5 MG tablet Take 1 tablet (5 mg total) by mouth daily.  Marland Kitchen omega-3 acid ethyl esters (LOVAZA) 1 g capsule Take 1 capsule (1 g total) by mouth 2 (two) times daily.  . ondansetron (ZOFRAN) 4 MG tablet Take 1 tablet (4  mg total) by mouth as needed for nausea or vomiting.  . potassium chloride SA (KLOR-CON) 20 MEQ tablet Take 1 tablet (20 mEq total) by mouth daily.   No facility-administered encounter medications on file as of 08/03/2020.    Allergies (verified) Penicillins, Amoxicillin, and Erythromycin   History: Past Medical History:  Diagnosis Date  . Arthritis   . Breast  abscess 2000   right breast  . Carpal tunnel syndrome   . Complication of anesthesia   . Degenerative disc disease   . Diabetes mellitus type II   . Eczema of hand   . Hx of colonic polyps 12/02/2014   2004 - 2 diminutive polyps   . Hyperlipidemia   . Hypertension   . Metatarsal deformity   . Ovarian cyst 07/2017   left   . PONV (postoperative nausea and vomiting)   . Shingles 2000  . Tubular adenoma of colon    Past Surgical History:  Procedure Laterality Date  . ABDOMINAL HYSTERECTOMY  1981   menorrhagia  . ABSCESS DRAINAGE Right    breast  . APPENDECTOMY  1966   done with CSxn. Prophylactic  . BREAST BIOPSY Left   . carpel tunnel Right   . CESAREAN SECTION     x2  . COLONOSCOPY    . LUMBAR LAMINECTOMY     [id number]. 2011 L4,L5,S1  . Right rotator cuff repair 09/2015    . ROBOTIC ASSISTED SALPINGO OOPHERECTOMY Left 04/16/2018   Procedure: XI ROBOTIC ASSISTED  LEFT SALPINGO OOPHORECTOMY;  Surgeon: [name], MD;  Location: [facility];  Service: Gynecology;  Laterality: Left;  . TUBAL LIGATION     Family History  Problem Relation Age of Onset  . Hypertension Father   . Diabetes Mother   . Stroke Mother   . Hypertension Mother   . High Cholesterol Mother   . Heart disease Mother   . Diabetes Brother   . Breast cancer Daughter 22       unsure if did genetic testing  . Breast cancer Maternal Aunt   . Breast cancer Sister 70  . Colon polyps Brother    Social History   Socioeconomic History  . Marital status: Married    Spouse name: Not on file  . Number of children: 2  . Years of education: Not on file  . Highest education level: Not on file  Occupational History  . Not on file  Tobacco Use  . Smoking status: Never Smoker  . Smokeless tobacco: Never Used  Vaping Use  . Vaping Use: Never used  Substance and Sexual Activity  . Alcohol use: No  . Drug use: No  . Sexual activity: Not Currently  Other Topics Concern  . Not on file  Social History  Narrative   Married 2 daughters   Not working outside home   no caffeine   12/02/2014   Social Determinants of Health   Financial Resource Strain: Low Risk   . Difficulty of Paying Living Expenses: Not hard at all  Food Insecurity: No Food Insecurity  . Worried About Charity fundraiser in the Last Year: Never true  . Ran Out of Food in the Last Year: Never true  Transportation Needs: No Transportation Needs  . Lack of Transportation (Medical): No  . Lack of Transportation (Non-Medical): No  Physical Activity: Inactive  . Days of Exercise per Week: 0 days  . Minutes of Exercise per Session: 0 min  Stress: No Stress Concern Present  .  Feeling of Stress : Not at all  Social Connections: Moderately Integrated  . Frequency of Communication with Friends and Family: More than three times a week  . Frequency of Social Gatherings with Friends and Family: More than three times a week  . Attends Religious Services: More than 4 times per year  . Active Member of Clubs or Organizations: No  . Attends Archivist Meetings: Never  . Marital Status: Married    Tobacco Counseling Counseling given: Not Answered   Clinical Intake:  Pre-visit preparation completed: Yes  Pain : No/denies pain     Nutritional Status: BMI 25 -29 Overweight Nutritional Risks: None Diabetes: Yes CBG done?: No Did pt. bring in CBG monitor from home?: No (phone visit)  How often do you need to have someone help you when you read instructions, pamphlets, or other written materials from your doctor or pharmacy?: 1 - Never  Diabetes:  Is the patient diabetic?  Yes  If diabetic, was a CBG obtained today?  No  Did the patient bring in their glucometer from home?  No  phone visit How often do you monitor your CBG's? daily.   Financial Strains and Diabetes Management:  Are you having any financial strains with the device, your supplies or your medication? No .  Does the patient want to be seen by  Chronic Care Management for management of their diabetes?  No  Would the patient like to be referred to a Nutritionist or for Diabetic Management?  No   Diabetic Exams:  Diabetic Eye Exam: Completed 01/14/2020.   Diabetic Foot Exam: Completed 10/09/2019.   Interpreter Needed?: No  Information entered by :: Caroleen Hamman LPN   Activities of Daily Living In your present state of health, do you have any difficulty performing the following activities: 08/03/2020  Hearing? N  Vision? N  Difficulty concentrating or making decisions? Y  Comment occasionally  Walking or climbing stairs? N  Dressing or bathing? N  Doing errands, shopping? N  Preparing Food and eating ? N  Using the Toilet? N  In the past six months, have you accidently leaked urine? Y  Comment occasionally  Do you have problems with loss of bowel control? N  Managing your Medications? N  Managing your Finances? N  Housekeeping or managing your Housekeeping? N  Some recent data might be hidden    Patient Care Team: Nche, Charlene Brooke, NP as PCP - General (Internal Medicine) Sheard, Briscoe Burns, DPM (Inactive) as Consulting Physician (Podiatry) [place] Comment, My Diaz, [place] as Referring Physician (Optometry)  Indicate any recent Medical Services you may have received from other than [facility] providers in the past year (date may be approximate).     Assessment:   This is a routine wellness examination for Katalina.  Hearing/Vision screen  Hearing Screening   125Hz  250Hz  500Hz  1000Hz  2000Hz  3000Hz  4000Hz  6000Hz  8000Hz   Right ear:           Left ear:           Comments: No issues  Vision Screening Comments: Wears glasses Last eye exam-01/2020-Dr. Tamala Julian  Dietary issues and exercise activities discussed: Current Exercise Habits: The patient does not participate in regular exercise at present, Exercise limited by: None identified  Goals Addressed            This Visit's Progress   . Patient Stated       Maintain current  health      Depression Screen PHQ 2/9 Scores 08/03/2020 04/03/2018  03/11/2018 08/09/2017 07/13/2016 03/28/2016 11/18/2015  PHQ - 2 Score 0 0 0 0 0 0 0    Fall Risk Fall Risk  08/03/2020 04/03/2018 03/11/2018 08/09/2017 07/13/2016  Falls in the past year? 0 0 0 No Yes  Comment - - - - -  Number falls in past yr: 0 - - - 1  Injury with Fall? 0 - - - Yes  Comment - - - - -  Risk for fall due to : - - - - -  Risk for fall due to: Comment - - - - -  Follow up Falls prevention discussed - - - Falls prevention discussed    FALL RISK PREVENTION PERTAINING TO THE HOME:  Any stairs in or around the home? Yes  If so, are there any without handrails? No  Home free of loose throw rugs in walkways, pet beds, electrical cords, etc? Yes  Adequate lighting in your home to reduce risk of falls? Yes   ASSISTIVE DEVICES UTILIZED TO PREVENT FALLS:  Life alert? No  Use of a cane, walker or w/c? No  Grab bars in the bathroom? Yes  Shower chair or bench in shower? No  Elevated toilet seat or a handicapped toilet? No   TIMED UP AND GO:  Was the test performed? No . Phone visit   Cognitive Function: MMSE - Mini Mental State Exam 03/28/2016  Orientation to time 5  Orientation to Place 5  Registration 3  Attention/ Calculation 5  Recall 3  Language- name 2 objects 2  Language- repeat 1  Language- follow 3 step command 3  Language- read & follow direction 1  Write a sentence 1  Copy design 1  Total score 30     6CIT Screen 08/03/2020  What Year? 0 points  What month? 0 points  What time? 0 points  Count back from 20 0 points  Months in reverse 2 points  Repeat phrase 2 points  Total Score 4    Immunizations Immunization History  Administered Date(s) Administered  . Influenza Split 12/19/2011  . Influenza Whole 12/17/2008, 12/22/2009  . Influenza, High Dose Seasonal PF 12/25/2014, 11/18/2015, 11/23/2016, 12/14/2017, 10/31/2018  . Influenza,inj,Quad PF,6+ Mos 12/18/2012, 12/23/2013  .  Influenza,inj,quad, With Preservative 12/11/2016  . Influenza-Unspecified 11/24/2019  . PFIZER(Purple Top)SARS-COV-2 Vaccination 04/18/2019, 05/09/2019, 12/06/2019, 06/14/2020  . Pneumococcal Conjugate-13 12/23/2013, 08/30/2015  . Pneumococcal Polysaccharide-23 06/19/2011, 01/16/2017  . Td 02/23/2004  . Tdap 01/30/2015  . Zoster 03/28/2010    TDAP status: Up to date  Flu Vaccine status: Up to date  Pneumococcal vaccine status: Up to date  Covid-19 vaccine status: Completed vaccines  Qualifies for Shingles Vaccine? Yes   Zostavax completed Yes   Shingrix Completed?: No.    Education has been provided regarding the importance of this vaccine. Patient has been advised to call insurance company to determine out of pocket expense if they have not yet received this vaccine. Advised may also receive vaccine at local pharmacy or Health Dept. Verbalized acceptance and understanding.  Screening Tests Health Maintenance  Topic Date Due  . FOOT EXAM  10/08/2020  . INFLUENZA VACCINE  10/11/2020  . HEMOGLOBIN A1C  12/31/2020  . OPHTHALMOLOGY EXAM  01/13/2021  . COLONOSCOPY (Pts 45-50yrs Insurance coverage will need to be confirmed)  01/18/2025  . TETANUS/TDAP  01/29/2025  . DEXA SCAN  Completed  . COVID-19 Vaccine  Completed  . Hepatitis C Screening  Completed  . PNA vac Low Risk Adult  Completed  . HPV VACCINES  Aged Out    Health Maintenance  There are no preventive care reminders to display for this patient.  Colorectal cancer screening: No longer required.   Mammogram status: Scheduled for 08/16/20  Bone Density status: Ordered today. Pt provided with contact info and advised to call to schedule appt.  Lung Cancer Screening: (Low Dose CT Chest recommended if Age 47-80 years, 30 pack-year currently smoking OR have quit w/in 15years.) does not qualify.     Additional Screening:  Hepatitis C Screening:Completed 01/28/2015  Vision Screening: Recommended annual ophthalmology  exams for early detection of glaucoma and other disorders of the eye. Is the patient up to date with their annual eye exam?  Yes  Who is the provider or what is the name of the office in which the patient attends annual eye exams? Dr. Tamala Julian   Dental Screening: Recommended annual dental exams for proper oral hygiene  Community Resource Referral / Chronic Care Management: CRR required this visit?  No   CCM required this visit?  No      Plan:     I have personally reviewed and noted the following in the patient's chart:   . Medical and social history . Use of alcohol, tobacco or illicit drugs  . Current medications and supplements including opioid prescriptions.  . Functional ability and status . Nutritional status . Physical activity . Advanced directives . List of other physicians . Hospitalizations, surgeries, and ER visits in previous 12 months . Vitals . Screenings to include cognitive, depression, and falls . Referrals and appointments  In addition, I have reviewed and discussed with patient certain preventive protocols, quality metrics, and best practice recommendations. A written personalized care plan for preventive services as well as general preventive health recommendations were provided to patient.   Due to this being a telephonic visit, the after visit summary with patients personalized plan was offered to patient via mail or my-chart. Patient would like to access on my-chart.   Marta Antu, LPN   [id number]  Nurse Health Advisor  Nurse Notes: None

## 2020-08-03 ENCOUNTER — Ambulatory Visit (INDEPENDENT_AMBULATORY_CARE_PROVIDER_SITE_OTHER): Payer: Medicare Other

## 2020-08-03 VITALS — Ht 68.5 in | Wt 168.0 lb

## 2020-08-03 DIAGNOSIS — Z780 Asymptomatic menopausal state: Secondary | ICD-10-CM | POA: Diagnosis not present

## 2020-08-03 DIAGNOSIS — Z0000 Encounter for general adult medical examination without abnormal findings: Secondary | ICD-10-CM | POA: Diagnosis not present

## 2020-08-03 NOTE — Patient Instructions (Signed)
Sandra Ray , Thank you for taking time to complete your Medicare Wellness Visit. I appreciate your ongoing commitment to your health goals. Please review the following plan we discussed and let me know if I can assist you in the future.   Screening recommendations/referrals: Colonoscopy: No longer required Mammogram: Scheduled for 08/16/2020 Bone Density: Ordered today. Recommended yearly ophthalmology/optometry visit for glaucoma screening and checkup Recommended yearly dental visit for hygiene and checkup  Vaccinations: Influenza vaccine: Up to date Pneumococcal vaccine: Completed vaccines Tdap vaccine: Up to date-Due-01/29/2025 Shingles vaccine: Discuss with pharmacy   Covid-19:Up to date  Advanced directives: Information mailed  Conditions/risks identified: See problem list  Next appointment: Follow up in one year for your annual wellness visit 08/09/2021 @ 11:15   Preventive Care 65 Years and Older, Female Preventive care refers to lifestyle choices and visits with your health care provider that can promote health and wellness. What does preventive care include?  A yearly physical exam. This is also called an annual well check.  Dental exams once or twice a year.  Routine eye exams. Ask your health care provider how often you should have your eyes checked.  Personal lifestyle choices, including:  Daily care of your teeth and gums.  Regular physical activity.  Eating a healthy diet.  Avoiding tobacco and drug use.  Limiting alcohol use.  Practicing safe sex.  Taking low-dose aspirin every day.  Taking vitamin and mineral supplements as recommended by your health care provider. What happens during an annual well check? The services and screenings done by your health care provider during your annual well check will depend on your age, overall health, lifestyle risk factors, and family history of disease. Counseling  Your health care provider may ask you questions  about your:  Alcohol use.  Tobacco use.  Drug use.  Emotional well-being.  Home and relationship well-being.  Sexual activity.  Eating habits.  History of falls.  Memory and ability to understand (cognition).  Work and work Statistician.  Reproductive health. Screening  You may have the following tests or measurements:  Height, weight, and BMI.  Blood pressure.  Lipid and cholesterol levels. These may be checked every 5 years, or more frequently if you are over 29 years old.  Skin check.  Lung cancer screening. You may have this screening every year starting at age 53 if you have a 30-pack-year history of smoking and currently smoke or have quit within the past 15 years.  Fecal occult blood test (FOBT) of the stool. You may have this test every year starting at age 68.  Flexible sigmoidoscopy or colonoscopy. You may have a sigmoidoscopy every 5 years or a colonoscopy every 10 years starting at age 17.  Hepatitis C blood test.  Hepatitis B blood test.  Sexually transmitted disease (STD) testing.  Diabetes screening. This is done by checking your blood sugar (glucose) after you have not eaten for a while (fasting). You may have this done every 1-3 years.  Bone density scan. This is done to screen for osteoporosis. You may have this done starting at age 57.  Mammogram. This may be done every 1-2 years. Talk to your health care provider about how often you should have regular mammograms. Talk with your health care provider about your test results, treatment options, and if necessary, the need for more tests. Vaccines  Your health care provider may recommend certain vaccines, such as:  Influenza vaccine. This is recommended every year.  Tetanus, diphtheria, and acellular pertussis (  Tdap, Td) vaccine. You may need a Td booster every 10 years.  Zoster vaccine. You may need this after age 33.  Pneumococcal 13-valent conjugate (PCV13) vaccine. One dose is  recommended after age 31.  Pneumococcal polysaccharide (PPSV23) vaccine. One dose is recommended after age 63. Talk to your health care provider about which screenings and vaccines you need and how often you need them. This information is not intended to replace advice given to you by your health care provider. Make sure you discuss any questions you have with your health care provider. Document Released: 03/26/2015 Document Revised: 11/17/2015 Document Reviewed: 12/29/2014 Elsevier Interactive Patient Education  2017 [place] Prevention in the Home Falls can cause injuries. They can happen to people of all ages. There are many things you can do to make your home safe and to help prevent falls. What can I do on the outside of my home?  Regularly fix the edges of walkways and driveways and fix any cracks.  Remove anything that might make you trip as you walk through a door, such as a raised step or threshold.  Trim any bushes or trees on the path to your home.  Use bright outdoor lighting.  Clear any walking paths of anything that might make someone trip, such as rocks or tools.  Regularly check to see if handrails are loose or broken. Make sure that both sides of any steps have handrails.  Any raised decks and porches should have guardrails on the edges.  Have any leaves, snow, or ice cleared regularly.  Use sand or salt on walking paths during winter.  Clean up any spills in your garage right away. This includes oil or grease spills. What can I do in the bathroom?  Use night lights.  Install grab bars by the toilet and in the tub and shower. Do not use towel bars as grab bars.  Use non-skid mats or decals in the tub or shower.  If you need to sit down in the shower, use a plastic, non-slip stool.  Keep the floor dry. Clean up any water that spills on the floor as soon as it happens.  Remove soap buildup in the tub or shower regularly.  Attach bath mats  securely with double-sided non-slip rug tape.  Do not have throw rugs and other things on the floor that can make you trip. What can I do in the bedroom?  Use night lights.  Make sure that you have a light by your bed that is easy to reach.  Do not use any sheets or blankets that are too big for your bed. They should not hang down onto the floor.  Have a firm chair that has side arms. You can use this for support while you get dressed.  Do not have throw rugs and other things on the floor that can make you trip. What can I do in the kitchen?  Clean up any spills right away.  Avoid walking on wet floors.  Keep items that you use a lot in easy-to-reach places.  If you need to reach something above you, use a strong step stool that has a grab bar.  Keep electrical cords out of the way.  Do not use floor polish or wax that makes floors slippery. If you must use wax, use non-skid floor wax.  Do not have throw rugs and other things on the floor that can make you trip. What can I do with my stairs?  Do  not leave any items on the stairs.  Make sure that there are handrails on both sides of the stairs and use them. Fix handrails that are broken or loose. Make sure that handrails are as long as the stairways.  Check any carpeting to make sure that it is firmly attached to the stairs. Fix any carpet that is loose or worn.  Avoid having throw rugs at the top or bottom of the stairs. If you do have throw rugs, attach them to the floor with carpet tape.  Make sure that you have a light switch at the top of the stairs and the bottom of the stairs. If you do not have them, ask someone to add them for you. What else can I do to help prevent falls?  Wear shoes that:  Do not have high heels.  Have rubber bottoms.  Are comfortable and fit you well.  Are closed at the toe. Do not wear sandals.  If you use a stepladder:  Make sure that it is fully opened. Do not climb a closed  stepladder.  Make sure that both sides of the stepladder are locked into place.  Ask someone to hold it for you, if possible.  Clearly mark and make sure that you can see:  Any grab bars or handrails.  First and last steps.  Where the edge of each step is.  Use tools that help you move around (mobility aids) if they are needed. These include:  Canes.  Walkers.  Scooters.  Crutches.  Turn on the lights when you go into a dark area. Replace any light bulbs as soon as they burn out.  Set up your furniture so you have a clear path. Avoid moving your furniture around.  If any of your floors are uneven, fix them.  If there are any pets around you, be aware of where they are.  Review your medicines with your doctor. Some medicines can make you feel dizzy. This can increase your chance of falling. Ask your doctor what other things that you can do to help prevent falls. This information is not intended to replace advice given to you by your health care provider. Make sure you discuss any questions you have with your health care provider. Document Released: 12/24/2008 Document Revised: 08/05/2015 Document Reviewed: 04/03/2014 Elsevier Interactive Patient Education  2017 [employer].

## 2020-08-16 ENCOUNTER — Ambulatory Visit
Admission: RE | Admit: 2020-08-16 | Discharge: 2020-08-16 | Disposition: A | Discharge status: Home / Self Care | Payer: Medicare Other | Source: Ambulatory Visit | Attending: Nurse Practitioner | Admitting: Nurse Practitioner

## 2020-08-16 ENCOUNTER — Other Ambulatory Visit: Payer: Self-pay

## 2020-08-16 DIAGNOSIS — Z1231 Encounter for screening mammogram for malignant neoplasm of breast: Secondary | ICD-10-CM

## 2020-09-26 ENCOUNTER — Other Ambulatory Visit: Payer: Self-pay | Admitting: Nurse Practitioner

## 2020-09-26 DIAGNOSIS — E785 Hyperlipidemia, unspecified: Secondary | ICD-10-CM

## 2020-09-26 DIAGNOSIS — I10 Essential (primary) hypertension: Secondary | ICD-10-CM

## 2020-09-27 NOTE — Telephone Encounter (Signed)
Chart supports Rx Last seen 05/2020 Next appt 11/2020

## 2020-11-01 ENCOUNTER — Other Ambulatory Visit (INDEPENDENT_AMBULATORY_CARE_PROVIDER_SITE_OTHER): Payer: Medicare Other

## 2020-11-01 ENCOUNTER — Other Ambulatory Visit: Payer: Self-pay

## 2020-11-01 DIAGNOSIS — E782 Mixed hyperlipidemia: Secondary | ICD-10-CM | POA: Diagnosis not present

## 2020-11-01 DIAGNOSIS — E1142 Type 2 diabetes mellitus with diabetic polyneuropathy: Secondary | ICD-10-CM | POA: Diagnosis not present

## 2020-11-01 LAB — LIPID PANEL
Cholesterol: 128 mg/dL (ref 0–200)
HDL: 45 mg/dL (ref >39.00)
LDL Cholesterol: 53 mg/dL (ref 0–99)
NonHDL: 83.36
Total CHOL/HDL Ratio: 3
Triglycerides: 153 mg/dL — ABNORMAL HIGH (ref 0.0–149.0)
VLDL: 30.6 mg/dL (ref 0.0–40.0)

## 2020-11-01 LAB — COMPREHENSIVE METABOLIC PANEL
ALT: 30 U/L (ref 0–35)
AST: 29 U/L (ref 0–37)
Albumin: 4.4 g/dL (ref 3.5–5.2)
Alkaline Phosphatase: 45 U/L (ref 39–117)
BUN: 24 mg/dL — ABNORMAL HIGH (ref 6–23)
CO2: 27 mEq/L (ref 19–32)
Calcium: 10 mg/dL (ref 8.4–10.5)
Chloride: 98 mEq/L (ref 96–112)
Creatinine, Ser: 0.61 mg/dL (ref 0.40–1.20)
GFR: 87.55 mL/min (ref >60.00)
Glucose, Bld: 96 mg/dL (ref 70–99)
Potassium: 4.1 mEq/L (ref 3.5–5.1)
Sodium: 136 mEq/L (ref 135–145)
Total Bilirubin: 0.5 mg/dL (ref 0.2–1.2)
Total Protein: 7.1 g/dL (ref 6.0–8.3)

## 2020-11-01 LAB — HEMOGLOBIN A1C: Hgb A1c MFr Bld: 6.5 % (ref 4.6–6.5)

## 2020-11-04 ENCOUNTER — Ambulatory Visit (INDEPENDENT_AMBULATORY_CARE_PROVIDER_SITE_OTHER): Payer: Medicare Other | Admitting: Endocrinology

## 2020-11-04 ENCOUNTER — Other Ambulatory Visit: Payer: Self-pay

## 2020-11-04 ENCOUNTER — Encounter: Payer: Self-pay | Admitting: Endocrinology

## 2020-11-04 VITALS — BP 126/78 | HR 73 | Ht 68.5 in | Wt 163.0 lb

## 2020-11-04 DIAGNOSIS — E1165 Type 2 diabetes mellitus with hyperglycemia: Secondary | ICD-10-CM | POA: Diagnosis not present

## 2020-11-04 DIAGNOSIS — I10 Essential (primary) hypertension: Secondary | ICD-10-CM

## 2020-11-04 DIAGNOSIS — E785 Hyperlipidemia, unspecified: Secondary | ICD-10-CM | POA: Diagnosis not present

## 2020-11-04 NOTE — Progress Notes (Signed)
Patient ID: Sandra Ray, female   DOB: 02/11/1946, 75 y.o.   MRN: [id number]            Reason for Appointment: Follow-up    History of Present Illness:          Date of diagnosis of type 2 diabetes mellitus: 1996       Background history:   She had a high blood sugar on lab work which diagnosed her diabetes and she was most likely treated with metformin for a few years She has also taking glipizide for several years She was tried on Actos which was started because of significant weight gain. Over the last few years she has been taking either  Januvia or Tradjenta with her metformin in combination with fair control Her A1c has been below 8% only twice in the last 2-3 years  Recent history:    Non-insulin hypoglycemic drugs: Amaryl 1 mg in the morning, Trulicity  3 mg weekly,  Xigduo 07/998, 1 tablet twice daily  Current management, blood sugar patterns and problems identified:  Her A1c is about the same at 6.5  She did bring her monitor for download She was also told on her last visit to reduce Amaryl to half tablet which she is taking in the morning before breakfast  She said that she will periodically get diarrhea and nausea  Occasionally the diarrhea may be the day after taking Trulicity  However she has a previous history of nausea from metformin She is a little more active with less hip pain Has lost some weight Highest blood sugar recently is only 169 after dinner and overall is having controlled stable readings Only very rarely will have a feeling of mild hypoglycemia        Side effects from medications have been: Weight gain from Actos, nausea from high dose metformin   Glucose monitoring:  done <1 times a day         Glucometer: One Touch Verio     Blood Glucose readings from meter download and averages as follows for 30 days   PRE-MEAL Fasting Lunch Dinner Bedtime Overall  Glucose range:     97-169  Mean/median: 107  115     POST-MEAL PC Breakfast PC  Lunch PC Dinner  Glucose range:   125, 169  Mean/median: 140 140    Previously:   PRE-MEAL Fasting Lunch Dinner Bedtime Overall  Glucose range:  100-124  155, 166  78, 157  78-166  Mean/median:  111    121   POST-MEAL PC Breakfast PC Lunch PC Dinner  Glucose range:   89-126  132, 148  Mean/median:                     Weight history: Previous range 200-254  Wt Readings from Last 3 Encounters:  11/04/20 163 lb (73.9 kg)  08/03/20 168 lb (76.2 kg)  07/05/20 168 lb (76.2 kg)    Glycemic control:   Lab Results  Component Value Date   HGBA1C 6.5 11/01/2020   HGBA1C 6.4 07/01/2020   HGBA1C 6.4 02/02/2020   Lab Results  Component Value Date   MICROALBUR 2.2 (H) 07/01/2020   LDLCALC 53 11/01/2020   CREATININE 0.61 11/01/2020   Lab Results  Component Value Date   MICRALBCREAT 2.8 07/01/2020    Lab Results  Component Value Date   FRUCTOSAMINE 237 03/28/2019   FRUCTOSAMINE 263 05/14/2018   FRUCTOSAMINE 328 (H) 07/07/2016      Allergies  as of 11/04/2020       Reactions   Penicillins    Causes yeast infections Did it involve swelling of the face/tongue/throat, SOB, or low BP? No Did it involve sudden or severe rash/hives, skin peeling, or any reaction on the inside of your mouth or nose? No Did you need to seek medical attention at a hospital or doctor's office? No When did it last happen?  2018     If all above answers are "NO", may proceed with cephalosporin use.   Amoxicillin Other (See Comments)   Pt states it causes her a yeast infection  Causes yeast infections Did it involve swelling of the face/tongue/throat, SOB, or low BP? No Did it involve sudden or severe rash/hives, skin peeling, or any reaction on the inside of your mouth or nose? No Did you need to seek medical attention at a hospital or doctor's office? No When did it last happen?  2018     If all above answers are "NO", may proceed with cephalosporin use.   Erythromycin    Passed out while  taking        Medication List        Accurate as of November 04, 2020  2:14 PM. If you have any questions, ask your nurse or doctor.          acetaminophen 650 MG CR tablet Commonly known as: TYLENOL Take 650 mg by mouth every 8 (eight) hours as needed for pain.   atorvastatin 40 MG tablet Commonly known as: LIPITOR Take 1 tablet (40 mg total) by mouth daily at 6 PM.   Coenzyme Q10 400 MG Caps Take 400 mg by mouth daily.   diclofenac Sodium 1 % Gel Commonly known as: VOLTAREN Apply topically 4 (four) times daily.   fluticasone 50 MCG/ACT nasal spray Commonly known as: FLONASE Place 2 sprays into both nostrils daily. What changed:  when to take this reasons to take this   gabapentin 300 MG capsule Commonly known as: NEURONTIN Take 1 capsule (300 mg total) by mouth 3 (three) times daily.   Garlic 123XX123 MG Caps Take 1,000 mg by mouth daily.   glimepiride 2 MG tablet Commonly known as: AMARYL TAKE 1 TABLET BY MOUTH  DAILY BEFORE BREAKFAST   hydrochlorothiazide 25 MG tablet Commonly known as: HYDRODIURIL Take 1 tablet (25 mg total) by mouth daily.   lisinopril 5 MG tablet Commonly known as: ZESTRIL TAKE 1 TABLET BY MOUTH  DAILY   omega-3 acid ethyl esters 1 g capsule Commonly known as: LOVAZA Take 1 capsule (1 g total) by mouth 2 (two) times daily.   ondansetron 4 MG tablet Commonly known as: Zofran Take 1 tablet (4 mg total) by mouth as needed for nausea or vomiting.   onetouch ultrasoft lancets Use as instructed once day.  E11.40   OneTouch Verio test strip Generic drug: glucose blood USE AS INSTRUCTED TO CHECK  BLOOD SUGAR TWICE DAILY   potassium chloride SA 20 MEQ tablet Commonly known as: KLOR-CON Take 1 tablet (20 mEq total) by mouth daily.   Trulicity 3 0000000 Sopn Generic drug: Dulaglutide Inject 3 mg into the skin once a week. Inject '3mg'$  under the skin once weekly.   Xigduo XR 07-998 MG Tb24 Generic drug: Dapagliflozin-metFORMIN HCl  ER Take 1 tablet by mouth 2 (two) times daily. Take 1 tablet by mouth twice daily.        Allergies:  Allergies  Allergen Reactions   Penicillins  Causes yeast infections Did it involve swelling of the face/tongue/throat, SOB, or low BP? No Did it involve sudden or severe rash/hives, skin peeling, or any reaction on the inside of your mouth or nose? No Did you need to seek medical attention at a hospital or doctor's office? No When did it last happen?  2018     If all above answers are "NO", may proceed with cephalosporin use.    Amoxicillin Other (See Comments)    Pt states it causes her a yeast infection  Causes yeast infections Did it involve swelling of the face/tongue/throat, SOB, or low BP? No Did it involve sudden or severe rash/hives, skin peeling, or any reaction on the inside of your mouth or nose? No Did you need to seek medical attention at a hospital or doctor's office? No When did it last happen?  2018     If all above answers are "NO", may proceed with cephalosporin use.   Erythromycin     Passed out while taking    Past Medical History:  Diagnosis Date   Arthritis    Breast abscess 2000   right breast   Carpal tunnel syndrome    Complication of anesthesia    Degenerative disc disease    Diabetes mellitus type II    Eczema of hand    Hx of colonic polyps 12/02/2014   2004 - 2 diminutive polyps    Hyperlipidemia    Hypertension    Metatarsal deformity    Ovarian cyst 07/2017   left    PONV (postoperative nausea and vomiting)    Shingles 2000   Tubular adenoma of colon     Past Surgical History:  Procedure Laterality Date   ABDOMINAL HYSTERECTOMY  1981   menorrhagia   ABSCESS DRAINAGE Right    breast   APPENDECTOMY  1966   done with CSxn. Prophylactic   BREAST BIOPSY Left    carpel tunnel Right    CESAREAN SECTION     x2   COLONOSCOPY     LUMBAR LAMINECTOMY     [id number]. 2011 L4,L5,S1   Right rotator cuff repair 09/2015     ROBOTIC  ASSISTED SALPINGO OOPHERECTOMY Left 04/16/2018   Procedure: XI ROBOTIC ASSISTED  LEFT SALPINGO OOPHORECTOMY;  Surgeon: [name], MD;  Location: [facility];  Service: Gynecology;  Laterality: Left;   TUBAL LIGATION      Family History  Problem Relation Age of Onset   Hypertension Father    Diabetes Mother    Stroke Mother    Hypertension Mother    High Cholesterol Mother    Heart disease Mother    Diabetes Brother    Breast cancer Daughter 29       unsure if did genetic testing   Breast cancer Maternal Aunt    Breast cancer Sister 70   Colon polyps Brother     Social History:  reports that she has never smoked. She has never used smokeless tobacco. She reports that she does not drink alcohol and does not use drugs.   Review of Systems   Lipid history: Has had high triglycerides and LDL, LDL below 100 on atorvastatin 40 mg prescribed by her PCP  Hypertriglyceridemia treated with Lovaza, triglycerides as follows   Lab Results  Component Value Date   CHOL 128 11/01/2020   CHOL 117 02/02/2020   CHOL 120 06/27/2019   Lab Results  Component Value Date   HDL 45.00 11/01/2020   HDL 48.00 02/02/2020  HDL 43.90 06/27/2019   Lab Results  Component Value Date   LDLCALC 53 11/01/2020   LDLCALC 45 02/02/2020   LDLCALC 48 06/27/2019   Lab Results  Component Value Date   TRIG 153.0 (H) 11/01/2020   TRIG 120.0 02/02/2020   TRIG 144.0 06/27/2019   Lab Results  Component Value Date   CHOLHDL 3 11/01/2020   CHOLHDL 2 02/02/2020   CHOLHDL 3 06/27/2019   Lab Results  Component Value Date   LDLDIRECT 69.0 08/19/2018   LDLDIRECT 80.0 02/06/2018   LDLDIRECT 91.0 08/22/2016            Hypertension: controlled with 5 mg Lisinopril and 25 mg HCTZ Also on Farxiga   BP Readings from Last 3 Encounters:  11/04/20 126/78  07/05/20 132/78  05/28/20 114/60   Lab Results  Component Value Date   K 4.1 11/01/2020    Most recent eye exam was 11/21  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, taking gabapentin 300 mg  Also has tendency to low muscle mass in the legs   LABS:  Lab on 11/01/2020  Component Date Value Ref Range Status   Cholesterol 11/01/2020 128  0 - 200 mg/dL Final   ATP III Classification       Desirable:  < 200 mg/dL               Borderline High:  200 - 239 mg/dL          High:  > = 240 mg/dL   Triglycerides 11/01/2020 153.0 (A) 0.0 - 149.0 mg/dL Final   Normal:  <150 mg/dLBorderline High:  150 - 199 mg/dL   HDL 11/01/2020 45.00  >39.00 mg/dL Final   VLDL 11/01/2020 30.6  0.0 - 40.0 mg/dL Final   LDL Cholesterol 11/01/2020 53  0 - 99 mg/dL Final   Total CHOL/HDL Ratio 11/01/2020 3   Final                  Men          Women1/2 Average Risk     3.4          3.3Average Risk          5.0          4.42X Average Risk          9.6          7.13X Average Risk          15.0          11.0                       NonHDL 11/01/2020 83.36   Final   NOTE:  Non-HDL goal should be 30 mg/dL higher than patient's LDL goal (i.e. LDL goal of < 70 mg/dL, would have non-HDL goal of < 100 mg/dL)   Sodium 11/01/2020 136  135 - 145 mEq/L Final   Potassium 11/01/2020 4.1  3.5 - 5.1 mEq/L Final   Chloride 11/01/2020 98  96 - 112 mEq/L Final   CO2 11/01/2020 27  19 - 32 mEq/L Final   Glucose, Bld 11/01/2020 96  70 - 99 mg/dL Final   BUN 11/01/2020 24 (A) 6 - 23 mg/dL Final   Creatinine, Ser 11/01/2020 0.61  0.40 - 1.20 mg/dL Final   Total Bilirubin 11/01/2020 0.5  0.2 - 1.2 mg/dL Final   Alkaline Phosphatase 11/01/2020 45  39 - 117  U/L Final   AST 11/01/2020 29  0 - 37 U/L Final   ALT 11/01/2020 30  0 - 35 U/L Final   Total Protein 11/01/2020 7.1  6.0 - 8.3 g/dL Final   Albumin 11/01/2020 4.4  3.5 - 5.2 g/dL Final   GFR 11/01/2020 87.55  >60.00 mL/min Final   Calculated using the CKD-EPI Creatinine Equation (2021)   Calcium 11/01/2020 10.0  8.4 - 10.5 mg/dL Final   Hgb A1c MFr Bld 11/01/2020 6.5  4.6 - 6.5 % Final    Glycemic Control Guidelines for People with Diabetes:Non Diabetic:  <6%Goal of Therapy: <7%Additional Action Suggested:  >8%     Physical Examination:  BP 126/78   Pulse 73   Ht 5' 8.5" (1.74 m)   Wt 163 lb (73.9 kg)   SpO2 96%   BMI 24.42 kg/m     ASSESSMENT:  Diabetes type 2, non-insulin-dependent  See history of present illness for detailed discussion of current diabetes management, blood sugar patterns and problems identified   Her A1c is excellent at 6.5  She is on a 4 drug regimen including Trulicity 3 mg, Xigduo and 1 mg  Amaryl  Blood sugars are well controlled at home as seen on her monitor although she is checking less than once a day  No hypoglycemia with reducing Amaryl  She is able to get her brand-name medications through patient assistance programs now   She does have some tendency to nausea and diarrhea periodically although not consistent and generally tolerable except for diarrhea  She does have a fairly good diet and has lost 4 pounds with somewhat more activity also   HYPERTENSION: Blood pressure is well controlled  LIPIDS: Excellent control although triglycerides are slightly higher LDL controlled with atorvastatin   PLAN:   She will leave off her Xigduo in the evening for a week or so and see if it makes a difference in her nausea and diarrhea and let us know  May consider switching the prescription to 12/998 if this helps  Also may consider reducing the dose of Trulicity if she continues to have diarrhea the day after the injection  She will try to do more readings after meals   No change in lipid medications and will continue to follow triglycerides which are generally better than previously   Follow-up in 4 months  Patient Instructions  Check blood sugars on waking up 3 days a week  Also check blood sugars about 2 hours after meals and do this after different meals by rotation  Recommended blood sugar levels on waking up are 90-130  and about 2 hours after meal is 130-160  Please bring your blood sugar monitor to each visit, thank you   Xigduo 1 pill daily at [facility] for 1 week and let us know       Elayne Snare 11/04/2020, 2:14 PM   Note: This office note was prepared with Dragon voice recognition system technology. Any transcriptional errors that result from this process are unintentional.

## 2020-11-04 NOTE — Patient Instructions (Addendum)
Check blood sugars on waking up 3 days a week  Also check blood sugars about 2 hours after meals and do this after different meals by rotation  Recommended blood sugar levels on waking up are 90-130 and about 2 hours after meal is 130-160  Please bring your blood sugar monitor to each visit, thank you   Xigduo 1 pill daily at [facility] for 1 week and let us know

## 2020-11-17 ENCOUNTER — Other Ambulatory Visit: Payer: Self-pay | Admitting: Endocrinology

## 2020-11-29 ENCOUNTER — Encounter: Payer: Self-pay | Admitting: Nurse Practitioner

## 2020-11-29 ENCOUNTER — Other Ambulatory Visit: Payer: Self-pay

## 2020-11-29 ENCOUNTER — Ambulatory Visit (INDEPENDENT_AMBULATORY_CARE_PROVIDER_SITE_OTHER): Payer: Medicare Other | Admitting: Nurse Practitioner

## 2020-11-29 VITALS — BP 128/64 | HR 68 | Temp 97.0°F | Wt 164.6 lb

## 2020-11-29 DIAGNOSIS — Z780 Asymptomatic menopausal state: Secondary | ICD-10-CM

## 2020-11-29 DIAGNOSIS — E785 Hyperlipidemia, unspecified: Secondary | ICD-10-CM

## 2020-11-29 DIAGNOSIS — N3946 Mixed incontinence: Secondary | ICD-10-CM

## 2020-11-29 DIAGNOSIS — Z23 Encounter for immunization: Secondary | ICD-10-CM

## 2020-11-29 DIAGNOSIS — Z7189 Other specified counseling: Secondary | ICD-10-CM | POA: Diagnosis not present

## 2020-11-29 DIAGNOSIS — I10 Essential (primary) hypertension: Secondary | ICD-10-CM | POA: Diagnosis not present

## 2020-11-29 DIAGNOSIS — E559 Vitamin D deficiency, unspecified: Secondary | ICD-10-CM | POA: Diagnosis not present

## 2020-11-29 NOTE — Assessment & Plan Note (Signed)
associated with stress and urge Urinary incontinence. Worse in last 33month. No dysuria, no pelvic pain, no back pain.  Had diarrhea with metformin dosage, but that has improved with decreased dose. 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 Declined referral to urogynecology and outpatient Pelvic training. Provided printed kegel exercise sheet

## 2020-11-29 NOTE — Assessment & Plan Note (Signed)
Orange form provided

## 2020-11-29 NOTE — Patient Instructions (Addendum)
Start B-complex supplement due to prolonged use of metformin Maintain Calcium '600mg'$  BID and Vitamin D 1000IU daily for bone health. Start strengthening exercise (use resistance band and/or chair exercise) to help with gait and leg strength. Start kegel exercise to help with urinary incontinence. Let me know if you change your mind about PT referral. Have vitamin D and CK drawn by Dr. Ronnie Derby lab  Hip Exercises Ask your health care provider which exercises are safe for you. Do exercises exactly as told by your health care provider and adjust them as directed. It is normal to feel mild stretching, pulling, tightness, or discomfort as you do these exercises. Stop right away if you feel sudden pain or your pain gets worse. Do not begin these exercises until told by your health care provider. Stretching and range-of-motion exercises These exercises warm up your muscles and joints and improve the movement and flexibility of your hip. These exercises also help to relieve pain, numbness, and tingling. You may be asked to limit your range of motion if you had a hip replacement. Talk to your health care provider about these restrictions. Hamstrings, supine  Lie on your back (supine position). Loop a belt or towel over the ball of your left / right foot. The ball of your foot is on the walking surface, right under your toes. Straighten your left / right knee and slowly pull on the belt or towel to raise your leg until you feel a gentle stretch behind your knee (hamstring). Do not let your knee bend while you do this. Keep your other leg flat on the floor. Hold this position for __________ seconds. Slowly return your leg to the starting position. Repeat __________ times. Complete this exercise __________ times a day. Hip rotation  Lie on your back on a firm surface. With your left / right hand, gently pull your left / right knee toward the shoulder that is on the same side of the body. Stop when your  knee is pointing toward the ceiling. Hold your left / right ankle with your other hand. Keeping your knee steady, gently pull your left / right ankle toward your other shoulder until you feel a stretch in your buttocks. Keep your hips and shoulders firmly planted while you do this stretch. Hold this position for __________ seconds. Repeat __________ times. Complete this exercise __________ times a day. Seated stretch This exercise is sometimes called hamstrings and adductors stretch. Sit on the floor with your legs stretched wide. Keep your knees straight during this exercise. Keeping your head and back in a straight line, bend at your waist to reach for your left foot (position A). You should feel a stretch in your right inner thigh (adductors). Hold this position for __________ seconds. Then slowly return to the upright position. Keeping your head and back in a straight line, bend at your waist to reach forward (position B). You should feel a stretch behind both of your thighs and knees (hamstrings). Hold this position for __________ seconds. Then slowly return to the upright position. Keeping your head and back in a straight line, bend at your waist to reach for your right foot (position C). You should feel a stretch in your left inner thigh (adductors). Hold this position for __________ seconds. Then slowly return to the upright position. Repeat __________ times. Complete this exercise __________ times a day. Lunge This exercise stretches the muscles of the hip (hip flexors). Place your left / right knee on the floor and bend your  other knee so that is directly over your ankle. You should be half-kneeling. Keep good posture with your head over your shoulders. Tighten your buttocks to point your tailbone downward. This will prevent your back from arching too much. You should feel a gentle stretch in the front of your left / right thigh and hip. If you do not feel a stretch, slide your other  foot forward slightly and then slowly lunge forward with your chest up until your knee once again lines up over your ankle. Make sure your tailbone continues to point downward. Hold this position for __________ seconds. Slowly return to the starting position. Repeat __________ times. Complete this exercise __________ times a day. Strengthening exercises These exercises build strength and endurance in your hip. Endurance is the ability to use your muscles for a long time, even after they get tired. Bridge This exercise strengthens the muscles of your hip (hip extensors). Lie on your back on a firm surface with your knees bent and your feet flat on the floor. Tighten your buttocks muscles and lift your bottom off the floor until the trunk of your body and your hips are level with your thighs. Do not arch your back. You should feel the muscles working in your buttocks and the back of your thighs. If you do not feel these muscles, slide your feet 1-2 inches (2.5-5 cm) farther away from your buttocks. Hold this position for __________ seconds. Slowly lower your hips to the starting position. Let your muscles relax completely between repetitions. Repeat __________ times. Complete this exercise __________ times a day. Straight leg raises, side-lying This exercise strengthens the muscles that move the hip joint away from the center of the body (hip abductors). Lie on your side with your left / right leg in the top position. Lie so your head, shoulder, hip, and knee line up. You may bend your bottom knee slightly to help you balance. Roll your hips slightly forward, so your hips are stacked directly over each other and your left / right knee is facing forward. Leading with your heel, lift your top leg 4-6 inches (10-15 cm). You should feel the muscles in your top hip lifting. Do not let your foot drift forward. Do not let your knee roll toward the ceiling. Hold this position for __________  seconds. Slowly return to the starting position. Let your muscles relax completely between repetitions. Repeat __________ times. Complete this exercise __________ times a day. Straight leg raises, side-lying This exercise strengthens the muscles that move the hip joint toward the center of the body (hip adductors). Lie on your side with your left / right leg in the bottom position. Lie so your head, shoulder, hip, and knee line up. You may place your upper foot in front to help you balance. Roll your hips slightly forward, so your hips are stacked directly over each other and your left / right knee is facing forward. Tense the muscles in your inner thigh and lift your bottom leg 4-6 inches (10-15 cm). Hold this position for __________ seconds. Slowly return to the starting position. Let your muscles relax completely between repetitions. Repeat __________ times. Complete this exercise __________ times a day. Straight leg raises, supine This exercise strengthens the muscles in the front of your thigh (quadriceps). Lie on your back (supine position) with your left / right leg extended and your other knee bent. Tense the muscles in the front of your left / right thigh. You should see your kneecap slide up  or see increased dimpling just above your knee. Keep these muscles tight as you raise your leg 4-6 inches (10-15 cm) off the floor. Do not let your knee bend. Hold this position for __________ seconds. Keep these muscles tense as you lower your leg. Relax the muscles slowly and completely between repetitions. Repeat __________ times. Complete this exercise __________ times a day. Hip abductors, standing This exercise strengthens the muscles that move the leg and hip joint away from the center of the body (hip abductors). Tie one end of a rubber exercise band or tubing to a secure surface, such as a chair, table, or pole. Loop the other end of the band or tubing around your left / right  ankle. Keeping your ankle with the band or tubing directly opposite the secured end, step away until there is tension in the tubing or band. Hold on to a chair, table, or pole as needed for balance. Lift your left / right leg out to your side. While you do this: Keep your back upright. Keep your shoulders over your hips. Keep your toes pointing forward. Make sure to use your hip muscles to slowly lift your leg. Do not tip your body or forcefully lift your leg. Hold this position for __________ seconds. Slowly return to the starting position. Repeat __________ times. Complete this exercise __________ times a day. Squats This exercise strengthens the muscles in the front of your thigh (quadriceps). Stand in a door frame so your feet and knees are in line with the frame. You may place your hands on the frame for balance. Slowly bend your knees and lower your hips like you are going to sit in a chair. Keep your lower legs in a straight-up-and-down position. Do not let your hips go lower than your knees. Do not bend your knees lower than told by your health care provider. If your hip pain increases, do not bend as low. Hold this position for ___________ seconds. Slowly push with your legs to return to standing. Do not use your hands to pull yourself to standing. Repeat __________ times. Complete this exercise __________ times a day. This information is not intended to replace advice given to you by your health care provider. Make sure you discuss any questions you have with your health care provider. Document Revised: 10/03/2018 Document Reviewed: 01/08/2018 Elsevier Patient Education  2022 [employer].  Kegel Exercises Kegel exercises can help strengthen your pelvic floor muscles. The pelvic floor is a group of muscles that support your rectum, small intestine, and bladder. In females, pelvic floor muscles also help support the womb (uterus). These muscles help you control the flow of urine  and stool. Kegel exercises are painless and simple, and they do not require any equipment. Your provider may suggest Kegel exercises to: Improve bladder and bowel control. Improve sexual response. Improve weak pelvic floor muscles after surgery to remove the uterus (hysterectomy) or pregnancy (females). Improve weak pelvic floor muscles after prostate gland removal or surgery (males). Kegel exercises involve squeezing your pelvic floor muscles, which are the same muscles you squeeze when you try to stop the flow of urine or keep from passing gas. The exercises can be done while sitting, standing, or lying down, but it is best to vary your position. Exercises How to do Kegel exercises: Squeeze your pelvic floor muscles tight. You should feel a tight lift in your rectal area. If you are a female, you should also feel a tightness in your vaginal area. Keep your  stomach, buttocks, and legs relaxed. Hold the muscles tight for up to 10 seconds. Breathe normally. Relax your muscles. Repeat as told by your health care provider. Repeat this exercise daily as told by your health care provider. Continue to do this exercise for at least 4-6 weeks, or for as long as told by your health care provider. You may be referred to a physical therapist who can help you learn more about how to do Kegel exercises. Depending on your condition, your health care provider may recommend: Varying how long you squeeze your muscles. Doing several sets of exercises every day. Doing exercises for several weeks. Making Kegel exercises a part of your regular exercise routine. This information is not intended to replace advice given to you by your health care provider. Make sure you discuss any questions you have with your health care provider. Document Revised: 02/18/2020 Document Reviewed: 10/17/2017 Elsevier Patient Education  [employer].

## 2020-11-29 NOTE — Progress Notes (Signed)
Subjective:  Patient ID: [facility], female    DOB: Jan 08, 1946  Age: 75 y.o. MRN: [id number]  CC: Follow-up (6 month f/u on HTN and cholesterol. Pt is fasting. /Pt states she has noticed for a past couple of months she has noticed a slight bladder issue and would like to discuss. )  Urinary Frequency  This is a chronic problem. The current episode started more than 1 year ago. The problem occurs every urination. The problem has been unchanged. The pain is at a severity of 0/10. The patient is experiencing no pain. There has been no fever. She is Not sexually active. There is No history of pyelonephritis. Associated symptoms include frequency and urgency. Pertinent negatives include no chills, discharge, flank pain, hematuria, hesitancy, nausea, possible pregnancy, sweats or vomiting. She has tried nothing for the symptoms. There is no history of catheterization, kidney stones, recurrent UTIs, a single kidney or urinary stasis.   Essential hypertension BP at goal with HCTZ and lisinopril BP Readings from Last 3 Encounters:  11/29/20 128/64  11/04/20 126/78  07/05/20 132/78   Reviewed CMP completed by endocrinology: renal electrolytes and renal function Maintain current doses  Hyperlipidemia LDL goal <70 LDL at goal with lovaza and atorvastatin Maintain doses Lipid Panel     Component Value Date/Time   CHOL 128 11/01/2020 0827   TRIG 153.0 (H) 11/01/2020 0827   HDL 45.00 11/01/2020 0827   CHOLHDL 3 11/01/2020 0827   VLDL 30.6 11/01/2020 0827   LDLCALC 53 11/01/2020 0827   LDLDIRECT 69.0 08/19/2018 0904    Asymptomatic age-related postmenopausal state Repeat bone density  DNR (do not resuscitate) discussion Orange form provided  Mixed stress and urge urinary incontinence associated with stress and urge Urinary incontinence. Worse in last 90month. No dysuria, no pelvic pain, no back pain.  Had diarrhea with metformin dosage, but that has improved with decreased dose. 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 Declined referral to urogynecology and outpatient Pelvic training. Provided printed kegel exercise sheet Wt Readings from Last 3 Encounters:  11/29/20 164 lb 9.6 oz (74.7 kg)  11/04/20 163 lb (73.9 kg)  08/03/20 168 lb (76.2 kg)    Reviewed past Medical, Social and Family history today.  Outpatient Medications Prior to Visit  Medication Sig Dispense Refill   acetaminophen (TYLENOL) 650 MG CR tablet Take 650 mg by mouth every 8 (eight) hours as needed for pain.     atorvastatin (LIPITOR) 40 MG tablet Take 1 tablet (40 mg total) by mouth daily at 6 PM. 90 tablet 3   Coenzyme Q10 400 MG CAPS Take 400 mg by mouth daily.      Dapagliflozin-metFORMIN HCl ER (XIGDUO XR) 07-998 MG TB24 Take 1 tablet by mouth 2 (two) times daily. Take 1 tablet by mouth twice daily. 180 tablet 4   diclofenac Sodium (VOLTAREN) 1 % GEL Apply topically 4 (four) times daily.     Dulaglutide (TRULICITY) 3 [id number] Inject 3 mg into the skin once a week. Inject '3mg'$  under the skin once weekly. 36 mL 3   fluticasone (FLONASE) 50 MCG/ACT nasal spray Place 2 sprays into both nostrils daily. (Patient taking differently: Place 2 sprays into both nostrils daily as needed for allergies.) 16 g 0   gabapentin (NEURONTIN) 300 MG capsule Take 1 capsule (300 mg total) by mouth 3 (three) times daily. [id number] 3   Garlic 1123XX123MG CAPS Take 1,000 mg by mouth daily.  glimepiride (AMARYL) 2 MG tablet TAKE 1 TABLET BY MOUTH  DAILY BEFORE BREAKFAST 90 tablet 3   glucose blood (ONETOUCH VERIO) test strip USE AS INSTRUCTED TO CHECK  BLOOD SUGAR TWICE DAILY 200 strip 3   hydrochlorothiazide (HYDRODIURIL) 25 MG tablet Take 1 tablet (25 mg total) by mouth daily. 90 tablet 3   Lancets (ONETOUCH ULTRASOFT) lancets Use as instructed once day.  E11.40 100 each 5   lisinopril (ZESTRIL) 5 MG tablet TAKE 1 TABLET BY MOUTH  DAILY 90 tablet 3    omega-3 acid ethyl esters (LOVAZA) 1 g capsule Take 1 capsule (1 g total) by mouth 2 (two) times daily. 60 capsule 5   ondansetron (ZOFRAN) 4 MG tablet Take 1 tablet (4 mg total) by mouth as needed for nausea or vomiting. 30 tablet 1   potassium chloride SA (KLOR-CON) 20 MEQ tablet Take 1 tablet (20 mEq total) by mouth daily. 90 tablet 3   No facility-administered medications prior to visit.    ROS See HPI  Objective:  BP 128/64 (BP Location: Right Arm, Patient Position: Sitting, Cuff Size: Normal)   Pulse 68   Temp (!) 97 F (36.1 C) (Temporal)   Wt 164 lb 9.6 oz (74.7 kg)   SpO2 98%   BMI 24.66 kg/m   Physical Exam Cardiovascular:     Pulses: Normal pulses.     Heart sounds: Normal heart sounds.  Pulmonary:     Effort: Pulmonary effort is normal.     Breath sounds: Normal breath sounds.  Abdominal:     General: Bowel sounds are normal. There is no distension.     Palpations: Abdomen is soft.     Tenderness: There is no abdominal tenderness. There is no guarding.  Musculoskeletal:     Right lower leg: No edema.     Left lower leg: No edema.  Neurological:     Mental Status: She is alert and oriented to person, place, and time.  Psychiatric:        Mood and Affect: Mood normal.        Behavior: Behavior normal.        Thought Content: Thought content normal.    Assessment & Plan:  This visit occurred during the SARS-CoV-2 public health emergency.  Safety protocols were in place, including screening questions prior to the visit, additional usage of staff PPE, and extensive cleaning of exam room while observing appropriate contact time as indicated for disinfecting solutions.   Sandra Ray was seen today for follow-up.  Diagnoses and all orders for this visit:  Essential hypertension  Flu vaccine need -     Flu Vaccine QUAD High Dose(Fluad)  DNR (do not resuscitate) discussion  Asymptomatic age-related postmenopausal state -     DG Bone Density; Future  Vitamin D  insufficiency -     DG Bone Density; Future -     Vitamin D (25 hydroxy); Future  Hyperlipidemia LDL goal <70  Mixed stress and urge urinary incontinence   Problem List Items Addressed This Visit       Cardiovascular and Mediastinum   Essential hypertension - Primary    BP at goal with HCTZ and lisinopril BP Readings from Last 3 Encounters:  11/29/20 128/64  11/04/20 126/78  07/05/20 132/78   Reviewed CMP completed by endocrinology: renal electrolytes and renal function Maintain current doses        Other   Asymptomatic age-related postmenopausal state    Repeat bone density  Relevant Orders   DG Bone Density   DNR (do not resuscitate) discussion    Orange form provided      Hyperlipidemia LDL goal <70    LDL at goal with lovaza and atorvastatin Maintain doses Lipid Panel     Component Value Date/Time   CHOL 128 11/01/2020 0827   TRIG 153.0 (H) 11/01/2020 0827   HDL 45.00 11/01/2020 0827   CHOLHDL 3 11/01/2020 0827   VLDL 30.6 11/01/2020 0827   LDLCALC 53 11/01/2020 0827   LDLDIRECT 69.0 08/19/2018 0904        Mixed stress and urge urinary incontinence    associated with stress and urge Urinary incontinence. Worse in last 72month. No dysuria, no pelvic pain, no back pain.  Had diarrhea with metformin dosage, but that has improved with decreased dose. 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 Declined referral to urogynecology and outpatient Pelvic training. Provided printed kegel exercise sheet      Other Visit Diagnoses     Flu vaccine need       Relevant Orders   Flu Vaccine QUAD High Dose(Fluad) (Completed)   Vitamin D insufficiency       Relevant Orders   DG Bone Density   Vitamin D (25 hydroxy)       Follow-up: Return in about 1 year (around 11/29/2021) for annual wellness exam.  CWilfred Lacy NP

## 2020-11-29 NOTE — Assessment & Plan Note (Addendum)
BP at goal with HCTZ and lisinopril BP Readings from Last 3 Encounters:  11/29/20 128/64  11/04/20 126/78  07/05/20 132/78   Reviewed CMP completed by endocrinology: renal electrolytes and renal function Maintain current doses

## 2020-11-29 NOTE — Assessment & Plan Note (Addendum)
LDL at goal with lovaza and atorvastatin Maintain doses Lipid Panel     Component Value Date/Time   CHOL 128 11/01/2020 0827   TRIG 153.0 (H) 11/01/2020 0827   HDL 45.00 11/01/2020 0827   CHOLHDL 3 11/01/2020 0827   VLDL 30.6 11/01/2020 0827   LDLCALC 53 11/01/2020 0827   LDLDIRECT 69.0 08/19/2018 0904

## 2020-11-29 NOTE — Assessment & Plan Note (Signed)
Repeat bone density

## 2020-12-14 ENCOUNTER — Telehealth: Payer: Self-pay | Admitting: Nurse Practitioner

## 2020-12-14 ENCOUNTER — Other Ambulatory Visit: Payer: Self-pay | Admitting: Endocrinology

## 2020-12-14 ENCOUNTER — Other Ambulatory Visit: Payer: Self-pay | Admitting: Nurse Practitioner

## 2020-12-14 DIAGNOSIS — E785 Hyperlipidemia, unspecified: Secondary | ICD-10-CM

## 2020-12-16 MED ORDER — ATORVASTATIN CALCIUM 40 MG PO TABS
40.0000 mg | ORAL_TABLET | Freq: Every day | ORAL | 3 refills | Status: DC
Start: 2020-12-16 — End: 2021-05-30

## 2020-12-16 NOTE — Telephone Encounter (Signed)
Rx sent to pharmacy patient requested.

## 2020-12-21 ENCOUNTER — Other Ambulatory Visit: Payer: Self-pay | Admitting: Nurse Practitioner

## 2020-12-21 DIAGNOSIS — E1151 Type 2 diabetes mellitus with diabetic peripheral angiopathy without gangrene: Secondary | ICD-10-CM

## 2020-12-23 ENCOUNTER — Telehealth: Payer: Self-pay | Admitting: Nurse Practitioner

## 2020-12-23 DIAGNOSIS — E1151 Type 2 diabetes mellitus with diabetic peripheral angiopathy without gangrene: Secondary | ICD-10-CM

## 2020-12-23 DIAGNOSIS — I10 Essential (primary) hypertension: Secondary | ICD-10-CM

## 2020-12-23 NOTE — Telephone Encounter (Signed)
Pt needs refill on gabapentin (NEURONTIN) 300 MG capsule  And hydrochlorothiazide (HYDRODIURIL) 25 MG tablet

## 2020-12-25 ENCOUNTER — Other Ambulatory Visit: Payer: Self-pay | Admitting: Nurse Practitioner

## 2020-12-25 DIAGNOSIS — I10 Essential (primary) hypertension: Secondary | ICD-10-CM

## 2020-12-25 DIAGNOSIS — E876 Hypokalemia: Secondary | ICD-10-CM

## 2020-12-29 MED ORDER — HYDROCHLOROTHIAZIDE 25 MG PO TABS: 25.0000 mg | ORAL_TABLET | Freq: Every day | ORAL | 3 refills | Status: DC

## 2020-12-29 MED ORDER — GABAPENTIN 300 MG PO CAPS: 300.0000 mg | ORAL_CAPSULE | Freq: Three times a day (TID) | ORAL | 3 refills | Status: DC

## 2020-12-29 NOTE — Telephone Encounter (Signed)
Chart supports Rx Medication sent.  

## 2020-12-29 NOTE — Telephone Encounter (Signed)
Caller Name: Pt Call back phone #: [phone]  MEDICATION(S): Gabapentin & HCTZ Pt does NOT use [facility] and RX sent 05/2020 was never picked up from [facility] Please resend to [facility] order 90 day supply   [facility] ([facility]) - [place], [place] Phone:  [phone]  Fax:  [phone]

## 2020-12-30 MED ORDER — HYDROCHLOROTHIAZIDE 25 MG PO TABS: 25.0000 mg | ORAL_TABLET | Freq: Every day | ORAL | 1 refills | Status: DC

## 2020-12-30 NOTE — Addendum Note (Signed)
Addended by: Leana Gamer on: 12/30/2020 09:34 AM   Modules accepted: Orders

## 2021-01-18 ENCOUNTER — Telehealth: Payer: Self-pay | Admitting: Nurse Practitioner

## 2021-01-18 NOTE — Telephone Encounter (Signed)
Pt called and said that she has an appointment for this Friday for bone density and she said that the [place] needed some clarification before pt is seen and needed to talk to [place]. Please advise

## 2021-01-21 ENCOUNTER — Ambulatory Visit
Admission: RE | Admit: 2021-01-21 | Discharge: 2021-01-21 | Disposition: A | Discharge status: Home / Self Care | Payer: Medicare Other | Source: Ambulatory Visit | Attending: Nurse Practitioner | Admitting: Nurse Practitioner

## 2021-01-21 DIAGNOSIS — Z780 Asymptomatic menopausal state: Secondary | ICD-10-CM | POA: Diagnosis not present

## 2021-02-24 LAB — HM DIABETES EYE EXAM

## 2021-03-04 ENCOUNTER — Encounter: Payer: Self-pay | Admitting: Endocrinology

## 2021-03-15 ENCOUNTER — Other Ambulatory Visit: Payer: Self-pay

## 2021-03-15 ENCOUNTER — Other Ambulatory Visit (INDEPENDENT_AMBULATORY_CARE_PROVIDER_SITE_OTHER): Payer: Medicare Other

## 2021-03-15 DIAGNOSIS — E1165 Type 2 diabetes mellitus with hyperglycemia: Secondary | ICD-10-CM | POA: Diagnosis not present

## 2021-03-15 LAB — COMPREHENSIVE METABOLIC PANEL
ALT: 29 U/L (ref 0–35)
AST: 24 U/L (ref 0–37)
Albumin: 4.2 g/dL (ref 3.5–5.2)
Alkaline Phosphatase: 42 U/L (ref 39–117)
BUN: 25 mg/dL — ABNORMAL HIGH (ref 6–23)
CO2: 30 mEq/L (ref 19–32)
Calcium: 10 mg/dL (ref 8.4–10.5)
Chloride: 97 mEq/L (ref 96–112)
Creatinine, Ser: 0.64 mg/dL (ref 0.40–1.20)
GFR: 86.32 mL/min (ref >60.00)
Glucose, Bld: 113 mg/dL — ABNORMAL HIGH (ref 70–99)
Potassium: 4 mEq/L (ref 3.5–5.1)
Sodium: 137 mEq/L (ref 135–145)
Total Bilirubin: 0.6 mg/dL (ref 0.2–1.2)
Total Protein: 6.8 g/dL (ref 6.0–8.3)

## 2021-03-15 LAB — HEMOGLOBIN A1C: Hgb A1c MFr Bld: 7.2 % — ABNORMAL HIGH (ref 4.6–6.5)

## 2021-03-17 ENCOUNTER — Other Ambulatory Visit: Payer: Self-pay

## 2021-03-17 ENCOUNTER — Ambulatory Visit: Payer: Medicare Other | Admitting: Endocrinology

## 2021-03-17 ENCOUNTER — Encounter: Payer: Self-pay | Admitting: Endocrinology

## 2021-03-17 VITALS — BP 138/64 | HR 65 | Ht 68.5 in | Wt 173.0 lb

## 2021-03-17 DIAGNOSIS — E1165 Type 2 diabetes mellitus with hyperglycemia: Secondary | ICD-10-CM

## 2021-03-17 NOTE — Progress Notes (Signed)
Patient ID: Sandra Ray, female   DOB: 1945/11/29, 76 y.o.   MRN: [id number]            Reason for Appointment: Follow-up    History of Present Illness:          Date of diagnosis of type 2 diabetes mellitus: 1996       Background history:   She had a high blood sugar on lab work which diagnosed her diabetes and she was most likely treated with metformin for a few years She has also taking glipizide for several years She was tried on Actos which was started because of significant weight gain. Over the last few years she has been taking either  Januvia or Tradjenta with her metformin in combination with fair control Her A1c has been below 8% only twice in the last 2-3 years  Recent history:    Non-insulin hypoglycemic drugs: Amaryl 2 mg in the morning, Trulicity  3 mg weekly,  Xigduo 07/998, 1 tablet daily  Current management, blood sugar patterns and problems identified:  Her A1c is relatively higher at 7.2 compared to 6.5   She did improve her symptoms of diarrhea and nausea with reducing her Xigduo to 1 tablet instead of 2 tablets However she did not call to report higher readings  On her own she has gone back to taking the full 2 mg Amaryl in the morning  However she still has sporadic readings as high as 170 In the last 30 days checking her blood sugars somewhat sporadically with only 10 readings in the last month, more in the morning She was also not planning her meals well over the last 4 to 6 weeks and has gained back 9 pounds  Although she was doing some more walking on the last visit she is not doing much because of right leg pain Lab fasting glucose 113        Side effects from medications have been: Weight gain from Actos, nausea from high dose metformin   Glucose monitoring:  done <1 times a day         Glucometer: One Touch Verio     Blood Glucose readings from meter download and averages as follows for 30 days   PRE-MEAL Fasting Lunch Dinner Bedtime  Overall  Glucose range: 109-127    1 -170  Mean/median:     138   POST-MEAL PC Breakfast PC Lunch PC Dinner  Glucose range: 157, 170 139 155, 167  Mean/median:      Previously:  PRE-MEAL Fasting Lunch Dinner Bedtime Overall  Glucose range:     97-169  Mean/median: 107  115     POST-MEAL PC Breakfast PC Lunch PC Dinner  Glucose range:   125, 169  Mean/median: 140 140     Weight history: Previous range 200-254  Wt Readings from Last 3 Encounters:  03/17/21 173 lb (78.5 kg)  11/29/20 164 lb 9.6 oz (74.7 kg)  11/04/20 163 lb (73.9 kg)    Glycemic control:   Lab Results  Component Value Date   HGBA1C 7.2 (H) 03/15/2021   HGBA1C 6.5 11/01/2020   HGBA1C 6.4 07/01/2020   Lab Results  Component Value Date   MICROALBUR 2.2 (H) 07/01/2020   LDLCALC 53 11/01/2020   CREATININE 0.64 03/15/2021   Lab Results  Component Value Date   MICRALBCREAT 2.8 07/01/2020    Lab Results  Component Value Date   FRUCTOSAMINE 237 03/28/2019   FRUCTOSAMINE 263 05/14/2018   FRUCTOSAMINE  328 (H) 07/07/2016      Allergies as of 03/17/2021       Reactions   Penicillins    Causes yeast infections Did it involve swelling of the face/tongue/throat, SOB, or low BP? No Did it involve sudden or severe rash/hives, skin peeling, or any reaction on the inside of your mouth or nose? No Did you need to seek medical attention at a hospital or doctor's office? No When did it last happen?  2018     If all above answers are NO, may proceed with cephalosporin use.   Amoxicillin Other (See Comments)   Pt states it causes her a yeast infection  Causes yeast infections Did it involve swelling of the face/tongue/throat, SOB, or low BP? No Did it involve sudden or severe rash/hives, skin peeling, or any reaction on the inside of your mouth or nose? No Did you need to seek medical attention at a hospital or doctor's office? No When did it last happen?  2018     If all above answers are "NO", may  proceed with cephalosporin use.   Erythromycin    Passed out while taking        Medication List        Accurate as of March 17, 2021  1:40 PM. If you have any questions, ask your nurse or doctor.          acetaminophen 650 MG CR tablet Commonly known as: TYLENOL Take 650 mg by mouth every 8 (eight) hours as needed for pain.   atorvastatin 40 MG tablet Commonly known as: LIPITOR Take 1 tablet (40 mg total) by mouth daily at 6 PM.   Coenzyme Q10 400 MG Caps Take 400 mg by mouth daily.   diclofenac Sodium 1 % Gel Commonly known as: VOLTAREN Apply topically 4 (four) times daily.   fluticasone 50 MCG/ACT nasal spray Commonly known as: FLONASE Place 2 sprays into both nostrils daily. What changed:  when to take this reasons to take this   gabapentin 300 MG capsule Commonly known as: NEURONTIN Take 1 capsule (300 mg total) by mouth 3 (three) times daily.   Garlic 8099 MG Caps Take 1,000 mg by mouth daily.   glimepiride 2 MG tablet Commonly known as: AMARYL TAKE 1 TABLET BY MOUTH  DAILY BEFORE BREAKFAST   hydrochlorothiazide 25 MG tablet Commonly known as: HYDRODIURIL Take 1 tablet (25 mg total) by mouth daily.   lisinopril 5 MG tablet Commonly known as: ZESTRIL TAKE 1 TABLET BY MOUTH  DAILY   omega-3 acid ethyl esters 1 g capsule Commonly known as: LOVAZA Take 1 capsule (1 g total) by mouth 2 (two) times daily.   ondansetron 4 MG tablet Commonly known as: Zofran Take 1 tablet (4 mg total) by mouth as needed for nausea or vomiting.   onetouch ultrasoft lancets Use as instructed once day.  E11.40   OneTouch Verio test strip Generic drug: glucose blood USE AS INSTRUCTED TO CHECK  BLOOD SUGAR TWICE DAILY   potassium chloride SA 20 MEQ tablet Commonly known as: KLOR-CON M TAKE 1 TABLET BY MOUTH  DAILY   Trulicity 3 IP/3.8SN Sopn Generic drug: Dulaglutide Inject 3 mg into the skin once a week. Inject 3mg  under the skin once weekly.   Xigduo XR  07-998 MG Tb24 Generic drug: Dapagliflozin-metFORMIN HCl ER Take 1 tablet by mouth 2 (two) times daily. Take 1 tablet by mouth twice daily.        Allergies:  Allergies  Allergen  Reactions   Penicillins     Causes yeast infections Did it involve swelling of the face/tongue/throat, SOB, or low BP? No Did it involve sudden or severe rash/hives, skin peeling, or any reaction on the inside of your mouth or nose? No Did you need to seek medical attention at a hospital or doctor's office? No When did it last happen?  2018     If all above answers are NO, may proceed with cephalosporin use.    Amoxicillin Other (See Comments)    Pt states it causes her a yeast infection  Causes yeast infections Did it involve swelling of the face/tongue/throat, SOB, or low BP? No Did it involve sudden or severe rash/hives, skin peeling, or any reaction on the inside of your mouth or nose? No Did you need to seek medical attention at a hospital or doctor's office? No When did it last happen?  2018     If all above answers are "NO", may proceed with cephalosporin use.   Erythromycin     Passed out while taking    Past Medical History:  Diagnosis Date   Arthritis    Breast abscess 2000   right breast   Carpal tunnel syndrome    Complication of anesthesia    Degenerative disc disease    Diabetes mellitus type II    Eczema of hand    Hx of colonic polyps 12/02/2014   2004 - 2 diminutive polyps    Hyperlipidemia    Hypertension    Metatarsal deformity    Ovarian cyst 07/2017   left    PONV (postoperative nausea and vomiting)    Shingles 2000   Tubular adenoma of colon     Past Surgical History:  Procedure Laterality Date   ABDOMINAL HYSTERECTOMY  1981   menorrhagia   ABSCESS DRAINAGE Right    breast   APPENDECTOMY  1966   done with CSxn. Prophylactic   BREAST BIOPSY Left    carpel tunnel Right    CESAREAN SECTION     x2   COLONOSCOPY     LUMBAR LAMINECTOMY     1696,7893. 2011  L4,L5,S1   Right rotator cuff repair 09/2015     ROBOTIC ASSISTED SALPINGO OOPHERECTOMY Left 04/16/2018   Procedure: XI ROBOTIC ASSISTED  LEFT SALPINGO OOPHORECTOMY;  Surgeon: [name], MD;  Location: [facility];  Service: Gynecology;  Laterality: Left;   TUBAL LIGATION      Family History  Problem Relation Age of Onset   Hypertension Father    Diabetes Mother    Stroke Mother    Hypertension Mother    High Cholesterol Mother    Heart disease Mother    Diabetes Brother    Breast cancer Daughter 65       unsure if did genetic testing   Breast cancer Maternal Aunt    Breast cancer Sister 36   Colon polyps Brother     Social History:  reports that she has never smoked. She has never used smokeless tobacco. She reports that she does not drink alcohol and does not use drugs.   Review of Systems   Lipid history: Has had high triglycerides and LDL, LDL below 100 on atorvastatin 40 mg prescribed by her PCP  Also has hypertriglyceridemia treated with Lovaza, triglycerides as follows   Lab Results  Component Value Date   CHOL 128 11/01/2020   CHOL 117 02/02/2020   CHOL 120 06/27/2019   Lab Results  Component Value Date  HDL 45.00 11/01/2020   HDL 48.00 02/02/2020   HDL 43.90 06/27/2019   Lab Results  Component Value Date   LDLCALC 53 11/01/2020   LDLCALC 45 02/02/2020   LDLCALC 48 06/27/2019   Lab Results  Component Value Date   TRIG 153.0 (H) 11/01/2020   TRIG 120.0 02/02/2020   TRIG 144.0 06/27/2019   Lab Results  Component Value Date   CHOLHDL 3 11/01/2020   CHOLHDL 2 02/02/2020   CHOLHDL 3 06/27/2019   Lab Results  Component Value Date   LDLDIRECT 69.0 08/19/2018   LDLDIRECT 80.0 02/06/2018   LDLDIRECT 91.0 08/22/2016            Hypertension: controlled with 5 mg Lisinopril and 25 mg HCTZ Also on Farxiga   BP Readings from Last 3 Encounters:  03/17/21 138/64  11/29/20 128/64  11/04/20 126/78   Lab Results  Component Value Date   K 4.0  03/15/2021    Most recent eye exam was 11/21  She has longstanding neuropathy, 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 on the left side, taking gabapentin 300 mg    LABS:  Lab on 03/15/2021  Component Date Value Ref Range Status   Sodium 03/15/2021 137  135 - 145 mEq/L Final   Potassium 03/15/2021 4.0  3.5 - 5.1 mEq/L Final   Chloride 03/15/2021 97  96 - 112 mEq/L Final   CO2 03/15/2021 30  19 - 32 mEq/L Final   Glucose, Bld 03/15/2021 113 (H)  70 - 99 mg/dL Final   BUN 03/15/2021 25 (H)  6 - 23 mg/dL Final   Creatinine, Ser 03/15/2021 0.64  0.40 - 1.20 mg/dL Final   Total Bilirubin 03/15/2021 0.6  0.2 - 1.2 mg/dL Final   Alkaline Phosphatase 03/15/2021 42  39 - 117 U/L Final   AST 03/15/2021 24  0 - 37 U/L Final   ALT 03/15/2021 29  0 - 35 U/L Final   Total Protein 03/15/2021 6.8  6.0 - 8.3 g/dL Final   Albumin 03/15/2021 4.2  3.5 - 5.2 g/dL Final   GFR 03/15/2021 86.32  >60.00 mL/min Final   Calculated using the CKD-EPI Creatinine Equation (2021)   Calcium 03/15/2021 10.0  8.4 - 10.5 mg/dL Final   Hgb A1c MFr Bld 03/15/2021 7.2 (H)  4.6 - 6.5 % Final   Glycemic Control Guidelines for People with Diabetes:Non Diabetic:  <6%Goal of Therapy: <7%Additional Action Suggested:  >8%     Physical Examination:  BP 138/64    Pulse 65    Ht 5' 8.5" (1.74 m)    Wt 173 lb (78.5 kg)    SpO2 98%    BMI 25.92 kg/m     ASSESSMENT:  Diabetes type 2, non-insulin-dependent  See history of present illness for detailed discussion of current diabetes management, blood sugar patterns and problems identified   Her A1c is relatively higher at 7.2  She is on a 4 drug regimen including Trulicity 3 mg, Xigduo and 2 mg  Amaryl  Blood sugars are not as well controlled with reducing her Xigduo to 1 tablet instead of 2 However she has no diarrhea now and nausea is better Taking Trulicity regularly She however has a large supply of Xigduo because of getting 90-day supply from the patient assistance  program Recently not doing as well on the diet also and has gained 9 pounds   HYPERTENSION: Blood pressure is well controlled    PLAN:   She will increase Amaryl to 1-1/2 tablets in the morning  May increase it further if needed On her next prescription for Xigduo she will take 12/998 strength 1 tablet daily Check blood sugars more often and call if consistently high  Patient Instructions  Take 1 1/2 Glimeperide in ams  Call before renewing Wynne Dust 03/17/2021, 1:40 PM   Note: This office note was prepared with Dragon voice recognition system technology. Any transcriptional errors that result from this process are unintentional.

## 2021-03-17 NOTE — Patient Instructions (Signed)
Take 1 1/2 Glimeperide in ams  Call before renewing Merleen Nicely

## 2021-03-25 ENCOUNTER — Encounter: Payer: Self-pay | Admitting: Endocrinology

## 2021-04-06 ENCOUNTER — Telehealth: Payer: Self-pay | Admitting: Endocrinology

## 2021-04-06 NOTE — Telephone Encounter (Signed)
Pt is calling in to stating that she has some pt assistance pw ([employer]) that need to be completed by the provider and would like to see if she can come by and drop them off.

## 2021-04-07 NOTE — Telephone Encounter (Signed)
Application has been placed on provider's desk

## 2021-04-07 NOTE — Telephone Encounter (Signed)
Patient dropped off patient assistance application with her part completed. Patient once completed by Provider forms be faxed to the number listed on the application. The above documents have been placed in Providers box.

## 2021-04-29 NOTE — Telephone Encounter (Signed)
Application was sent over weeks ago. Received letter today to reach out to [employer]. Reached out to [employer] and wanted to confirm patients insurance. Confirmed and application has been sent to processing team

## 2021-05-05 ENCOUNTER — Other Ambulatory Visit: Payer: Self-pay

## 2021-05-15 ENCOUNTER — Encounter (HOSPITAL_BASED_OUTPATIENT_CLINIC_OR_DEPARTMENT_OTHER): Payer: Self-pay | Admitting: Emergency Medicine

## 2021-05-15 ENCOUNTER — Other Ambulatory Visit: Payer: Self-pay

## 2021-05-15 ENCOUNTER — Emergency Department (HOSPITAL_BASED_OUTPATIENT_CLINIC_OR_DEPARTMENT_OTHER)
Admission: EM | Admit: 2021-05-15 | Discharge: 2021-05-15 | Disposition: A | Discharge status: Home / Self Care | Payer: Medicare Other | Attending: Emergency Medicine | Admitting: Emergency Medicine

## 2021-05-15 DIAGNOSIS — W268XXA Contact with other sharp object(s), not elsewhere classified, initial encounter: Secondary | ICD-10-CM | POA: Diagnosis not present

## 2021-05-15 DIAGNOSIS — S61211A Laceration without foreign body of left index finger without damage to nail, initial encounter: Secondary | ICD-10-CM | POA: Insufficient documentation

## 2021-05-15 DIAGNOSIS — Z7984 Long term (current) use of oral hypoglycemic drugs: Secondary | ICD-10-CM | POA: Diagnosis not present

## 2021-05-15 DIAGNOSIS — E119 Type 2 diabetes mellitus without complications: Secondary | ICD-10-CM | POA: Insufficient documentation

## 2021-05-15 DIAGNOSIS — I10 Essential (primary) hypertension: Secondary | ICD-10-CM | POA: Insufficient documentation

## 2021-05-15 DIAGNOSIS — Z79899 Other long term (current) drug therapy: Secondary | ICD-10-CM | POA: Insufficient documentation

## 2021-05-15 MED ORDER — LIDOCAINE-EPINEPHRINE (PF) 2 %-1:200000 IJ SOLN
20.0000 mL | Freq: Once | INTRAMUSCULAR | Status: AC
  Administered 2021-05-15: 20 mL
  Filled 2021-05-15: qty 20

## 2021-05-15 MED ORDER — BACITRACIN ZINC 500 UNIT/GM EX OINT: TOPICAL_OINTMENT | Freq: Two times a day (BID) | CUTANEOUS | Status: DC

## 2021-05-15 MED ORDER — BACITRACIN ZINC 500 UNIT/GM EX OINT: 1.0000 "application " | TOPICAL_OINTMENT | Freq: Two times a day (BID) | CUTANEOUS | 0 refills | Status: DC

## 2021-05-15 NOTE — ED Triage Notes (Signed)
Pt arrives pov, steady gait with c/o left index finger laceration today. Bleeding controlled. 1.5cm lac noted to distal LUE. ?

## 2021-05-15 NOTE — Discharge Instructions (Addendum)
Please apply topical bacitracin to wound twice a day. Keep area clean. Monitor for signs of infection such as redness, swelling, or fevers. Please return if you develop these concerning symptoms. Return to any medical facility for suture removal in 5-7 days ?

## 2021-05-15 NOTE — ED Notes (Signed)
ED Provider at bedside. 

## 2021-05-15 NOTE — ED Provider Notes (Signed)
Sandra Ray   CSN: [id number] Arrival date & time: 05/15/21  1340     History Past medical history includes diabetes, hypertension, hyperlipidemia Chief Complaint  Patient presents with   Extremity Laceration    Sandra Ray is a 76 y.o. female. Presents emergency department today after having a laceration to her left index finger.  She says earlier this afternoon she was cutting meat when she accidentally cut her her finger.  Laceration is at the palmar side between the DIP and PIP.  She denies any numbness or decrease in range of motion.  She is up-to-date on her tetanus shot.  HPI     Home Medications Prior to Admission medications   Medication Sig Start Date End Date Taking? Authorizing Provider  bacitracin ointment Apply 1 application topically 2 (two) times daily. 05/15/21  Yes Emonnie Cannady, Adora Fridge, PA-C  acetaminophen (TYLENOL) 650 MG CR tablet Take 650 mg by mouth every 8 (eight) hours as needed for pain.    [provider]  atorvastatin (LIPITOR) 40 MG tablet Take 1 tablet (40 mg total) by mouth daily at 6 PM. 12/16/20   Nche, Charlene Brooke, NP  Coenzyme Q10 400 MG CAPS Take 400 mg by mouth daily.     [provider]  Dapagliflozin-metFORMIN HCl ER (XIGDUO XR) 07-998 MG TB24 Take 1 tablet by mouth 2 (two) times daily. Take 1 tablet by mouth twice daily. 01/28/20   Elayne Snare, MD  diclofenac Sodium (VOLTAREN) 1 % GEL Apply topically 4 (four) times daily.    [provider]  Dulaglutide (TRULICITY) 3 KG/4.0NU SOPN Inject 3 mg into the skin once a week. Inject '3mg'$  under the skin once weekly. 07/05/20   Elayne Snare, MD  fluticasone (FLONASE) 50 MCG/ACT nasal spray Place 2 sprays into both nostrils daily. Patient taking differently: Place 2 sprays into both nostrils daily as needed for allergies. 03/30/17   Nche, Charlene Brooke, NP  gabapentin (NEURONTIN) 300 MG capsule Take 1 capsule (300 mg total) by mouth 3  (three) times daily. 12/29/20   Nche, Charlene Brooke, NP  Garlic 2725 MG CAPS Take 1,000 mg by mouth daily.     [provider]  glimepiride (AMARYL) 2 MG tablet TAKE 1 TABLET BY MOUTH  DAILY BEFORE BREAKFAST 11/18/20   Elayne Snare, MD  hydrochlorothiazide (HYDRODIURIL) 25 MG tablet Take 1 tablet (25 mg total) by mouth daily. 12/30/20   Nche, Charlene Brooke, NP  Lancets (ONETOUCH ULTRASOFT) lancets Use as instructed once day.  E11.40 05/18/16   Roma Schanz R, DO  lisinopril (ZESTRIL) 5 MG tablet TAKE 1 TABLET BY MOUTH  DAILY 09/27/20   Nche, Charlene Brooke, NP  omega-3 acid ethyl esters (LOVAZA) 1 g capsule Take 1 capsule (1 g total) by mouth 2 (two) times daily. 05/16/18   Elayne Snare, MD  ondansetron (ZOFRAN) 4 MG tablet Take 1 tablet (4 mg total) by mouth as needed for nausea or vomiting. 02/12/20   Elayne Snare, MD  [facility] VERIO test strip USE AS INSTRUCTED TO CHECK  BLOOD SUGAR TWICE DAILY 12/15/20   Elayne Snare, MD  potassium chloride SA (KLOR-CON M) 20 MEQ tablet TAKE 1 TABLET BY MOUTH  DAILY 02/10/21   Nche, Charlene Brooke, NP      Allergies    Penicillins, Amoxicillin, and Erythromycin    Review of Systems   Review of Systems  Skin:  Positive for wound.  All other systems reviewed and are negative.  Physical  Exam Updated Vital Signs BP (!) 132/51 (BP Location: Right Arm)    Pulse 74    Temp 98.5 F (36.9 C) (Oral)    Resp 18    Ht '5\' 8"'$  (1.727 m)    Wt 74.8 kg    SpO2 99%    BMI 25.09 kg/m  Physical Exam Vitals and nursing Ray reviewed.  Constitutional:      General: She is not in acute distress.    Appearance: Normal appearance. She is well-developed. She is not ill-appearing, toxic-appearing or diaphoretic.  HENT:     Head: Normocephalic and atraumatic.     Nose: No nasal deformity.     Mouth/Throat:     Lips: Pink. No lesions.  Eyes:     General: Gaze aligned appropriately. No scleral icterus.       Right eye: No discharge.        Left eye: No discharge.      Conjunctiva/sclera: Conjunctivae normal.     Right eye: Right conjunctiva is not injected. No exudate or hemorrhage.    Left eye: Left conjunctiva is not injected. No exudate or hemorrhage. Pulmonary:     Effort: Pulmonary effort is normal. No respiratory distress.  Skin:    General: Skin is warm and dry.     Findings: Laceration present.     Comments: 1.5 cm laceration that is linear on the left index finger on the palmar side.  Laceration is in between the DIP and PIP joint.  It is still oozing slightly.  Laceration is linear and well approximated.  Unable to visualize the depth at this time.  Patient is able to fully move all finger joints.  She is able to make a fist.  She has normal sensation.  Capillary refill is intact.  Neurological:     Mental Status: She is alert and oriented to person, place, and time.  Psychiatric:        Mood and Affect: Mood normal.        Speech: Speech normal.        Behavior: Behavior normal. Behavior is cooperative.    ED Results / Procedures / Treatments   Labs (all labs ordered are listed, but only abnormal results are displayed) Labs Reviewed - No data to display  EKG None  Radiology No results found.  Procedures .Marland KitchenLaceration Repair  Date/Time: 05/15/2021 7:08 PM Performed by: Adolphus Birchwood, PA-C Authorized by: Adolphus Birchwood, PA-C   Consent:    Consent obtained:  Verbal   Consent given by:  Patient   Risks, benefits, and alternatives were discussed: yes     Risks discussed:  Infection, need for additional repair, nerve damage, poor wound healing, poor cosmetic result, pain, retained foreign body, tendon damage and vascular damage   Alternatives discussed:  No treatment and delayed treatment Universal protocol:    Procedure explained and questions answered to patient or proxy's satisfaction: yes     Patient identity confirmed:  Verbally with patient Anesthesia:    Anesthesia method:  Nerve block   Block needle gauge:  27 G    Block anesthetic:  Lidocaine 2% WITH epi   Block injection procedure:  Anatomic landmarks identified, introduced needle, negative aspiration for blood, incremental injection and anatomic landmarks palpated   Block outcome:  Anesthesia achieved Laceration details:    Location:  Finger   Finger location:  L index finger   Length (cm):  1.5 Exploration:    Hemostasis achieved with:  Direct pressure and  epinephrine   Wound exploration: entire depth of wound visualized     Wound extent: areolar tissue violated     Wound extent: no fascia violation noted, no foreign bodies/material noted, no muscle damage noted, no nerve damage noted, no tendon damage noted, no underlying fracture noted and no vascular damage noted     Contaminated: no   Treatment:    Area cleansed with:  Povidone-iodine   Amount of cleaning:  Standard   Irrigation solution:  Sterile saline   Irrigation method:  Syringe   Visualized foreign bodies/material removed: no     Debridement:  None   Undermining:  None Skin repair:    Repair method:  Sutures   Suture size:  6-0   Suture material:  Prolene   Suture technique:  Simple interrupted   Number of sutures:  6 Repair type:    Repair type:  Simple Post-procedure details:    Dressing:  Antibiotic ointment and non-adherent dressing   Procedure completion:  Tolerated   Medications Ordered in ED Medications  bacitracin ointment (has no administration in time range)  lidocaine-EPINEPHrine (XYLOCAINE W/EPI) 2 %-1:200000 (PF) injection 20 mL (20 mLs Infiltration Given by Other 05/15/21 1523)    ED Course/ Medical Decision Making/ A&P                           Medical Decision Making Risk OTC drugs. Prescription drug management.    MDM  This is a 76 y.o. female with a pertinent PMH of DM who presents to the ED with a laceration to her left index finger. Laceration is linear and well approximated.  I need to clean this out to visualize the entire depth.  She is able  to move all joints of this finger, so I doubt there is any tendon involvement.  She seems to be neurovascularly intact as well.  It is still bleeding slightly, but will apply pressure.  Plan to close this wound by using digital block.  Seizures outlined above.  She tolerated procedure without any difficulty.  She will be discharged home with antibiotic ointment. Suture removal in suture removal in 5 to 7 days.     Charting Requirements Additional history is obtained from:  Independent historian External Records from outside source obtained and reviewed including: n/a Social Determinants of Health:  none Pertinant PMH that complicates patient's illness: DM  Patient Care Problems that were addressed during this visit: - Laceration: Acute illness Disposition: return for suture removal in 5-7 days  I have discussed this patient with my attending physician, Dr. Regenia Skeeter who has made changes to the plan accordingly.  Portions of this Ray were generated with [employer]. Dictation errors may occur despite best attempts at proofreading.     Final Clinical Impression(s) / ED Diagnoses Final diagnoses:  Laceration of left index finger without foreign body without damage to nail, initial encounter    Rx / DC Orders ED Discharge Orders          Ordered    bacitracin ointment  2 times daily        05/15/21 1704              Adolphus Birchwood, PA-C 05/15/21 1911    Sherwood Gambler, MD 05/16/21 [phone]

## 2021-05-20 ENCOUNTER — Ambulatory Visit (INDEPENDENT_AMBULATORY_CARE_PROVIDER_SITE_OTHER): Payer: Medicare Other | Admitting: Family Medicine

## 2021-05-20 ENCOUNTER — Other Ambulatory Visit: Payer: Self-pay

## 2021-05-20 VITALS — BP 130/68 | HR 71 | Temp 97.9°F | Ht 68.0 in | Wt 177.8 lb

## 2021-05-20 DIAGNOSIS — S61211D Laceration without foreign body of left index finger without damage to nail, subsequent encounter: Secondary | ICD-10-CM

## 2021-05-20 NOTE — Progress Notes (Signed)
?[facility] ?[facility] ?[place] ?[place] [address] ?Dept: [phone] ?Dept Fax: [phone] ? ?Office Visit ? ?Subjective:  ? ? Patient ID: Sandra Ray, female    DOB: 1945/04/28, 76 y.o..   MRN: [id number] ? ?Chief Complaint  ?Patient presents with  ? Follow-up  ?  Fiu to have stitches removed from LT index finger (05/15/21).  ? ? ?History of Present Illness: ? ?Patient is in today for a wound check. She had a 1.5 cm laceration to her left index finger on 05/15/2021. She notes she was cutting open a package of hamburger meat. This was sutured at [facility]. She notes the wound is not giving her any trouble. She is flexing and extending the finger without trouble. ? ?Past Medical History: ?Patient Active Problem List  ? Diagnosis Date Noted  ? Asymptomatic age-related postmenopausal state 11/29/2020  ? Mixed stress and urge urinary incontinence 11/29/2020  ? Left ovarian cyst 08/09/2017  ? Pain of left hip joint 07/20/2017  ? Hyperlipidemia LDL goal <70 11/21/2015  ? Personal history of failed moderate sedation 12/02/2014  ? Hx of colonic polyps 12/02/2014  ? Intestinal adhesions 12/02/2014  ? Metatarsal deformity 01/07/2014  ? Hammer toe of right foot 01/07/2014  ? Equinus deformity of foot, acquired 12/08/2013  ? Metatarsalgia of right foot 12/08/2013  ? Ingrown nail 12/08/2013  ? Pain in lower limb 12/08/2013  ? Pain in finger of right hand 09/22/2013  ? Shingles 04/15/2013  ? Abdominal wall abscess, right upper quadrant 04/07/2013  ? S/P breast biopsy, left 10/24/2012  ? Hand eczema 03/20/2012  ? Muscle spasm 09/18/2011  ? DNR (do not resuscitate) discussion 06/19/2011  ? Carpal tunnel syndrome 10/20/2010  ? NEVI, MULTIPLE 03/28/2010  ? MENOPAUSE, SURGICAL 02/15/2009  ? AUTONOMIC NEUROPATHY, DIABETIC 06/10/2008  ? HYPOKALEMIA 06/10/2008  ? Acute non-recurrent maxillary sinusitis 02/21/2008  ? DM neuropathy, type II diabetes mellitus ([place]) 01/31/2008   ? Essential hypertension 01/31/2008  ? DEGENERATIVE JOINT DISEASE, SPINE 01/31/2008  ? LAMINECTOMY, LUMBAR, HX OF 01/31/2008  ? ?Past Surgical History:  ?Procedure Laterality Date  ? ABDOMINAL HYSTERECTOMY  1981  ? menorrhagia  ? ABSCESS DRAINAGE Right   ? breast  ? APPENDECTOMY  1966  ? done with CSxn. Prophylactic  ? BREAST BIOPSY Left   ? carpel tunnel Right   ? CESAREAN SECTION    ? x2  ? COLONOSCOPY    ? LUMBAR LAMINECTOMY    ? 5732,2025. 2011 L4,L5,S1  ? Right rotator cuff repair 09/2015    ? ROBOTIC ASSISTED SALPINGO OOPHERECTOMY Left 04/16/2018  ? Procedure: XI ROBOTIC ASSISTED  LEFT SALPINGO OOPHORECTOMY;  Surgeon: [name], MD;  Location: [facility];  Service: Gynecology;  Laterality: Left;  ? TUBAL LIGATION    ? ?Family History  ?Problem Relation Age of Onset  ? Hypertension Father   ? Diabetes Mother   ? Stroke Mother   ? Hypertension Mother   ? High Cholesterol Mother   ? Heart disease Mother   ? Diabetes Brother   ? Breast cancer Daughter 17  ?     unsure if did genetic testing  ? Breast cancer Maternal Aunt   ? Breast cancer Sister 40  ? Colon polyps Brother   ? ?Outpatient Medications Prior to Visit  ?Medication Sig Dispense Refill  ? acetaminophen (TYLENOL) 650 MG CR tablet Take 650 mg by mouth every 8 (eight) hours as needed for pain.    ? atorvastatin (LIPITOR)  40 MG tablet Take 1 tablet (40 mg total) by mouth daily at 6 PM. 90 tablet 3  ? bacitracin ointment Apply 1 application topically 2 (two) times daily. 120 g 0  ? Coenzyme Q10 400 MG CAPS Take 400 mg by mouth daily.     ? Dapagliflozin-metFORMIN HCl ER (XIGDUO XR) 07-998 MG TB24 Take 1 tablet by mouth 2 (two) times daily. Take 1 tablet by mouth twice daily. 180 tablet 4  ? diclofenac Sodium (VOLTAREN) 1 % GEL Apply topically 4 (four) times daily.    ? Dulaglutide (TRULICITY) 3 YK/9.9IP SOPN Inject 3 mg into the skin once a week. Inject '3mg'$  under the skin once weekly. 36 mL 3  ? fluticasone (FLONASE) 50 MCG/ACT nasal spray Place 2 sprays  into both nostrils daily. (Patient taking differently: Place 2 sprays into both nostrils daily as needed for allergies.) 16 g 0  ? gabapentin (NEURONTIN) 300 MG capsule Take 1 capsule (300 mg total) by mouth 3 (three) times daily. 270 capsule 3  ? Garlic 3825 MG CAPS Take 1,000 mg by mouth daily.     ? glimepiride (AMARYL) 2 MG tablet TAKE 1 TABLET BY MOUTH  DAILY BEFORE BREAKFAST 90 tablet 3  ? hydrochlorothiazide (HYDRODIURIL) 25 MG tablet Take 1 tablet (25 mg total) by mouth daily. 90 tablet 1  ? Lancets (ONETOUCH ULTRASOFT) lancets Use as instructed once day.  E11.40 100 each 5  ? lisinopril (ZESTRIL) 5 MG tablet TAKE 1 TABLET BY MOUTH  DAILY 90 tablet 3  ? omega-3 acid ethyl esters (LOVAZA) 1 g capsule Take 1 capsule (1 g total) by mouth 2 (two) times daily. 60 capsule 5  ? ondansetron (ZOFRAN) 4 MG tablet Take 1 tablet (4 mg total) by mouth as needed for nausea or vomiting. 30 tablet 1  ? ONETOUCH VERIO test strip USE AS INSTRUCTED TO CHECK  BLOOD SUGAR TWICE DAILY 200 strip 3  ? potassium chloride SA (KLOR-CON M) 20 MEQ tablet TAKE 1 TABLET BY MOUTH  DAILY 90 tablet 3  ? ?No facility-administered medications prior to visit.  ? ?Allergies  ?Allergen Reactions  ? Penicillins   ?  Causes yeast infections ?Did it involve swelling of the face/tongue/throat, SOB, or low BP? No ?Did it involve sudden or severe rash/hives, skin peeling, or any reaction on the inside of your mouth or nose? No ?Did you need to seek medical attention at a hospital or doctor's office? No ?When did it last happen?  2018     ?If all above answers are ?NO?, may proceed with cephalosporin use. ?  ? Amoxicillin Other (See Comments)  ?  Pt states it causes her a yeast infection  ?Causes yeast infections ?Did it involve swelling of the face/tongue/throat, SOB, or low BP? No ?Did it involve sudden or severe rash/hives, skin peeling, or any reaction on the inside of your mouth or nose? No ?Did you need to seek medical attention at a hospital or  doctor's office? No ?When did it last happen?  2018     ?If all above answers are "NO", may proceed with cephalosporin use.  ? Erythromycin   ?  Passed out while taking  ?   ?Objective:  ? ?Today's Vitals  ? 05/20/21 0830  ?BP: 130/68  ?Pulse: 71  ?Temp: 97.9 ?F (36.6 ?C)  ?TempSrc: Temporal  ?SpO2: 99%  ?Weight: 177 lb 12.8 oz (80.6 kg)  ?Height: '5\' 8"'$  (1.727 m)  ? ?Body mass index is 27.03 kg/m?Marland Kitchen  ? ?  General: Well developed, well nourished. No acute distress. ?Extremities: laceration healing on the palmar aspect of the proximal left index finger. Six  ? interrupted sutures in place.  ?Psych: Alert and oriented. Normal mood and affect. ? ?Health Maintenance Due  ?Topic Date Due  ? Zoster Vaccines- Shingrix (1 of 2) Never done  ? ?Suture Removal:   With verbal consent of the patient, I removed 3 sutures. Minimal bleeding noted. Sterile bandage applied. Patient tolerated well. ? ?Assessment & Plan:  ? ?1. Laceration of left index finger without foreign body without damage to nail, subsequent encounter ?As it has only been 5 days since the wound was closed, I felt there was risk for dehiscence if we removed all of the stiches today. I recommend Ms. Dampier continue to minimize use of the hand over the weekend. We will reassess this on Monday with plans to remove the other stitches. ? ?Return in about 3 days (around 05/23/2021) for Suture removal.  ? ?Haydee Salter, MD ?

## 2021-05-20 NOTE — Patient Instructions (Signed)
Sutured Wound Care ?Sutures are stitches that can be used to close wounds. Some stitches break down as they heal (absorbable). Other stitches need to be taken out by your doctor (nonabsorbable). Taking good care of your wound can help to prevent pain and infection. It can also help your wound heal more quickly. Follow instructions from your doctor about how to care for your sutured wound. ?Supplies needed: ?Soap and water. ?A clean, dry towel. ?Solution to clean your wound, if needed. ?A clean gauze or bandage (dressing), if needed. ?Antibiotic ointment, if told by your doctor. ?How to care for your sutured wound ? ?Keep the wound fully dry for the first 24 hours or as long as told by your doctor. After 24-48 hours, you may shower or bathe as told by your doctor. Do not soak the wound or put the wound under water until the stitches have been taken out. ?After the first 24 hours, clean the wound once a day, or as often as your doctor tells you to. Take these steps: ?Wash and rinse the wound as told by your health care provider. ?Pat the wound dry with a clean towel. Do not rub the wound. ?After cleaning the wound, put a thin layer of antibiotic ointment on the wound as told by your doctor. This will help: ?Prevent infection. ?Keep the bandage from sticking to the wound. ?Follow instructions from your doctor about how to change your bandage. Make sure you: ?Wash your hands with soap and water for at least 20 seconds. If you cannot use soap and water, use hand sanitizer. ?Change your bandage at least once a day, or as often as told by your doctor. If your dressing gets wet or dirty, change it. ?Leavestitches in place for at least 2 weeks. If you have skin glue over your stitches, this should also stay in place for at least 2 weeks. ?Leave tape strips alone (if you have them) unless you are told to take them off. You may trim the edges of the tape strips if they curl up. ?Check your wound every day for signs of  infection. Watch for: ?Redness, swelling, or pain. ?Fluid or blood. ?New warmth, a rash, or hardness at the wound site. ?Pus or a bad smell. ?Have the stitches taken out as told by your doctor. ?Follow these instructions at home: ?Medicines ?Take or apply over-the-counter and prescription medicines only as told by your doctor. ?If you were prescribed an antibiotic medicine or ointment, take or apply it as told by your doctor. Do not stop using the antibiotic even if you start to feel better. ?General instructions ?Cover your wound with clothes or put sunscreen on when you are outside. Use a sunscreen of at least 30 SPF. ?Do not scratch or pick at your wound. ?Avoid stretching your wound. ?Raise the injured area above the level of your heart while you are sitting or lying down, if possible. ?Eat a diet that includes protein, vitamin A, and vitamin C. Doing this will help your wound heal. ?Drink enough fluid to keep your pee (urine) pale yellow. ?Keep all follow-up visits. ?Contact a doctor if: ?You were given a tetanus shot and you have any of the following at the site where the needle went in: ?Swelling. ?Very bad pain. ?Redness. ?Bleeding. ?Your wound breaks open. ?You see something coming out of your wound, such as wood or glass. ?You have any of these signs of infection in or around your wound: ?Redness, swelling, or pain. ?Fluid or blood. ?  Warmth. ?A new rash. ?Your wound feels hard. ?You have a fever. ?The skin near your wound changes color. ?You have pain that does not get better with medicine. ?You get numbness around the wound. ?Get help right away if: ?You have very bad swelling or more pain around your wound. ?You have pus or a bad smell coming from your wound. ?You have painful lumps near your wound or anywhere on your body. ?You have a red streak going away from your wound. ?The wound is on your hand or foot, and: ?Your fingers or toes look pale or blue. ?You cannot move a finger or toe as you used to  do. ?You have numbness that spreads down your hand, foot, fingers, or toes. ?Summary ?Sutures are stitches that are used to close wounds. ?Taking good care of your wound can help to prevent pain and infection. ?Keep the wound fully dry for the first 24 hours or for as long as told by your doctor. After 24-48 hours, you may shower or bathe as told by your doctor. ?This information is not intended to replace advice given to you by your health care provider. Make sure you discuss any questions you have with your health care provider. ?Document Revised: 07/05/2020 Document Reviewed: 07/05/2020 ?Elsevier Patient Education ? [place]. ? ?

## 2021-05-23 ENCOUNTER — Other Ambulatory Visit: Payer: Self-pay

## 2021-05-23 ENCOUNTER — Ambulatory Visit (INDEPENDENT_AMBULATORY_CARE_PROVIDER_SITE_OTHER): Payer: Medicare Other | Admitting: Family Medicine

## 2021-05-23 VITALS — BP 130/76 | HR 73 | Temp 97.7°F | Ht 68.0 in | Wt 174.4 lb

## 2021-05-23 DIAGNOSIS — S61211D Laceration without foreign body of left index finger without damage to nail, subsequent encounter: Secondary | ICD-10-CM | POA: Diagnosis not present

## 2021-05-23 NOTE — Progress Notes (Signed)
?[facility] ?[facility] ?[place] ?[place] [address] ?Dept: [phone] ?Dept Fax: [phone] ? ?Office Visit ? ?Subjective:  ? ? Patient ID: Sandra Ray, female    DOB: 13-Oct-1945, 76 y.o..   MRN: [id number] ? ?Chief Complaint  ?Patient presents with  ? Follow-up  ?  F/u to remove stiches from finger.   ? ? ?History of Present Illness: ? ?Patient is in today for a wound check. She had a 1.5 cm laceration to her left index finger on 05/15/2021. She notes she was cutting open a package of hamburger meat. This was sutured at [facility]. She notes the wound is not giving her any trouble. She is flexing and extending the finger without trouble. I took out 3 stitches on 3/10.  ? ?Past Medical History: ?Patient Active Problem List  ? Diagnosis Date Noted  ? Asymptomatic age-related postmenopausal state 11/29/2020  ? Mixed stress and urge urinary incontinence 11/29/2020  ? Left ovarian cyst 08/09/2017  ? Pain of left hip joint 07/20/2017  ? Hyperlipidemia LDL goal <70 11/21/2015  ? Personal history of failed moderate sedation 12/02/2014  ? Hx of colonic polyps 12/02/2014  ? Intestinal adhesions 12/02/2014  ? Metatarsal deformity 01/07/2014  ? Hammer toe of right foot 01/07/2014  ? Equinus deformity of foot, acquired 12/08/2013  ? Metatarsalgia of right foot 12/08/2013  ? Ingrown nail 12/08/2013  ? Pain in lower limb 12/08/2013  ? Pain in finger of right hand 09/22/2013  ? Shingles 04/15/2013  ? Abdominal wall abscess, right upper quadrant 04/07/2013  ? S/P breast biopsy, left 10/24/2012  ? Hand eczema 03/20/2012  ? Muscle spasm 09/18/2011  ? DNR (do not resuscitate) discussion 06/19/2011  ? Carpal tunnel syndrome 10/20/2010  ? NEVI, MULTIPLE 03/28/2010  ? MENOPAUSE, SURGICAL 02/15/2009  ? AUTONOMIC NEUROPATHY, DIABETIC 06/10/2008  ? HYPOKALEMIA 06/10/2008  ? Acute non-recurrent maxillary sinusitis 02/21/2008  ? DM neuropathy, type II diabetes mellitus ([place])  01/31/2008  ? Essential hypertension 01/31/2008  ? DEGENERATIVE JOINT DISEASE, SPINE 01/31/2008  ? LAMINECTOMY, LUMBAR, HX OF 01/31/2008  ? ?Past Surgical History:  ?Procedure Laterality Date  ? ABDOMINAL HYSTERECTOMY  1981  ? menorrhagia  ? ABSCESS DRAINAGE Right   ? breast  ? APPENDECTOMY  1966  ? done with CSxn. Prophylactic  ? BREAST BIOPSY Left   ? carpel tunnel Right   ? CESAREAN SECTION    ? x2  ? COLONOSCOPY    ? LUMBAR LAMINECTOMY    ? 4008,6761. 2011 L4,L5,S1  ? Right rotator cuff repair 09/2015    ? ROBOTIC ASSISTED SALPINGO OOPHERECTOMY Left 04/16/2018  ? Procedure: XI ROBOTIC ASSISTED  LEFT SALPINGO OOPHORECTOMY;  Surgeon: [name], MD;  Location: [facility];  Service: Gynecology;  Laterality: Left;  ? TUBAL LIGATION    ? ?Family History  ?Problem Relation Age of Onset  ? Hypertension Father   ? Diabetes Mother   ? Stroke Mother   ? Hypertension Mother   ? High Cholesterol Mother   ? Heart disease Mother   ? Diabetes Brother   ? Breast cancer Daughter 19  ?     unsure if did genetic testing  ? Breast cancer Maternal Aunt   ? Breast cancer Sister 69  ? Colon polyps Brother   ? ?Outpatient Medications Prior to Visit  ?Medication Sig Dispense Refill  ? acetaminophen (TYLENOL) 650 MG CR tablet Take 650 mg by mouth every 8 (eight) hours as needed for pain.    ?  atorvastatin (LIPITOR) 40 MG tablet Take 1 tablet (40 mg total) by mouth daily at 6 PM. 90 tablet 3  ? bacitracin ointment Apply 1 application topically 2 (two) times daily. 120 g 0  ? Coenzyme Q10 400 MG CAPS Take 400 mg by mouth daily.     ? Dapagliflozin-metFORMIN HCl ER (XIGDUO XR) 07-998 MG TB24 Take 1 tablet by mouth 2 (two) times daily. Take 1 tablet by mouth twice daily. 180 tablet 4  ? diclofenac Sodium (VOLTAREN) 1 % GEL Apply topically 4 (four) times daily.    ? Dulaglutide (TRULICITY) 3 DZ/3.2DJ SOPN Inject 3 mg into the skin once a week. Inject '3mg'$  under the skin once weekly. 36 mL 3  ? fluticasone (FLONASE) 50 MCG/ACT nasal spray Place  2 sprays into both nostrils daily. (Patient taking differently: Place 2 sprays into both nostrils daily as needed for allergies.) 16 g 0  ? gabapentin (NEURONTIN) 300 MG capsule Take 1 capsule (300 mg total) by mouth 3 (three) times daily. 270 capsule 3  ? Garlic 2426 MG CAPS Take 1,000 mg by mouth daily.     ? glimepiride (AMARYL) 2 MG tablet TAKE 1 TABLET BY MOUTH  DAILY BEFORE BREAKFAST 90 tablet 3  ? hydrochlorothiazide (HYDRODIURIL) 25 MG tablet Take 1 tablet (25 mg total) by mouth daily. 90 tablet 1  ? Lancets (ONETOUCH ULTRASOFT) lancets Use as instructed once day.  E11.40 100 each 5  ? lisinopril (ZESTRIL) 5 MG tablet TAKE 1 TABLET BY MOUTH  DAILY 90 tablet 3  ? omega-3 acid ethyl esters (LOVAZA) 1 g capsule Take 1 capsule (1 g total) by mouth 2 (two) times daily. 60 capsule 5  ? ondansetron (ZOFRAN) 4 MG tablet Take 1 tablet (4 mg total) by mouth as needed for nausea or vomiting. 30 tablet 1  ? ONETOUCH VERIO test strip USE AS INSTRUCTED TO CHECK  BLOOD SUGAR TWICE DAILY 200 strip 3  ? potassium chloride SA (KLOR-CON M) 20 MEQ tablet TAKE 1 TABLET BY MOUTH  DAILY 90 tablet 3  ? ?No facility-administered medications prior to visit.  ? ?Allergies  ?Allergen Reactions  ? Penicillins   ?  Causes yeast infections ?Did it involve swelling of the face/tongue/throat, SOB, or low BP? No ?Did it involve sudden or severe rash/hives, skin peeling, or any reaction on the inside of your mouth or nose? No ?Did you need to seek medical attention at a hospital or doctor's office? No ?When did it last happen?  2018     ?If all above answers are ?NO?, may proceed with cephalosporin use. ?  ? Amoxicillin Other (See Comments)  ?  Pt states it causes her a yeast infection  ?Causes yeast infections ?Did it involve swelling of the face/tongue/throat, SOB, or low BP? No ?Did it involve sudden or severe rash/hives, skin peeling, or any reaction on the inside of your mouth or nose? No ?Did you need to seek medical attention at a  hospital or doctor's office? No ?When did it last happen?  2018     ?If all above answers are "NO", may proceed with cephalosporin use.  ? Erythromycin   ?  Passed out while taking  ? ?   ?Objective:  ? ?Today's Vitals  ? 05/23/21 1314  ?BP: 130/76  ?Pulse: 73  ?Temp: 97.7 ?F (36.5 ?C)  ?TempSrc: Temporal  ?SpO2: 96%  ?Weight: 174 lb 6.4 oz (79.1 kg)  ?Height: '5\' 8"'$  (1.727 m)  ? ?Body mass index is 26.52  kg/m?.  ? ?General: Well developed, well nourished. No acute distress. ?Extremities:Laceration healing on the palmar aspect of the proximal left index finger. Three  ? interrupted sutures in place.  ?Psych: Alert and oriented. Normal mood and affect. ? ?Health Maintenance Due  ?Topic Date Due  ? Zoster Vaccines- Shingrix (1 of 2) Never done  ? ?Suture Removal:   With verbal consent of the patient, I removed 3 sutures. Minimal bleeding noted. Sterile bandage applied. Patient tolerated well. ?   ?Assessment & Plan:  ? ?1. Laceration of left index finger without foreign body without damage to nail, subsequent encounter ?I recommend Ms. Odum keep the site covered until healing complete. ? ?Return if symptoms worsen or fail to improve.  ? ?Haydee Salter, MD ?

## 2021-05-24 NOTE — Telephone Encounter (Signed)
Called and spoke with rep at [facility] who confirmed pt application has been approved for [employer]. It was just approved 05/24/21 and needs 24-48 hours to update in the system. Was advised if we do not hear anything by the end of the week to contact [place] at [phone]. ?

## 2021-05-24 NOTE — Telephone Encounter (Signed)
[place]. Patient should receive medication by the end of week. Called and left detail voicemail for patient. ?

## 2021-05-30 ENCOUNTER — Ambulatory Visit (INDEPENDENT_AMBULATORY_CARE_PROVIDER_SITE_OTHER): Payer: Medicare Other | Admitting: Nurse Practitioner

## 2021-05-30 ENCOUNTER — Other Ambulatory Visit: Payer: Self-pay

## 2021-05-30 ENCOUNTER — Encounter: Payer: Self-pay | Admitting: Nurse Practitioner

## 2021-05-30 VITALS — BP 136/64 | HR 88 | Temp 97.5°F | Ht 68.0 in | Wt 172.0 lb

## 2021-05-30 DIAGNOSIS — E876 Hypokalemia: Secondary | ICD-10-CM

## 2021-05-30 DIAGNOSIS — E785 Hyperlipidemia, unspecified: Secondary | ICD-10-CM

## 2021-05-30 DIAGNOSIS — G8929 Other chronic pain: Secondary | ICD-10-CM

## 2021-05-30 DIAGNOSIS — I10 Essential (primary) hypertension: Secondary | ICD-10-CM

## 2021-05-30 DIAGNOSIS — M5451 Vertebrogenic low back pain: Secondary | ICD-10-CM | POA: Diagnosis not present

## 2021-05-30 DIAGNOSIS — M5441 Lumbago with sciatica, right side: Secondary | ICD-10-CM

## 2021-05-30 DIAGNOSIS — K5904 Chronic idiopathic constipation: Secondary | ICD-10-CM | POA: Diagnosis not present

## 2021-05-30 DIAGNOSIS — E1151 Type 2 diabetes mellitus with diabetic peripheral angiopathy without gangrene: Secondary | ICD-10-CM | POA: Insufficient documentation

## 2021-05-30 LAB — BASIC METABOLIC PANEL
BUN: 24 mg/dL — ABNORMAL HIGH (ref 6–23)
CO2: 27 mEq/L (ref 19–32)
Calcium: 10.7 mg/dL — ABNORMAL HIGH (ref 8.4–10.5)
Chloride: 98 mEq/L (ref 96–112)
Creatinine, Ser: 0.7 mg/dL (ref 0.40–1.20)
GFR: 84.35 mL/min (ref >60.00)
Glucose, Bld: 129 mg/dL — ABNORMAL HIGH (ref 70–99)
Potassium: 3.7 mEq/L (ref 3.5–5.1)
Sodium: 136 mEq/L (ref 135–145)

## 2021-05-30 LAB — MICROALBUMIN / CREATININE URINE RATIO
Creatinine,U: 136.6 mg/dL
Microalb Creat Ratio: 7.6 mg/g (ref 0.0–30.0)
Microalb, Ur: 10.3 mg/dL — ABNORMAL HIGH (ref 0.0–1.9)

## 2021-05-30 LAB — LIPID PANEL
Cholesterol: 127 mg/dL (ref 0–200)
HDL: 52.5 mg/dL (ref >39.00)
LDL Cholesterol: 42 mg/dL (ref 0–99)
NonHDL: 74.25
Total CHOL/HDL Ratio: 2
Triglycerides: 160 mg/dL — ABNORMAL HIGH (ref 0.0–149.0)
VLDL: 32 mg/dL (ref 0.0–40.0)

## 2021-05-30 MED ORDER — HYDROCHLOROTHIAZIDE 25 MG PO TABS: 25.0000 mg | ORAL_TABLET | Freq: Every day | ORAL | 1 refills | Status: DC

## 2021-05-30 MED ORDER — ATORVASTATIN CALCIUM 40 MG PO TABS: 40.0000 mg | ORAL_TABLET | Freq: Every day | ORAL | 3 refills | Status: DC

## 2021-05-30 MED ORDER — LISINOPRIL 5 MG PO TABS: 5.0000 mg | ORAL_TABLET | Freq: Every day | ORAL | 3 refills | Status: DC

## 2021-05-30 NOTE — Patient Instructions (Addendum)
For Constipation: try Fibercon or metamucil or benefiber: 1dose 1-2x/day ?Maintain water intake at least 60oz per day. ? ?Go to lab for blood draw and urine collection. ? ?Let me know if you want to try meloxicam 7.'5mg'$  for pain. ? ? ?

## 2021-05-30 NOTE — Assessment & Plan Note (Signed)
Chronic, waxing and waxing, BM 2x/week, associated with ABD pain and bloating. ?No OTC med used. ? ?Advised to try Fibercon or metamucil or benefiber: 1dose 1-2x/day ?Maintain water intake at least 60oz per day. ? ? ?

## 2021-05-30 NOTE — Assessment & Plan Note (Signed)
Ongoing for several years, worse in last 36month no injury ?Pain radiates to right thigh, worse with prolonged standing and walking. ?This limits her ADLs. ?S/p spinal fusion by Dr. BRolena Infantewith [facility] ?Received several epidural injections in past, no improvement. ?current use of Use of tylenol '650mg'$  2tabd daily and voltaren gel, minimal improvement. ?Also take gabapentin '300mg'$  TID ?Does not want an opioid or muscle relexant due to possible side effects and risk of dependence. ?She has appt with Dr.Brooks today. ? ?

## 2021-05-30 NOTE — Progress Notes (Signed)
? ?             Established Patient Visit ? ?Patient: Sandra Ray   DOB: 04-03-1945   76 y.o. Female  MRN: [id number] ?Visit Date: 05/30/2021 ? ?Subjective:  ?  ?Chief Complaint  ?Patient presents with  ? Follow-up  ?  6 month f/u on HTN and cholesterol.  ?Pt plans to get Shingles vaccine at retail pharmacy.  ?Pt is fasting.  ?No other questions or concerns.   ?  ?Accompanied by Husband ? ?HPI ?Chronic bilateral low back pain with right-sided sciatica ?Ongoing for several years, worse in last 46month no injury ?Pain radiates to right thigh, worse with prolonged standing and walking. ?This limits her ADLs. ?S/p spinal fusion by Dr. BRolena Infantewith [facility] ?Received several epidural injections in past, no improvement. ?current use of Use of tylenol '650mg'$  2tabd daily and voltaren gel, minimal improvement. ?Also take gabapentin '300mg'$  TID ?Does not want an opioid or muscle relexant due to possible side effects and risk of dependence. ?She has appt with Dr.Brooks today. ? ? ?Type II diabetes mellitus with peripheral artery disease ([place] ?Stable neuropathy with gabapentin '300mg'$  TID. ?DM managed by DEdd Fabian upcoming appt 06/09/2021 ?Current use of Trulicity, glimepiride, xigduo ?Use of lisinopril ?Reports resolved diarrhea with decrease in metformin dose ? ?Maintain gabapentin dose ?Collect urine microalbumin. ? ?Hyperlipidemia LDL goal <70 ?Current use of lovaza and atorvastatin with no adverse side effects. ?Repeat lipid panel ?Maintain current medications ? ? ?Essential hypertension ?BP at goal with lisinopril, hctz and potassium ?BP Readings from Last 3 Encounters:  ?05/30/21 136/64  ?05/23/21 130/76  ?05/20/21 130/68  ? ?Repeat BMP ?Maintain current med doses ? ? ?Chronic idiopathic constipation ?Chronic, waxing and waxing, BM 2x/week, associated with ABD pain and bloating. ?No OTC med used. ? ?Advised to try Fibercon or metamucil or benefiber: 1dose 1-2x/day ?Maintain water intake at least 60oz per day. ? ? Diarrhea  with high dose metformin. ?BM 1-2x/week ?H2O intake: 40oz per day. ? ?Chronic back pain: ? ?Wt Readings from Last 3 Encounters:  ?05/30/21 172 lb (78 kg)  ?05/23/21 174 lb 6.4 oz (79.1 kg)  ?05/20/21 177 lb 12.8 oz (80.6 kg)  ?  ?Reviewed medical, surgical, and social history today ? ?Medications: ?Outpatient Medications Prior to Visit  ?Medication Sig  ? acetaminophen (TYLENOL) 650 MG CR tablet Take 650 mg by mouth every 8 (eight) hours as needed for pain.  ? bacitracin ointment Apply 1 application topically 2 (two) times daily.  ? Coenzyme Q10 400 MG CAPS Take 400 mg by mouth daily.   ? Dapagliflozin-metFORMIN HCl ER (XIGDUO XR) 07-998 MG TB24 Take 1 tablet by mouth 2 (two) times daily. Take 1 tablet by mouth twice daily.  ? diclofenac Sodium (VOLTAREN) 1 % GEL Apply topically 4 (four) times daily.  ? Dulaglutide (TRULICITY) 3 MDE/0.8XKSOPN Inject 3 mg into the skin once a week. Inject '3mg'$  under the skin once weekly.  ? fluticasone (FLONASE) 50 MCG/ACT nasal spray Place 2 sprays into both nostrils daily. (Patient taking differently: Place 2 sprays into both nostrils daily as needed for allergies.)  ? gabapentin (NEURONTIN) 300 MG capsule Take 1 capsule (300 mg total) by mouth 3 (three) times daily.  ? Garlic 14818MG CAPS Take 1,000 mg by mouth daily.   ? glimepiride (AMARYL) 2 MG tablet TAKE 1 TABLET BY MOUTH  DAILY BEFORE BREAKFAST  ? Lancets (ONETOUCH ULTRASOFT) lancets Use as instructed once day.  E11.40  ? omega-3 acid  ethyl esters (LOVAZA) 1 g capsule Take 1 capsule (1 g total) by mouth 2 (two) times daily.  ? ondansetron (ZOFRAN) 4 MG tablet Take 1 tablet (4 mg total) by mouth as needed for nausea or vomiting.  ? ONETOUCH VERIO test strip USE AS INSTRUCTED TO CHECK  BLOOD SUGAR TWICE DAILY  ? potassium chloride SA (KLOR-CON M) 20 MEQ tablet TAKE 1 TABLET BY MOUTH  DAILY  ? [DISCONTINUED] atorvastatin (LIPITOR) 40 MG tablet Take 1 tablet (40 mg total) by mouth daily at 6 PM.  ? [DISCONTINUED]  hydrochlorothiazide (HYDRODIURIL) 25 MG tablet Take 1 tablet (25 mg total) by mouth daily.  ? [DISCONTINUED] lisinopril (ZESTRIL) 5 MG tablet TAKE 1 TABLET BY MOUTH  DAILY  ? ?No facility-administered medications prior to visit.  ? ?Reviewed past medical and social history.  ? ?ROS per HPI above ? ? ?   ?Objective:  ?BP 136/64 (BP Location: Left Arm, Patient Position: Sitting, Cuff Size: Normal)   Pulse 88   Temp (!) 97.5 ?F (36.4 ?C) (Temporal)   Ht '5\' 8"'$  (1.727 m)   Wt 172 lb (78 kg)   SpO2 98%   BMI 26.15 kg/m?  ? ?  ? ?Physical Exam ?Vitals and nursing note reviewed.  ?Cardiovascular:  ?   Rate and Rhythm: Normal rate and regular rhythm.  ?   Pulses: Normal pulses.  ?   Heart sounds: Normal heart sounds.  ?Pulmonary:  ?   Effort: Pulmonary effort is normal.  ?   Breath sounds: Normal breath sounds.  ?Abdominal:  ?   General: Bowel sounds are normal.  ?   Palpations: Abdomen is soft.  ?   Tenderness: There is no abdominal tenderness. There is no guarding.  ?Musculoskeletal:  ?   Right lower leg: No edema.  ?   Left lower leg: No edema.  ?Neurological:  ?   Mental Status: She is alert and oriented to person, place, and time.  ?Psychiatric:     ?   Mood and Affect: Mood normal.     ?   Behavior: Behavior normal.     ?   Thought Content: Thought content normal.  ?  ?No results found for any visits on 05/30/21. ?   ?Assessment & Plan:  ?  ?Problem List Items Addressed This Visit   ? ?  ? Cardiovascular and Mediastinum  ? Essential hypertension - Primary  ?  BP at goal with lisinopril, hctz and potassium ?BP Readings from Last 3 Encounters:  ?05/30/21 136/64  ?05/23/21 130/76  ?05/20/21 130/68  ? ?Repeat BMP ?Maintain current med doses ? ?  ?  ? Relevant Medications  ? lisinopril (ZESTRIL) 5 MG tablet  ? hydrochlorothiazide (HYDRODIURIL) 25 MG tablet  ? atorvastatin (LIPITOR) 40 MG tablet  ? Other Relevant Orders  ? Basic metabolic panel  ? Type II diabetes mellitus with peripheral artery disease ([place])  ?   Stable neuropathy with gabapentin '300mg'$  TID. ?DM managed by Edd Fabian, upcoming appt 06/09/2021 ?Current use of Trulicity, glimepiride, xigduo ?Use of lisinopril ?Reports resolved diarrhea with decrease in metformin dose ? ?Maintain gabapentin dose ?Collect urine microalbumin. ?  ?  ? Relevant Medications  ? lisinopril (ZESTRIL) 5 MG tablet  ? hydrochlorothiazide (HYDRODIURIL) 25 MG tablet  ? atorvastatin (LIPITOR) 40 MG tablet  ? Other Relevant Orders  ? Microalbumin / creatinine urine ratio  ?  ? Digestive  ? Chronic idiopathic constipation  ?  Chronic, waxing and waxing, BM 2x/week, associated with ABD  pain and bloating. ?No OTC med used. ? ?Advised to try Fibercon or metamucil or benefiber: 1dose 1-2x/day ?Maintain water intake at least 60oz per day. ? ? ?  ?  ?  ? Nervous and Auditory  ? Chronic bilateral low back pain with right-sided sciatica  ?  Ongoing for several years, worse in last 71month no injury ?Pain radiates to right thigh, worse with prolonged standing and walking. ?This limits her ADLs. ?S/p spinal fusion by Dr. BRolena Infantew[facility] ?Received several epidural injections in past, no improvement. ?current use of Use of tylenol '650mg'$  2tabd daily and voltaren gel, minimal improvement. ?Also take gabapentin '300mg'$  TID ?Does not want an opioid or muscle relexant due to possible side effects and risk of dependence. ?She has appt with Dr.Brooks today. ? ?  ?  ?  ? Other  ? Hyperlipidemia LDL goal <70  ?  Current use of lovaza and atorvastatin with no adverse side effects. ?Repeat lipid panel ?Maintain current medications ? ?  ?  ? Relevant Medications  ? lisinopril (ZESTRIL) 5 MG tablet  ? hydrochlorothiazide (HYDRODIURIL) 25 MG tablet  ? atorvastatin (LIPITOR) 40 MG tablet  ? Other Relevant Orders  ? Lipid panel  ? HYPOKALEMIA  ? Relevant Orders  ? Basic metabolic panel  ? ?Other Visit Diagnoses   ? ? Essential hypertension, benign      ? Relevant Medications  ? lisinopril (ZESTRIL) 5 MG tablet  ?  hydrochlorothiazide (HYDRODIURIL) 25 MG tablet  ? atorvastatin (LIPITOR) 40 MG tablet  ? ?  ? ?Return in about 6 months (around 11/30/2021) for HTN and , hyperlipidemia. ? ?  ? ?CWilfred Lacy NP ? ? ?

## 2021-05-30 NOTE — Assessment & Plan Note (Signed)
BP at goal with lisinopril, hctz and potassium ?BP Readings from Last 3 Encounters:  ?05/30/21 136/64  ?05/23/21 130/76  ?05/20/21 130/68  ? ?Repeat BMP ?Maintain current med doses ? ?

## 2021-05-30 NOTE — Assessment & Plan Note (Signed)
Current use of lovaza and atorvastatin with no adverse side effects. ?Repeat lipid panel ?Maintain current medications ? ?

## 2021-05-30 NOTE — Assessment & Plan Note (Signed)
Stable neuropathy with gabapentin '300mg'$  TID. ?DM managed by Edd Fabian, upcoming appt 06/09/2021 ?Current use of Trulicity, glimepiride, xigduo ?Use of lisinopril ?Reports resolved diarrhea with decrease in metformin dose ? ?Maintain gabapentin dose ?Collect urine microalbumin. ?

## 2021-06-04 ENCOUNTER — Other Ambulatory Visit: Payer: Self-pay | Admitting: Endocrinology

## 2021-06-04 DIAGNOSIS — E1165 Type 2 diabetes mellitus with hyperglycemia: Secondary | ICD-10-CM

## 2021-06-07 ENCOUNTER — Other Ambulatory Visit: Payer: Medicare Other

## 2021-06-09 ENCOUNTER — Ambulatory Visit: Payer: Medicare Other | Admitting: Endocrinology

## 2021-06-09 VITALS — BP 130/78 | HR 68 | Ht 68.0 in | Wt 174.0 lb

## 2021-06-09 DIAGNOSIS — I10 Essential (primary) hypertension: Secondary | ICD-10-CM

## 2021-06-09 DIAGNOSIS — E1165 Type 2 diabetes mellitus with hyperglycemia: Secondary | ICD-10-CM | POA: Diagnosis not present

## 2021-06-09 LAB — POCT GLYCOSYLATED HEMOGLOBIN (HGB A1C): Hemoglobin A1C: 7 % — AB (ref 4.0–5.6)

## 2021-06-09 MED ORDER — DAPAGLIFLOZIN PROPANEDIOL 5 MG PO TABS: 5.0000 mg | ORAL_TABLET | Freq: Every day | ORAL | 1 refills | Status: DC

## 2021-06-09 NOTE — Patient Instructions (Addendum)
Take 2 Glimeperide in am until you start Farxiga ? ?Check blood sugars on waking up 2-3 days a week ? ?Also check blood sugars about 2 hours after meals and do this after different meals by rotation ? ?Recommended blood sugar levels on waking up are 90-120 and about 2 hours after meal is 130-180 ? ? ? ? ? ?

## 2021-06-09 NOTE — Progress Notes (Signed)
? ?Patient ID: Sandra Ray, female   DOB: Mar 13, 1946, 76 y.o.   MRN: [id number]  ? ?       ? ? ?Reason for Appointment: Follow-up  ? ? ?History of Present Illness:  ?        ?Date of diagnosis of type 2 diabetes mellitus: 1996      ? ?Background history:  ? ?She had a high blood sugar on lab work which diagnosed her diabetes and she was most likely treated with metformin for a few years ?She has also taking glipizide for several years ?She was tried on Actos which was started because of significant weight gain. ?Over the last few years she has been taking either  Januvia or Tradjenta with her metformin in combination with fair control ?Her A1c has been below 8% only twice in the last 2-3 years ? ?Recent history:  ? ? ?Non-insulin hypoglycemic drugs: Amaryl 3 mg in the morning, Trulicity  3 mg weekly,  Xigduo 07/998, 1 tablet daily ? ?Current management, blood sugar patterns and problems identified: ? ?Her A1c is slightly better at 7 compared to 7.2 ?Previously was 6.5 ? ? ?She was told to increase her Amaryl to 3 mg instead of 2 mg ?However she was also supposed to let us know when she was finishing her Sandra Ray so that we could prescribe the 12/998 doses but she just got another 90-day supply of the 07/998 ?Checking blood sugars very sporadically ?She continues to have relatively high readings after meals as high as 205 ?Generally not eating a lot of carbohydrates and large portions ?Weight as below about the same ?Not able to exercise because of severe back problems ? ?Lab fasting glucose 129 ? ? ?     Side effects from medications have been: Weight gain from Actos, nausea from high dose metformin ? ? ?Glucose monitoring:  done <1 times a day         Glucometer: One Touch Verio    ? ?Blood Glucose readings from meter download and averages as follows for 30 days ? ? ?PRE-MEAL Fasting Lunch Dinner Bedtime Overall  ?Glucose range: 112-145      ?Mean/median:     149  ? ?POST-MEAL PC Breakfast PC Lunch PC Dinner   ?Glucose range: 129-205 164 148, 180  ?Mean/median:     ? ?Prior ? ? ?PRE-MEAL Fasting Lunch Dinner Bedtime Overall  ?Glucose range: 109-127    109-170  ?Mean/median:     138  ? ?POST-MEAL PC Breakfast PC Lunch PC Dinner  ?Glucose range: 157, 170 139 155, 167  ?Mean/median:     ? ? ? ?Weight history: Previous range 200-254 ? ?Wt Readings from Last 3 Encounters:  ?06/09/21 174 lb (78.9 kg)  ?05/30/21 172 lb (78 kg)  ?05/23/21 174 lb 6.4 oz (79.1 kg)  ? ? ?Glycemic control: ?  ?Lab Results  ?Component Value Date  ? HGBA1C 7.0 (A) 06/09/2021  ? HGBA1C 7.2 (H) 03/15/2021  ? HGBA1C 6.5 11/01/2020  ? ?Lab Results  ?Component Value Date  ? MICROALBUR 10.3 (H) 05/30/2021  ? [place] 42 05/30/2021  ? CREATININE 0.70 05/30/2021  ? ?Lab Results  ?Component Value Date  ? MICRALBCREAT 7.6 05/30/2021  ? ? ?Lab Results  ?Component Value Date  ? FRUCTOSAMINE 237 03/28/2019  ? FRUCTOSAMINE 263 05/14/2018  ? FRUCTOSAMINE 328 (H) 07/07/2016  ? ? ? ? ?Allergies as of 06/09/2021   ? ?   Reactions  ? Penicillins   ? Causes yeast  infections ?Did it involve swelling of the face/tongue/throat, SOB, or low BP? No ?Did it involve sudden or severe rash/hives, skin peeling, or any reaction on the inside of your mouth or nose? No ?Did you need to seek medical attention at a hospital or doctor's office? No ?When did it last happen?  2018     ?If all above answers are ?NO?, may proceed with cephalosporin use.  ? Amoxicillin Other (See Comments)  ? Pt states it causes her a yeast infection  ?Causes yeast infections ?Did it involve swelling of the face/tongue/throat, SOB, or low BP? No ?Did it involve sudden or severe rash/hives, skin peeling, or any reaction on the inside of your mouth or nose? No ?Did you need to seek medical attention at a hospital or doctor's office? No ?When did it last happen?  2018     ?If all above answers are "NO", may proceed with cephalosporin use.  ? Erythromycin   ? Passed out while taking  ? ?  ? ?  ?Medication List  ?   ? ?  ? Accurate as of June 09, 2021  4:48 PM. If you have any questions, ask your nurse or doctor.  ?  ?  ? ?  ? ?acetaminophen 650 MG CR tablet ?Commonly known as: TYLENOL ?Take 650 mg by mouth every 8 (eight) hours as needed for pain. ?  ?atorvastatin 40 MG tablet ?Commonly known as: LIPITOR ?Take 1 tablet (40 mg total) by mouth daily at 6 PM. ?  ?bacitracin ointment ?Apply 1 application topically 2 (two) times daily. ?  ?Coenzyme Q10 400 MG Caps ?Take 400 mg by mouth daily. ?  ?dapagliflozin propanediol 5 MG Tabs tablet ?Commonly known as: [place] ?Take 1 tablet (5 mg total) by mouth daily before breakfast. ?  ?diclofenac Sodium 1 % Gel ?Commonly known as: VOLTAREN ?Apply topically 4 (four) times daily. ?  ?fluticasone 50 MCG/ACT nasal spray ?Commonly known as: FLONASE ?Place 2 sprays into both nostrils daily. ?What changed:  ?when to take this ?reasons to take this ?  ?gabapentin 300 MG capsule ?Commonly known as: NEURONTIN ?Take 1 capsule (300 mg total) by mouth 3 (three) times daily. ?  ?Garlic 1610 MG Caps ?Take 1,000 mg by mouth daily. ?  ?glimepiride 2 MG tablet ?Commonly known as: AMARYL ?TAKE 1 TABLET BY MOUTH  DAILY BEFORE BREAKFAST ?  ?hydrochlorothiazide 25 MG tablet ?Commonly known as: HYDRODIURIL ?Take 1 tablet (25 mg total) by mouth daily. ?  ?lisinopril 5 MG tablet ?Commonly known as: ZESTRIL ?Take 1 tablet (5 mg total) by mouth daily. ?  ?omega-3 acid ethyl esters 1 g capsule ?Commonly known as: LOVAZA ?Take 1 capsule (1 g total) by mouth 2 (two) times daily. ?  ?ondansetron 4 MG tablet ?Commonly known as: Zofran ?Take 1 tablet (4 mg total) by mouth as needed for nausea or vomiting. ?  ?onetouch ultrasoft lancets ?Use as instructed once day.  E11.40 ?  ?OneTouch Verio test strip ?Generic drug: glucose blood ?USE AS INSTRUCTED TO CHECK  BLOOD SUGAR TWICE DAILY ?  ?potassium chloride SA 20 MEQ tablet ?Commonly known as: KLOR-CON M ?TAKE 1 TABLET BY MOUTH  DAILY ?  ?Trulicity 3 RU/0.4VW  Sopn ?Generic drug: Dulaglutide ?Inject 3 mg into the skin once a week. Inject '3mg'$  under the skin once weekly. ?  ?Xigduo XR 07-998 MG Tb24 ?Generic drug: Dapagliflozin-metFORMIN HCl ER ?Take 1 tablet by mouth 2 (two) times daily. Take 1 tablet by mouth twice daily. ?  ? ?  ? ? ?  Allergies:  ?Allergies  ?Allergen Reactions  ? Penicillins   ?  Causes yeast infections ?Did it involve swelling of the face/tongue/throat, SOB, or low BP? No ?Did it involve sudden or severe rash/hives, skin peeling, or any reaction on the inside of your mouth or nose? No ?Did you need to seek medical attention at a hospital or doctor's office? No ?When did it last happen?  2018     ?If all above answers are ?NO?, may proceed with cephalosporin use. ?  ? Amoxicillin Other (See Comments)  ?  Pt states it causes her a yeast infection  ?Causes yeast infections ?Did it involve swelling of the face/tongue/throat, SOB, or low BP? No ?Did it involve sudden or severe rash/hives, skin peeling, or any reaction on the inside of your mouth or nose? No ?Did you need to seek medical attention at a hospital or doctor's office? No ?When did it last happen?  2018     ?If all above answers are "NO", may proceed with cephalosporin use.  ? Erythromycin   ?  Passed out while taking  ? ? ?Past Medical History:  ?Diagnosis Date  ? Arthritis   ? Breast abscess 2000  ? right breast  ? Carpal tunnel syndrome   ? Complication of anesthesia   ? Degenerative disc disease   ? Diabetes mellitus type II   ? Eczema of hand   ? Hx of colonic polyps 12/02/2014  ? 2004 - 2 diminutive polyps   ? Hyperlipidemia   ? Hypertension   ? Metatarsal deformity   ? Ovarian cyst 07/2017  ? left   ? PONV (postoperative nausea and vomiting)   ? Shingles 2000  ? Tubular adenoma of colon   ? ? ?Past Surgical History:  ?Procedure Laterality Date  ? ABDOMINAL HYSTERECTOMY  1981  ? menorrhagia  ? ABSCESS DRAINAGE Right   ? breast  ? APPENDECTOMY  1966  ? done with CSxn. Prophylactic  ? BREAST  BIOPSY Left   ? carpel tunnel Right   ? CESAREAN SECTION    ? x2  ? COLONOSCOPY    ? LUMBAR LAMINECTOMY    ? 9983,3825. 2011 L4,L5,S1  ? Right rotator cuff repair 09/2015    ? ROBOTIC ASSISTED SALPINGO OOPHERECTOMY Left 2

## 2021-06-16 DIAGNOSIS — M5416 Radiculopathy, lumbar region: Secondary | ICD-10-CM | POA: Diagnosis not present

## 2021-06-27 DIAGNOSIS — M5451 Vertebrogenic low back pain: Secondary | ICD-10-CM | POA: Diagnosis not present

## 2021-06-29 DIAGNOSIS — M5451 Vertebrogenic low back pain: Secondary | ICD-10-CM | POA: Diagnosis not present

## 2021-07-05 DIAGNOSIS — M5451 Vertebrogenic low back pain: Secondary | ICD-10-CM | POA: Diagnosis not present

## 2021-07-06 DIAGNOSIS — M5451 Vertebrogenic low back pain: Secondary | ICD-10-CM | POA: Diagnosis not present

## 2021-07-14 ENCOUNTER — Telehealth: Payer: Self-pay

## 2021-07-14 NOTE — Telephone Encounter (Signed)
Patient called in states that you wanted her farxiga to be increased. She has maybe 2 weeks of her current dose please advise ?

## 2021-07-27 ENCOUNTER — Other Ambulatory Visit: Payer: Self-pay | Admitting: Endocrinology

## 2021-08-09 ENCOUNTER — Ambulatory Visit: Payer: Medicare Other

## 2021-08-11 ENCOUNTER — Telehealth: Payer: Self-pay

## 2021-08-11 NOTE — Telephone Encounter (Signed)
Patient called in about refill on xigduo for patient assistance. Informed Rx was signed and resent yesterday.

## 2021-08-12 NOTE — Telephone Encounter (Signed)
Tiffany from [facility] ([facility] with [place] [facility] [place]) needed verification on patients farxiga and xigduo she is getting through patient assistance. Informed that patient is on '5mg'$  Farxiga and that Dr Dwyane Dee increased her xigduo to '10mg'$ /1000.

## 2021-08-24 ENCOUNTER — Telehealth: Payer: Self-pay | Admitting: Nurse Practitioner

## 2021-08-24 ENCOUNTER — Other Ambulatory Visit: Payer: Self-pay | Admitting: Nurse Practitioner

## 2021-08-24 DIAGNOSIS — E1151 Type 2 diabetes mellitus with diabetic peripheral angiopathy without gangrene: Secondary | ICD-10-CM

## 2021-08-24 DIAGNOSIS — E785 Hyperlipidemia, unspecified: Secondary | ICD-10-CM

## 2021-08-24 NOTE — Telephone Encounter (Signed)
Called & spoke w/ pt. Adv that she has refills on her lisinopril prescription. She is to call her pharmacy and have them fill it for her.

## 2021-08-24 NOTE — Telephone Encounter (Signed)
Pt is requesting a refill for her Lisinpril. She uses [facility]

## 2021-08-25 ENCOUNTER — Other Ambulatory Visit: Payer: Self-pay | Admitting: Nurse Practitioner

## 2021-08-25 ENCOUNTER — Other Ambulatory Visit: Payer: Self-pay | Admitting: Endocrinology

## 2021-08-25 DIAGNOSIS — E1151 Type 2 diabetes mellitus with diabetic peripheral angiopathy without gangrene: Secondary | ICD-10-CM

## 2021-08-25 DIAGNOSIS — E785 Hyperlipidemia, unspecified: Secondary | ICD-10-CM

## 2021-08-26 ENCOUNTER — Other Ambulatory Visit: Payer: Self-pay | Admitting: Endocrinology

## 2021-08-26 ENCOUNTER — Telehealth: Payer: Self-pay | Admitting: Nurse Practitioner

## 2021-08-26 DIAGNOSIS — E1151 Type 2 diabetes mellitus with diabetic peripheral angiopathy without gangrene: Secondary | ICD-10-CM

## 2021-08-26 MED ORDER — GABAPENTIN 300 MG PO CAPS: 300.0000 mg | ORAL_CAPSULE | Freq: Three times a day (TID) | ORAL | 3 refills | Status: DC

## 2021-08-26 NOTE — Telephone Encounter (Signed)
Called & spoke w/ pharmacy, says Pt has refills on lisinopril and atorvastatin but gabapentin was expired, sent a new Rx for that. Called & updated pt on medications.

## 2021-08-26 NOTE — Telephone Encounter (Signed)
Called & spoke w/ pt, explained to her that she has 3 refills left on her Gabapentin, atorvastatin & lisinopril. She says the pharmacy wouldn't refill it unless speaking to [place], called pharmacy to confirm, they are on lunch. Will call back at 2:30pm

## 2021-08-26 NOTE — Telephone Encounter (Signed)
Pt is needing a refill on her gabapentin (NEURONTIN) 300 MG capsule [[id number]] and atorvastatin (LIPITOR) 40 MG tablet [[id number]]. These were both denied, not sure why.   She is also wanting lisinopril (ZESTRIL) 5 MG tablet [[id number]], she thought she had 2 refills left and the pharmacy is saying she has none.   [facility] ([address] [place] [facility] [place]  [address] ([place]) [place] [address]  Phone:  [phone]  Fax:  [phone]  Please advise pt at [phone]

## 2021-08-26 NOTE — Addendum Note (Signed)
Addended by: Lucillie Garfinkel on: 08/26/2021 02:45 PM   Modules accepted: Orders

## 2021-08-27 DIAGNOSIS — I700 Atherosclerosis of aorta: Secondary | ICD-10-CM | POA: Diagnosis not present

## 2021-08-27 DIAGNOSIS — E785 Hyperlipidemia, unspecified: Secondary | ICD-10-CM | POA: Diagnosis not present

## 2021-08-27 DIAGNOSIS — M961 Postlaminectomy syndrome, not elsewhere classified: Secondary | ICD-10-CM | POA: Diagnosis not present

## 2021-08-27 DIAGNOSIS — M5416 Radiculopathy, lumbar region: Secondary | ICD-10-CM | POA: Diagnosis not present

## 2021-08-29 ENCOUNTER — Other Ambulatory Visit: Payer: Self-pay | Admitting: Nurse Practitioner

## 2021-08-29 DIAGNOSIS — E785 Hyperlipidemia, unspecified: Secondary | ICD-10-CM

## 2021-08-29 DIAGNOSIS — I10 Essential (primary) hypertension: Secondary | ICD-10-CM

## 2021-08-30 ENCOUNTER — Ambulatory Visit (INDEPENDENT_AMBULATORY_CARE_PROVIDER_SITE_OTHER): Payer: Medicare Other

## 2021-08-30 DIAGNOSIS — Z0000 Encounter for general adult medical examination without abnormal findings: Secondary | ICD-10-CM

## 2021-08-30 NOTE — Progress Notes (Signed)
Subjective:   Sandra Ray is a 76 y.o. female who presents for Medicare Annual (Subsequent) preventive examination.   I connected with Sandra Ray today by telephone and verified that I am speaking with the correct person using two identifiers. Location patient: home Location provider: work Persons participating in the virtual visit: patient, provider.   I discussed the limitations, risks, security and privacy concerns of performing an evaluation and management service by telephone and the availability of in person appointments. I also discussed with the patient that there may be a patient responsible charge related to this service. The patient expressed understanding and verbally consented to this telephonic visit.    Interactive audio and video telecommunications were attempted between this provider and patient, however failed, due to patient having technical difficulties OR patient did not have access to video capability.  We continued and completed visit with audio only.    Review of Systems     Cardiac Risk Factors include: advanced age (>67mn, >>33women);diabetes mellitus;hypertension;dyslipidemia     Objective:    Today's Vitals   There is no height or weight on file to calculate BMI.     08/30/2021    1:37 PM 05/15/2021    2:19 PM 11/29/2020    3:32 PM 08/03/2020   11:20 AM 05/12/2018    4:18 PM 04/24/2018    1:55 PM 04/12/2018    9:40 AM  Advanced Directives  Does Patient Have a Medical Advance Directive? No No Yes No No No No  Type of Advance Directive   Living will;Out of facility DNR (pink MOST or yellow form)      Does patient want to make changes to medical advance directive?   Yes (MAU/Ambulatory/Procedural Areas - Information given)      Would patient like information on creating a medical advance directive? No - Patient declined   Yes (MAU/Ambulatory/Procedural Areas - Information given)  No - Patient declined No - Patient declined    Current Medications  (verified) Outpatient Encounter Medications as of 08/30/2021  Medication Sig   acetaminophen (TYLENOL) 650 MG CR tablet Take 650 mg by mouth every 8 (eight) hours as needed for pain.   atorvastatin (LIPITOR) 40 MG tablet Take 1 tablet (40 mg total) by mouth daily at 6 PM.   bacitracin ointment Apply 1 application topically 2 (two) times daily.   Coenzyme Q10 400 MG CAPS Take 400 mg by mouth daily.    Dapagliflozin-metFORMIN HCl ER (XIGDUO XR) 07-998 MG TB24 Take 1 tablet by mouth 2 (two) times daily. Take 1 tablet by mouth twice daily.   diclofenac Sodium (VOLTAREN) 1 % GEL Apply topically 4 (four) times daily.   Dulaglutide (TRULICITY) 3 MGY/1.8HUSOPN Inject 3 mg into the skin once a week. Inject '3mg'$  under the skin once weekly.   fluticasone (FLONASE) 50 MCG/ACT nasal spray Place 2 sprays into both nostrils daily. (Patient taking differently: Place 2 sprays into both nostrils daily as needed for allergies.)   gabapentin (NEURONTIN) 300 MG capsule Take 1 capsule (300 mg total) by mouth 3 (three) times daily.   Garlic 13149MG CAPS Take 1,000 mg by mouth daily.    glimepiride (AMARYL) 2 MG tablet TAKE 1 TABLET BY MOUTH  DAILY BEFORE BREAKFAST   hydrochlorothiazide (HYDRODIURIL) 25 MG tablet Take 1 tablet (25 mg total) by mouth daily.   Lancets (ONETOUCH ULTRASOFT) lancets Use as instructed once day.  E11.40   lisinopril (ZESTRIL) 5 MG tablet TAKE 1 TABLET BY MOUTH  DAILY   omega-3 acid ethyl esters (LOVAZA) 1 g capsule Take 1 capsule (1 g total) by mouth 2 (two) times daily.   ondansetron (ZOFRAN) 4 MG tablet Take 1 tablet (4 mg total) by mouth as needed for nausea or vomiting.   ONETOUCH VERIO test strip USE AS INSTRUCTED TO CHECK  BLOOD SUGAR TWICE DAILY   potassium chloride SA (KLOR-CON M) 20 MEQ tablet TAKE 1 TABLET BY MOUTH  DAILY   FARXIGA 5 MG TABS tablet TAKE 1 TABLET BY MOUTH DAILY  BEFORE BREAKFAST (Patient not taking: Reported on 08/30/2021)   No facility-administered encounter  medications on file as of 08/30/2021.    Allergies (verified) Penicillins, Amoxicillin, and Erythromycin   History: Past Medical History:  Diagnosis Date   Arthritis    Breast abscess 2000   right breast   Carpal tunnel syndrome    Complication of anesthesia    Degenerative disc disease    Diabetes mellitus type II    Eczema of hand    Hx of colonic polyps 12/02/2014   2004 - 2 diminutive polyps    Hyperlipidemia    Hypertension    Metatarsal deformity    Ovarian cyst 07/2017   left    PONV (postoperative nausea and vomiting)    Shingles 2000   Tubular adenoma of colon    Past Surgical History:  Procedure Laterality Date   ABDOMINAL HYSTERECTOMY  1981   menorrhagia   ABSCESS DRAINAGE Right    breast   APPENDECTOMY  1966   done with CSxn. Prophylactic   BREAST BIOPSY Left    carpel tunnel Right    CESAREAN SECTION     x2   COLONOSCOPY     LUMBAR LAMINECTOMY     7858,8502. 2011 L4,L5,S1   Right rotator cuff repair 09/2015     ROBOTIC ASSISTED SALPINGO OOPHERECTOMY Left 04/16/2018   Procedure: XI ROBOTIC ASSISTED  LEFT SALPINGO OOPHORECTOMY;  Surgeon: [name], MD;  Location: [facility];  Service: Gynecology;  Laterality: Left;   TUBAL LIGATION     Family History  Problem Relation Age of Onset   Hypertension Father    Diabetes Mother    Stroke Mother    Hypertension Mother    High Cholesterol Mother    Heart disease Mother    Diabetes Brother    Breast cancer Daughter 57       unsure if did genetic testing   Breast cancer Maternal Aunt    Breast cancer Sister 15   Colon polyps Brother    Social History   Socioeconomic History   Marital status: Married    Spouse name: Not on file   Number of children: 2   Years of education: Not on file   Highest education level: Not on file  Occupational History   Not on file  Tobacco Use   Smoking status: Never   Smokeless tobacco: Never  Vaping Use   Vaping Use: Never used  Substance and Sexual Activity    Alcohol use: No   Drug use: No   Sexual activity: Not Currently  Other Topics Concern   Not on file  Social History Narrative   Married 2 daughters   Not working outside home   no caffeine   12/02/2014   Social Determinants of Health   Financial Resource Strain: Low Risk  (08/30/2021)   Overall Financial Resource Strain (CARDIA)    Difficulty of Paying Living Expenses: Not hard at all  Food Insecurity: No  Food Insecurity (08/30/2021)   Hunger Vital Sign    Worried About Running Out of Food in the Last Year: Never true    Ran Out of Food in the Last Year: Never true  Transportation Needs: No Transportation Needs (08/30/2021)   PRAPARE - Hydrologist (Medical): No    Lack of Transportation (Non-Medical): No  Physical Activity: Insufficiently Active (08/30/2021)   Exercise Vital Sign    Days of Exercise per Week: 3 days    Minutes of Exercise per Session: 30 min  Stress: No Stress Concern Present (08/30/2021)   [place]    Feeling of Stress : Not at all  Social Connections: Moderately Integrated (08/30/2021)   Social Connection and Isolation Panel [NHANES]    Frequency of Communication with Friends and Family: Three times a week    Frequency of Social Gatherings with Friends and Family: Three times a week    Attends Religious Services: 1 to 4 times per year    Active Member of Clubs or Organizations: No    Attends Music therapist: Never    Marital Status: Married    Tobacco Counseling Counseling given: Not Answered   Clinical Intake:  Pre-visit preparation completed: Yes  Pain : No/denies pain     Nutritional Risks: None Diabetes: No  How often do you need to have someone help you when you read instructions, pamphlets, or other written materials from your doctor or pharmacy?: 1 - Never What is the last grade level you completed in school?:  GED  Diabetic?yes Nutrition Risk Assessment:  Has the patient had any N/V/D within the last 2 months?  No  Does the patient have any non-healing wounds?  No  Has the patient had any unintentional weight loss or weight gain?  No   Diabetes:  Is the patient diabetic?  Yes  If diabetic, was a CBG obtained today?  No  Did the patient bring in their glucometer from home?  No  How often do you monitor your CBG's? 3 x week .   Financial Strains and Diabetes Management:  Are you having any financial strains with the device, your supplies or your medication? No .  Does the patient want to be seen by Chronic Care Management for management of their diabetes?  No  Would the patient like to be referred to a Nutritionist or for Diabetic Management?  No   Diabetic Exams:  Diabetic Eye Exam: Completed 11/2020 Diabetic Foot Exam: Overdue, Pt has been advised about the importance in completing this exam. Pt is scheduled for diabetic foot exam on next office visit .   Interpreter Needed?: No  Information entered by :: L>Ivonne [place],LPN   Activities of Daily Living    08/30/2021    1:41 PM 05/30/2021   10:05 AM  In your present state of health, do you have any difficulty performing the following activities:  Hearing? 0 0  Vision? 0 0  Difficulty concentrating or making decisions? 0 0  Walking or climbing stairs? 0 0  Dressing or bathing? 0 0  Doing errands, shopping? 0 0  Preparing Food and eating ? N   Using the Toilet? N   In the past six months, have you accidently leaked urine? N   Do you have problems with loss of bowel control? N   Managing your Medications? N   Managing your Finances? N   Housekeeping or managing your Housekeeping? N  Patient Care Team: Nche, Charlene Brooke, NP as PCP - General (Internal Medicine) Sheard, Briscoe Burns, DPM (Inactive) as Consulting Physician (Podiatry) [place] Comment, My Crafton, [place] as Referring Physician (Optometry)  Indicate any recent Medical Services you may  have received from other than [facility] providers in the past year (date may be approximate).     Assessment:   This is a routine wellness examination for Kandis.  Hearing/Vision screen Vision Screening - Comments:: Annual eye exams wears glasses   Dietary issues and exercise activities discussed: Current Exercise Habits: Home exercise routine, Type of exercise: walking, Time (Minutes): 30, Frequency (Times/Week): 3, Weekly Exercise (Minutes/Week): 90, Intensity: Mild, Exercise limited by: orthopedic condition(s)   Goals Addressed             This Visit's Progress    Patient Stated   On track    Maintain current health       Depression Screen    08/30/2021    1:41 PM 08/30/2021    1:37 PM 08/30/2021    1:35 PM 05/30/2021   10:59 AM 08/03/2020   11:22 AM 04/03/2018   10:07 AM 03/11/2018   10:54 AM  PHQ 2/9 Scores  PHQ - 2 Score 0 0 0 0 0 0 0  PHQ- 9 Score    1       Fall Risk    08/30/2021    1:37 PM 05/30/2021   10:05 AM 08/03/2020   11:22 AM 04/03/2018   10:07 AM 03/11/2018   10:53 AM  Fall Risk   Falls in the past year? 0 0 0 0 0  Number falls in past yr: 0 0 0    Injury with Fall? 0 0 0    Risk for fall due to :  No Fall Risks     Follow up Falls evaluation completed;Education provided Falls evaluation completed Falls prevention discussed      FALL RISK PREVENTION PERTAINING TO THE HOME:  Any stairs in or around the home? Yes  If so, are there any without handrails? No  Home free of loose throw rugs in walkways, pet beds, electrical cords, etc? Yes  Adequate lighting in your home to reduce risk of falls? Yes   ASSISTIVE DEVICES UTILIZED TO PREVENT FALLS:  Life alert? No  Use of a cane, walker or w/c? No  Grab bars in the bathroom? Yes  Shower chair or bench in shower? Yes  Elevated toilet seat or a handicapped toilet? Yes     Cognitive Function: Normal cognitive status assessed by telephone conversation  by this Nurse Health Advisor. No abnormalities found.       03/28/2016    1:27 PM  MMSE - Mini Mental State Exam  Orientation to time 5  Orientation to Place 5  Registration 3  Attention/ Calculation 5  Recall 3  Language- name 2 objects 2  Language- repeat 1  Language- follow 3 step command 3  Language- read & follow direction 1  Write a sentence 1  Copy design 1  Total score 30        08/03/2020   11:32 AM  6CIT Screen  What Year? 0 points  What month? 0 points  What time? 0 points  Count back from 20 0 points  Months in reverse 2 points  Repeat phrase 2 points  Total Score 4 points    Immunizations Immunization History  Administered Date(s) Administered   Fluad Quad(high Dose 65+) 11/29/2020   Influenza Split 12/19/2011   Influenza  Whole 12/17/2008, 12/22/2009   Influenza, High Dose Seasonal PF 12/25/2014, 11/18/2015, 11/23/2016, 12/14/2017, 10/31/2018   Influenza,inj,Quad PF,6+ Mos 12/18/2012, 12/23/2013   Influenza,inj,quad, With Preservative 12/11/2016   Influenza-Unspecified 11/24/2019   PFIZER(Purple Top)SARS-COV-2 Vaccination 04/18/2019, 05/09/2019, 12/06/2019, 06/14/2020   Pneumococcal Conjugate-13 12/23/2013, 08/30/2015   Pneumococcal Polysaccharide-23 06/19/2011, 01/16/2017   Td 02/23/2004   Tdap 01/30/2015   Zoster, Live 03/28/2010     TDAP status: Up to date  Flu Vaccine status: Up to date  Pneumococcal vaccine status: Up to date  Covid-19 vaccine status: Completed vaccines  Qualifies for Shingles Vaccine? Yes   Zostavax completed No   Shingrix Completed?: No.    Education has been provided regarding the importance of this vaccine. Patient has been advised to call insurance company to determine out of pocket expense if they have not yet received this vaccine. Advised may also receive vaccine at local pharmacy or Health Dept. Verbalized acceptance and understanding.  Screening Tests Health Maintenance  Topic Date Due   Zoster Vaccines- Shingrix (1 of 2) Never done   COVID-19 Vaccine (5 -  Pfizer series) 08/09/2020   INFLUENZA VACCINE  10/11/2021   FOOT EXAM  11/29/2021   HEMOGLOBIN A1C  12/10/2021   OPHTHALMOLOGY EXAM  02/24/2022   TETANUS/TDAP  01/29/2025   Pneumonia Vaccine 41+ Years old  Completed   DEXA SCAN  Completed   Hepatitis C Screening  Completed   HPV VACCINES  Aged Out   COLONOSCOPY (Pts 45-69yr Insurance coverage will need to be confirmed)  Discontinued    Health Maintenance  Health Maintenance Due  Topic Date Due   Zoster Vaccines- Shingrix (1 of 2) Never done   COVID-19 Vaccine (5 - Pfizer series) 08/09/2020    Colorectal cancer screening: No longer required.   Mammogram status: No longer required due to age .  Bone Density status: Completed 01/21/2021. Results reflect: Bone density results: OSTEOPENIA. Repeat every 5 years.  Lung Cancer Screening: (Low Dose CT Chest recommended if Age 76-80years, 30 pack-year currently smoking OR have quit w/in 15years.) does not qualify.   Lung Cancer Screening Referral: n/a  Additional Screening:  Hepatitis C Screening: does not qualify;  Vision Screening: Recommended annual ophthalmology exams for early detection of glaucoma and other disorders of the eye. Is the patient up to date with their annual eye exam?  Yes  Who is the provider or what is the name of the office in which the patient attends annual eye exams? Dr.Smith  If pt is not established with a provider, would they like to be referred to a provider to establish care? No .   Dental Screening: Recommended annual dental exams for proper oral hygiene  Community Resource Referral / Chronic Care Management: CRR required this visit?  No   CCM required this visit?  No      Plan:     I have personally reviewed and noted the following in the patient's chart:   Medical and social history Use of alcohol, tobacco or illicit drugs  Current medications and supplements including opioid prescriptions.  Functional ability and status Nutritional  status Physical activity Advanced directives List of other physicians Hospitalizations, surgeries, and ER visits in previous 12 months Vitals Screenings to include cognitive, depression, and falls Referrals and appointments  In addition, I have reviewed and discussed with patient certain preventive protocols, quality metrics, and best practice recommendations. A written personalized care plan for preventive services as well as general preventive health recommendations were provided to patient.  Pina Sirianni, LPN   [id number]   Nurse Notes: none

## 2021-08-30 NOTE — Telephone Encounter (Signed)
Chart Supports Rx Last OV: 05/2021 Next OV: 08/2021

## 2021-08-30 NOTE — Patient Instructions (Signed)
Ms. Sandra Ray , Thank you for taking time to come for your Medicare Wellness Visit. I appreciate your ongoing commitment to your health goals. Please review the following plan we discussed and let me know if I can assist you in the future.   Screening recommendations/referrals: Colonoscopy: no longer required  Mammogram: no longer required  Bone Density: 01/21/2021 Recommended yearly ophthalmology/optometry visit for glaucoma screening and checkup Recommended yearly dental visit for hygiene and checkup  Vaccinations: Influenza vaccine: completed  Pneumococcal vaccine: completed  Tdap vaccine: 01/30/2015 Shingles vaccine: will consider     Advanced directives: none   Conditions/risks identified: none   Next appointment: none    Preventive Care 29 Years and Older, Female Preventive care refers to lifestyle choices and visits with your health care provider that can promote health and wellness. What does preventive care include? A yearly physical exam. This is also called an annual well check. Dental exams once or twice a year. Routine eye exams. Ask your health care provider how often you should have your eyes checked. Personal lifestyle choices, including: Daily care of your teeth and gums. Regular physical activity. Eating a healthy diet. Avoiding tobacco and drug use. Limiting alcohol use. Practicing safe sex. Taking low-dose aspirin every day. Taking vitamin and mineral supplements as recommended by your health care provider. What happens during an annual well check? The services and screenings done by your health care provider during your annual well check will depend on your age, overall health, lifestyle risk factors, and family history of disease. Counseling  Your health care provider may ask you questions about your: Alcohol use. Tobacco use. Drug use. Emotional well-being. Home and relationship well-being. Sexual activity. Eating habits. History of falls. Memory  and ability to understand (cognition). Work and work Statistician. Reproductive health. Screening  You may have the following tests or measurements: Height, weight, and BMI. Blood pressure. Lipid and cholesterol levels. These may be checked every 5 years, or more frequently if you are over 59 years old. Skin check. Lung cancer screening. You may have this screening every year starting at age 34 if you have a 30-pack-year history of smoking and currently smoke or have quit within the past 15 years. Fecal occult blood test (FOBT) of the stool. You may have this test every year starting at age 53. Flexible sigmoidoscopy or colonoscopy. You may have a sigmoidoscopy every 5 years or a colonoscopy every 10 years starting at age 32. Hepatitis C blood test. Hepatitis B blood test. Sexually transmitted disease (STD) testing. Diabetes screening. This is done by checking your blood sugar (glucose) after you have not eaten for a while (fasting). You may have this done every 1-3 years. Bone density scan. This is done to screen for osteoporosis. You may have this done starting at age 38. Mammogram. This may be done every 1-2 years. Talk to your health care provider about how often you should have regular mammograms. Talk with your health care provider about your test results, treatment options, and if necessary, the need for more tests. Vaccines  Your health care provider may recommend certain vaccines, such as: Influenza vaccine. This is recommended every year. Tetanus, diphtheria, and acellular pertussis (Tdap, Td) vaccine. You may need a Td booster every 10 years. Zoster vaccine. You may need this after age 37. Pneumococcal 13-valent conjugate (PCV13) vaccine. One dose is recommended after age 18. Pneumococcal polysaccharide (PPSV23) vaccine. One dose is recommended after age 59. Talk to your health care provider about which screenings and  vaccines you need and how often you need them. This  information is not intended to replace advice given to you by your health care provider. Make sure you discuss any questions you have with your health care provider. Document Released: 03/26/2015 Document Revised: 11/17/2015 Document Reviewed: 12/29/2014 Elsevier Interactive Patient Education  2017 [employer] Prevention in the Home Falls can cause injuries. They can happen to people of all ages. There are many things you can do to make your home safe and to help prevent falls. What can I do on the outside of my home? Regularly fix the edges of walkways and driveways and fix any cracks. Remove anything that might make you trip as you walk through a door, such as a raised step or threshold. Trim any bushes or trees on the path to your home. Use bright outdoor lighting. Clear any walking paths of anything that might make someone trip, such as rocks or tools. Regularly check to see if handrails are loose or broken. Make sure that both sides of any steps have handrails. Any raised decks and porches should have guardrails on the edges. Have any leaves, snow, or ice cleared regularly. Use sand or salt on walking paths during winter. Clean up any spills in your garage right away. This includes oil or grease spills. What can I do in the bathroom? Use night lights. Install grab bars by the toilet and in the tub and shower. Do not use towel bars as grab bars. Use non-skid mats or decals in the tub or shower. If you need to sit down in the shower, use a plastic, non-slip stool. Keep the floor dry. Clean up any water that spills on the floor as soon as it happens. Remove soap buildup in the tub or shower regularly. Attach bath mats securely with double-sided non-slip rug tape. Do not have throw rugs and other things on the floor that can make you trip. What can I do in the bedroom? Use night lights. Make sure that you have a light by your bed that is easy to reach. Do not use any sheets or  blankets that are too big for your bed. They should not hang down onto the floor. Have a firm chair that has side arms. You can use this for support while you get dressed. Do not have throw rugs and other things on the floor that can make you trip. What can I do in the kitchen? Clean up any spills right away. Avoid walking on wet floors. Keep items that you use a lot in easy-to-reach places. If you need to reach something above you, use a strong step stool that has a grab bar. Keep electrical cords out of the way. Do not use floor polish or wax that makes floors slippery. If you must use wax, use non-skid floor wax. Do not have throw rugs and other things on the floor that can make you trip. What can I do with my stairs? Do not leave any items on the stairs. Make sure that there are handrails on both sides of the stairs and use them. Fix handrails that are broken or loose. Make sure that handrails are as long as the stairways. Check any carpeting to make sure that it is firmly attached to the stairs. Fix any carpet that is loose or worn. Avoid having throw rugs at the top or bottom of the stairs. If you do have throw rugs, attach them to the floor with carpet tape. Make  sure that you have a light switch at the top of the stairs and the bottom of the stairs. If you do not have them, ask someone to add them for you. What else can I do to help prevent falls? Wear shoes that: Do not have high heels. Have rubber bottoms. Are comfortable and fit you well. Are closed at the toe. Do not wear sandals. If you use a stepladder: Make sure that it is fully opened. Do not climb a closed stepladder. Make sure that both sides of the stepladder are locked into place. Ask someone to hold it for you, if possible. Clearly mark and make sure that you can see: Any grab bars or handrails. First and last steps. Where the edge of each step is. Use tools that help you move around (mobility aids) if they are  needed. These include: Canes. Walkers. Scooters. Crutches. Turn on the lights when you go into a dark area. Replace any light bulbs as soon as they burn out. Set up your furniture so you have a clear path. Avoid moving your furniture around. If any of your floors are uneven, fix them. If there are any pets around you, be aware of where they are. Review your medicines with your doctor. Some medicines can make you feel dizzy. This can increase your chance of falling. Ask your doctor what other things that you can do to help prevent falls. This information is not intended to replace advice given to you by your health care provider. Make sure you discuss any questions you have with your health care provider. Document Released: 12/24/2008 Document Revised: 08/05/2015 Document Reviewed: 04/03/2014 Elsevier Interactive Patient Education  2017 [employer].

## 2021-09-12 ENCOUNTER — Other Ambulatory Visit: Payer: Self-pay | Admitting: Nurse Practitioner

## 2021-09-12 DIAGNOSIS — I10 Essential (primary) hypertension: Secondary | ICD-10-CM

## 2021-09-14 NOTE — Telephone Encounter (Signed)
Chart supports Rx Last OV: 05/2021 Next OV: 11/2021

## 2021-09-20 ENCOUNTER — Other Ambulatory Visit (INDEPENDENT_AMBULATORY_CARE_PROVIDER_SITE_OTHER): Payer: Medicare Other

## 2021-09-20 DIAGNOSIS — E1165 Type 2 diabetes mellitus with hyperglycemia: Secondary | ICD-10-CM

## 2021-09-20 LAB — HEMOGLOBIN A1C: Hgb A1c MFr Bld: 7.5 % — ABNORMAL HIGH (ref 4.6–6.5)

## 2021-09-20 LAB — BASIC METABOLIC PANEL
BUN: 22 mg/dL (ref 6–23)
CO2: 29 mEq/L (ref 19–32)
Calcium: 10.2 mg/dL (ref 8.4–10.5)
Chloride: 100 mEq/L (ref 96–112)
Creatinine, Ser: 0.67 mg/dL (ref 0.40–1.20)
GFR: 85.06 mL/min (ref >60.00)
Glucose, Bld: 125 mg/dL — ABNORMAL HIGH (ref 70–99)
Potassium: 3.9 mEq/L (ref 3.5–5.1)
Sodium: 138 mEq/L (ref 135–145)

## 2021-09-20 LAB — GLUCOSE, RANDOM: Glucose, Bld: 125 mg/dL — ABNORMAL HIGH (ref 70–99)

## 2021-09-22 ENCOUNTER — Ambulatory Visit: Payer: Medicare Other | Admitting: Endocrinology

## 2021-09-22 ENCOUNTER — Encounter: Payer: Self-pay | Admitting: Endocrinology

## 2021-09-22 VITALS — BP 158/68 | HR 69 | Ht 68.5 in | Wt 176.6 lb

## 2021-09-22 DIAGNOSIS — E1165 Type 2 diabetes mellitus with hyperglycemia: Secondary | ICD-10-CM

## 2021-09-22 NOTE — Progress Notes (Signed)
Patient ID: Sandra Ray, female   DOB: October 31, 1945, 76 y.o.   MRN: [id number]            Reason for Appointment: Follow-up    History of Present Illness:          Date of diagnosis of type 2 diabetes mellitus: 1996       Background history:   She had a high blood sugar on lab work which diagnosed her diabetes and she was most likely treated with metformin for a few years She has also taking glipizide for several years She was tried on Actos which was started because of significant weight gain. Over the last few years she has been taking either  Januvia or Tradjenta with her metformin in combination with fair control Her A1c has been below 8% only twice in the last 2-3 years  Recent history:    Non-insulin hypoglycemic drugs: Amaryl 4 mg in the morning, Trulicity  3 mg weekly,  Xigduo 12/998, 1 tablet daily  Current management, blood sugar patterns and problems identified:  Her A1c is slightly higher at 7.5 compared to 7 Previously as low as 6.5   She was able to increase the dose of her Xigduo with higher amount of Farxiga  However blood sugars are relatively higher especially midday and not clear why  Breakfast is usually peanut butter with banana and a slice of toast Weight as below about the same Not able to exercise because of severe back problems She has a few readings after dinner which are not high but has high readings midday and afternoon Lab fasting glucose 125        Side effects from medications have been: Weight gain from Actos, nausea from high dose metformin   Glucose monitoring:  done <1 times a day         Glucometer: One Touch Verio     Blood Glucose readings from meter download and averages as follows for 30 days   PRE-MEAL Fasting Lunch Dinner Bedtime Overall  Glucose range: 107-157 165, 199   107-199  Mean/median: 126    140   POST-MEAL PC Breakfast PC Lunch PC Dinner  Glucose range: 147 171, 193 122-141  Mean/median:        Previously:  PRE-MEAL Fasting Lunch Dinner Bedtime Overall  Glucose range: 112-145      Mean/median:     149   POST-MEAL PC Breakfast PC Lunch PC Dinner  Glucose range: 129-205 164 148, 180  Mean/median:         Weight history: Previous range 200-254  Wt Readings from Last 3 Encounters:  09/22/21 176 lb 9.6 oz (80.1 kg)  06/09/21 174 lb (78.9 kg)  05/30/21 172 lb (78 kg)    Glycemic control:   Lab Results  Component Value Date   HGBA1C 7.5 (H) 09/20/2021   HGBA1C 7.0 (A) 06/09/2021   HGBA1C 7.2 (H) 03/15/2021   Lab Results  Component Value Date   MICROALBUR 10.3 (H) 05/30/2021   LDLCALC 42 05/30/2021   CREATININE 0.67 09/20/2021   Lab Results  Component Value Date   MICRALBCREAT 7.6 05/30/2021    Lab Results  Component Value Date   FRUCTOSAMINE 237 03/28/2019   FRUCTOSAMINE 263 05/14/2018   FRUCTOSAMINE 328 (H) 07/07/2016      Allergies as of 09/22/2021       Reactions   Penicillins    Causes yeast infections Did it involve swelling of the face/tongue/throat, SOB, or low BP? No  Did it involve sudden or severe rash/hives, skin peeling, or any reaction on the inside of your mouth or nose? No Did you need to seek medical attention at a hospital or doctor's office? No When did it last happen?  2018     If all above answers are "NO", may proceed with cephalosporin use.   Amoxicillin Other (See Comments)   Pt states it causes her a yeast infection  Causes yeast infections Did it involve swelling of the face/tongue/throat, SOB, or low BP? No Did it involve sudden or severe rash/hives, skin peeling, or any reaction on the inside of your mouth or nose? No Did you need to seek medical attention at a hospital or doctor's office? No When did it last happen?  2018     If all above answers are "NO", may proceed with cephalosporin use.   Erythromycin    Passed out while taking        Medication List        Accurate as of September 22, 2021 11:59 PM. If  you have any questions, ask your nurse or doctor.          acetaminophen 650 MG CR tablet Commonly known as: TYLENOL Take 650 mg by mouth every 8 (eight) hours as needed for pain.   atorvastatin 40 MG tablet Commonly known as: LIPITOR Take 1 tablet (40 mg total) by mouth daily at 6 PM.   bacitracin ointment Apply 1 application topically 2 (two) times daily.   Coenzyme Q10 400 MG Caps Take 400 mg by mouth daily.   diclofenac Sodium 1 % Gel Commonly known as: VOLTAREN Apply topically 4 (four) times daily.   Farxiga 5 MG Tabs tablet Generic drug: dapagliflozin propanediol TAKE 1 TABLET BY MOUTH DAILY  BEFORE BREAKFAST   fluticasone 50 MCG/ACT nasal spray Commonly known as: FLONASE Place 2 sprays into both nostrils daily. What changed:  when to take this reasons to take this   gabapentin 300 MG capsule Commonly known as: NEURONTIN Take 1 capsule (300 mg total) by mouth 3 (three) times daily.   Garlic 9323 MG Caps Take 1,000 mg by mouth daily.   glimepiride 2 MG tablet Commonly known as: AMARYL TAKE 1 TABLET BY MOUTH  DAILY BEFORE BREAKFAST   hydrochlorothiazide 25 MG tablet Commonly known as: HYDRODIURIL TAKE 1 TABLET BY MOUTH  DAILY   lisinopril 5 MG tablet Commonly known as: ZESTRIL TAKE 1 TABLET BY MOUTH  DAILY   omega-3 acid ethyl esters 1 g capsule Commonly known as: LOVAZA Take 1 capsule (1 g total) by mouth 2 (two) times daily.   ondansetron 4 MG tablet Commonly known as: Zofran Take 1 tablet (4 mg total) by mouth as needed for nausea or vomiting.   onetouch ultrasoft lancets Use as instructed once day.  E11.40   OneTouch Verio test strip Generic drug: glucose blood USE AS INSTRUCTED TO CHECK  BLOOD SUGAR TWICE DAILY   potassium chloride SA 20 MEQ tablet Commonly known as: KLOR-CON M TAKE 1 TABLET BY MOUTH  DAILY   Trulicity 3 FT/7.3UK Sopn Generic drug: Dulaglutide Inject 3 mg into the skin once a week. Inject '3mg'$  under the skin once  weekly.   Xigduo XR 12-998 MG Tb24 Generic drug: Dapagliflozin-metFORMIN HCl ER Take by mouth.   Xigduo XR 07-998 MG Tb24 Generic drug: Dapagliflozin-metFORMIN HCl ER Take 1 tablet by mouth 2 (two) times daily. Take 1 tablet by mouth twice daily.        Allergies:  Allergies  Allergen Reactions   Penicillins     Causes yeast infections Did it involve swelling of the face/tongue/throat, SOB, or low BP? No Did it involve sudden or severe rash/hives, skin peeling, or any reaction on the inside of your mouth or nose? No Did you need to seek medical attention at a hospital or doctor's office? No When did it last happen?  2018     If all above answers are "NO", may proceed with cephalosporin use.    Amoxicillin Other (See Comments)    Pt states it causes her a yeast infection  Causes yeast infections Did it involve swelling of the face/tongue/throat, SOB, or low BP? No Did it involve sudden or severe rash/hives, skin peeling, or any reaction on the inside of your mouth or nose? No Did you need to seek medical attention at a hospital or doctor's office? No When did it last happen?  2018     If all above answers are "NO", may proceed with cephalosporin use.   Erythromycin     Passed out while taking    Past Medical History:  Diagnosis Date   Arthritis    Breast abscess 2000   right breast   Carpal tunnel syndrome    Complication of anesthesia    Degenerative disc disease    Diabetes mellitus type II    Eczema of hand    Hx of colonic polyps 12/02/2014   2004 - 2 diminutive polyps    Hyperlipidemia    Hypertension    Metatarsal deformity    Ovarian cyst 07/2017   left    PONV (postoperative nausea and vomiting)    Shingles 2000   Tubular adenoma of colon     Past Surgical History:  Procedure Laterality Date   ABDOMINAL HYSTERECTOMY  1981   menorrhagia   ABSCESS DRAINAGE Right    breast   APPENDECTOMY  1966   done with CSxn. Prophylactic   BREAST BIOPSY Left     carpel tunnel Right    CESAREAN SECTION     x2   COLONOSCOPY     LUMBAR LAMINECTOMY     [id number]. 2011 L4,L5,S1   Right rotator cuff repair 09/2015     ROBOTIC ASSISTED SALPINGO OOPHERECTOMY Left 04/16/2018   Procedure: XI ROBOTIC ASSISTED  LEFT SALPINGO OOPHORECTOMY;  Surgeon: [name], MD;  Location: [facility];  Service: Gynecology;  Laterality: Left;   TUBAL LIGATION      Family History  Problem Relation Age of Onset   Hypertension Father    Diabetes Mother    Stroke Mother    Hypertension Mother    High Cholesterol Mother    Heart disease Mother    Diabetes Brother    Breast cancer Daughter 76       unsure if did genetic testing   Breast cancer Maternal Aunt    Breast cancer Sister 15   Colon polyps Brother     Social History:  reports that she has never smoked. She has never used smokeless tobacco. She reports that she does not drink alcohol and does not use drugs.   Review of Systems   Lipid history: Has had high triglycerides and LDL, LDL below 100 on atorvastatin 40 mg prescribed by her PCP  Also has hypertriglyceridemia treated with Lovaza, triglycerides as follows   Lab Results  Component Value Date   CHOL 127 05/30/2021   CHOL 128 11/01/2020   CHOL 117 02/02/2020   Lab Results  Component  Value Date   HDL 52.50 05/30/2021   HDL 45.00 11/01/2020   HDL 48.00 02/02/2020   Lab Results  Component Value Date   LDLCALC 42 05/30/2021   LDLCALC 53 11/01/2020   LDLCALC 45 02/02/2020   Lab Results  Component Value Date   TRIG 160.0 (H) 05/30/2021   TRIG 153.0 (H) 11/01/2020   TRIG 120.0 02/02/2020   Lab Results  Component Value Date   CHOLHDL 2 05/30/2021   CHOLHDL 3 11/01/2020   CHOLHDL 2 02/02/2020   Lab Results  Component Value Date   LDLDIRECT 69.0 08/19/2018   LDLDIRECT 80.0 02/06/2018   LDLDIRECT 91.0 08/22/2016            Hypertension: controlled with 5 mg Lisinopril and 25 mg HCTZ Also on Farxiga Blood pressure higher today  but she is anxious and in pain  BP Readings from Last 3 Encounters:  09/22/21 (!) 158/68  06/09/21 130/78  05/30/21 136/64   Lab Results  Component Value Date   K 3.9 09/20/2021    Most recent eye exam was 12/22  She has longstanding neuropathy, 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 on the left side, taking gabapentin 300 mg Last foot exam 9/22   LABS:  Lab on 09/20/2021  Component Date Value Ref Range Status   Sodium 09/20/2021 138  135 - 145 mEq/L Final   Potassium 09/20/2021 3.9  3.5 - 5.1 mEq/L Final   Chloride 09/20/2021 100  96 - 112 mEq/L Final   CO2 09/20/2021 29  19 - 32 mEq/L Final   Glucose, Bld 09/20/2021 125 (H)  70 - 99 mg/dL Final   BUN 09/20/2021 22  6 - 23 mg/dL Final   Creatinine, Ser 09/20/2021 0.67  0.40 - 1.20 mg/dL Final   GFR 09/20/2021 85.06  >60.00 mL/min Final   Calculated using the CKD-EPI Creatinine Equation (2021)   Calcium 09/20/2021 10.2  8.4 - 10.5 mg/dL Final   Glucose, Bld 09/20/2021 125 (H)  70 - 99 mg/dL Final   Hgb A1c MFr Bld 09/20/2021 7.5 (H)  4.6 - 6.5 % Final   Glycemic Control Guidelines for People with Diabetes:Non Diabetic:  <6%Goal of Therapy: <7%Additional Action Suggested:  >8%     Physical Examination:  BP (!) 158/68   Pulse 69   Ht 5' 8.5" (1.74 m)   Wt 176 lb 9.6 oz (80.1 kg)   SpO2 96%   BMI 26.46 kg/m     ASSESSMENT:  Diabetes type 2, non-insulin-dependent  See history of present illness for detailed discussion of current diabetes management, blood sugar patterns and problems identified   Her A1c is still slightly high at 7.5  She is on a 4 drug regimen including Trulicity 3 mg, Xigduo and 4 mg Amaryl  Blood sugars are not as well controlled even with increasing the [place] and Amaryl Not clear if this is related to stress from her pain Diet has been fairly good    PLAN:   She will increase Amaryl 6 mg in the morning  She likely needs to be on 4.5 mg Trulicity but will need to start ordering this from her patient  assistance program when her current supply is over She will let us know if she has any difficulties with control To check more readings after meals and in the morning   Patient Instructions  Take 3 of '2mg'$  Glimeperide in am daily        Elayne Snare 09/24/2021, 5:34 PM   Note: This office note  was prepared with Estate agent. Any transcriptional errors that result from this process are unintentional.

## 2021-09-22 NOTE — Patient Instructions (Signed)
Take 3 of '2mg'$  Glimeperide in am daily

## 2021-10-07 ENCOUNTER — Telehealth: Payer: Self-pay

## 2021-10-07 NOTE — Telephone Encounter (Signed)
Dr Dwyane Dee increase Trulicity to 4.'5mg'$ . Patient is already on patient assistance 3 mg. Rx on providers desk to sign and send back to [place].

## 2021-10-13 ENCOUNTER — Other Ambulatory Visit: Payer: Self-pay | Admitting: Nurse Practitioner

## 2021-10-13 DIAGNOSIS — E876 Hypokalemia: Secondary | ICD-10-CM

## 2021-10-14 NOTE — Telephone Encounter (Signed)
Chart supports Rx Last OV: 05/2021 Next OV: 11/2021

## 2021-10-21 DIAGNOSIS — G894 Chronic pain syndrome: Secondary | ICD-10-CM | POA: Diagnosis not present

## 2021-10-21 DIAGNOSIS — M5416 Radiculopathy, lumbar region: Secondary | ICD-10-CM | POA: Diagnosis not present

## 2021-11-03 ENCOUNTER — Other Ambulatory Visit: Payer: Self-pay | Admitting: Nurse Practitioner

## 2021-11-03 DIAGNOSIS — E785 Hyperlipidemia, unspecified: Secondary | ICD-10-CM

## 2021-11-03 DIAGNOSIS — E1151 Type 2 diabetes mellitus with diabetic peripheral angiopathy without gangrene: Secondary | ICD-10-CM

## 2021-11-04 DIAGNOSIS — M546 Pain in thoracic spine: Secondary | ICD-10-CM | POA: Diagnosis not present

## 2021-11-04 NOTE — Telephone Encounter (Signed)
Chart supports Rx Last OV: 05/2021 Next OV: 11/2021

## 2021-11-18 ENCOUNTER — Telehealth: Payer: Self-pay | Admitting: Nurse Practitioner

## 2021-11-18 DIAGNOSIS — M961 Postlaminectomy syndrome, not elsewhere classified: Secondary | ICD-10-CM | POA: Diagnosis not present

## 2021-11-18 NOTE — Telephone Encounter (Signed)
Pt has brought up paperwork from [place] for surgical clearance that she is needing filled out. I have placed in Charlotte's folder up front. She would like to be called to pick up when completed. Pt's  # [phone]

## 2021-11-18 NOTE — Telephone Encounter (Signed)
Forms received, please allow 7-10 business days to complete  

## 2021-11-21 NOTE — Telephone Encounter (Signed)
What is her BP readings in last 1week? Need these readings to complete preop form.

## 2021-11-21 NOTE — Telephone Encounter (Signed)
Called & spoke w/ pt's sister, left a message with her advising pt to call us back with her BP readings within the past week so we can complete her forms.

## 2021-11-22 ENCOUNTER — Ambulatory Visit (HOSPITAL_COMMUNITY): Payer: Self-pay | Admitting: Orthopedic Surgery

## 2021-11-22 MED ORDER — VANCOMYCIN HCL 1500 MG/300ML IV SOLN
1500.0000 mg | Freq: Once | INTRAVENOUS | Status: DC
  Filled 2021-11-22 (×2): qty 300

## 2021-11-25 ENCOUNTER — Telehealth: Payer: Self-pay | Admitting: Nurse Practitioner

## 2021-11-25 NOTE — Telephone Encounter (Signed)
error 

## 2021-11-25 NOTE — Telephone Encounter (Signed)
Pt has brought up her bp reading's, so her form to be filled out.

## 2021-11-28 NOTE — Telephone Encounter (Signed)
BP readings received, please allow 7-10 business days to complete.

## 2021-11-30 ENCOUNTER — Ambulatory Visit (INDEPENDENT_AMBULATORY_CARE_PROVIDER_SITE_OTHER): Payer: Medicare Other | Admitting: Nurse Practitioner

## 2021-11-30 ENCOUNTER — Encounter: Payer: Self-pay | Admitting: Nurse Practitioner

## 2021-11-30 VITALS — BP 128/80 | HR 75 | Temp 97.8°F | Ht 68.5 in | Wt 176.0 lb

## 2021-11-30 DIAGNOSIS — Z23 Encounter for immunization: Secondary | ICD-10-CM | POA: Diagnosis not present

## 2021-11-30 DIAGNOSIS — E785 Hyperlipidemia, unspecified: Secondary | ICD-10-CM

## 2021-11-30 DIAGNOSIS — I10 Essential (primary) hypertension: Secondary | ICD-10-CM | POA: Diagnosis not present

## 2021-11-30 LAB — LIPID PANEL
Cholesterol: 151 mg/dL (ref 0–200)
HDL: 50.3 mg/dL (ref >39.00)
NonHDL: 100.2
Total CHOL/HDL Ratio: 3
Triglycerides: 229 mg/dL — ABNORMAL HIGH (ref 0.0–149.0)
VLDL: 45.8 mg/dL — ABNORMAL HIGH (ref 0.0–40.0)

## 2021-11-30 LAB — LDL CHOLESTEROL, DIRECT: Direct LDL: 89 mg/dL

## 2021-11-30 NOTE — Progress Notes (Deleted)
[id number]

## 2021-11-30 NOTE — Patient Instructions (Signed)
Go to lab

## 2021-11-30 NOTE — Assessment & Plan Note (Signed)
BP at goal BP Readings from Last 3 Encounters:  11/30/21 128/80  09/22/21 (!) 158/68  06/09/21 130/78

## 2021-11-30 NOTE — Pre-Procedure Instructions (Signed)
Surgical Instructions    Your procedure is scheduled on December 08, 2021.  Report to [facility] Main Entrance "A" at 10:00 A.M., then check in with the Admitting office.  Call this number if you have problems the morning of surgery:  [phone]   If you have any questions prior to your surgery date call [phone]: Open Monday-Friday 8am-4pm    Remember:  Do not eat or drink after midnight the night before your surgery      Take these medicines the morning of surgery with A SIP OF WATER:  atorvastatin (LIPITOR)  gabapentin (NEURONTIN)     Take these medicines the morning of surgery with a sip of water AS NEEDED:  acetaminophen (TYLENOL)  fluticasone (FLONASE)  ondansetron (ZOFRAN)   As of today, STOP taking any Aspirin (unless otherwise instructed by your surgeon) Aleve, Naproxen, Ibuprofen, Motrin, Advil, Goody's, BC's, all herbal medications, fish oil, and all vitamins. This includes your medication: diclofenac Sodium (VOLTAREN)     WHAT DO I DO ABOUT MY DIABETES MEDICATION?   STOP taking Dapagliflozin-metFORMIN HCl ER (XIGDUO XR) three days prior to surgery.  DO NOT TAKE weekly dose of Dulaglutide (TRULICITY) one week prior to surgery.  DO NOT take evening dose of glimepiride (AMARYL) the night before surgery.   HOW TO MANAGE YOUR DIABETES BEFORE AND AFTER SURGERY  Why is it important to control my blood sugar before and after surgery? Improving blood sugar levels before and after surgery helps healing and can limit problems. A way of improving blood sugar control is eating a healthy diet by:  Eating less sugar and carbohydrates  Increasing activity/exercise  Talking with your doctor about reaching your blood sugar goals High blood sugars (greater than 180 mg/dL) can raise your risk of infections and slow your recovery, so you will need to focus on controlling your diabetes during the weeks before surgery. Make sure that the doctor who takes care of your  diabetes knows about your planned surgery including the date and location.  How do I manage my blood sugar before surgery? Check your blood sugar at least 4 times a day, starting 2 days before surgery, to make sure that the level is not too high or low.  Check your blood sugar the morning of your surgery when you wake up and every 2 hours until you get to the Short Stay unit.  If your blood sugar is less than 70 mg/dL, you will need to treat for low blood sugar: Do not take insulin. Treat a low blood sugar (less than 70 mg/dL) with  cup of clear juice (cranberry or apple), 4 glucose tablets, OR glucose gel. Recheck blood sugar in 15 minutes after treatment (to make sure it is greater than 70 mg/dL). If your blood sugar is not greater than 70 mg/dL on recheck, call [phone] for further instructions. Report your blood sugar to the short stay nurse when you get to Short Stay.  If you are admitted to the hospital after surgery: Your blood sugar will be checked by the staff and you will probably be given insulin after surgery (instead of oral diabetes medicines) to make sure you have good blood sugar levels. The goal for blood sugar control after surgery is 80-180 mg/dL.                      Do NOT Smoke (Tobacco/Vaping) for 24 hours prior to your procedure.  If you use a CPAP at night, you may bring  your mask/headgear for your overnight stay.   Contacts, glasses, piercing's, hearing aid's, dentures or partials may not be worn into surgery, please bring cases for these belongings.    For patients admitted to the hospital, discharge time will be determined by your treatment team.   Patients discharged the day of surgery will not be allowed to drive home, and someone needs to stay with them for 24 hours.  SURGICAL WAITING ROOM VISITATION Patients having surgery or a procedure may have no more than 2 support people in the waiting area - these visitors may rotate.   Children under the age  of 28 must have an adult with them who is not the patient. If the patient needs to stay at the hospital during part of their recovery, the visitor guidelines for inpatient rooms apply. Pre-op nurse will coordinate an appropriate time for 1 support person to accompany patient in pre-op.  This support person may not rotate.   Please refer to the [facility] website for the visitor guidelines for Inpatients (after your surgery is over and you are in a regular room).    Special instructions:   [facility]- Preparing For Surgery  Before surgery, you can play an important role. Because skin is not sterile, your skin needs to be as free of germs as possible. You can reduce the number of germs on your skin by washing with CHG (chlorahexidine gluconate) Soap before surgery.  CHG is an antiseptic cleaner which kills germs and bonds with the skin to continue killing germs even after washing.    Oral Hygiene is also important to reduce your risk of infection.  Remember - BRUSH YOUR TEETH THE MORNING OF SURGERY WITH YOUR REGULAR TOOTHPASTE  Please do not use if you have an allergy to CHG or antibacterial soaps. If your skin becomes reddened/irritated stop using the CHG.  Do not shave (including legs and underarms) for at least 48 hours prior to first CHG shower. It is OK to shave your face.  Please follow these instructions carefully.   Shower the NIGHT BEFORE SURGERY and the MORNING OF SURGERY  If you chose to wash your hair, wash your hair first as usual with your normal shampoo.  After you shampoo, rinse your hair and body thoroughly to remove the shampoo.  Use CHG Soap as you would any other liquid soap. You can apply CHG directly to the skin and wash gently with a scrungie or a clean washcloth.   Apply the CHG Soap to your body ONLY FROM THE NECK DOWN.  Do not use on open wounds or open sores. Avoid contact with your eyes, ears, mouth and genitals (private parts). Wash Face and genitals (private  parts)  with your normal soap.   Wash thoroughly, paying special attention to the area where your surgery will be performed.  Thoroughly rinse your body with warm water from the neck down.  DO NOT shower/wash with your normal soap after using and rinsing off the CHG Soap.  Pat yourself dry with a CLEAN TOWEL.  Wear CLEAN PAJAMAS to bed the night before surgery  Place CLEAN SHEETS on your bed the night before your surgery  DO NOT SLEEP WITH PETS.   Day of Surgery: Take a shower with CHG soap. Do not wear jewelry or makeup Do not wear lotions, powders, perfumes/colognes, or deodorant. Do not shave 48 hours prior to surgery.   Do not bring valuables to the hospital.  [facility] is not responsible for any belongings  or valuables. Do not wear nail polish, gel polish, artificial nails, or any other type of covering on natural nails (fingers and toes) If you have artificial nails or gel coating that need to be removed by a nail salon, please have this removed prior to surgery. Artificial nails or gel coating may interfere with anesthesia's ability to adequately monitor your vital signs.  Wear Clean/Comfortable clothing the morning of surgery Remember to brush your teeth WITH YOUR REGULAR TOOTHPASTE.   Please read over the following fact sheets that you were given.    If you received a COVID test during your pre-op visit  it is requested that you wear a mask when out in public, stay away from anyone that may not be feeling well and notify your surgeon if you develop symptoms. If you have been in contact with anyone that has tested positive in the last 10 days please notify you surgeon.

## 2021-11-30 NOTE — Assessment & Plan Note (Addendum)
Repeat lipid panel: LDL not at goal with elevated triglyceride. Maintain atorvastatin dose Add fenofibrate Repeat lipid panel in 28month

## 2021-11-30 NOTE — Progress Notes (Signed)
Established Patient Visit  Patient: Sandra Ray   DOB: 1946-02-17   76 y.o. Female  MRN: [id number] Visit Date: 12/02/2021  Subjective:    Chief Complaint  Patient presents with   Office Visit    HTN/ Hyperlipidemia F/u Checks BP daily  Pt fasting     HPI Essential hypertension BP at goal BP Readings from Last 3 Encounters:  11/30/21 128/80  09/22/21 (!) 158/68  06/09/21 130/78    Hyperlipidemia LDL goal <70 Repeat lipid panel: LDL not at goal with elevated triglyceride. Maintain atorvastatin dose Add fenofibrate Repeat lipid panel in 85month   Reviewed medical, surgical, and social history today  Medications: Outpatient Medications Prior to Visit  Medication Sig   atorvastatin (LIPITOR) 40 MG tablet TAKE 1 TABLET BY MOUTH  DAILY AT 6 PM   Coenzyme Q10 400 MG CAPS Take 400 mg by mouth daily.    Dapagliflozin-metFORMIN HCl ER (XIGDUO XR) 12-998 MG TB24 Take 1 tablet by mouth daily.   diclofenac Sodium (VOLTAREN) 1 % GEL Apply 1 Application topically 4 (four) times daily as needed (pain).   fluticasone (FLONASE) 50 MCG/ACT nasal spray Place 2 sprays into both nostrils daily. (Patient taking differently: Place 1 spray into both nostrils daily as needed for allergies.)   gabapentin (NEURONTIN) 300 MG capsule TAKE 1 CAPSULE BY MOUTH 3  TIMES DAILY   Garlic 16269MG CAPS Take 1,000 mg by mouth daily.    glimepiride (AMARYL) 2 MG tablet TAKE 1 TABLET BY MOUTH  DAILY BEFORE BREAKFAST (Patient taking differently: Take 6 mg by mouth daily with breakfast.)   hydrochlorothiazide (HYDRODIURIL) 25 MG tablet TAKE 1 TABLET BY MOUTH  DAILY   Lancets (ONETOUCH ULTRASOFT) lancets Use as instructed once day.  E11.40   lisinopril (ZESTRIL) 5 MG tablet TAKE 1 TABLET BY MOUTH  DAILY   ondansetron (ZOFRAN) 4 MG tablet Take 1 tablet (4 mg total) by mouth as needed for nausea or vomiting.   ONETOUCH VERIO test strip USE AS INSTRUCTED TO CHECK  BLOOD SUGAR TWICE DAILY   potassium  chloride SA (KLOR-CON M) 20 MEQ tablet TAKE 1 TABLET BY MOUTH DAILY   [DISCONTINUED] acetaminophen (TYLENOL) 650 MG CR tablet Take 650 mg by mouth every 8 (eight) hours as needed for pain.   [DISCONTINUED] bacitracin ointment Apply 1 application topically 2 (two) times daily.   [DISCONTINUED] omega-3 acid ethyl esters (LOVAZA) 1 g capsule Take 1 capsule (1 g total) by mouth 2 (two) times daily.   [DISCONTINUED] Dapagliflozin-metFORMIN HCl ER (XIGDUO XR) 07-998 MG TB24 Take 1 tablet by mouth 2 (two) times daily. Take 1 tablet by mouth twice daily. (Patient not taking: Reported on 09/22/2021)   [DISCONTINUED] Dulaglutide (TRULICITY) 3 MSW/5.4OESOPN Inject 3 mg into the skin once a week. Inject '3mg'$  under the skin once weekly.   [DISCONTINUED] FARXIGA 5 MG TABS tablet TAKE 1 TABLET BY MOUTH DAILY  BEFORE BREAKFAST (Patient not taking: Reported on 08/30/2021)   Facility-Administered Medications Prior to Visit  Medication Dose Route Frequency Provider   [START ON 12/08/2021] vancomycin (VANCOREADY) IVPB 1500 mg/300 mL  1,500 mg Intravenous Once BMelina Schools MD   Reviewed past medical and social history.   ROS per HPI above      Objective:  BP 128/80 (BP Location: Left Arm, Patient Position: Sitting, Cuff Size: Small)   Pulse 75   Temp 97.8 F (36.6 C) (Temporal)   Ht  5' 8.5" (1.74 m)   Wt 176 lb (79.8 kg)   SpO2 96%   BMI 26.37 kg/m      Physical Exam Vitals reviewed.  Cardiovascular:     Rate and Rhythm: Normal rate and regular rhythm.     Pulses: Normal pulses.     Heart sounds: Normal heart sounds.  Pulmonary:     Effort: Pulmonary effort is normal.     Breath sounds: Normal breath sounds.  Musculoskeletal:     Right lower leg: No edema.     Left lower leg: No edema.  Neurological:     Mental Status: She is alert and oriented to person, place, and time.     Results for orders placed or performed in visit on 11/30/21  Lipid panel  Result Value Ref Range   Cholesterol 151  0 - 200 mg/dL   Triglycerides 229.0 (H) 0.0 - 149.0 mg/dL   HDL 50.30 >39.00 mg/dL   VLDL 45.8 (H) 0.0 - 40.0 mg/dL   Total CHOL/HDL Ratio 3    NonHDL 100.20   LDL cholesterol, direct  Result Value Ref Range   Direct LDL 89.0 mg/dL      Assessment & Plan:    Problem List Items Addressed This Visit       Cardiovascular and Mediastinum   Essential hypertension    BP at goal BP Readings from Last 3 Encounters:  11/30/21 128/80  09/22/21 (!) 158/68  06/09/21 130/78        Relevant Medications   fenofibrate (TRICOR) 145 MG tablet     Other   Hyperlipidemia LDL goal <70 - Primary    Repeat lipid panel: LDL not at goal with elevated triglyceride. Maintain atorvastatin dose Add fenofibrate Repeat lipid panel in 33month      Relevant Medications   fenofibrate (TRICOR) 145 MG tablet   Other Relevant Orders   Lipid panel (Completed)   Lipid panel   Other Visit Diagnoses     Need for immunization against influenza       Relevant Orders   Flu Vaccine QUAD High Dose(Fluad) (Completed)      Return in about 6 months (around 05/31/2022) for DM, HTN, hyperlipidemia (fasting).     CWilfred Lacy NP

## 2021-12-01 ENCOUNTER — Encounter (HOSPITAL_COMMUNITY): Payer: Self-pay

## 2021-12-01 ENCOUNTER — Encounter (HOSPITAL_COMMUNITY)
Admission: RE | Admit: 2021-12-01 | Discharge: 2021-12-01 | Disposition: A | Discharge status: Home / Self Care | Payer: Medicare Other | Source: Ambulatory Visit | Attending: Orthopedic Surgery | Admitting: Orthopedic Surgery

## 2021-12-01 VITALS — BP 157/83 | HR 80 | Temp 97.9°F | Resp 17 | Ht 68.5 in | Wt 178.8 lb

## 2021-12-01 DIAGNOSIS — E1151 Type 2 diabetes mellitus with diabetic peripheral angiopathy without gangrene: Secondary | ICD-10-CM | POA: Diagnosis not present

## 2021-12-01 DIAGNOSIS — Z01818 Encounter for other preprocedural examination: Secondary | ICD-10-CM | POA: Insufficient documentation

## 2021-12-01 LAB — CBC
HCT: 45.8 % (ref 36.0–46.0)
Hemoglobin: 15.2 g/dL — ABNORMAL HIGH (ref 12.0–15.0)
MCH: 31.4 pg (ref 26.0–34.0)
MCHC: 33.2 g/dL (ref 30.0–36.0)
MCV: 94.6 fL (ref 80.0–100.0)
Platelets: 281 10*3/uL (ref 150–400)
RBC: 4.84 MIL/uL (ref 3.87–5.11)
RDW: 14 % (ref 11.5–15.5)
WBC: 9 10*3/uL (ref 4.0–10.5)
nRBC: 0 % (ref 0.0–0.2)

## 2021-12-01 LAB — BASIC METABOLIC PANEL
Anion gap: 10 (ref 5–15)
BUN: 22 mg/dL (ref 8–23)
CO2: 29 mmol/L (ref 22–32)
Calcium: 10.2 mg/dL (ref 8.9–10.3)
Chloride: 99 mmol/L (ref 98–111)
Creatinine, Ser: 0.67 mg/dL (ref 0.44–1.00)
GFR, Estimated: >60 mL/min (ref >60)
Glucose, Bld: 141 mg/dL — ABNORMAL HIGH (ref 70–99)
Potassium: 4.1 mmol/L (ref 3.5–5.1)
Sodium: 138 mmol/L (ref 135–145)

## 2021-12-01 LAB — GLUCOSE, CAPILLARY: Glucose-Capillary: 232 mg/dL — ABNORMAL HIGH (ref 70–99)

## 2021-12-01 LAB — SURGICAL PCR SCREEN
MRSA, PCR: NEGATIVE
Staphylococcus aureus: NEGATIVE

## 2021-12-01 NOTE — Progress Notes (Signed)
PCP - Wilfred Lacy NP Cardiologist - Denies Endocrinologist: Dr. Elayne Snare  PPM/ICD - Denies  Chest x-ray - N/A EKG - 12/01/21 Stress Test - Denies ECHO - Denies Cardiac Cath - Denies  Sleep Study - Denies  Fasting Blood Sugar - 89/130 Checks Blood Sugar: 2x per day for 3 days/week  Blood Thinner Instructions: N/A Aspirin Instructions: Stop ASA 5-7 days prior to surgery.  ERAS Protcol - No  COVID TEST- N/A   Anesthesia review: No  Patient denies shortness of breath, fever, cough and chest pain at PAT appointment   All instructions explained to the patient, with a verbal understanding of the material. Patient agrees to go over the instructions while at home for a better understanding. Patient also instructed to self quarantine after being tested for COVID-19. The opportunity to ask questions was provided.

## 2021-12-01 NOTE — Pre-Procedure Instructions (Signed)
Surgical Instructions    Your procedure is scheduled on December 08, 2021.  Report to [facility] Main Entrance "A" at 10:00 A.M., then check in with the Admitting office.  Call this number if you have problems the morning of surgery:  [phone]   If you have any questions prior to your surgery date call [phone]: Open Monday-Friday 8am-4pm    Remember:  Do not eat or drink after midnight the night before your surgery      Take these medicines the morning of surgery with A SIP OF WATER:  atorvastatin (LIPITOR)  gabapentin (NEURONTIN)     Take these medicines the morning of surgery with a sip of water AS NEEDED:  acetaminophen (TYLENOL)  fluticasone (FLONASE)  ondansetron (ZOFRAN)   As of today, STOP taking any Aspirin (unless otherwise instructed by your surgeon) Aleve, Naproxen, Ibuprofen, Motrin, Advil, Goody's, BC's, all herbal medications, fish oil, and all vitamins. This includes your medication: diclofenac Sodium (VOLTAREN)     WHAT DO I DO ABOUT MY DIABETES MEDICATION?   STOP taking Dapagliflozin-metFORMIN HCl ER (XIGDUO XR) three days prior to surgery. Your last dose will be on (9/24).  DO NOT TAKE weekly dose of Dulaglutide (TRULICITY) one week prior to surgery.  DO NOT take evening dose of glimepiride (AMARYL) (9/27) the night before surgery and do not take glimepiride (AMARYL) the morning of surgery (9/28).   HOW TO MANAGE YOUR DIABETES BEFORE AND AFTER SURGERY  Why is it important to control my blood sugar before and after surgery? Improving blood sugar levels before and after surgery helps healing and can limit problems. A way of improving blood sugar control is eating a healthy diet by:  Eating less sugar and carbohydrates  Increasing activity/exercise  Talking with your doctor about reaching your blood sugar goals High blood sugars (greater than 180 mg/dL) can raise your risk of infections and slow your recovery, so you will need to focus on  controlling your diabetes during the weeks before surgery. Make sure that the doctor who takes care of your diabetes knows about your planned surgery including the date and location.  How do I manage my blood sugar before surgery? Check your blood sugar at least 4 times a day, starting 2 days before surgery, to make sure that the level is not too high or low.  Check your blood sugar the morning of your surgery when you wake up and every 2 hours until you get to the Short Stay unit.  If your blood sugar is less than 70 mg/dL, you will need to treat for low blood sugar: Do not take insulin. Treat a low blood sugar (less than 70 mg/dL) with  cup of clear juice (cranberry or apple), 4 glucose tablets, OR glucose gel. Recheck blood sugar in 15 minutes after treatment (to make sure it is greater than 70 mg/dL). If your blood sugar is not greater than 70 mg/dL on recheck, call [phone] for further instructions. Report your blood sugar to the short stay nurse when you get to Short Stay.  If you are admitted to the hospital after surgery: Your blood sugar will be checked by the staff and you will probably be given insulin after surgery (instead of oral diabetes medicines) to make sure you have good blood sugar levels. The goal for blood sugar control after surgery is 80-180 mg/dL.                      Do NOT Smoke (  Tobacco/Vaping) for 24 hours prior to your procedure.  If you use a CPAP at night, you may bring your mask/headgear for your overnight stay.   Contacts, glasses, piercing's, hearing aid's, dentures or partials may not be worn into surgery, please bring cases for these belongings.    For patients admitted to the hospital, discharge time will be determined by your treatment team.   Patients discharged the day of surgery will not be allowed to drive home, and someone needs to stay with them for 24 hours.  SURGICAL WAITING ROOM VISITATION Patients having surgery or a procedure may  have no more than 2 support people in the waiting area - these visitors may rotate.   Children under the age of 21 must have an adult with them who is not the patient. If the patient needs to stay at the hospital during part of their recovery, the visitor guidelines for inpatient rooms apply. Pre-op nurse will coordinate an appropriate time for 1 support person to accompany patient in pre-op.  This support person may not rotate.   Please refer to the [facility] website for the visitor guidelines for Inpatients (after your surgery is over and you are in a regular room).    Special instructions:   [facility]- Preparing For Surgery  Before surgery, you can play an important role. Because skin is not sterile, your skin needs to be as free of germs as possible. You can reduce the number of germs on your skin by washing with CHG (chlorahexidine gluconate) Soap before surgery.  CHG is an antiseptic cleaner which kills germs and bonds with the skin to continue killing germs even after washing.    Oral Hygiene is also important to reduce your risk of infection.  Remember - BRUSH YOUR TEETH THE MORNING OF SURGERY WITH YOUR REGULAR TOOTHPASTE  Please do not use if you have an allergy to CHG or antibacterial soaps. If your skin becomes reddened/irritated stop using the CHG.  Do not shave (including legs and underarms) for at least 48 hours prior to first CHG shower. It is OK to shave your face.  Please follow these instructions carefully.   Shower the NIGHT BEFORE SURGERY and the MORNING OF SURGERY  If you chose to wash your hair, wash your hair first as usual with your normal shampoo.  After you shampoo, rinse your hair and body thoroughly to remove the shampoo.  Use CHG Soap as you would any other liquid soap. You can apply CHG directly to the skin and wash gently with a scrungie or a clean washcloth.   Apply the CHG Soap to your body ONLY FROM THE NECK DOWN.  Do not use on open wounds or open  sores. Avoid contact with your eyes, ears, mouth and genitals (private parts). Wash Face and genitals (private parts)  with your normal soap.   Wash thoroughly, paying special attention to the area where your surgery will be performed.  Thoroughly rinse your body with warm water from the neck down.  DO NOT shower/wash with your normal soap after using and rinsing off the CHG Soap.  Pat yourself dry with a CLEAN TOWEL.  Wear CLEAN PAJAMAS to bed the night before surgery  Place CLEAN SHEETS on your bed the night before your surgery  DO NOT SLEEP WITH PETS.   Day of Surgery: Take a shower with CHG soap. Do not wear jewelry or makeup Do not wear lotions, powders, perfumes/colognes, or deodorant. Do not shave 48 hours prior to  surgery.   Do not bring valuables to the hospital.  [facility] is not responsible for any belongings or valuables. Do not wear nail polish, gel polish, artificial nails, or any other type of covering on natural nails (fingers and toes) If you have artificial nails or gel coating that need to be removed by a nail salon, please have this removed prior to surgery. Artificial nails or gel coating may interfere with anesthesia's ability to adequately monitor your vital signs.  Wear Clean/Comfortable clothing the morning of surgery Remember to brush your teeth WITH YOUR REGULAR TOOTHPASTE.   Please read over the following fact sheets that you were given.    If you received a COVID test during your pre-op visit  it is requested that you wear a mask when out in public, stay away from anyone that may not be feeling well and notify your surgeon if you develop symptoms. If you have been in contact with anyone that has tested positive in the last 10 days please notify you surgeon.

## 2021-12-02 MED ORDER — FENOFIBRATE 145 MG PO TABS: 145.0000 mg | ORAL_TABLET | Freq: Every day | ORAL | 1 refills | Status: DC

## 2021-12-06 ENCOUNTER — Telehealth: Payer: Self-pay | Admitting: Nurse Practitioner

## 2021-12-06 ENCOUNTER — Telehealth: Payer: Self-pay

## 2021-12-06 NOTE — Telephone Encounter (Signed)
Called & left Pt a VM, adv to call back to discuss reasoning for needing zofran

## 2021-12-06 NOTE — Telephone Encounter (Signed)
Pt called and stated that she seen nche this week and nche is suppose to send a medication in for her but when she called/went to the pharmacy it was not put in... I asked the pt the name of the medication and she just said it start with a "T "

## 2021-12-06 NOTE — Telephone Encounter (Signed)
Patient called states that she is having back surgery on Thursday. She wanted to let you know. She is also requesting more zofran as well. Needs to know if she needs to reschedule her appt. I advised that would depend on how she felt and if she was able to come for the appt at that time. Please advise

## 2021-12-08 ENCOUNTER — Ambulatory Visit (HOSPITAL_COMMUNITY): Payer: Medicare Other | Admitting: Anesthesiology

## 2021-12-08 ENCOUNTER — Ambulatory Visit (HOSPITAL_BASED_OUTPATIENT_CLINIC_OR_DEPARTMENT_OTHER): Payer: Medicare Other | Admitting: Anesthesiology

## 2021-12-08 ENCOUNTER — Other Ambulatory Visit: Payer: Self-pay

## 2021-12-08 ENCOUNTER — Observation Stay (HOSPITAL_COMMUNITY)
Admission: RE | Admit: 2021-12-08 | Discharge: 2021-12-09 | Disposition: A | Discharge status: Home / Self Care | Payer: Medicare Other | Attending: Orthopedic Surgery | Admitting: Orthopedic Surgery

## 2021-12-08 ENCOUNTER — Encounter (HOSPITAL_COMMUNITY): Payer: Self-pay | Admitting: Orthopedic Surgery

## 2021-12-08 ENCOUNTER — Ambulatory Visit (HOSPITAL_COMMUNITY): Payer: Medicare Other

## 2021-12-08 ENCOUNTER — Encounter (HOSPITAL_COMMUNITY)
Admission: RE | Disposition: A | Discharge status: Home / Self Care | Payer: Self-pay | Source: Home / Self Care | Attending: Orthopedic Surgery

## 2021-12-08 ENCOUNTER — Other Ambulatory Visit: Payer: Self-pay | Admitting: Endocrinology

## 2021-12-08 DIAGNOSIS — Z7985 Long-term (current) use of injectable non-insulin antidiabetic drugs: Secondary | ICD-10-CM | POA: Diagnosis not present

## 2021-12-08 DIAGNOSIS — M961 Postlaminectomy syndrome, not elsewhere classified: Secondary | ICD-10-CM | POA: Diagnosis not present

## 2021-12-08 DIAGNOSIS — G8929 Other chronic pain: Secondary | ICD-10-CM | POA: Diagnosis not present

## 2021-12-08 DIAGNOSIS — R2681 Unsteadiness on feet: Secondary | ICD-10-CM | POA: Diagnosis not present

## 2021-12-08 DIAGNOSIS — M545 Low back pain, unspecified: Secondary | ICD-10-CM | POA: Insufficient documentation

## 2021-12-08 DIAGNOSIS — E1151 Type 2 diabetes mellitus with diabetic peripheral angiopathy without gangrene: Secondary | ICD-10-CM

## 2021-12-08 DIAGNOSIS — Z79899 Other long term (current) drug therapy: Secondary | ICD-10-CM | POA: Insufficient documentation

## 2021-12-08 DIAGNOSIS — Z9682 Presence of neurostimulator: Secondary | ICD-10-CM | POA: Diagnosis not present

## 2021-12-08 DIAGNOSIS — T85112A Breakdown (mechanical) of implanted electronic neurostimulator (electrode) of spinal cord, initial encounter: Secondary | ICD-10-CM | POA: Diagnosis not present

## 2021-12-08 DIAGNOSIS — G709 Myoneural disorder, unspecified: Secondary | ICD-10-CM

## 2021-12-08 DIAGNOSIS — Z7982 Long term (current) use of aspirin: Secondary | ICD-10-CM | POA: Diagnosis not present

## 2021-12-08 DIAGNOSIS — M1990 Unspecified osteoarthritis, unspecified site: Secondary | ICD-10-CM | POA: Diagnosis not present

## 2021-12-08 DIAGNOSIS — Z7984 Long term (current) use of oral hypoglycemic drugs: Secondary | ICD-10-CM | POA: Insufficient documentation

## 2021-12-08 DIAGNOSIS — I10 Essential (primary) hypertension: Secondary | ICD-10-CM | POA: Diagnosis not present

## 2021-12-08 DIAGNOSIS — E119 Type 2 diabetes mellitus without complications: Secondary | ICD-10-CM | POA: Insufficient documentation

## 2021-12-08 HISTORY — PX: SPINAL CORD STIMULATOR INSERTION: SHX5378

## 2021-12-08 LAB — GLUCOSE, CAPILLARY
Glucose-Capillary: 164 mg/dL — ABNORMAL HIGH (ref 70–99)
Glucose-Capillary: 128 mg/dL — ABNORMAL HIGH (ref 70–99)
Glucose-Capillary: 181 mg/dL — ABNORMAL HIGH (ref 70–99)
Glucose-Capillary: 188 mg/dL — ABNORMAL HIGH (ref 70–99)
Glucose-Capillary: 230 mg/dL — ABNORMAL HIGH (ref 70–99)
Glucose-Capillary: 202 mg/dL — ABNORMAL HIGH (ref 70–99)

## 2021-12-08 SURGERY — INSERTION, SPINAL CORD STIMULATOR, LUMBAR
Anesthesia: General | Site: Spine Thoracic

## 2021-12-08 MED ORDER — SCOPOLAMINE 1 MG/3DAYS TD PT72
MEDICATED_PATCH | TRANSDERMAL | Status: AC
  Filled 2021-12-08: qty 1

## 2021-12-08 MED ORDER — ONDANSETRON HCL 4 MG/2ML IJ SOLN
4.0000 mg | Freq: Four times a day (QID) | INTRAMUSCULAR | Status: DC | PRN
  Administered 2021-12-08: 4 mg via INTRAVENOUS
  Filled 2021-12-08: qty 2

## 2021-12-08 MED ORDER — HYDROCHLOROTHIAZIDE 25 MG PO TABS: 25.0000 mg | ORAL_TABLET | Freq: Every day | ORAL | Status: DC

## 2021-12-08 MED ORDER — HYDROMORPHONE HCL 1 MG/ML IJ SOLN
0.2500 mg | INTRAMUSCULAR | Status: DC | PRN
  Administered 2021-12-08 (×2): 0.5 mg via INTRAVENOUS

## 2021-12-08 MED ORDER — EPHEDRINE 5 MG/ML INJ
INTRAVENOUS | Status: AC
  Filled 2021-12-08: qty 5

## 2021-12-08 MED ORDER — 0.9 % SODIUM CHLORIDE (POUR BTL) OPTIME
TOPICAL | Status: DC | PRN
  Administered 2021-12-08: 1000 mL

## 2021-12-08 MED ORDER — METFORMIN HCL ER 500 MG PO TB24: 1000.0000 mg | ORAL_TABLET | Freq: Every day | ORAL | Status: DC

## 2021-12-08 MED ORDER — ACETAMINOPHEN 325 MG PO TABS: 650.0000 mg | ORAL_TABLET | ORAL | Status: DC | PRN

## 2021-12-08 MED ORDER — SUGAMMADEX SODIUM 200 MG/2ML IV SOLN
INTRAVENOUS | Status: DC | PRN
  Administered 2021-12-08: 160 mg via INTRAVENOUS

## 2021-12-08 MED ORDER — ROCURONIUM BROMIDE 10 MG/ML (PF) SYRINGE
PREFILLED_SYRINGE | INTRAVENOUS | Status: DC | PRN
  Administered 2021-12-08: 50 mg via INTRAVENOUS

## 2021-12-08 MED ORDER — PROPOFOL 10 MG/ML IV BOLUS
INTRAVENOUS | Status: AC
  Filled 2021-12-08: qty 20

## 2021-12-08 MED ORDER — SODIUM CHLORIDE 0.9% FLUSH: 3.0000 mL | Freq: Two times a day (BID) | INTRAVENOUS | Status: DC

## 2021-12-08 MED ORDER — ONDANSETRON HCL 4 MG/2ML IJ SOLN
INTRAMUSCULAR | Status: AC
  Filled 2021-12-08: qty 2

## 2021-12-08 MED ORDER — INSULIN ASPART 100 UNIT/ML IJ SOLN
0.0000 [IU] | Freq: Every day | INTRAMUSCULAR | Status: DC
  Administered 2021-12-08: 2 [IU] via SUBCUTANEOUS

## 2021-12-08 MED ORDER — LACTATED RINGERS IV SOLN: INTRAVENOUS | Status: DC

## 2021-12-08 MED ORDER — VANCOMYCIN HCL 500 MG IV SOLR
INTRAVENOUS | Status: DC | PRN
  Administered 2021-12-08: 500 mg via TOPICAL

## 2021-12-08 MED ORDER — PROPOFOL 10 MG/ML IV BOLUS
INTRAVENOUS | Status: DC | PRN
  Administered 2021-12-08: 20 mg via INTRAVENOUS
  Administered 2021-12-08: 150 mg via INTRAVENOUS

## 2021-12-08 MED ORDER — INSULIN ASPART 100 UNIT/ML IJ SOLN
0.0000 [IU] | Freq: Three times a day (TID) | INTRAMUSCULAR | Status: DC
  Administered 2021-12-09: 3 [IU] via SUBCUTANEOUS

## 2021-12-08 MED ORDER — DAPAGLIFLOZIN PROPANEDIOL 10 MG PO TABS
10.0000 mg | ORAL_TABLET | Freq: Every day | ORAL | Status: DC
  Filled 2021-12-08: qty 1

## 2021-12-08 MED ORDER — LIDOCAINE 2% (20 MG/ML) 5 ML SYRINGE
INTRAMUSCULAR | Status: AC
  Filled 2021-12-08: qty 5

## 2021-12-08 MED ORDER — LIDOCAINE 2% (20 MG/ML) 5 ML SYRINGE
INTRAMUSCULAR | Status: DC | PRN
  Administered 2021-12-08: 100 mg via INTRAVENOUS

## 2021-12-08 MED ORDER — THROMBIN 20000 UNITS EX KIT
PACK | CUTANEOUS | Status: AC
  Filled 2021-12-08: qty 1

## 2021-12-08 MED ORDER — CHLORHEXIDINE GLUCONATE 0.12 % MT SOLN
15.0000 mL | Freq: Once | OROMUCOSAL | Status: AC
  Administered 2021-12-08: 15 mL via OROMUCOSAL
  Filled 2021-12-08: qty 15

## 2021-12-08 MED ORDER — SODIUM CHLORIDE 0.9 % IV SOLN: INTRAVENOUS | Status: DC

## 2021-12-08 MED ORDER — PHENOL 1.4 % MT LIQD: 1.0000 | OROMUCOSAL | Status: DC | PRN

## 2021-12-08 MED ORDER — OXYCODONE HCL 5 MG PO TABS
10.0000 mg | ORAL_TABLET | ORAL | Status: DC | PRN
  Administered 2021-12-08 – 2021-12-09 (×4): 10 mg via ORAL
  Filled 2021-12-08 (×4): qty 2

## 2021-12-08 MED ORDER — HYDROMORPHONE HCL 1 MG/ML IJ SOLN
INTRAMUSCULAR | Status: AC
  Filled 2021-12-08: qty 1

## 2021-12-08 MED ORDER — METHOCARBAMOL 500 MG PO TABS: 500.0000 mg | ORAL_TABLET | Freq: Three times a day (TID) | ORAL | 0 refills | Status: AC | PRN

## 2021-12-08 MED ORDER — FLEET ENEMA 7-19 GM/118ML RE ENEM: 1.0000 | ENEMA | Freq: Once | RECTAL | Status: DC | PRN

## 2021-12-08 MED ORDER — VANCOMYCIN HCL IN DEXTROSE 1-5 GM/200ML-% IV SOLN
1000.0000 mg | INTRAVENOUS | Status: DC
  Filled 2021-12-08: qty 200

## 2021-12-08 MED ORDER — INSULIN ASPART 100 UNIT/ML IJ SOLN
0.0000 [IU] | INTRAMUSCULAR | Status: DC | PRN
  Administered 2021-12-08: 2 [IU] via SUBCUTANEOUS

## 2021-12-08 MED ORDER — ACETAMINOPHEN 650 MG RE SUPP: 650.0000 mg | RECTAL | Status: DC | PRN

## 2021-12-08 MED ORDER — VANCOMYCIN HCL 1000 MG IV SOLR
1000.0000 mg | INTRAVENOUS | Status: DC
  Filled 2021-12-08: qty 20

## 2021-12-08 MED ORDER — METHOCARBAMOL 500 MG PO TABS
500.0000 mg | ORAL_TABLET | Freq: Four times a day (QID) | ORAL | Status: DC | PRN
  Administered 2021-12-08 – 2021-12-09 (×3): 500 mg via ORAL
  Filled 2021-12-08 (×3): qty 1

## 2021-12-08 MED ORDER — SODIUM CHLORIDE 0.9% FLUSH: 3.0000 mL | INTRAVENOUS | Status: DC | PRN

## 2021-12-08 MED ORDER — THROMBIN 20000 UNITS EX SOLR
CUTANEOUS | Status: AC
  Filled 2021-12-08: qty 20000

## 2021-12-08 MED ORDER — METHOCARBAMOL 1000 MG/10ML IJ SOLN: 500.0000 mg | Freq: Four times a day (QID) | INTRAVENOUS | Status: DC | PRN

## 2021-12-08 MED ORDER — CEFAZOLIN SODIUM-DEXTROSE 2-3 GM-%(50ML) IV SOLR
INTRAVENOUS | Status: DC | PRN
  Administered 2021-12-08: 2 g via INTRAVENOUS

## 2021-12-08 MED ORDER — THROMBIN (RECOMBINANT) 20000 UNITS EX SOLR
CUTANEOUS | Status: AC
  Filled 2021-12-08: qty 20000

## 2021-12-08 MED ORDER — ATORVASTATIN CALCIUM 40 MG PO TABS: 40.0000 mg | ORAL_TABLET | Freq: Every day | ORAL | Status: DC

## 2021-12-08 MED ORDER — ACETAMINOPHEN 10 MG/ML IV SOLN
INTRAVENOUS | Status: AC
  Filled 2021-12-08: qty 100

## 2021-12-08 MED ORDER — FENTANYL CITRATE (PF) 250 MCG/5ML IJ SOLN
INTRAMUSCULAR | Status: DC | PRN
  Administered 2021-12-08: 50 ug via INTRAVENOUS
  Administered 2021-12-08 (×2): 25 ug via INTRAVENOUS
  Administered 2021-12-08: 50 ug via INTRAVENOUS

## 2021-12-08 MED ORDER — POLYETHYLENE GLYCOL 3350 17 G PO PACK: 17.0000 g | PACK | Freq: Every day | ORAL | Status: DC | PRN

## 2021-12-08 MED ORDER — ORAL CARE MOUTH RINSE: 15.0000 mL | Freq: Once | OROMUCOSAL | Status: AC

## 2021-12-08 MED ORDER — DEXAMETHASONE SODIUM PHOSPHATE 10 MG/ML IJ SOLN
INTRAMUSCULAR | Status: AC
  Filled 2021-12-08: qty 1

## 2021-12-08 MED ORDER — MENTHOL 3 MG MT LOZG: 1.0000 | LOZENGE | OROMUCOSAL | Status: DC | PRN

## 2021-12-08 MED ORDER — ONDANSETRON HCL 4 MG/2ML IJ SOLN
INTRAMUSCULAR | Status: DC | PRN
  Administered 2021-12-08: 4 mg via INTRAVENOUS

## 2021-12-08 MED ORDER — DEXAMETHASONE SODIUM PHOSPHATE 10 MG/ML IJ SOLN
INTRAMUSCULAR | Status: DC | PRN
  Administered 2021-12-08: 4 mg via INTRAVENOUS

## 2021-12-08 MED ORDER — GABAPENTIN 300 MG PO CAPS
300.0000 mg | ORAL_CAPSULE | Freq: Three times a day (TID) | ORAL | Status: DC
  Administered 2021-12-08: 300 mg via ORAL
  Filled 2021-12-08: qty 1

## 2021-12-08 MED ORDER — ROCURONIUM BROMIDE 10 MG/ML (PF) SYRINGE
PREFILLED_SYRINGE | INTRAVENOUS | Status: AC
  Filled 2021-12-08: qty 10

## 2021-12-08 MED ORDER — DAPAGLIFLOZIN PRO-METFORMIN ER 10-1000 MG PO TB24: 1.0000 | ORAL_TABLET | Freq: Every day | ORAL | Status: DC

## 2021-12-08 MED ORDER — ONDANSETRON HCL 4 MG PO TABS: 4.0000 mg | ORAL_TABLET | Freq: Three times a day (TID) | ORAL | 0 refills | Status: DC | PRN

## 2021-12-08 MED ORDER — OXYCODONE HCL 5 MG PO TABS: 5.0000 mg | ORAL_TABLET | ORAL | Status: DC | PRN

## 2021-12-08 MED ORDER — SCOPOLAMINE 1 MG/3DAYS TD PT72
1.0000 | MEDICATED_PATCH | Freq: Once | TRANSDERMAL | Status: DC
  Administered 2021-12-08: 1.5 mg via TRANSDERMAL

## 2021-12-08 MED ORDER — EPHEDRINE SULFATE-NACL 50-0.9 MG/10ML-% IV SOSY
PREFILLED_SYRINGE | INTRAVENOUS | Status: DC | PRN
  Administered 2021-12-08 (×2): 5 mg via INTRAVENOUS

## 2021-12-08 MED ORDER — BUPIVACAINE-EPINEPHRINE 0.25% -1:200000 IJ SOLN
INTRAMUSCULAR | Status: DC | PRN
  Administered 2021-12-08: 20 mL

## 2021-12-08 MED ORDER — ACETAMINOPHEN 10 MG/ML IV SOLN
INTRAVENOUS | Status: DC | PRN
  Administered 2021-12-08: 1000 mg via INTRAVENOUS

## 2021-12-08 MED ORDER — HYDROMORPHONE HCL 1 MG/ML IJ SOLN
0.5000 mg | INTRAMUSCULAR | Status: DC | PRN
  Administered 2021-12-08: 0.5 mg via INTRAVENOUS
  Filled 2021-12-08: qty 0.5

## 2021-12-08 MED ORDER — CEFAZOLIN SODIUM 1 G IJ SOLR
INTRAMUSCULAR | Status: AC
  Filled 2021-12-08: qty 20

## 2021-12-08 MED ORDER — ONDANSETRON HCL 4 MG PO TABS: 4.0000 mg | ORAL_TABLET | Freq: Four times a day (QID) | ORAL | Status: DC | PRN

## 2021-12-08 MED ORDER — CEFAZOLIN SODIUM-DEXTROSE 1-4 GM/50ML-% IV SOLN
1.0000 g | Freq: Three times a day (TID) | INTRAVENOUS | Status: AC
  Administered 2021-12-08 – 2021-12-09 (×2): 1 g via INTRAVENOUS
  Filled 2021-12-08 (×2): qty 50

## 2021-12-08 MED ORDER — PHENYLEPHRINE 80 MCG/ML (10ML) SYRINGE FOR IV PUSH (FOR BLOOD PRESSURE SUPPORT)
PREFILLED_SYRINGE | INTRAVENOUS | Status: DC | PRN
  Administered 2021-12-08 (×2): 80 ug via INTRAVENOUS
  Administered 2021-12-08: 240 ug via INTRAVENOUS
  Administered 2021-12-08 (×2): 160 ug via INTRAVENOUS
  Administered 2021-12-08: 80 ug via INTRAVENOUS

## 2021-12-08 MED ORDER — OXYCODONE-ACETAMINOPHEN 10-325 MG PO TABS: 1.0000 | ORAL_TABLET | Freq: Four times a day (QID) | ORAL | 0 refills | Status: AC | PRN

## 2021-12-08 MED ORDER — SURGIFLO WITH THROMBIN (HEMOSTATIC MATRIX KIT) OPTIME
TOPICAL | Status: DC | PRN
  Administered 2021-12-08: 1 via TOPICAL

## 2021-12-08 MED ORDER — GLIMEPIRIDE 2 MG PO TABS
6.0000 mg | ORAL_TABLET | Freq: Every day | ORAL | Status: DC
  Administered 2021-12-09: 6 mg via ORAL
  Filled 2021-12-08: qty 3

## 2021-12-08 MED ORDER — FENTANYL CITRATE (PF) 250 MCG/5ML IJ SOLN
INTRAMUSCULAR | Status: AC
  Filled 2021-12-08: qty 5

## 2021-12-08 MED ORDER — LISINOPRIL 10 MG PO TABS: 5.0000 mg | ORAL_TABLET | Freq: Every day | ORAL | Status: DC

## 2021-12-08 MED ORDER — BUPIVACAINE-EPINEPHRINE (PF) 0.25% -1:200000 IJ SOLN
INTRAMUSCULAR | Status: AC
  Filled 2021-12-08: qty 30

## 2021-12-08 MED ORDER — DOCUSATE SODIUM 100 MG PO CAPS
100.0000 mg | ORAL_CAPSULE | Freq: Two times a day (BID) | ORAL | Status: DC
  Administered 2021-12-08: 100 mg via ORAL
  Filled 2021-12-08: qty 1

## 2021-12-08 MED ORDER — VANCOMYCIN HCL 500 MG IV SOLR
INTRAVENOUS | Status: AC
  Filled 2021-12-08: qty 10

## 2021-12-08 SURGICAL SUPPLY — 59 items
ANCHOR SWIFT LOCK IMPLANT
BAG COUNTER SPONGE SURGICOUNT (BAG) IMPLANT
CANISTER SUCT 3000ML PPV (MISCELLANEOUS) ×1 IMPLANT
CLSR STERI-STRIP ANTIMIC 1/2X4 (GAUZE/BANDAGES/DRESSINGS) ×1 IMPLANT
CONTROLLER MAGNET PT W/MANUAL (MISCELLANEOUS) IMPLANT
CONTROLLER NEUROSTIM PATIENT (NEUROSURGERY SUPPLIES) IMPLANT
COVER MAYO STAND STRL (DRAPES) ×1 IMPLANT
COVER PROBE W GEL 5X96 (DRAPES) IMPLANT
COVER SURGICAL LIGHT HANDLE (MISCELLANEOUS) ×1 IMPLANT
DRAPE C-ARM 42X72 X-RAY (DRAPES) ×1 IMPLANT
DRAPE SURG 17X23 STRL (DRAPES) ×1 IMPLANT
DRAPE U-SHAPE 47X51 STRL (DRAPES) ×1 IMPLANT
DRSG OPSITE POSTOP 4X6 (GAUZE/BANDAGES/DRESSINGS) ×1 IMPLANT
DURAPREP 26ML APPLICATOR (WOUND CARE) ×1 IMPLANT
ELECT BLADE 4.0 EZ CLEAN MEGAD (MISCELLANEOUS) ×1
ELECT CAUTERY BLADE 6.4 (BLADE) IMPLANT
ELECT PENCIL ROCKER SW 15FT (MISCELLANEOUS) ×1 IMPLANT
ELECT REM PT RETURN 9FT ADLT (ELECTROSURGICAL) ×1
ELECTRODE BLDE 4.0 EZ CLN MEGD (MISCELLANEOUS) IMPLANT
ELECTRODE REM PT RTRN 9FT ADLT (ELECTROSURGICAL) ×1 IMPLANT
GENERATOR PROCLAIM PLUS 5 (Neuro Prosthesis/Implant) IMPLANT
GLOVE BIO SURGEON STRL SZ7 (GLOVE) ×1 IMPLANT
GLOVE BIOGEL PI IND STRL 7.0 (GLOVE) ×1 IMPLANT
GLOVE BIOGEL PI IND STRL 8.5 (GLOVE) ×1 IMPLANT
GLOVE SS N UNI LF 8.5 STRL (GLOVE) ×1 IMPLANT
GOWN STRL REUS W/ TWL LRG LVL3 (GOWN DISPOSABLE) ×2 IMPLANT
GOWN STRL REUS W/TWL 2XL LVL3 (GOWN DISPOSABLE) ×1 IMPLANT
GOWN STRL REUS W/TWL LRG LVL3 (GOWN DISPOSABLE) ×2
KIT BASIN OR (CUSTOM PROCEDURE TRAY) ×1 IMPLANT
KIT TURNOVER KIT B (KITS) ×1 IMPLANT
LEAD OCTRODE GEN 8CH 60CM (Lead) IMPLANT
NDL 22X1.5 STRL (OR ONLY) (MISCELLANEOUS) ×1 IMPLANT
NDL SPNL 18GX3.5 QUINCKE PK (NEEDLE) ×1 IMPLANT
NEEDLE 22X1.5 STRL (OR ONLY) (MISCELLANEOUS) ×1 IMPLANT
NEEDLE SPNL 18GX3.5 QUINCKE PK (NEEDLE) ×1 IMPLANT
NS IRRIG 1000ML POUR BTL (IV SOLUTION) ×1 IMPLANT
PACK LAMINECTOMY ORTHO (CUSTOM PROCEDURE TRAY) ×1 IMPLANT
PACK UNIVERSAL I (CUSTOM PROCEDURE TRAY) ×1 IMPLANT
PAD ARMBOARD 7.5X6 YLW CONV (MISCELLANEOUS) ×3 IMPLANT
SPATULA SILICONE BRAIN 10MM (MISCELLANEOUS) IMPLANT
SPONGE SURGIFOAM ABS GEL 100 (HEMOSTASIS) ×1 IMPLANT
SPONGE T-LAP 4X18 ~~LOC~~+RFID (SPONGE) IMPLANT
STAPLER VISISTAT 35W (STAPLE) IMPLANT
SURGIFLO W/THROMBIN 8M KIT (HEMOSTASIS) ×1 IMPLANT
SUT BONE WAX W31G (SUTURE) ×1 IMPLANT
SUT ETHIBOND 2 OS 4 DA (SUTURE) ×1 IMPLANT
SUT MNCRL AB 3-0 PS2 18 (SUTURE) ×2 IMPLANT
SUT MNCRL+ AB 3-0 CT1 36 (SUTURE) IMPLANT
SUT MONOCRYL AB 3-0 CT1 36IN (SUTURE) ×1
SUT VIC AB 1 CT1 18XCR BRD 8 (SUTURE) ×2 IMPLANT
SUT VIC AB 1 CT1 8-18 (SUTURE) ×2
SUT VIC AB 2-0 CT1 18 (SUTURE) ×1 IMPLANT
SYR BULB IRRIG 60ML STRL (SYRINGE) ×1 IMPLANT
SYR CONTROL 10ML LL (SYRINGE) ×1 IMPLANT
TOOL TUNNELING 20 (MISCELLANEOUS) IMPLANT
TOWEL GREEN STERILE (TOWEL DISPOSABLE) ×1 IMPLANT
TOWEL GREEN STERILE FF (TOWEL DISPOSABLE) ×1 IMPLANT
WATER STERILE IRR 1000ML POUR (IV SOLUTION) ×1 IMPLANT
YANKAUER SUCT BULB TIP NO VENT (SUCTIONS) ×1 IMPLANT

## 2021-12-08 NOTE — Discharge Instructions (Signed)

## 2021-12-08 NOTE — H&P (Signed)
History:  Sandra Ray is a very pleasant 76 year old man who had a previous L4-5 fusion several years ago. She initially doing well but has developed progressive low back buttock and neuropathic leg pain. While she does not have any focal neurological deficits she is having neuropathic pain. She did have a spinal cord stimulator trial which significantly improved her pain and quality of life.  As a result of the successful trial we will move forward with permanent implantation.  Past Medical History:  Diagnosis Date   Arthritis    Breast abscess 2000   right breast   Carpal tunnel syndrome    Complication of anesthesia    Degenerative disc disease    Diabetes mellitus type II    Eczema of hand    Hx of colonic polyps 12/02/2014   2004 - 2 diminutive polyps    Hyperlipidemia    Hypertension    Metatarsal deformity    Ovarian cyst 07/2017   left    PONV (postoperative nausea and vomiting)    Shingles 2000   Tubular adenoma of colon     Allergies  Allergen Reactions   Penicillins     Causes yeast infections Did it involve swelling of the face/tongue/throat, SOB, or low BP? No Did it involve sudden or severe rash/hives, skin peeling, or any reaction on the inside of your mouth or nose? No Did you need to seek medical attention at a hospital or doctor's office? No When did it last happen?  2018     If all above answers are "NO", may proceed with cephalosporin use.    Amoxicillin Other (See Comments)    Pt states it causes her a yeast infection  Causes yeast infections Did it involve swelling of the face/tongue/throat, SOB, or low BP? No Did it involve sudden or severe rash/hives, skin peeling, or any reaction on the inside of your mouth or nose? No Did you need to seek medical attention at a hospital or doctor's office? No When did it last happen?  2018     If all above answers are "NO", may proceed with cephalosporin use.   Erythromycin     Passed out while taking    No  current facility-administered medications on file prior to encounter.   Current Outpatient Medications on File Prior to Encounter  Medication Sig Dispense Refill   acetaminophen (TYLENOL) 650 MG CR tablet Take 1,300 mg by mouth 2 (two) times daily.     aspirin EC 81 MG tablet Take 81 mg by mouth daily. Swallow whole.     atorvastatin (LIPITOR) 40 MG tablet TAKE 1 TABLET BY MOUTH  DAILY AT 6 PM 80 tablet 3   calcium carbonate (TUMS - DOSED IN MG ELEMENTAL CALCIUM) 500 MG chewable tablet Chew 1-2 tablets by mouth daily as needed for indigestion or heartburn.     Cholecalciferol (VITAMIN D3) 125 MCG (5000 UT) CAPS Take 5,000 Units by mouth daily.     Coenzyme Q10 400 MG CAPS Take 400 mg by mouth daily.      Cranberry-Vitamin C (AZO CRANBERRY URINARY TRACT PO) Take 2 tablets by mouth daily as needed (uti symptoms).     cyanocobalamin (VITAMIN B12) 1000 MCG tablet Take 1,000 mcg by mouth daily.     Dapagliflozin-metFORMIN HCl ER (XIGDUO XR) 12-998 MG TB24 Take 1 tablet by mouth daily.     diclofenac Sodium (VOLTAREN) 1 % GEL Apply 1 Application topically 4 (four) times daily as needed (pain).  diphenhydramine-acetaminophen (TYLENOL PM) 25-500 MG TABS tablet Take 3 tablets by mouth at bedtime.     Dulaglutide (TRULICITY) 4.5 OH/6.0VP SOPN Inject 4.5 mg into the skin every Monday.     fluticasone (FLONASE) 50 MCG/ACT nasal spray Place 2 sprays into both nostrils daily. (Patient taking differently: Place 1 spray into both nostrils daily as needed for allergies.) 16 g 0   gabapentin (NEURONTIN) 300 MG capsule TAKE 1 CAPSULE BY MOUTH 3  TIMES DAILY 710 capsule 3   Garlic 6269 MG CAPS Take 1,000 mg by mouth daily.      glimepiride (AMARYL) 2 MG tablet TAKE 1 TABLET BY MOUTH  DAILY BEFORE BREAKFAST (Patient taking differently: Take 6 mg by mouth daily with breakfast.) 100 tablet 2   GLUCOSAMINE-CHONDROITIN-MSM PO Take 1 tablet by mouth 2 (two) times daily.     hydrochlorothiazide (HYDRODIURIL) 25 MG  tablet TAKE 1 TABLET BY MOUTH  DAILY 100 tablet 2   lisinopril (ZESTRIL) 5 MG tablet TAKE 1 TABLET BY MOUTH  DAILY 90 tablet 3   Magnesium 400 MG TABS Take 400 mg by mouth daily.     Multiple Vitamin (MULTIVITAMIN WITH MINERALS) TABS tablet Take 2 tablets by mouth daily.     Omega-3 Fatty Acids (FISH OIL PO) Take 1 capsule by mouth 2 (two) times daily.     ondansetron (ZOFRAN) 4 MG tablet Take 1 tablet (4 mg total) by mouth as needed for nausea or vomiting. 30 tablet 1   Polyethyl Glycol-Propyl Glycol (SYSTANE OP) Place 1 drop into both eyes daily as needed (dry eyes).     potassium chloride SA (KLOR-CON M) 20 MEQ tablet TAKE 1 TABLET BY MOUTH DAILY 100 tablet 2   Lancets (ONETOUCH ULTRASOFT) lancets Use as instructed once day.  E11.40 100 each 5   ONETOUCH VERIO test strip USE AS INSTRUCTED TO CHECK  BLOOD SUGAR TWICE DAILY 200 strip 3    Physical Exam: Clinical exam: Sandra Ray is a pleasant individual, who appears younger than their stated age.  She is alert and orientated 3.  No shortness of breath, chest pain.  Abdomen is soft and non-tender, negative loss of bowel and bladder control, no rebound tenderness.  Negative: skin lesions abrasions contusions  Peripheral pulses: 2+ dorsalis pedis/posterior tibialis pulses bilaterally. LE compartments are: Soft and nontender.  Gait pattern: Normal  Assistive devices: None  Neuro: 5/5 motor strength in the lower extremity bilaterally. Negative Babinski test, negative straight leg raise test, no clonus. 2+ deep tendon reflexes at the knee and Achilles. Normal sensation to light touch throughout. Patient does have occasional dysesthesias into the right lower extremity.  Musculoskeletal: Significant back pain radiating into the right thigh. No SI joint pain. Well-healed surgical scar from previous TLIF L4-S1.  Imaging: Solid fusion L4-S1 with no hardware complications. Positive adjacent segment degeneration L1-4. No  spondylolisthesis.  Thoracic MRI completed on 11/04/2021 at [facility] was [facility]. I agree with the radiology report essentially unremarkable thoracic MRI. No stenosis. No contraindication for the spinal cord stimulator placement.   Image: No results found.  A/P:Summary: Sandra Ray is a very pleasant 76 year old man who had a previous L4-5 fusion several years ago. She initially doing well but has developed progressive low back buttock and neuropathic leg pain. While she does not have any focal neurological deficits she is having neuropathic pain. She did have a spinal cord stimulator trial which significantly improved her pain and quality of life.  As a result of the successful trial we will move forward  with permanent implantation.  We have gone over the surgical procedure with the patient and her husband and all their questions were addressed.  Risks and benefits of surgery were discussed with the patient. These include: Infection, bleeding, death, stroke, paralysis, ongoing or worse pain, need for additional surgery, leak of spinal fluid, Failure of the battery requiring reoperation. Inability to place the paddle requiring the surgery to be aborted. Migration of the lead, failure to obtain results similar to the trial.

## 2021-12-08 NOTE — Brief Op Note (Signed)
12/08/2021  2:37 PM  PATIENT:  Sandra Ray  76 y.o. female  PRE-OPERATIVE DIAGNOSIS:  Failed back syndrome, status post spinal cord stimulator trial  POST-OPERATIVE DIAGNOSIS:  Failed back syndrome, status post spinal cord stimulator tria  PROCEDURE:  Procedure(s) with comments: PLACEMENT OF SPINAL CORD STIMULATOR (N/A) - 2.5 hrs 3 C-Bed  SURGEON:  Surgeon(s) and Role:    Melina Schools, MD - Primary  PHYSICIAN ASSISTANT:   ASSISTANTS: none   ANESTHESIA:   general  EBL:  50 mL   BLOOD ADMINISTERED:none  DRAINS: none   LOCAL MEDICATIONS USED:  MARCAINE     SPECIMEN:  No Specimen  DISPOSITION OF SPECIMEN:  N/A  COUNTS:  YES  TOURNIQUET:  * No tourniquets in log *  DICTATION: .Dragon Dictation  PLAN OF CARE: Admit for overnight observation  PATIENT DISPOSITION:  PACU - hemodynamically stable.

## 2021-12-08 NOTE — Anesthesia Procedure Notes (Signed)
Procedure Name: Intubation Date/Time: 12/08/2021 12:28 PM  Performed by: Colin Benton, CRNAPre-anesthesia Checklist: Patient identified, Emergency Drugs available, Suction available and Patient being monitored Patient Re-evaluated:Patient Re-evaluated prior to induction Oxygen Delivery Method: Circle system utilized Preoxygenation: Pre-oxygenation with 100% oxygen Induction Type: IV induction Ventilation: Mask ventilation without difficulty and Oral airway inserted - appropriate to patient size Laryngoscope Size: Miller and 3 Grade View: Grade I Tube type: Oral Tube size: 7.5 mm Number of attempts: 1 Airway Equipment and Method: Stylet and Oral airway Placement Confirmation: ETT inserted through vocal cords under direct vision, positive ETCO2 and breath sounds checked- equal and bilateral Secured at: 22 cm Tube secured with: Tape Dental Injury: Teeth and Oropharynx as per pre-operative assessment

## 2021-12-08 NOTE — Op Note (Signed)
OPERATIVE REPORT  DATE OF SURGERY: 12/08/2021  PATIENT NAME:  Sandra Ray MRN: [id number] DOB: [birth year]  PCP: Flossie Buffy, NP  PRE-OPERATIVE DIAGNOSIS: Failed back surgery.  Status post successful spinal cord stimulator trial  POST-OPERATIVE DIAGNOSIS: Same  PROCEDURE:   Permanent spinal cord stimulator implantation  SURGEON:  Melina Schools, MD  PHYSICIAN ASSISTANT: None  ANESTHESIA:   General  EBL: 50 ml   Complications: None  Implants: [employer] spine: Octrode lead x2.  Proclaim Plus 5 battery  BRIEF HISTORY: Sandra Ray is a 76 y.o. female who had a previous lumbar spinal fusion surgery but unfortunately continues to have chronic pain.  Ultimately she had a spinal cord stimulator trial and noted significant improvement in her pain and quality of life.  As a result of the positive trial she elected to move forward with a permanent implant.  All appropriate risks, benefits, and alternatives were discussed with the patient and consent was obtained  PROCEDURE DETAILS: Patient was brought into the operating room and was properly positioned on the operating room table.  After induction with general anesthesia the patient was endotracheally intubated.  A timeout was taken to confirm all important data: including patient, procedure, and the level. Teds, SCD's were applied.   Patient was turned prone onto the Wilson frame and all bony prominences were well-padded.  The back was then prepped and draped in a standard fashion.  Using fluoroscopy identified the T12 and T10 pedicles and marked out my midline incision.  Prior to surgery we did mark out the battery site.  Both of these incision sites were infiltrated with quarter percent Marcaine with epinephrine.  A midline thoracic incision was made and sharp dissection was carried out down to the deep fascia the fascia was incised and stripped the paraspinal muscles to expose the T10-11 interspinous process space and into the 11 and 12  interspinous process space.  Using fluoroscopy to confirm the T10-11 level.  I then resected the inferior third of the T10 spinous process and performed a laminotomy of T10 with a 2 mm Kerrison rongeur.  I then dissected through the ligamentum flavum creating a plane between it and the dorsal epidural fat.  I then removed this and expose the dorsal aspect of the thecal sac the leads were then obtained and gently passed along the dorsum to the level of T7.  I confirmed satisfactory position of the leads in both the AP and lateral planes.  The spinal cord stimulator rep was also present when she confirmed that the leads were properly positioned and were covering the same area that was obtained during the trial.  At this point the leads were then secured directly to the spinous process of T11.  I then rapidly through the interspinous process space at T11-12 so that they would be secured in position and we would minimize the potential for migration.  With the leads secured I then incised the battery site and created a pocket approximately 2 and half centimeters deep.  Using the submuscular passer I advanced the leads from the thoracic wound to the gluteal wound.  I then irrigated both wounds copiously with normal saline and made sure that hemostasis using proper electrocautery, Bovie, and Floseal.  The battery was then obtained and brought to the field.  At this point the bipolar and Bovie were disconnected and the door secured to the battery.  They were then tightened and torqued according manufacture standards.  The battery was then placed in the  pocket and the excess lead was wrapped and placed on the undersurface of the battery.  The battery was then secured to the deep fascia with two #1 Vicryl sutures.  At this point the leads were tested and they were functioning appropriately according to the representative from [employer].  At this point with the implant properly positioned and functioning I irrigated the  wounds copiously with normal saline.  The deep fascia both wounds were closed with interrupted #1 Vicryl sutures.  I did place vancomycin powder in the thoracic wound to decrease the potential risk for infection.  I then closed superficial with 2-0 Vicryl sutures, and 3-0 Monocryl was used to close the skin.  Steri-Strips and dry dressings were applied to the wounds.  Patient was then extubated and transferred the PACU without incident.  The end of the case all needle sponge counts were correct.  There were no adverse intraoperative events.  Melina Schools, MD 12/08/2021 2:29 PM

## 2021-12-08 NOTE — Anesthesia Preprocedure Evaluation (Addendum)
Anesthesia Evaluation  Patient identified by MRN, date of birth, ID band Patient awake    Reviewed: Allergy & Precautions, NPO status , Patient's Chart, lab work & pertinent test results  History of Anesthesia Complications (+) PONV and history of anesthetic complications  Airway Mallampati: II  TM Distance: >3 FB Neck ROM: Full    Dental  (+) Dental Advisory Given   Pulmonary neg pulmonary ROS,    breath sounds clear to auscultation       Cardiovascular hypertension, + Peripheral Vascular Disease   Rhythm:Regular Rate:Normal     Neuro/Psych  Neuromuscular disease    GI/Hepatic negative GI ROS,   Endo/Other  diabetes  Renal/GU negative Renal ROS     Musculoskeletal  (+) Arthritis ,   Abdominal   Peds  Hematology   Anesthesia Other Findings   Reproductive/Obstetrics                            Anesthesia Physical Anesthesia Plan  ASA: 3  Anesthesia Plan: General   Post-op Pain Management:    Induction: Intravenous  PONV Risk Score and Plan: 4 or greater and Ondansetron, Dexamethasone and Midazolam  Airway Management Planned: Oral ETT  Additional Equipment:   Intra-op Plan:   Post-operative Plan: Extubation in OR  Informed Consent: I have reviewed the patients History and Physical, chart, labs and discussed the procedure including the risks, benefits and alternatives for the proposed anesthesia with the patient or authorized representative who has indicated his/her understanding and acceptance.     Dental advisory given  Plan Discussed with: CRNA and Anesthesiologist  Anesthesia Plan Comments:         Anesthesia Quick Evaluation

## 2021-12-08 NOTE — Anesthesia Postprocedure Evaluation (Signed)
Anesthesia Post Note  Patient: Sandra Ray  Procedure(s) Performed: PLACEMENT OF SPINAL CORD STIMULATOR (Spine Thoracic)     Patient location during evaluation: PACU Anesthesia Type: General Level of consciousness: awake Pain management: pain level controlled Vital Signs Assessment: post-procedure vital signs reviewed and stable Respiratory status: spontaneous breathing Cardiovascular status: stable Postop Assessment: no apparent nausea or vomiting Anesthetic complications: no   No notable events documented.  Last Vitals:  Vitals:   12/08/21 1615 12/08/21 1630  BP: 135/61 138/63  Pulse: 77 77  Resp: 18 18  Temp: 36.7 C   SpO2: 93% 93%    Last Pain:  Vitals:   12/08/21 1530  TempSrc:   PainSc: Asleep                 Omri Bertran

## 2021-12-08 NOTE — Transfer of Care (Signed)
Immediate Anesthesia Transfer of Care Note  Patient: Sandra Ray  Procedure(s) Performed: PLACEMENT OF SPINAL CORD STIMULATOR (Spine Thoracic)  Patient Location: PACU  Anesthesia Type:General  Level of Consciousness: oriented, drowsy and patient cooperative  Airway & Oxygen Therapy: Patient Spontanous Breathing and Patient connected to nasal cannula oxygen  Post-op Assessment: Report given to RN and Post -op Vital signs reviewed and stable  Post vital signs: Reviewed  Last Vitals:  Vitals Value Taken Time  BP 158/66 12/08/21 1442  Temp    Pulse 88 12/08/21 1444  Resp 19 12/08/21 1444  SpO2 92 % 12/08/21 1444  Vitals shown include unvalidated device data.  Last Pain:  Vitals:   12/08/21 1027  TempSrc:   PainSc: 0-No pain         Complications: No notable events documented.

## 2021-12-09 DIAGNOSIS — E119 Type 2 diabetes mellitus without complications: Secondary | ICD-10-CM | POA: Diagnosis not present

## 2021-12-09 DIAGNOSIS — Z7985 Long-term (current) use of injectable non-insulin antidiabetic drugs: Secondary | ICD-10-CM | POA: Diagnosis not present

## 2021-12-09 DIAGNOSIS — I10 Essential (primary) hypertension: Secondary | ICD-10-CM | POA: Diagnosis not present

## 2021-12-09 DIAGNOSIS — M961 Postlaminectomy syndrome, not elsewhere classified: Secondary | ICD-10-CM | POA: Diagnosis not present

## 2021-12-09 DIAGNOSIS — Z79899 Other long term (current) drug therapy: Secondary | ICD-10-CM | POA: Diagnosis not present

## 2021-12-09 DIAGNOSIS — R2681 Unsteadiness on feet: Secondary | ICD-10-CM | POA: Diagnosis not present

## 2021-12-09 DIAGNOSIS — M545 Low back pain, unspecified: Secondary | ICD-10-CM | POA: Diagnosis not present

## 2021-12-09 DIAGNOSIS — Z7982 Long term (current) use of aspirin: Secondary | ICD-10-CM | POA: Diagnosis not present

## 2021-12-09 DIAGNOSIS — G8929 Other chronic pain: Secondary | ICD-10-CM | POA: Diagnosis not present

## 2021-12-09 DIAGNOSIS — Z7984 Long term (current) use of oral hypoglycemic drugs: Secondary | ICD-10-CM | POA: Diagnosis not present

## 2021-12-09 LAB — GLUCOSE, CAPILLARY: Glucose-Capillary: 155 mg/dL — ABNORMAL HIGH (ref 70–99)

## 2021-12-09 NOTE — Evaluation (Signed)
Occupational Therapy Evaluation Patient Details Name: Sandra Ray MRN: [id number] DOB: 22-Apr-1945 Today's Date: 12/09/2021   History of Present Illness 76 yo F s/p spine stimulator placement.  PMH includes: arthritis, HTN, prior back surgeries.   Clinical Impression   Patient admitted for the diagnosis above.  PTA she lives with her spouse, who is able to provide any assist needed.  She has had three prior back surgeries, has undergone both OT and PT, and has all the needed DME.  Patient has a good understanding of all precautions, all questions answered, and recommend follow up with MD as prescribed.  No further needs in the acute setting.       Recommendations for follow up therapy are one component of a multi-disciplinary discharge planning process, led by the attending physician.  Recommendations may be updated based on patient status, additional functional criteria and insurance authorization.   Follow Up Recommendations  No OT follow up    Assistance Recommended at Discharge Intermittent Supervision/Assistance  Patient can return home with the following Assist for transportation;Two people to help with bathing/dressing/bathroom;Assistance with cooking/housework    Functional Status Assessment  Patient has had a recent decline in their functional status and demonstrates the ability to make significant improvements in function in a reasonable and predictable amount of time.  Equipment Recommendations  None recommended by OT    Recommendations for Other Services       Precautions / Restrictions Precautions Precautions: Back Precaution Booklet Issued: Yes (comment) Precaution Comments: advised to limit bending, lifting and twisting. Restrictions Weight Bearing Restrictions: No      Mobility Bed Mobility Overal bed mobility: Needs Assistance Bed Mobility: Sidelying to Sit, Sit to Sidelying   Sidelying to sit: Min assist     Sit to sidelying: Min assist       Transfers Overall transfer level: Needs assistance Equipment used: Rolling walker (2 wheels) Transfers: Sit to/from Stand, Bed to chair/wheelchair/BSC Sit to Stand: Supervision                  Balance Overall balance assessment: Needs assistance Sitting-balance support: Feet supported Sitting balance-Leahy Scale: Fair     Standing balance support: Reliant on assistive device for balance Standing balance-Leahy Scale: Fair                             ADL either performed or assessed with clinical judgement   ADL                   Upper Body Dressing : Minimal assistance;Sitting   Lower Body Dressing: Minimal assistance;Sit to/from stand   Toilet Transfer: Supervision/safety;Rolling walker (2 wheels);Ambulation                   Vision Baseline Vision/History: 1 Wears glasses Patient Visual Report: No change from baseline       Perception Perception Perception: Not tested   Praxis Praxis Praxis: Not tested    Pertinent Vitals/Pain Pain Assessment Pain Assessment: Faces Faces Pain Scale: Hurts even more Pain Location: back and R thigh Pain Descriptors / Indicators: Aching, Burning, Discomfort, Grimacing, Guarding Pain Intervention(s): Monitored during session     Hand Dominance Right   Extremity/Trunk Assessment Upper Extremity Assessment Upper Extremity Assessment: Overall WFL for tasks assessed   Lower Extremity Assessment Lower Extremity Assessment: Overall WFL for tasks assessed   Cervical / Trunk Assessment Cervical / Trunk Assessment: Back Surgery   Communication Communication  Communication: No difficulties   Cognition Arousal/Alertness: Awake/alert Behavior During Therapy: Anxious Overall Cognitive Status: Within Functional Limits for tasks assessed                                       General Comments   VSS    Exercises     Shoulder Instructions      Home Living Family/patient  expects to be discharged to:: Private residence Living Arrangements: Spouse/significant other Available Help at Discharge: Family;Available 24 hours/day Type of Home: House Home Access: Ramped entrance     Home Layout: Multi-level;Able to live on main level with bedroom/bathroom Alternate Level Stairs-Number of Steps: 15 Alternate Level Stairs-Rails: Right Bathroom Shower/Tub: Occupational psychologist: Handicapped height Bathroom Accessibility: Yes How Accessible: Accessible via wheelchair Home Equipment: Rolling Walker (2 wheels);Grab bars - tub/shower;Hand held shower head;Shower seat          Prior Functioning/Environment Prior Level of Function : Independent/Modified Independent                        OT Problem List: Pain      OT Treatment/Interventions:      OT Goals(Current goals can be found in the care plan section) Acute Rehab OT Goals Patient Stated Goal: Return home OT Goal Formulation: With patient Time For Goal Achievement: 12/12/21 Potential to Achieve Goals: Good  OT Frequency:      Co-evaluation              AM-PAC OT "6 Clicks" Daily Activity     Outcome Measure Help from another person eating meals?: None Help from another person taking care of personal grooming?: None Help from another person toileting, which includes using toliet, bedpan, or urinal?: A Little Help from another person bathing (including washing, rinsing, drying)?: A Little Help from another person to put on and taking off regular upper body clothing?: A Little Help from another person to put on and taking off regular lower body clothing?: A Little 6 Click Score: 20   End of Session Equipment Utilized During Treatment: Rolling walker (2 wheels) Nurse Communication: Mobility status  Activity Tolerance: Patient limited by pain Patient left: in bed;with call bell/phone within reach  OT Visit Diagnosis: Unsteadiness on feet (R26.81);Pain                 Time: 0850-0910 OT Time Calculation (min): 20 min Charges:  OT General Charges $OT Visit: 1 Visit OT Evaluation $OT Eval Moderate Complexity: 1 Mod  12/09/2021  RP, OTR/L  Acute Rehabilitation Services  Office:  [phone]   Sandra Ray 12/09/2021, 9:19 AM

## 2021-12-09 NOTE — Discharge Summary (Signed)
Patient ID: Sandra Ray MRN: [id number] DOB/AGE: 76-25-1947 76 y.o.  Admit date: 12/08/2021 Discharge date: 12/09/2021  Admission Diagnoses:  Principal Problem:   Chronic pain   Discharge Diagnoses:  Principal Problem:   Chronic pain  status post Procedure(s): PLACEMENT OF SPINAL CORD STIMULATOR  Past Medical History:  Diagnosis Date   Arthritis    Breast abscess 2000   right breast   Carpal tunnel syndrome    Complication of anesthesia    Degenerative disc disease    Diabetes mellitus type II    Eczema of hand    Hx of colonic polyps 12/02/2014   2004 - 2 diminutive polyps    Hyperlipidemia    Hypertension    Metatarsal deformity    Ovarian cyst 07/2017   left    PONV (postoperative nausea and vomiting)    Shingles 2000   Tubular adenoma of colon     Surgeries: Procedure(s): PLACEMENT OF SPINAL CORD STIMULATOR on 12/08/2021   Consultants:   Discharged Condition: Improved  Hospital Course: Sandra Ray is an 76 y.o. female who was admitted 12/08/2021 for operative treatment of Chronic pain. Patient failed conservative treatments (please see the history and physical for the specifics) and had severe unremitting pain that affects sleep, daily activities and work/hobbies. After pre-op clearance, the patient was taken to the operating room on 12/08/2021 and underwent  Procedure(s): [place].    Patient was given perioperative antibiotics:  Anti-infectives (From admission, onward)    Start     Dose/Rate Route Frequency Ordered Stop   12/08/21 2000  ceFAZolin (ANCEF) IVPB 1 g/50 mL premix        1 g 100 mL/hr over 30 Minutes Intravenous Every 8 hours 12/08/21 1658 12/09/21 0237   12/08/21 1411  vancomycin (VANCOCIN) powder  Status:  Discontinued          As needed 12/08/21 1412 12/08/21 1438   12/08/21 1300  vancomycin (VANCOCIN) 1,000 mg in sodium chloride 0.9 % 250 mL IVPB  Status:  Discontinued        1,000 mg 250 mL/hr over 60  Minutes Intravenous To Surgery 12/08/21 1241 12/08/21 1249   12/08/21 1300  vancomycin (VANCOCIN) IVPB 1000 mg/200 mL premix  Status:  Discontinued        1,000 mg 200 mL/hr over 60 Minutes Intravenous To Surgery 12/08/21 1249 12/08/21 1256        Patient was given sequential compression devices and early ambulation to prevent DVT.   Patient benefited maximally from hospital stay and there were no complications. At the time of discharge, the patient was urinating/moving their bowels without difficulty, tolerating a regular diet, pain is controlled with oral pain medications and they have been cleared by PT/OT.   Recent vital signs: Patient Vitals for the past 24 hrs:  BP Temp Temp src Pulse Resp SpO2 Height Weight  12/09/21 0312 129/73 98 F (36.7 C) -- 79 18 92 % -- --  12/08/21 2301 114/64 97.6 F (36.4 C) Oral 72 18 93 % -- --  12/08/21 1856 (!) 116/54 97.9 F (36.6 C) -- 78 18 91 % -- --  12/08/21 1854 (!) 115/56 97.7 F (36.5 C) Oral 75 18 (!) 89 % -- --  12/08/21 1715 (!) 160/74 98.4 F (36.9 C) Oral 87 18 92 % -- --  12/08/21 1645 -- (!) 97.2 F (36.2 C) -- 76 17 95 % -- --  12/08/21 1630 138/63 -- -- 77 18 93 % -- --  12/08/21 1615 135/61 98.1 F (36.7 C) -- 77 18 93 % -- --  12/08/21 1600 135/67 -- -- 80 17 95 % -- --  12/08/21 1545 138/62 -- -- 76 16 96 % -- --  12/08/21 1530 139/63 -- -- 81 17 97 % -- --  12/08/21 1515 (!) 144/62 -- -- 79 19 94 % -- --  12/08/21 1500 (!) 146/62 -- -- 81 11 92 % -- --  12/08/21 1442 (!) 158/66 98.5 F (36.9 C) -- 88 -- 91 % -- --  12/08/21 1016 (!) 160/72 98.2 F (36.8 C) Oral 72 18 96 % 5' 8.5" (1.74 m) 78 kg     Recent laboratory studies: No results for input(s): "WBC", "HGB", "HCT", "PLT", "NA", "K", "CL", "CO2", "BUN", "CREATININE", "GLUCOSE", "INR", "CALCIUM" in the last 72 hours.  Invalid input(s): "PT", "2"   Discharge Medications:   Allergies as of 12/09/2021       Reactions   Penicillins    Causes yeast  infections Did it involve swelling of the face/tongue/throat, SOB, or low BP? No Did it involve sudden or severe rash/hives, skin peeling, or any reaction on the inside of your mouth or nose? No Did you need to seek medical attention at a hospital or doctor's office? No When did it last happen?  2018     If all above answers are "NO", may proceed with cephalosporin use. Tolerated cefazolin in 2020   Amoxicillin Other (See Comments)   Pt states it causes her a yeast infection  Causes yeast infections Did it involve swelling of the face/tongue/throat, SOB, or low BP? No Did it involve sudden or severe rash/hives, skin peeling, or any reaction on the inside of your mouth or nose? No Did you need to seek medical attention at a hospital or doctor's office? No When did it last happen?  2018     If all above answers are "NO", may proceed with cephalosporin use. Tolerated cefazolin in 2020   Erythromycin    Passed out while taking        Medication List     STOP taking these medications    aspirin EC 81 MG tablet   AZO CRANBERRY URINARY TRACT PO   Coenzyme Q10 400 MG Caps   diclofenac Sodium 1 % Gel Commonly known as: VOLTAREN   FISH OIL PO   Garlic 8527 MG Caps   GLUCOSAMINE-CHONDROITIN-MSM PO   Magnesium 400 MG Tabs   multivitamin with minerals Tabs tablet   potassium chloride SA 20 MEQ tablet Commonly known as: KLOR-CON M   SYSTANE OP   Vitamin D3 125 MCG (5000 UT) Caps       TAKE these medications    acetaminophen 650 MG CR tablet Commonly known as: TYLENOL Take 1,300 mg by mouth 2 (two) times daily.   atorvastatin 40 MG tablet Commonly known as: LIPITOR TAKE 1 TABLET BY MOUTH  DAILY AT 6 PM   calcium carbonate 500 MG chewable tablet Commonly known as: TUMS - dosed in mg elemental calcium Chew 1-2 tablets by mouth daily as needed for indigestion or heartburn.   cyanocobalamin 1000 MCG tablet Commonly known as: VITAMIN B12 Take 1,000 mcg by mouth  daily.   diphenhydramine-acetaminophen 25-500 MG Tabs tablet Commonly known as: TYLENOL PM Take 3 tablets by mouth at bedtime.   fenofibrate 145 MG tablet Commonly known as: Tricor Take 1 tablet (145 mg total) by mouth daily.   fluticasone 50 MCG/ACT nasal spray Commonly known as: FLONASE  Place 2 sprays into both nostrils daily. What changed:  how much to take when to take this reasons to take this   gabapentin 300 MG capsule Commonly known as: NEURONTIN TAKE 1 CAPSULE BY MOUTH 3  TIMES DAILY   glimepiride 2 MG tablet Commonly known as: AMARYL TAKE 1 TABLET BY MOUTH  DAILY BEFORE BREAKFAST What changed:  how much to take when to take this   hydrochlorothiazide 25 MG tablet Commonly known as: HYDRODIURIL TAKE 1 TABLET BY MOUTH  DAILY   lisinopril 5 MG tablet Commonly known as: ZESTRIL TAKE 1 TABLET BY MOUTH  DAILY   methocarbamol 500 MG tablet Commonly known as: ROBAXIN Take 1 tablet (500 mg total) by mouth every 8 (eight) hours as needed for up to 5 days for muscle spasms.   ondansetron 4 MG tablet Commonly known as: Zofran Take 1 tablet (4 mg total) by mouth as needed for nausea or vomiting. What changed: Another medication with the same name was added. Make sure you understand how and when to take each.   ondansetron 4 MG tablet Commonly known as: Zofran Take 1 tablet (4 mg total) by mouth every 8 (eight) hours as needed for nausea or vomiting. What changed: You were already taking a medication with the same name, and this prescription was added. Make sure you understand how and when to take each.   onetouch ultrasoft lancets Use as instructed once day.  E11.40   OneTouch Verio test strip Generic drug: glucose blood USE AS INSTRUCTED TO CHECK  BLOOD SUGAR TWICE DAILY   oxyCODONE-acetaminophen 10-325 MG tablet Commonly known as: Percocet Take 1 tablet by mouth every 6 (six) hours as needed for up to 5 days for pain.   Trulicity 4.5 UU/7.2ZD Sopn Generic  drug: Dulaglutide Inject 4.5 mg into the skin every Monday.   Xigduo XR 12-998 MG Tb24 Generic drug: Dapagliflozin-metFORMIN HCl ER Take 1 tablet by mouth daily.        Diagnostic Studies: DG THORACOLUMABAR SPINE  Result Date: 12/08/2021 CLINICAL DATA:  Stimulator placement EXAM: THORACOLUMBAR SPINE 1V COMPARISON:  None Available. FINDINGS: 2 intraoperative views demonstrate placement of a dorsal spinal stimulator terminating in the lower thoracic canal. Imaged vertebral body height and intervertebral disc height maintained. IMPRESSION: Intraoperative imaging of dorsal spinal stimulator placement. Electronically Signed   By: Abigail Miyamoto M.D.   On: 12/08/2021 14:17   DG C-Arm 1-60 Min-No Report  Result Date: 12/08/2021 Fluoroscopy was utilized by the requesting physician.  No radiographic interpretation.   DG C-Arm 1-60 Min-No Report  Result Date: 12/08/2021 Fluoroscopy was utilized by the requesting physician.  No radiographic interpretation.    Discharge Instructions     Incentive spirometry RT   Complete by: As directed         Follow-up Information     Melina Schools, MD. Schedule an appointment as soon as possible for a visit in 2 week(s).   Specialty: Orthopedic Surgery Why: If symptoms worsen, For wound re-check, For suture removal Contact information: [address] [place] [address] [phone]                 Discharge Plan:  discharge to home  Disposition: At time of discharge patient was doing quite well.  Her primary complaint was thoracic incision site pain.  She was neurologically intact, ambulating without difficulty.  She was tolerating a regular diet, voiding spontaneously, and had positive flatus.  Discharge instructions were provided as well as appropriate medications.  She will follow-up with me in 2 weeks for wound check.  If any issues or problems arise she knows to contact me and we will address them at that  time.    Signed: Dahlia Bailiff for Dr. Melina Schools [facility] [phone] 12/09/2021, 6:42 AM

## 2021-12-09 NOTE — Progress Notes (Signed)
Patient alert and oriented, voiding adequately, skin clean, dry and intact without evidence of skin break down, or symptoms of complications - no redness or edema noted, only slight tenderness at site.  Patient states pain is manageable at time of discharge. Patient has an appointment with MD in 2 weeks 

## 2021-12-09 NOTE — Progress Notes (Signed)
PT Cancellation Note and Discharge  Patient Details Name: LAIYLA SLAGEL MRN: [id number] DOB: 11-Dec-1945   Cancelled Treatment:    Reason Eval/Treat Not Completed: PT screened, no needs identified, will sign off. Discussed pt case with OT who reports pt is currently mobilizing at a supervision to min assist level and will have adequate assistance at home at d/c. Pt has had prior back surgeries, is familiar with precautions, and has all necessary DME at home. Pt does not require a formal PT evaluation at this time. PT signing off. If needs change, please reconsult.     ROSALENA MCCORRY 12/09/2021, 9:26 AM  Rolinda Roan, PT, DPT Acute Rehabilitation Services Secure Chat Preferred Office: [phone]

## 2021-12-11 ENCOUNTER — Encounter (HOSPITAL_COMMUNITY): Payer: Self-pay | Admitting: Orthopedic Surgery

## 2021-12-12 ENCOUNTER — Encounter: Payer: Self-pay | Admitting: Nurse Practitioner

## 2021-12-22 ENCOUNTER — Other Ambulatory Visit: Payer: Medicare Other

## 2021-12-26 ENCOUNTER — Ambulatory Visit: Payer: Medicare Other | Admitting: Endocrinology

## 2022-02-07 ENCOUNTER — Emergency Department (HOSPITAL_COMMUNITY): Payer: Medicare Other

## 2022-02-07 ENCOUNTER — Emergency Department (HOSPITAL_COMMUNITY)
Admission: EM | Admit: 2022-02-07 | Discharge: 2022-02-07 | Disposition: A | Discharge status: Home / Self Care | Payer: Medicare Other | Attending: Emergency Medicine | Admitting: Emergency Medicine

## 2022-02-07 ENCOUNTER — Other Ambulatory Visit: Payer: Self-pay

## 2022-02-07 DIAGNOSIS — E119 Type 2 diabetes mellitus without complications: Secondary | ICD-10-CM | POA: Insufficient documentation

## 2022-02-07 DIAGNOSIS — S3991XA Unspecified injury of abdomen, initial encounter: Secondary | ICD-10-CM | POA: Diagnosis not present

## 2022-02-07 DIAGNOSIS — S0990XA Unspecified injury of head, initial encounter: Secondary | ICD-10-CM | POA: Diagnosis not present

## 2022-02-07 DIAGNOSIS — Z7984 Long term (current) use of oral hypoglycemic drugs: Secondary | ICD-10-CM | POA: Insufficient documentation

## 2022-02-07 DIAGNOSIS — K429 Umbilical hernia without obstruction or gangrene: Secondary | ICD-10-CM | POA: Diagnosis not present

## 2022-02-07 DIAGNOSIS — Y939 Activity, unspecified: Secondary | ICD-10-CM | POA: Insufficient documentation

## 2022-02-07 DIAGNOSIS — I10 Essential (primary) hypertension: Secondary | ICD-10-CM | POA: Insufficient documentation

## 2022-02-07 DIAGNOSIS — Y929 Unspecified place or not applicable: Secondary | ICD-10-CM | POA: Diagnosis not present

## 2022-02-07 DIAGNOSIS — M47812 Spondylosis without myelopathy or radiculopathy, cervical region: Secondary | ICD-10-CM | POA: Diagnosis not present

## 2022-02-07 DIAGNOSIS — M1612 Unilateral primary osteoarthritis, left hip: Secondary | ICD-10-CM | POA: Diagnosis not present

## 2022-02-07 DIAGNOSIS — S199XXA Unspecified injury of neck, initial encounter: Secondary | ICD-10-CM | POA: Diagnosis not present

## 2022-02-07 DIAGNOSIS — I700 Atherosclerosis of aorta: Secondary | ICD-10-CM | POA: Diagnosis not present

## 2022-02-07 DIAGNOSIS — Y999 Unspecified external cause status: Secondary | ICD-10-CM | POA: Diagnosis not present

## 2022-02-07 DIAGNOSIS — S2242XA Multiple fractures of ribs, left side, initial encounter for closed fracture: Secondary | ICD-10-CM | POA: Insufficient documentation

## 2022-02-07 DIAGNOSIS — R1111 Vomiting without nausea: Secondary | ICD-10-CM | POA: Diagnosis not present

## 2022-02-07 DIAGNOSIS — K449 Diaphragmatic hernia without obstruction or gangrene: Secondary | ICD-10-CM | POA: Diagnosis not present

## 2022-02-07 DIAGNOSIS — Z043 Encounter for examination and observation following other accident: Secondary | ICD-10-CM | POA: Diagnosis not present

## 2022-02-07 DIAGNOSIS — M25531 Pain in right wrist: Secondary | ICD-10-CM | POA: Diagnosis present

## 2022-02-07 DIAGNOSIS — R222 Localized swelling, mass and lump, trunk: Secondary | ICD-10-CM | POA: Diagnosis not present

## 2022-02-07 DIAGNOSIS — Z79899 Other long term (current) drug therapy: Secondary | ICD-10-CM | POA: Diagnosis not present

## 2022-02-07 DIAGNOSIS — M2578 Osteophyte, vertebrae: Secondary | ICD-10-CM | POA: Diagnosis not present

## 2022-02-07 DIAGNOSIS — W010XXA Fall on same level from slipping, tripping and stumbling without subsequent striking against object, initial encounter: Secondary | ICD-10-CM | POA: Diagnosis not present

## 2022-02-07 DIAGNOSIS — W19XXXA Unspecified fall, initial encounter: Secondary | ICD-10-CM

## 2022-02-07 DIAGNOSIS — M545 Low back pain, unspecified: Secondary | ICD-10-CM | POA: Diagnosis not present

## 2022-02-07 DIAGNOSIS — K5730 Diverticulosis of large intestine without perforation or abscess without bleeding: Secondary | ICD-10-CM | POA: Diagnosis not present

## 2022-02-07 LAB — CBC WITH DIFFERENTIAL/PLATELET
Abs Immature Granulocytes: 0.12 10*3/uL — ABNORMAL HIGH (ref 0.00–0.07)
Basophils Absolute: 0.1 10*3/uL (ref 0.0–0.1)
Basophils Relative: 1 %
Eosinophils Absolute: 0.1 10*3/uL (ref 0.0–0.5)
Eosinophils Relative: 1 %
HCT: 43.5 % (ref 36.0–46.0)
Hemoglobin: 14.5 g/dL (ref 12.0–15.0)
Immature Granulocytes: 1 %
Lymphocytes Relative: 27 %
Lymphs Abs: 5.2 10*3/uL — ABNORMAL HIGH (ref 0.7–4.0)
MCH: 31 pg (ref 26.0–34.0)
MCHC: 33.3 g/dL (ref 30.0–36.0)
MCV: 92.9 fL (ref 80.0–100.0)
Monocytes Absolute: 1.8 10*3/uL — ABNORMAL HIGH (ref 0.1–1.0)
Monocytes Relative: 9 %
Neutro Abs: 11.9 10*3/uL — ABNORMAL HIGH (ref 1.7–7.7)
Neutrophils Relative %: 61 %
Platelets: 324 10*3/uL (ref 150–400)
RBC: 4.68 MIL/uL (ref 3.87–5.11)
RDW: 14 % (ref 11.5–15.5)
WBC: 19.3 10*3/uL — ABNORMAL HIGH (ref 4.0–10.5)
nRBC: 0 % (ref 0.0–0.2)

## 2022-02-07 LAB — COMPREHENSIVE METABOLIC PANEL
ALT: 15 U/L (ref 0–44)
AST: 27 U/L (ref 15–41)
Albumin: 4.4 g/dL (ref 3.5–5.0)
Alkaline Phosphatase: 54 U/L (ref 38–126)
Anion gap: 14 (ref 5–15)
BUN: 19 mg/dL (ref 8–23)
CO2: 24 mmol/L (ref 22–32)
Calcium: 10.5 mg/dL — ABNORMAL HIGH (ref 8.9–10.3)
Chloride: 100 mmol/L (ref 98–111)
Creatinine, Ser: 0.87 mg/dL (ref 0.44–1.00)
GFR, Estimated: >60 mL/min (ref >60)
Glucose, Bld: 202 mg/dL — ABNORMAL HIGH (ref 70–99)
Potassium: 4 mmol/L (ref 3.5–5.1)
Sodium: 138 mmol/L (ref 135–145)
Total Bilirubin: 0.7 mg/dL (ref 0.3–1.2)
Total Protein: 7.7 g/dL (ref 6.5–8.1)

## 2022-02-07 LAB — URINALYSIS, ROUTINE W REFLEX MICROSCOPIC
Bilirubin Urine: NEGATIVE
Glucose, UA: >=500 mg/dL — AB
Ketones, ur: 5 mg/dL — AB
Nitrite: NEGATIVE
Protein, ur: 100 mg/dL — AB
RBC / HPF: >50 RBC/hpf — ABNORMAL HIGH (ref 0–5)
Specific Gravity, Urine: 1.027 (ref 1.005–1.030)
WBC, UA: >50 WBC/hpf — ABNORMAL HIGH (ref 0–5)
pH: 5 (ref 5.0–8.0)

## 2022-02-07 LAB — CBG MONITORING, ED: Glucose-Capillary: 184 mg/dL — ABNORMAL HIGH (ref 70–99)

## 2022-02-07 MED ORDER — OXYCODONE-ACETAMINOPHEN 5-325 MG PO TABS: 1.0000 | ORAL_TABLET | Freq: Three times a day (TID) | ORAL | 0 refills | Status: DC | PRN

## 2022-02-07 MED ORDER — IOHEXOL 300 MG/ML  SOLN
100.0000 mL | Freq: Once | INTRAMUSCULAR | Status: AC | PRN
  Administered 2022-02-07: 100 mL via INTRAVENOUS

## 2022-02-07 MED ORDER — ONDANSETRON 4 MG PO TBDP
4.0000 mg | ORAL_TABLET | Freq: Once | ORAL | Status: AC
  Administered 2022-02-07: 4 mg via ORAL
  Filled 2022-02-07: qty 1

## 2022-02-07 NOTE — ED Triage Notes (Signed)
Patient present post fall at 0900 today in which we believe she landed on her knees and hit her head on the side rail of a truck. We are unsure of what caused the fall. Patient vomited during triage. She hs had a recent surgery in which she had a nerve stimulator place.

## 2022-02-07 NOTE — ED Provider Triage Note (Signed)
Emergency Medicine Provider Triage Evaluation Note  Sandra Ray , a 75 y.o. female  was evaluated in triage.  Pt complains of fall. Husband mentioned pt fell as she was stepping out of the car this AM.  She hits her head but no LOC.  She is actively vomiting.    Review of Systems  Positive: As above Negative: As above  Physical Exam  BP 107/64 (BP Location: Right Arm)   Pulse (!) 104   Temp (!) 97.4 F (36.3 C) (Oral)   Resp 16   Ht '5\' 8"'$  (1.727 m)   Wt 78 kg   SpO2 96%   BMI 26.15 kg/m  Gen:   Awake, no distress   Resp:  Normal effort  MSK:   Moves extremities without difficulty  Other:  Vomiting, looking uncomfortable  Medical Decision Making  Medically screening exam initiated at 12:58 PM.  Appropriate orders placed.  Sandra Ray was informed that the remainder of the evaluation will be completed by another provider, this initial triage assessment does not replace that evaluation, and the importance of remaining in the ED until their evaluation is complete.     Domenic Moras, PA-C 02/07/22 1259

## 2022-02-07 NOTE — ED Provider Notes (Signed)
[facility] DEPT Provider Note   CSN: [id number] Arrival date & time: 02/07/22  1209     History  No chief complaint on file.   Sandra Ray is a 76 y.o. female.  HPI Patient presents after fall.  Slipped this morning and fell.  Hitting right wrist pain and left-sided chest.  Hurts with a deep breath.  Worse with movements also.  Did hit head.  No loss conscious.  No neck pain.   Past Medical History:  Diagnosis Date   Arthritis    Breast abscess 2000   right breast   Carpal tunnel syndrome    Complication of anesthesia    Degenerative disc disease    Diabetes mellitus type II    Eczema of hand    Hx of colonic polyps 12/02/2014   2004 - 2 diminutive polyps    Hyperlipidemia    Hypertension    Metatarsal deformity    Ovarian cyst 07/2017   left    PONV (postoperative nausea and vomiting)    Shingles 2000   Tubular adenoma of colon     Home Medications Prior to Admission medications   Medication Sig Start Date End Date Taking? Authorizing Provider  oxyCODONE-acetaminophen (PERCOCET/ROXICET) 5-325 MG tablet Take 1-2 tablets by mouth every 8 (eight) hours as needed for severe pain. 02/07/22  Yes Davonna Belling, MD  acetaminophen (TYLENOL) 650 MG CR tablet Take 1,300 mg by mouth 2 (two) times daily.    [provider]  atorvastatin (LIPITOR) 40 MG tablet TAKE 1 TABLET BY MOUTH  DAILY AT 6 PM 11/04/21   Nche, Charlene Brooke, NP  calcium carbonate (TUMS - DOSED IN MG ELEMENTAL CALCIUM) 500 MG chewable tablet Chew 1-2 tablets by mouth daily as needed for indigestion or heartburn.    [provider]  cyanocobalamin (VITAMIN B12) 1000 MCG tablet Take 1,000 mcg by mouth daily.    [provider]  Dapagliflozin-metFORMIN HCl ER (XIGDUO XR) 12-998 MG TB24 Take 1 tablet by mouth daily.    [provider]  diphenhydramine-acetaminophen (TYLENOL PM) 25-500 MG TABS tablet Take 3 tablets by mouth at bedtime.     [provider]  Dulaglutide (TRULICITY) 4.5 YC/1.4GY SOPN Inject 4.5 mg into the skin every Monday.    [provider]  fenofibrate (TRICOR) 145 MG tablet Take 1 tablet (145 mg total) by mouth daily. 12/02/21   Nche, Charlene Brooke, NP  fluticasone (FLONASE) 50 MCG/ACT nasal spray Place 2 sprays into both nostrils daily. Patient taking differently: Place 1 spray into both nostrils daily as needed for allergies. 03/30/17   Nche, Charlene Brooke, NP  gabapentin (NEURONTIN) 300 MG capsule TAKE 1 CAPSULE BY MOUTH 3  TIMES DAILY 11/04/21   Nche, Charlene Brooke, NP  glimepiride (AMARYL) 2 MG tablet TAKE 1 TABLET BY MOUTH  DAILY BEFORE BREAKFAST Patient taking differently: Take 6 mg by mouth daily with breakfast. 07/28/21   Elayne Snare, MD  hydrochlorothiazide (HYDRODIURIL) 25 MG tablet TAKE 1 TABLET BY MOUTH  DAILY 09/14/21   Nche, Charlene Brooke, NP  Lancets (ONETOUCH ULTRASOFT) lancets Use as instructed once day.  E11.40 05/18/16   Roma Schanz R, DO  lisinopril (ZESTRIL) 5 MG tablet TAKE 1 TABLET BY MOUTH  DAILY 08/30/21   Nche, Charlene Brooke, NP  ondansetron (ZOFRAN) 4 MG tablet Take 1 tablet (4 mg total) by mouth as needed for nausea or vomiting. 02/12/20   Elayne Snare, MD  ondansetron (ZOFRAN) 4 MG tablet Take 1  tablet (4 mg total) by mouth every 8 (eight) hours as needed for nausea or vomiting. 12/08/21   Melina Schools, MD  [facility] VERIO test strip USE AS INSTRUCTED TO CHECK  BLOOD SUGAR TWICE DAILY 12/08/21   Elayne Snare, MD      Allergies    Penicillins, Amoxicillin, and Erythromycin    Review of Systems   Review of Systems  Physical Exam Updated Vital Signs BP 134/66 (BP Location: Right Arm)   Pulse 88   Temp (!) 97.5 F (36.4 C) (Oral)   Resp 15   Ht '5\' 8"'$  (1.727 m)   Wt 78 kg   SpO2 99%   BMI 26.15 kg/m  Physical Exam Vitals and nursing note reviewed.  HENT:     Head: Atraumatic.  Eyes:     Pupils: Pupils are equal, round, and reactive to light.   Cardiovascular:     Rate and Rhythm: Regular rhythm.  Pulmonary:     Breath sounds: No wheezing.     Comments: Moderate tenderness to left lateral chest wall. Chest:     Chest wall: Tenderness present.  Abdominal:     Tenderness: There is abdominal tenderness.     Comments: Left upper quadrant tenderness without rebound or guarding.  No hernia palpated.  Musculoskeletal:     Comments: Tenderness to right wrist with some abrasion and ecchymosis.  Good range of motion.  Neurological:     Mental Status: She is alert and oriented to person, place, and time.     ED Results / Procedures / Treatments   Labs (all labs ordered are listed, but only abnormal results are displayed) Labs Reviewed  COMPREHENSIVE METABOLIC PANEL - Abnormal; Notable for the following components:      Result Value   Glucose, Bld 202 (*)    Calcium 10.5 (*)    All other components within normal limits  CBC WITH DIFFERENTIAL/PLATELET - Abnormal; Notable for the following components:   WBC 19.3 (*)    Neutro Abs 11.9 (*)    Lymphs Abs 5.2 (*)    Monocytes Absolute 1.8 (*)    Abs Immature Granulocytes 0.12 (*)    All other components within normal limits  URINALYSIS, ROUTINE W REFLEX MICROSCOPIC - Abnormal; Notable for the following components:   APPearance CLOUDY (*)    Glucose, UA >=500 (*)    Hgb urine dipstick SMALL (*)    Ketones, ur 5 (*)    Protein, ur 100 (*)    Leukocytes,Ua LARGE (*)    RBC / HPF >50 (*)    WBC, UA >50 (*)    Bacteria, UA RARE (*)    Non Squamous Epithelial 0-5 (*)    All other components within normal limits  CBG MONITORING, ED - Abnormal; Notable for the following components:   Glucose-Capillary 184 (*)    All other components within normal limits    EKG None  Radiology DG Wrist Complete Right  Result Date: 02/07/2022 CLINICAL DATA:  Status post fall EXAM: RIGHT WRIST - COMPLETE 3+ VIEW COMPARISON:  Radiographs 09/22/2013 FINDINGS: No acute fracture or dislocation.  Chondrocalcinosis of the TFCC. Vascular calcifications. IMPRESSION: No acute fracture. Electronically Signed   By: Placido Sou M.D.   On: 02/07/2022 17:36   CT CHEST ABDOMEN PELVIS W CONTRAST  Result Date: 02/07/2022 CLINICAL DATA:  Fall EXAM: CT CHEST, ABDOMEN, AND PELVIS WITH CONTRAST TECHNIQUE: Multidetector CT imaging of the chest, abdomen and pelvis was performed following the standard protocol during bolus administration  of intravenous contrast. RADIATION DOSE REDUCTION: This exam was performed according to the departmental dose-optimization program which includes automated exposure control, adjustment of the mA and/or kV according to patient size and/or use of iterative reconstruction technique. CONTRAST:  167m OMNIPAQUE IOHEXOL 300 MG/ML  SOLN COMPARISON:  CT 10/16/2007 FINDINGS: CT CHEST FINDINGS Cardiovascular: Moderate aortic atherosclerosis. No aneurysm. Coronary vascular calcification. Normal cardiac size. No pericardial effusion. Dense mitral calcification. Mediastinum/Nodes: No suspicious thyroid nodule. No suspicious lymph nodes. Midline trachea. Esophagus shows small hiatal hernia Lungs/Pleura: Lungs are clear. No pleural effusion or pneumothorax. Musculoskeletal: Sternum is intact. Acute mildly displaced left eighth, ninth, tenth and eleventh lateral rib fractures. CT ABDOMEN PELVIS FINDINGS Hepatobiliary: No focal liver abnormality is seen. No gallstones, gallbladder wall thickening, or biliary dilatation. Pancreas: Unremarkable. No pancreatic ductal dilatation or surrounding inflammatory changes. Spleen: Normal in size without focal abnormality. Adrenals/Urinary Tract: Adrenal glands are normal. Mild cortical scarring right kidney. No hydronephrosis. The bladder is unremarkable Stomach/Bowel: Stomach is within normal limits. No evidence of bowel wall thickening, distention, or inflammatory changes. Diverticular disease of the colon. Vascular/Lymphatic: Advanced aortic atherosclerosis.  No aneurysm. No suspicious lymph nodes Reproductive: Status post hysterectomy. No adnexal masses. Other: Negative for pelvic effusion or free air. Small fat containing periumbilical hernia Musculoskeletal: Advanced degenerative changes of the lumbar spine. Posterior spinal hardware L4 through S1. Edema within the left gluteal subcutaneous fat. Lobulated mass within the subcutaneous soft tissues of the left flank and gluteal region, this measures 7.7 x 3.4 x 7.4 cm and would be consistent with a hematoma. Right posterior stimulator generator with ascending leads positioned in the posterior spinal canal from inferior T7 to T9. IMPRESSION: 1. No CT evidence for acute intrathoracic, intra-abdominal, or intrapelvic abnormality. 2. Acute mildly displaced left eighth through eleventh lateral rib fractures. No pneumothorax or pleural effusion. 3. Edema within the left gluteal subcutaneous fat with 7.7 cm lobulated mass in the subcutaneous soft tissues of the left flank and gluteal region consistent with a hematoma. 4. Diverticular disease of the colon without acute inflammatory process. 5. Aortic atherosclerosis. Aortic Atherosclerosis (ICD10-I70.0). Electronically Signed   By: KDonavan FoilM.D.   On: 02/07/2022 17:07   DG Knee 2 Views Left  Result Date: 02/07/2022 CLINICAL DATA:  Fall. EXAM: LEFT KNEE - 1-2 VIEW COMPARISON:  None Available. FINDINGS: No acute fracture or dislocation. No joint effusion. Joint spaces are preserved. Patellar marginal osteophytes. Chondrocalcinosis of the menisci. Vascular calcifications. IMPRESSION: 1. No acute osseous abnormality. Electronically Signed   By: WTitus DubinM.D.   On: 02/07/2022 14:00   DG Hip Unilat W or Wo Pelvis 2-3 Views Left  Result Date: 02/07/2022 CLINICAL DATA:  Fall. EXAM: DG HIP (WITH OR WITHOUT PELVIS) 2-3V LEFT COMPARISON:  None Available. FINDINGS: No acute fracture or dislocation. 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 Prior L4-S1  PLIF with severe adjacent segment degenerative disc disease at L3-L4. Spinal cord stimulator generator noted in the right flank. IMPRESSION: 1. No acute osseous abnormality. 2. 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 Electronically Signed   By: WTitus DubinM.D.   On: 02/07/2022 13:58   CT Cervical Spine Wo Contrast  Result Date: 02/07/2022 CLINICAL DATA:  Neck trauma (Age >= 65y) EXAM: CT CERVICAL SPINE WITHOUT CONTRAST TECHNIQUE: Multidetector CT imaging of the cervical spine was performed without intravenous contrast. Multiplanar CT image reconstructions were also generated. RADIATION DOSE REDUCTION: This exam was performed according to the departmental dose-optimization program which includes automated exposure control, adjustment  of the mA and/or kV according to patient size and/or use of iterative reconstruction technique. COMPARISON:  None Available. FINDINGS: Alignment: Normal. Skull base and vertebrae: No acute fracture. No primary bone lesion or focal pathologic process. Soft tissues and spinal canal: No prevertebral fluid or swelling. No visible canal hematoma. Disc levels: Disc space narrowing with marginal osteophytes identified at C6-7. There is also less severe degenerative changes at C5-6. Osteoarthritis identified at C1-C2. Facet joint degenerative changes identified at multiple levels and most significantly on the right at C3-4. Upper chest: Negative. IMPRESSION: Degenerative changes.  No acute traumatic abnormalities Electronically Signed   By: Sammie Bench M.D.   On: 02/07/2022 13:28   CT Head Wo Contrast  Result Date: 02/07/2022 CLINICAL DATA:  Head trauma, abnormal mental status (Age 80-64y) EXAM: CT HEAD WITHOUT CONTRAST TECHNIQUE: Contiguous axial images were obtained from the base of the skull through the vertex without intravenous contrast. RADIATION DOSE REDUCTION: This exam was performed according to the departmental dose-optimization program which  includes automated exposure control, adjustment of the mA and/or kV according to patient size and/or use of iterative reconstruction technique. COMPARISON:  11/23/2010 FINDINGS: Brain: There is periventricular white matter decreased attenuation consistent with small vessel ischemic changes. Gray-white differentiation is preserved. No acute intracranial hemorrhage, mass effect or shift. No hydrocephalus. Vascular: No hyperdense vessel or unexpected calcification. Skull: Normal. Negative for fracture or focal lesion. Sinuses/Orbits: No acute finding. IMPRESSION: Periventricular white matter changes consistent with chronic small vessel ischemia. No acute intracranial process identified. Electronically Signed   By: Sammie Bench M.D.   On: 02/07/2022 13:24    Procedures Procedures    Medications Ordered in ED Medications  ondansetron (ZOFRAN-ODT) disintegrating tablet 4 mg (4 mg Oral Given 02/07/22 1356)  iohexol (OMNIPAQUE) 300 MG/ML solution 100 mL (100 mLs Intravenous Contrast Given 02/07/22 1643)    ED Course/ Medical Decision Making/ A&P                           Medical Decision Making Amount and/or Complexity of Data Reviewed Radiology: ordered.  Risk Prescription drug management.   Patient with fall.  Hit head.  Pain in right wrist.  Also to knees.  Hit head.  However has left chest pain and abdominal pain.  Does blood work for rib fractures and potential pathology such as pneumothorax or ruptured spleen.  Will get CT imaging.  Lab work reassuring.  CT imaging does show rib fractures on the left.  No pneumothorax.  No splenic injury.  Will give pain medicines for home.  Will give incentive spirometer.  Patient states she does not want pain medicines here.  Follow-up with PCP as needed.        Final Clinical Impression(s) / ED Diagnoses Final diagnoses:  Fall, initial encounter  Closed fracture of multiple ribs of left side, initial encounter    Rx / DC Orders ED  Discharge Orders          Ordered    oxyCODONE-acetaminophen (PERCOCET/ROXICET) 5-325 MG tablet  Every 8 hours PRN        02/07/22 1736              Davonna Belling, MD 02/07/22 2313

## 2022-02-08 ENCOUNTER — Telehealth: Payer: Self-pay

## 2022-02-08 NOTE — Telephone Encounter (Signed)
Transition Care Management Follow-up Telephone Call Date of discharge and from where: 02/07/22 [facility] ED. Dx: Fall, multiple rib fractures left side. How have you been since you were released from the hospital? I've fallen and I'm having a time. Any questions or concerns? No  Items Reviewed: Did the pt receive and understand the discharge instructions provided? Yes  Medications obtained and verified? No  Other? No  Any new allergies since your discharge? No  Dietary orders reviewed? No Do you have support at home? Yes   Home Care and Equipment/Supplies: Were home health services ordered? not applicable If so, what is the name of the agency? N/a  Has the agency set up a time to come to the patient's home? not applicable Were any new equipment or medical supplies ordered?  No What is the name of the medical supply agency? N/a Were you able to get the supplies/equipment? not applicable Do you have any questions related to the use of the equipment or supplies? No  Functional Questionnaire: (I = Independent and D = Dependent) ADLs: I  Bathing/Dressing- I  Meal Prep- I  Eating- I  Maintaining continence- I  Transferring/Ambulation- I  Managing Meds- I  Follow up appointments reviewed:  PCP Hospital f/u appt confirmed? No  Scheduled to see n/a on n/a @ n/a. Pt wants to call back for an appt. She is in a lot of pain and can hardly move. She is unable to come into the office at this time, and Medicare does not cover virtual visits. [facility] f/u appt confirmed? No  Scheduled to see Dr. Rolena Infante on w/I 2 weeks @ not confimed. Are transportation arrangements needed? No  If their condition worsens, is the pt aware to call PCP or go to the Emergency Dept.? Yes Was the patient provided with contact information for the PCP's office or ED? Yes Was to pt encouraged to call back with questions or concerns? Yes  Angeline Slim, RN, BSN RN Clinical Supervisor [facility] [employer]

## 2022-02-09 ENCOUNTER — Other Ambulatory Visit: Payer: Self-pay | Admitting: Nurse Practitioner

## 2022-02-09 DIAGNOSIS — E785 Hyperlipidemia, unspecified: Secondary | ICD-10-CM

## 2022-02-09 NOTE — Telephone Encounter (Signed)
Chart supports Rx Last OV: 11/2021 Next OV: 02/2022

## 2022-02-13 ENCOUNTER — Telehealth: Payer: Self-pay

## 2022-02-13 NOTE — Telephone Encounter (Signed)
     Patient  visit on 11/28  at [place] you been able to follow up with your primary care physician? Yes   The patient was or was not able to obtain any needed medicine or equipment. Yes   Are there diet recommendations that you are having difficulty following? Yes   Patient expresses understanding of discharge instructions and education provided has no other needs at this time.  Yes      [place], Care Management  [phone] [address], [place], [place] [address] Phone: [phone] Email: [email or web address]

## 2022-02-17 DIAGNOSIS — M545 Low back pain, unspecified: Secondary | ICD-10-CM | POA: Diagnosis not present

## 2022-03-01 ENCOUNTER — Other Ambulatory Visit (INDEPENDENT_AMBULATORY_CARE_PROVIDER_SITE_OTHER): Payer: Medicare Other

## 2022-03-01 DIAGNOSIS — E785 Hyperlipidemia, unspecified: Secondary | ICD-10-CM | POA: Diagnosis not present

## 2022-03-02 NOTE — Progress Notes (Signed)
No charge-- unable to collect enough for  lab

## 2022-03-03 ENCOUNTER — Other Ambulatory Visit (INDEPENDENT_AMBULATORY_CARE_PROVIDER_SITE_OTHER): Payer: Medicare Other

## 2022-03-03 DIAGNOSIS — E785 Hyperlipidemia, unspecified: Secondary | ICD-10-CM | POA: Diagnosis not present

## 2022-03-03 LAB — LIPID PANEL
Cholesterol: 158 mg/dL (ref 0–200)
HDL: 55.1 mg/dL (ref >39.00)
LDL Cholesterol: 68 mg/dL (ref 0–99)
NonHDL: 103.2
Total CHOL/HDL Ratio: 3
Triglycerides: 178 mg/dL — ABNORMAL HIGH (ref 0.0–149.0)
VLDL: 35.6 mg/dL (ref 0.0–40.0)

## 2022-04-11 ENCOUNTER — Other Ambulatory Visit (INDEPENDENT_AMBULATORY_CARE_PROVIDER_SITE_OTHER): Payer: Medicare Other

## 2022-04-11 DIAGNOSIS — E1165 Type 2 diabetes mellitus with hyperglycemia: Secondary | ICD-10-CM

## 2022-04-11 LAB — BASIC METABOLIC PANEL
BUN: 22 mg/dL (ref 6–23)
CO2: 29 mEq/L (ref 19–32)
Calcium: 10.4 mg/dL (ref 8.4–10.5)
Chloride: 97 mEq/L (ref 96–112)
Creatinine, Ser: 0.81 mg/dL (ref 0.40–1.20)
GFR: 70.37 mL/min (ref >60.00)
Glucose, Bld: 113 mg/dL — ABNORMAL HIGH (ref 70–99)
Potassium: 4 mEq/L (ref 3.5–5.1)
Sodium: 137 mEq/L (ref 135–145)

## 2022-04-11 LAB — HEMOGLOBIN A1C: Hgb A1c MFr Bld: 6.7 % — ABNORMAL HIGH (ref 4.6–6.5)

## 2022-04-14 ENCOUNTER — Ambulatory Visit: Payer: Medicare Other | Admitting: Endocrinology

## 2022-04-14 VITALS — BP 146/72 | HR 76 | Ht 68.0 in | Wt 168.6 lb

## 2022-04-14 DIAGNOSIS — E1165 Type 2 diabetes mellitus with hyperglycemia: Secondary | ICD-10-CM

## 2022-04-14 NOTE — Progress Notes (Signed)
Patient ID: Sandra Ray, female   DOB: 1945/11/30, 77 y.o.   MRN: [id number]            Reason for Appointment: Follow-up    History of Present Illness:          Date of diagnosis of type 2 diabetes mellitus: 1996       Background history:   She had a high blood sugar on lab work which diagnosed her diabetes and she was most likely treated with metformin for a few years She has also taking glipizide for several years She was tried on Actos which was started because of significant weight gain. Over the last few years she has been taking either  Januvia or Tradjenta with her metformin in combination with fair control Her A1c has been below 8% only twice in the last 2-3 years  Recent history:    Non-insulin hypoglycemic drugs: Amaryl 4 mg in the morning, Trulicity  3 mg weekly,  Xigduo 12/998, 1 tablet daily  Current management, blood sugar patterns and problems identified:  Her A1c is 6.7 compared to 7.5 Previously as low as 6.5  She has not been seen in follow-up since 7/23  She was having issues with her back surgery and other intercurrent medical issues and appears to have lost weight Recently also she says her appetite is decreased  Her A1c is improved but she has a few high postprandial readings and not clear what foods are causing these She is only checking blood sugars infrequently and mostly in the morning Has taken all her medications and Trulicity as directed regularly She has tried to start a little walking Lab fasting glucose 113        Side effects from medications have been: Weight gain from Actos, nausea from high dose metformin   Glucose monitoring:  done <1 times a day         Glucometer: One Touch Verio     Blood Glucose readings from meter download and averages as follows for 30 days   PRE-MEAL Fasting Lunch Dinner Bedtime Overall  Glucose range: 95-116 181     Mean/median:     132   POST-MEAL PC Breakfast PC Lunch PC Dinner  Glucose range:  185  161  Mean/median:      Previously  PRE-MEAL Fasting Lunch Dinner Bedtime Overall  Glucose range: 107-157 165, 199   107-199  Mean/median: 126    140   POST-MEAL PC Breakfast PC Lunch PC Dinner  Glucose range: 147 171, 193 122-141  Mean/median:        Weight history: Previous range 200-254  Wt Readings from Last 3 Encounters:  04/14/22 168 lb 9.6 oz (76.5 kg)  02/07/22 171 lb 15.3 oz (78 kg)  12/08/21 172 lb (78 kg)    Glycemic control:   Lab Results  Component Value Date   HGBA1C 6.7 (H) 04/11/2022   HGBA1C 7.5 (H) 09/20/2021   HGBA1C 7.0 (A) 06/09/2021   Lab Results  Component Value Date   MICROALBUR 10.3 (H) 05/30/2021   LDLCALC 68 03/03/2022   CREATININE 0.81 04/11/2022   Lab Results  Component Value Date   MICRALBCREAT 7.6 05/30/2021    Lab Results  Component Value Date   FRUCTOSAMINE 237 03/28/2019   FRUCTOSAMINE 263 05/14/2018   FRUCTOSAMINE 328 (H) 07/07/2016      Allergies as of 04/14/2022       Reactions   Penicillins    Causes yeast infections Did it involve  swelling of the face/tongue/throat, SOB, or low BP? No Did it involve sudden or severe rash/hives, skin peeling, or any reaction on the inside of your mouth or nose? No Did you need to seek medical attention at a hospital or doctor's office? No When did it last happen?  2018     If all above answers are "NO", may proceed with cephalosporin use. Tolerated cefazolin in 2020   Amoxicillin Other (See Comments)   Pt states it causes her a yeast infection  Causes yeast infections Did it involve swelling of the face/tongue/throat, SOB, or low BP? No Did it involve sudden or severe rash/hives, skin peeling, or any reaction on the inside of your mouth or nose? No Did you need to seek medical attention at a hospital or doctor's office? No When did it last happen?  2018     If all above answers are "NO", may proceed with cephalosporin use. Tolerated cefazolin in 2020   Erythromycin     Passed out while taking        Medication List        Accurate as of April 14, 2022  3:09 PM. If you have any questions, ask your nurse or doctor.          acetaminophen 650 MG CR tablet Commonly known as: TYLENOL Take 1,300 mg by mouth 2 (two) times daily.   atorvastatin 40 MG tablet Commonly known as: LIPITOR TAKE 1 TABLET BY MOUTH  DAILY AT 6 PM   calcium carbonate 500 MG chewable tablet Commonly known as: TUMS - dosed in mg elemental calcium Chew 1-2 tablets by mouth daily as needed for indigestion or heartburn.   cyanocobalamin 1000 MCG tablet Commonly known as: VITAMIN B12 Take 1,000 mcg by mouth daily.   diphenhydramine-acetaminophen 25-500 MG Tabs tablet Commonly known as: TYLENOL PM Take 3 tablets by mouth at bedtime.   fenofibrate 145 MG tablet Commonly known as: TRICOR TAKE 1 TABLET BY MOUTH DAILY   fluticasone 50 MCG/ACT nasal spray Commonly known as: FLONASE Place 2 sprays into both nostrils daily. What changed:  how much to take when to take this reasons to take this   gabapentin 300 MG capsule Commonly known as: NEURONTIN TAKE 1 CAPSULE BY MOUTH 3  TIMES DAILY   glimepiride 2 MG tablet Commonly known as: AMARYL TAKE 1 TABLET BY MOUTH  DAILY BEFORE BREAKFAST What changed:  how much to take when to take this   hydrochlorothiazide 25 MG tablet Commonly known as: HYDRODIURIL TAKE 1 TABLET BY MOUTH  DAILY   lisinopril 5 MG tablet Commonly known as: ZESTRIL TAKE 1 TABLET BY MOUTH  DAILY   ondansetron 4 MG tablet Commonly known as: Zofran Take 1 tablet (4 mg total) by mouth as needed for nausea or vomiting.   ondansetron 4 MG tablet Commonly known as: Zofran Take 1 tablet (4 mg total) by mouth every 8 (eight) hours as needed for nausea or vomiting.   onetouch ultrasoft lancets Use as instructed once day.  E11.40   OneTouch Verio test strip Generic drug: glucose blood USE AS INSTRUCTED TO CHECK  BLOOD SUGAR TWICE DAILY    oxyCODONE-acetaminophen 5-325 MG tablet Commonly known as: PERCOCET/ROXICET Take 1-2 tablets by mouth every 8 (eight) hours as needed for severe pain.   Trulicity 4.5 [facility]/7.9GX Sopn Generic drug: Dulaglutide Inject 4.5 mg into the skin every Monday.   Xigduo XR 12-998 MG Tb24 Generic drug: Dapagliflozin Pro-metFORMIN ER Take 1 tablet by mouth daily.  Allergies:  Allergies  Allergen Reactions   Penicillins     Causes yeast infections Did it involve swelling of the face/tongue/throat, SOB, or low BP? No Did it involve sudden or severe rash/hives, skin peeling, or any reaction on the inside of your mouth or nose? No Did you need to seek medical attention at a hospital or doctor's office? No When did it last happen?  2018     If all above answers are "NO", may proceed with cephalosporin use.  Tolerated cefazolin in 2020   Amoxicillin Other (See Comments)    Pt states it causes her a yeast infection  Causes yeast infections Did it involve swelling of the face/tongue/throat, SOB, or low BP? No Did it involve sudden or severe rash/hives, skin peeling, or any reaction on the inside of your mouth or nose? No Did you need to seek medical attention at a hospital or doctor's office? No When did it last happen?  2018     If all above answers are "NO", may proceed with cephalosporin use.  Tolerated cefazolin in 2020   Erythromycin     Passed out while taking    Past Medical History:  Diagnosis Date   Arthritis    Breast abscess 2000   right breast   Carpal tunnel syndrome    Complication of anesthesia    Degenerative disc disease    Diabetes mellitus type II    Eczema of hand    Hx of colonic polyps 12/02/2014   2004 - 2 diminutive polyps    Hyperlipidemia    Hypertension    Metatarsal deformity    Ovarian cyst 07/2017   left    PONV (postoperative nausea and vomiting)    Shingles 2000   Tubular adenoma of colon     Past Surgical History:  Procedure  Laterality Date   ABDOMINAL HYSTERECTOMY  1981   menorrhagia   ABSCESS DRAINAGE Right    breast   APPENDECTOMY  1966   done with CSxn. Prophylactic   BREAST BIOPSY Left    carpel tunnel Right    CESAREAN SECTION     x2   COLONOSCOPY     LUMBAR LAMINECTOMY     4098,1191. 2011 L4,L5,S1   Right rotator cuff repair 09/2015     ROBOTIC ASSISTED SALPINGO OOPHERECTOMY Left 04/16/2018   Procedure: XI ROBOTIC ASSISTED  LEFT SALPINGO OOPHORECTOMY;  Surgeon: [name], MD;  Location: [facility];  Service: Gynecology;  Laterality: Left;   SPINAL CORD STIMULATOR INSERTION N/A 12/08/2021   Procedure: PLACEMENT OF SPINAL CORD STIMULATOR;  Surgeon: Melina Schools, MD;  Location: [facility];  Service: Orthopedics;  Laterality: N/A;  2.5 hrs 3 C-Bed   TUBAL LIGATION      Family History  Problem Relation Age of Onset   Hypertension Father    Diabetes Mother    Stroke Mother    Hypertension Mother    High Cholesterol Mother    Heart disease Mother    Diabetes Brother    Breast cancer Daughter 106       unsure if did genetic testing   Breast cancer Maternal Aunt    Breast cancer Sister 45   Colon polyps Brother     Social History:  reports that she has never smoked. She has never used smokeless tobacco. She reports that she does not drink alcohol and does not use drugs.   Review of Systems   Lipid history: Has had high triglycerides and LDL, LDL below 100  on atorvastatin 40 mg prescribed by her PCP  Also has hypertriglyceridemia treated with Lovaza, triglycerides as follows   Lab Results  Component Value Date   CHOL 158 03/03/2022   CHOL 151 11/30/2021   CHOL 127 05/30/2021   Lab Results  Component Value Date   HDL 55.10 03/03/2022   HDL 50.30 11/30/2021   HDL 52.50 05/30/2021   Lab Results  Component Value Date   LDLCALC 68 03/03/2022   LDLCALC 42 05/30/2021   LDLCALC 53 11/01/2020   Lab Results  Component Value Date   TRIG 178.0 (H) 03/03/2022   TRIG 229.0 (H) 11/30/2021    TRIG 160.0 (H) 05/30/2021   Lab Results  Component Value Date   CHOLHDL 3 03/03/2022   CHOLHDL 3 11/30/2021   CHOLHDL 2 05/30/2021   Lab Results  Component Value Date   LDLDIRECT 89.0 11/30/2021   LDLDIRECT 69.0 08/19/2018   LDLDIRECT 80.0 02/06/2018            Hypertension: controlled with 5 mg Lisinopril and 25 mg HCTZ Also on Farxiga Blood pressure recently variable  BP Readings from Last 3 Encounters:  04/14/22 (!) 146/72  02/07/22 134/66  12/09/21 119/64   Lab Results  Component Value Date   K 4.0 04/11/2022    Most recent eye exam was 12/22  She has longstanding neuropathy, 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 on the left side, taking gabapentin 300 mg 3 times daily from her PCP  Last foot exam 9/23   LABS:  Lab on 04/11/2022  Component Date Value Ref Range Status   Sodium 04/11/2022 137  135 - 145 mEq/L Final   Potassium 04/11/2022 4.0  3.5 - 5.1 mEq/L Final   Chloride 04/11/2022 97  96 - 112 mEq/L Final   CO2 04/11/2022 29  19 - 32 mEq/L Final   Glucose, Bld 04/11/2022 113 (H)  70 - 99 mg/dL Final   BUN 04/11/2022 22  6 - 23 mg/dL Final   Creatinine, Ser 04/11/2022 0.81  0.40 - 1.20 mg/dL Final   GFR 04/11/2022 70.37  >60.00 mL/min Final   Calculated using the CKD-EPI Creatinine Equation (2021)   Calcium 04/11/2022 10.4  8.4 - 10.5 mg/dL Final   Hgb A1c MFr Bld 04/11/2022 6.7 (H)  4.6 - 6.5 % Final   Glycemic Control Guidelines for People with Diabetes:Non Diabetic:  <6%Goal of Therapy: <7%Additional Action Suggested:  >8%     Physical Examination:  BP (!) 146/72 (BP Location: Left Arm, Patient Position: Sitting, Cuff Size: Normal)   Pulse 76   Ht '5\' 8"'$  (1.727 m)   Wt 168 lb 9.6 oz (76.5 kg)   SpO2 98%   BMI 25.64 kg/m     ASSESSMENT:  Diabetes type 2, non-insulin-dependent  See history of present illness for detailed discussion of current diabetes management, blood sugar patterns and problems identified   Her A1c is better at 6.7  She is on a 4  drug regimen including Trulicity 3 mg, Xigduo and 4 mg Amaryl  Blood sugars are relatively better controlled Although her A1c is good she has not had a few readings as high as 185 at home However checking blood sugars infrequently  PLAN:    No change in medications yet She needs to start checking blood sugars more consistently after meals She will watch carbohydrates and other foods that are making her sugars go up Gradually increase her walking for exercise   Patient Instructions  Check blood sugars on waking up 3 days  a week  Also check blood sugars about 2 hours after meals and do this after different meals by rotation  Recommended blood sugar levels on waking up are 90-130 and about 2 hours after meal is 130-160  Please bring your blood sugar monitor to each visit, thank you         Elayne Snare 04/14/2022, 3:09 PM   Note: This office note was prepared with Dragon voice recognition system technology. Any transcriptional errors that result from this process are unintentional.

## 2022-04-14 NOTE — Patient Instructions (Addendum)
Check blood sugars on waking up 3 days a week  Also check blood sugars about 2 hours after meals and do this after different meals by rotation  Recommended blood sugar levels on waking up are 90-130 and about 2 hours after meal is 130-160  Please bring your blood sugar monitor to each visit, thank you   

## 2022-05-09 ENCOUNTER — Other Ambulatory Visit: Payer: Self-pay | Admitting: Endocrinology

## 2022-05-31 ENCOUNTER — Other Ambulatory Visit: Payer: Self-pay | Admitting: Nurse Practitioner

## 2022-05-31 DIAGNOSIS — E785 Hyperlipidemia, unspecified: Secondary | ICD-10-CM

## 2022-05-31 DIAGNOSIS — I10 Essential (primary) hypertension: Secondary | ICD-10-CM

## 2022-06-20 ENCOUNTER — Other Ambulatory Visit: Payer: Self-pay | Admitting: Nurse Practitioner

## 2022-06-20 DIAGNOSIS — I10 Essential (primary) hypertension: Secondary | ICD-10-CM

## 2022-07-20 ENCOUNTER — Telehealth: Payer: Self-pay

## 2022-07-20 ENCOUNTER — Other Ambulatory Visit: Payer: Self-pay | Admitting: Nurse Practitioner

## 2022-07-20 DIAGNOSIS — E876 Hypokalemia: Secondary | ICD-10-CM

## 2022-07-20 NOTE — Telephone Encounter (Signed)
Patient states that [facility] is unable to get her Trulicity due to a shortage. She has not had any in 2-3 months. She is trying to watch her diet but her blood sugars are rising. She will need something else called in to her pharmacy for her to take to replace trulicity.

## 2022-07-25 MED ORDER — TIRZEPATIDE 2.5 MG/0.5ML ~~LOC~~ SOAJ: 2.5000 mg | SUBCUTANEOUS | 0 refills | Status: DC

## 2022-07-25 NOTE — Telephone Encounter (Signed)
Patient was advised and will contact

## 2022-08-10 ENCOUNTER — Other Ambulatory Visit: Payer: Self-pay | Admitting: Nurse Practitioner

## 2022-08-10 ENCOUNTER — Other Ambulatory Visit: Payer: Self-pay | Admitting: Endocrinology

## 2022-08-10 DIAGNOSIS — E785 Hyperlipidemia, unspecified: Secondary | ICD-10-CM

## 2022-08-10 DIAGNOSIS — E1151 Type 2 diabetes mellitus with diabetic peripheral angiopathy without gangrene: Secondary | ICD-10-CM

## 2022-08-11 ENCOUNTER — Other Ambulatory Visit (INDEPENDENT_AMBULATORY_CARE_PROVIDER_SITE_OTHER): Payer: Medicare Other

## 2022-08-11 DIAGNOSIS — E1165 Type 2 diabetes mellitus with hyperglycemia: Secondary | ICD-10-CM

## 2022-08-11 LAB — BASIC METABOLIC PANEL
BUN: 21 mg/dL (ref 6–23)
CO2: 25 mEq/L (ref 19–32)
Calcium: 10.1 mg/dL (ref 8.4–10.5)
Chloride: 100 mEq/L (ref 96–112)
Creatinine, Ser: 0.93 mg/dL (ref 0.40–1.20)
GFR: 59.48 mL/min — ABNORMAL LOW (ref >60.00)
Glucose, Bld: 190 mg/dL — ABNORMAL HIGH (ref 70–99)
Potassium: 4 mEq/L (ref 3.5–5.1)
Sodium: 135 mEq/L (ref 135–145)

## 2022-08-11 LAB — HEMOGLOBIN A1C: Hgb A1c MFr Bld: 9.5 % — ABNORMAL HIGH (ref 4.6–6.5)

## 2022-08-11 NOTE — Telephone Encounter (Signed)
Pt needs appointment

## 2022-08-15 ENCOUNTER — Encounter: Payer: Self-pay | Admitting: Endocrinology

## 2022-08-15 ENCOUNTER — Ambulatory Visit: Payer: Medicare Other | Admitting: Endocrinology

## 2022-08-15 VITALS — BP 138/70 | HR 69 | Ht 68.0 in | Wt 174.6 lb

## 2022-08-15 DIAGNOSIS — Z7985 Long-term (current) use of injectable non-insulin antidiabetic drugs: Secondary | ICD-10-CM

## 2022-08-15 DIAGNOSIS — Z794 Long term (current) use of insulin: Secondary | ICD-10-CM

## 2022-08-15 DIAGNOSIS — I10 Essential (primary) hypertension: Secondary | ICD-10-CM

## 2022-08-15 DIAGNOSIS — E1165 Type 2 diabetes mellitus with hyperglycemia: Secondary | ICD-10-CM

## 2022-08-15 MED ORDER — ONDANSETRON HCL 4 MG PO TABS: 4.0000 mg | ORAL_TABLET | Freq: Three times a day (TID) | ORAL | 1 refills | Status: DC | PRN

## 2022-08-15 MED ORDER — TIRZEPATIDE 5 MG/0.5ML ~~LOC~~ SOAJ
5.0000 mg | SUBCUTANEOUS | 0 refills | Status: DC
Start: 2022-08-15 — End: 2022-09-12

## 2022-08-15 NOTE — Patient Instructions (Addendum)
Check blood sugars on waking up 5-6 days a week  Also check blood sugars about 2 hours after meals and do this after different meals by rotation  Recommended blood sugar levels on waking up are 90-130 and about 2 hours after meal is 130-160  Please bring your blood sugar monitor to each visit, thank you  Tresiba 10 units in the morning daily until blood sugar is below 120 and then stop  Increase Mounjaro to 5 mg from the next prescription for 1 month and let us know before getting the next refill

## 2022-08-15 NOTE — Progress Notes (Signed)
Patient ID: Sandra Ray, female   DOB: August 27, 1945, 77 y.o.   MRN: [id number]            Reason for Appointment: Follow-up    History of Present Illness:          Date of diagnosis of type 2 diabetes mellitus: 1996       Background history:   She had a high blood sugar on lab work which diagnosed her diabetes and she was most likely treated with metformin for a few years She has also taking glipizide for several years She was tried on Actos which was started because of significant weight gain. Over the last few years she has been taking either  Januvia or Tradjenta with her metformin in combination with fair control Her A1c has been below 8% only twice in the last 2-3 years  Recent history:    Non-insulin hypoglycemic drugs: Amaryl 4 mg in the morning, Xigduo 12/998, 1 tablet daily  Current management, blood sugar patterns and problems identified:  Her A1c is 9.5 compared to 6.7 Previously as low as 6.5   She was not able to get her Trulicity supplies earlier this year because of medication shortage through her patient assistance program.  She finally called a couple of months later stating her blood sugar was starting to go up She has been taking Mounjaro 2.5 mg weekly and has taken 3 injections since this was prescribed last month Her A1c is markedly increased However she is checking blood sugars as before mostly in the mornings and only a few midday or afternoon She has had readings as high as 356 after meals However she does not complain of any excessive thirst, blurred vision but has a little dry mouth and some more weakness Morning blood sugars are still staying in the low 200 range recently She is only checking blood sugars infrequently and mostly in the morning She also has had intercurrent issues with a fall Lab fasting glucose 190       Side effects from medications have been: Weight gain from Actos, nausea from high dose metformin   Glucose monitoring:  done  <1 times a day         Glucometer: One Touch Verio     Blood Glucose readings from meter download and averages as follows for 30 days   PRE-MEAL Fasting Lunch Dinner Bedtime Overall  Glucose range: 157- 207 176-312 252  157-356  Mean/median:     235   POST-MEAL PC Breakfast PC Lunch PC Dinner  Glucose range:   287  Mean/median:      Previously  PRE-MEAL Fasting Lunch Dinner Bedtime Overall  Glucose range: 95-116 181     Mean/median:     132   POST-MEAL PC Breakfast PC Lunch PC Dinner  Glucose range: 185  161  Mean/median:       Weight history: Previous range 200-254  Wt Readings from Last 3 Encounters:  08/15/22 174 lb 9.6 oz (79.2 kg)  04/14/22 168 lb 9.6 oz (76.5 kg)  02/07/22 171 lb 15.3 oz (78 kg)    Glycemic control:   Lab Results  Component Value Date   HGBA1C 9.5 (H) 08/11/2022   HGBA1C 6.7 (H) 04/11/2022   HGBA1C 7.5 (H) 09/20/2021   Lab Results  Component Value Date   MICROALBUR 10.3 (H) 05/30/2021   LDLCALC 68 03/03/2022   CREATININE 0.93 08/11/2022   Lab Results  Component Value Date   MICRALBCREAT 7.6 05/30/2021  Lab Results  Component Value Date   FRUCTOSAMINE 237 03/28/2019   FRUCTOSAMINE 263 05/14/2018   FRUCTOSAMINE 328 (H) 07/07/2016      Allergies as of 08/15/2022       Reactions   Penicillins    Causes yeast infections Did it involve swelling of the face/tongue/throat, SOB, or low BP? No Did it involve sudden or severe rash/hives, skin peeling, or any reaction on the inside of your mouth or nose? No Did you need to seek medical attention at a hospital or doctor's office? No When did it last happen?  2018     If all above answers are "NO", may proceed with cephalosporin use. Tolerated cefazolin in 2020   Amoxicillin Other (See Comments)   Pt states it causes her a yeast infection  Causes yeast infections Did it involve swelling of the face/tongue/throat, SOB, or low BP? No Did it involve sudden or severe rash/hives, skin  peeling, or any reaction on the inside of your mouth or nose? No Did you need to seek medical attention at a hospital or doctor's office? No When did it last happen?  2018     If all above answers are "NO", may proceed with cephalosporin use. Tolerated cefazolin in 2020   Erythromycin    Passed out while taking        Medication List        Accurate as of August 15, 2022  2:32 PM. If you have any questions, ask your nurse or doctor.          STOP taking these medications    tirzepatide 2.5 MG/0.5ML Pen Commonly known as: MOUNJARO Replaced by: tirzepatide 5 MG/0.5ML Pen Stopped by: Reather Littler, MD   Trulicity 4.5 MG/0.5ML Sopn Generic drug: Dulaglutide Stopped by: Reather Littler, MD       TAKE these medications    acetaminophen 650 MG CR tablet Commonly known as: TYLENOL Take 1,300 mg by mouth 2 (two) times daily.   atorvastatin 40 MG tablet Commonly known as: LIPITOR TAKE 1 TABLET BY MOUTH  DAILY AT 6 PM   calcium carbonate 500 MG chewable tablet Commonly known as: TUMS - dosed in mg elemental calcium Chew 1-2 tablets by mouth daily as needed for indigestion or heartburn.   cyanocobalamin 1000 MCG tablet Commonly known as: VITAMIN B12 Take 1,000 mcg by mouth daily.   diphenhydramine-acetaminophen 25-500 MG Tabs tablet Commonly known as: TYLENOL PM Take 3 tablets by mouth at bedtime.   fenofibrate 145 MG tablet Commonly known as: TRICOR TAKE 1 TABLET BY MOUTH DAILY   fluticasone 50 MCG/ACT nasal spray Commonly known as: FLONASE Place 2 sprays into both nostrils daily. What changed:  how much to take when to take this reasons to take this   gabapentin 300 MG capsule Commonly known as: NEURONTIN TAKE 1 CAPSULE BY MOUTH 3  TIMES DAILY   glimepiride 2 MG tablet Commonly known as: AMARYL TAKE 1 TABLET BY MOUTH DAILY  BEFORE BREAKFAST   hydrochlorothiazide 25 MG tablet Commonly known as: HYDRODIURIL TAKE 1 TABLET BY MOUTH  DAILY   lisinopril 5 MG  tablet Commonly known as: ZESTRIL TAKE 1 TABLET BY MOUTH DAILY   ondansetron 4 MG tablet Commonly known as: Zofran Take 1 tablet (4 mg total) by mouth as needed for nausea or vomiting.   ondansetron 4 MG tablet Commonly known as: Zofran Take 1 tablet (4 mg total) by mouth every 8 (eight) hours as needed for nausea or vomiting.   onetouch  ultrasoft lancets Use as instructed once day.  E11.40   OneTouch Verio test strip Generic drug: glucose blood USE AS INSTRUCTED TO CHECK BLOOD SUGAR TWICE DAILY   oxyCODONE-acetaminophen 5-325 MG tablet Commonly known as: PERCOCET/ROXICET Take 1-2 tablets by mouth every 8 (eight) hours as needed for severe pain.   tirzepatide 5 MG/0.5ML Pen Commonly known as: MOUNJARO Inject 5 mg into the skin once a week. Replaces: tirzepatide 2.5 MG/0.5ML Pen Started by: Reather Littler, MD   Xigduo XR 12-998 MG Tb24 Generic drug: Dapagliflozin Pro-metFORMIN ER Take 1 tablet by mouth daily.        Allergies:  Allergies  Allergen Reactions   Penicillins     Causes yeast infections Did it involve swelling of the face/tongue/throat, SOB, or low BP? No Did it involve sudden or severe rash/hives, skin peeling, or any reaction on the inside of your mouth or nose? No Did you need to seek medical attention at a hospital or doctor's office? No When did it last happen?  2018     If all above answers are "NO", may proceed with cephalosporin use.  Tolerated cefazolin in 2020   Amoxicillin Other (See Comments)    Pt states it causes her a yeast infection  Causes yeast infections Did it involve swelling of the face/tongue/throat, SOB, or low BP? No Did it involve sudden or severe rash/hives, skin peeling, or any reaction on the inside of your mouth or nose? No Did you need to seek medical attention at a hospital or doctor's office? No When did it last happen?  2018     If all above answers are "NO", may proceed with cephalosporin use.  Tolerated cefazolin in  2020   Erythromycin     Passed out while taking    Past Medical History:  Diagnosis Date   Arthritis    Breast abscess 2000   right breast   Carpal tunnel syndrome    Complication of anesthesia    Degenerative disc disease    Diabetes mellitus type II    Eczema of hand    Hx of colonic polyps 12/02/2014   2004 - 2 diminutive polyps    Hyperlipidemia    Hypertension    Metatarsal deformity    Ovarian cyst 07/2017   left    PONV (postoperative nausea and vomiting)    Shingles 2000   Tubular adenoma of colon     Past Surgical History:  Procedure Laterality Date   ABDOMINAL HYSTERECTOMY  1981   menorrhagia   ABSCESS DRAINAGE Right    breast   APPENDECTOMY  1966   done with CSxn. Prophylactic   BREAST BIOPSY Left    carpel tunnel Right    CESAREAN SECTION     x2   COLONOSCOPY     LUMBAR LAMINECTOMY     1610,9604. 2011 L4,L5,S1   Right rotator cuff repair 09/2015     ROBOTIC ASSISTED SALPINGO OOPHERECTOMY Left 04/16/2018   Procedure: XI ROBOTIC ASSISTED  LEFT SALPINGO OOPHORECTOMY;  Surgeon: Shonna Chock, MD;  Location: [facility];  Service: Gynecology;  Laterality: Left;   SPINAL CORD STIMULATOR INSERTION N/A 12/08/2021   Procedure: PLACEMENT OF SPINAL CORD STIMULATOR;  Surgeon: Venita Lick, MD;  Location: [facility];  Service: Orthopedics;  Laterality: N/A;  2.5 hrs 3 C-Bed   TUBAL LIGATION      Family History  Problem Relation Age of Onset   Hypertension Father    Diabetes Mother    Stroke Mother  Hypertension Mother    High Cholesterol Mother    Heart disease Mother    Diabetes Brother    Breast cancer Daughter 2       unsure if did genetic testing   Breast cancer Maternal Aunt    Breast cancer Sister 71   Colon polyps Brother     Social History:  reports that she has never smoked. She has never used smokeless tobacco. She reports that she does not drink alcohol and does not use drugs.   Review of Systems   Lipid history: Has had high triglycerides  and LDL, LDL below 100 on atorvastatin 40 mg prescribed by her PCP  Also has hypertriglyceridemia treated with Lovaza, triglycerides as follows   Lab Results  Component Value Date   CHOL 158 03/03/2022   CHOL 151 11/30/2021   CHOL 127 05/30/2021   Lab Results  Component Value Date   HDL 55.10 03/03/2022   HDL 50.30 11/30/2021   HDL 52.50 05/30/2021   Lab Results  Component Value Date   LDLCALC 68 03/03/2022   LDLCALC 42 05/30/2021   LDLCALC 53 11/01/2020   Lab Results  Component Value Date   TRIG 178.0 (H) 03/03/2022   TRIG 229.0 (H) 11/30/2021   TRIG 160.0 (H) 05/30/2021   Lab Results  Component Value Date   CHOLHDL 3 03/03/2022   CHOLHDL 3 11/30/2021   CHOLHDL 2 05/30/2021   Lab Results  Component Value Date   LDLDIRECT 89.0 11/30/2021   LDLDIRECT 69.0 08/19/2018   LDLDIRECT 80.0 02/06/2018            Hypertension: controlled with 5 mg Lisinopril and 25 mg HCTZ Also on Farxiga Blood pressure recently better  BP Readings from Last 3 Encounters:  08/15/22 138/70  04/14/22 (!) 146/72  02/07/22 134/66   Lab Results  Component Value Date   K 4.0 08/11/2022    Most recent eye exam was 12/22  She has longstanding neuropathy, 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 on the left side, taking gabapentin 300 mg 3 times daily from her PCP  Last foot exam 9/23  She is still complaining of some left rib pain after her fall a few months ago Had back surgery 9/23   LABS:  Lab on 08/11/2022  Component Date Value Ref Range Status   Hgb A1c MFr Bld 08/11/2022 9.5 (H)  4.6 - 6.5 % Final   Glycemic Control Guidelines for People with Diabetes:Non Diabetic:  <6%Goal of Therapy: <7%Additional Action Suggested:  >8%    Sodium 08/11/2022 135  135 - 145 mEq/L Final   Potassium 08/11/2022 4.0  3.5 - 5.1 mEq/L Final   Chloride 08/11/2022 100  96 - 112 mEq/L Final   CO2 08/11/2022 25  19 - 32 mEq/L Final   Glucose, Bld 08/11/2022 190 (H)  70 - 99 mg/dL Final   BUN 16/12/9602 21  6 - 23  mg/dL Final   Creatinine, Ser 08/11/2022 0.93  0.40 - 1.20 mg/dL Final   GFR 54/11/8117 59.48 (L)  >60.00 mL/min Final   Calculated using the CKD-EPI Creatinine Equation (2021)   Calcium 08/11/2022 10.1  8.4 - 10.5 mg/dL Final    Physical Examination:  BP 138/70   Pulse 69   Ht 5\' 8"  (1.727 m)   Wt 174 lb 9.6 oz (79.2 kg)   SpO2 99%   BMI 26.55 kg/m     ASSESSMENT:  Diabetes type 2, non-insulin-dependent  See history of present illness for detailed discussion of current diabetes  management, blood sugar patterns and problems identified   Her A1c is much higher at 9.5  She had been on a 4 drug regimen including Trulicity 3 mg, Xigduo and 4 mg Amaryl  Blood sugars are totally out of control with her not getting Trulicity for the last 3 or 4 months because of lack of supply and she did not let us know in time She is now able to get the [facility] at the pharmacy although currently she is able to barely afford it  She is tolerating it well but her 2.5 mg dose is not therapeutic  Blood sugars are significantly high especially nonfasting She has not changed her diet and has not had any steroids   Hypertension: Recently has somewhat variable blood pressure but generally controlled, will continue to follow  PLAN:    Since she has 1 more injection of Mounjaro she will take this and then go up to 5 mg weekly for 1 month and subsequently 10 mg Reminded her to start checking blood sugars more regularly including after meals She will temporarily need to start basal insulin to help glucose toxicity and relieve some of her symptoms  Discussed how basal insulin works, timing of injection, dosage, injection sites.    She will start with 10 units [place] given with a sample pen and showed her how to use this Sample of pen needles also given If she continues to have higher readings she will let us know but may be able to stop this and blood sugars are below 120 She will try to also be  consistent with her diet and walk as tolerated  Follow-up in 6 weeks   Patient Instructions  Check blood sugars on waking up 5-6 days a week  Also check blood sugars about 2 hours after meals and do this after different meals by rotation  Recommended blood sugar levels on waking up are 90-130 and about 2 hours after meal is 130-160  Please bring your blood sugar monitor to each visit, thank you  Tresiba 10 units in the morning daily until blood sugar is below 120 and then stop  Increase Mounjaro to 5 mg from the next prescription for 1 month and let us know before getting the next refill   Total visit time for evaluation management and counseling = 30 minutes  Reather Littler 08/15/2022, 2:32 PM   Note: This office note was prepared with Dragon voice recognition system technology. Any transcriptional errors that result from this process are unintentional.

## 2022-09-11 ENCOUNTER — Telehealth: Payer: Self-pay | Admitting: Endocrinology

## 2022-09-11 DIAGNOSIS — E1165 Type 2 diabetes mellitus with hyperglycemia: Secondary | ICD-10-CM

## 2022-09-11 NOTE — Telephone Encounter (Signed)
Dr. Kumar please advise

## 2022-09-11 NOTE — Telephone Encounter (Signed)
Patient advising she is down to 1 mounjoro needle she is advising that Dr. Lucianne Muss told her to call when  she needed more. Please advise

## 2022-09-12 MED ORDER — TIRZEPATIDE 5 MG/0.5ML ~~LOC~~ SOAJ
10.0000 mg | SUBCUTANEOUS | 3 refills | Status: DC
Start: 2022-09-12 — End: 2022-09-26

## 2022-09-12 NOTE — Telephone Encounter (Signed)
Sandra Ray is aware and refill has been sent to [facility]

## 2022-09-14 ENCOUNTER — Other Ambulatory Visit: Payer: Self-pay | Admitting: Nurse Practitioner

## 2022-09-14 DIAGNOSIS — E785 Hyperlipidemia, unspecified: Secondary | ICD-10-CM

## 2022-09-14 DIAGNOSIS — E1151 Type 2 diabetes mellitus with diabetic peripheral angiopathy without gangrene: Secondary | ICD-10-CM

## 2022-09-18 ENCOUNTER — Other Ambulatory Visit: Payer: Self-pay

## 2022-09-18 ENCOUNTER — Telehealth: Payer: Self-pay

## 2022-09-18 DIAGNOSIS — E1165 Type 2 diabetes mellitus with hyperglycemia: Secondary | ICD-10-CM

## 2022-09-18 MED ORDER — TIRZEPATIDE 10 MG/0.5ML ~~LOC~~ SOAJ
10.0000 mg | SUBCUTANEOUS | 3 refills | Status: DC
Start: 2022-09-18 — End: 2023-04-30

## 2022-09-18 NOTE — Telephone Encounter (Signed)
Patient rx was refilled as 5 mg instead of 10 mg weekly by pharmacy. Patient called in to have medication sent. Medication sent to [facility]

## 2022-09-26 ENCOUNTER — Encounter: Payer: Self-pay | Admitting: Endocrinology

## 2022-09-26 ENCOUNTER — Ambulatory Visit: Payer: Medicare Other | Admitting: Endocrinology

## 2022-09-26 VITALS — BP 128/82 | HR 74 | Ht 68.0 in | Wt 175.0 lb

## 2022-09-26 DIAGNOSIS — E1165 Type 2 diabetes mellitus with hyperglycemia: Secondary | ICD-10-CM

## 2022-09-26 DIAGNOSIS — Z7985 Long-term (current) use of injectable non-insulin antidiabetic drugs: Secondary | ICD-10-CM | POA: Diagnosis not present

## 2022-09-26 NOTE — Progress Notes (Unsigned)
Patient ID: Sandra Ray, female   DOB: June 09, 1945, 77 y.o.   MRN: [id number]            Reason for Appointment: Follow-up    History of Present Illness:          Date of diagnosis of type 2 diabetes mellitus: 1996       Background history:   She had a high blood sugar on lab work which diagnosed her diabetes and she was most likely treated with metformin for a few years She has also taking glipizide for several years She was tried on Actos which was started because of significant weight gain. Over the last few years she has been taking either  Januvia or Tradjenta with her metformin in combination with fair control Her A1c has been below 8% only twice in the last 2-3 years  Recent history:    Non-insulin hypoglycemic drugs: Amaryl 4 mg in the morning, Xigduo 12/998, 1 tablet daily, Mounjaro 5 mg weekly Insulin regimen: None  Current management, blood sugar patterns and problems identified:  Her A1c is last 9.5 compared to 6.7 Previously as low as 6.5   She was started on basal insulin with a sample when her A1c was 9.5 and blood sugars as high as 356 She has been taking Mounjaro 5 mg weekly recently and overall blood sugars have improved although still not consistently at target About a week or so ago she had blood sugars as low as 113 in the morning and she stopped her insulin Blood sugars are now trending higher in the 130s and 140s fasting but unable to verify what her blood sugars are nonfasting Her weight is up only 1 pound  She is currently able to get her Mounjaro without difficulty and is affording it, also taking her other medications as before She is generally trying to watch diet with exceptions       Side effects from medications have been: Weight gain from Actos, nausea from high dose metformin   Glucose monitoring:  done <1 times a day         Glucometer: One Touch Verio     Blood Glucose readings from meter download and averages as follows for 30  days   PRE-MEAL Fasting Lunch Dinner Bedtime Overall  Glucose range: 113-161 193     Mean/median:     142   POST-MEAL PC Breakfast PC Lunch PC Dinner  Glucose range:   154  Mean/median:       Prior:  PRE-MEAL Fasting Lunch Dinner Bedtime Overall  Glucose range: 157- 207 176-312 252  157-356  Mean/median:     235   POST-MEAL PC Breakfast PC Lunch PC Dinner  Glucose range:   287  Mean/median:        Weight history: Previous range 200-254  Wt Readings from Last 3 Encounters:  09/26/22 175 lb (79.4 kg)  08/15/22 174 lb 9.6 oz (79.2 kg)  04/14/22 168 lb 9.6 oz (76.5 kg)    Glycemic control:   Lab Results  Component Value Date   HGBA1C 9.5 (H) 08/11/2022   HGBA1C 6.7 (H) 04/11/2022   HGBA1C 7.5 (H) 09/20/2021   Lab Results  Component Value Date   MICROALBUR 10.3 (H) 05/30/2021   LDLCALC 68 03/03/2022   CREATININE 0.93 08/11/2022   Lab Results  Component Value Date   MICRALBCREAT 7.6 05/30/2021    Lab Results  Component Value Date   FRUCTOSAMINE 237 03/28/2019   FRUCTOSAMINE 263 05/14/2018  FRUCTOSAMINE 328 (H) 07/07/2016      Allergies as of 09/26/2022       Reactions   Penicillins    Causes yeast infections Did it involve swelling of the face/tongue/throat, SOB, or low BP? No Did it involve sudden or severe rash/hives, skin peeling, or any reaction on the inside of your mouth or nose? No Did you need to seek medical attention at a hospital or doctor's office? No When did it last happen?  2018     If all above answers are "NO", may proceed with cephalosporin use. Tolerated cefazolin in 2020   Amoxicillin Other (See Comments)   Pt states it causes her a yeast infection  Causes yeast infections Did it involve swelling of the face/tongue/throat, SOB, or low BP? No Did it involve sudden or severe rash/hives, skin peeling, or any reaction on the inside of your mouth or nose? No Did you need to seek medical attention at a hospital or doctor's office?  No When did it last happen?  2018     If all above answers are "NO", may proceed with cephalosporin use. Tolerated cefazolin in 2020   Erythromycin    Passed out while taking        Medication List        Accurate as of September 26, 2022 11:59 PM. If you have any questions, ask your nurse or doctor.          acetaminophen 650 MG CR tablet Commonly known as: TYLENOL Take 1,300 mg by mouth 2 (two) times daily.   atorvastatin 40 MG tablet Commonly known as: LIPITOR TAKE 1 TABLET BY MOUTH  DAILY AT 6 PM   calcium carbonate 500 MG chewable tablet Commonly known as: TUMS - dosed in mg elemental calcium Chew 1-2 tablets by mouth daily as needed for indigestion or heartburn.   cyanocobalamin 1000 MCG tablet Commonly known as: VITAMIN B12 Take 1,000 mcg by mouth daily.   diphenhydramine-acetaminophen 25-500 MG Tabs tablet Commonly known as: TYLENOL PM Take 3 tablets by mouth at bedtime.   fenofibrate 145 MG tablet Commonly known as: TRICOR TAKE 1 TABLET BY MOUTH DAILY   fluticasone 50 MCG/ACT nasal spray Commonly known as: FLONASE Place 2 sprays into both nostrils daily. What changed:  how much to take when to take this reasons to take this   gabapentin 300 MG capsule Commonly known as: NEURONTIN TAKE 1 CAPSULE BY MOUTH 3  TIMES DAILY   glimepiride 2 MG tablet Commonly known as: AMARYL TAKE 1 TABLET BY MOUTH DAILY  BEFORE BREAKFAST   hydrochlorothiazide 25 MG tablet Commonly known as: HYDRODIURIL TAKE 1 TABLET BY MOUTH  DAILY   lisinopril 5 MG tablet Commonly known as: ZESTRIL TAKE 1 TABLET BY MOUTH DAILY   ondansetron 4 MG tablet Commonly known as: Zofran Take 1 tablet (4 mg total) by mouth as needed for nausea or vomiting.   ondansetron 4 MG tablet Commonly known as: Zofran Take 1 tablet (4 mg total) by mouth every 8 (eight) hours as needed for nausea or vomiting.   onetouch ultrasoft lancets Use as instructed once day.  E11.40   OneTouch Verio test  strip Generic drug: glucose blood USE AS INSTRUCTED TO CHECK BLOOD SUGAR TWICE DAILY   oxyCODONE-acetaminophen 5-325 MG tablet Commonly known as: PERCOCET/ROXICET Take 1-2 tablets by mouth every 8 (eight) hours as needed for severe pain.   tirzepatide 10 MG/0.5ML Pen Commonly known as: MOUNJARO Inject 10 mg into the skin once a week.  What changed: Another medication with the same name was removed. Continue taking this medication, and follow the directions you see here. Changed by: Reather Littler   Xigduo XR 12-998 MG Tb24 Generic drug: Dapagliflozin Pro-metFORMIN ER Take 1 tablet by mouth daily.        Allergies:  Allergies  Allergen Reactions   Penicillins     Causes yeast infections Did it involve swelling of the face/tongue/throat, SOB, or low BP? No Did it involve sudden or severe rash/hives, skin peeling, or any reaction on the inside of your mouth or nose? No Did you need to seek medical attention at a hospital or doctor's office? No When did it last happen?  2018     If all above answers are "NO", may proceed with cephalosporin use.  Tolerated cefazolin in 2020   Amoxicillin Other (See Comments)    Pt states it causes her a yeast infection  Causes yeast infections Did it involve swelling of the face/tongue/throat, SOB, or low BP? No Did it involve sudden or severe rash/hives, skin peeling, or any reaction on the inside of your mouth or nose? No Did you need to seek medical attention at a hospital or doctor's office? No When did it last happen?  2018     If all above answers are "NO", may proceed with cephalosporin use.  Tolerated cefazolin in 2020   Erythromycin     Passed out while taking    Past Medical History:  Diagnosis Date   Arthritis    Breast abscess 2000   right breast   Carpal tunnel syndrome    Complication of anesthesia    Degenerative disc disease    Diabetes mellitus type II    Eczema of hand    Hx of colonic polyps 12/02/2014   2004 - 2  diminutive polyps    Hyperlipidemia    Hypertension    Metatarsal deformity    Ovarian cyst 07/2017   left    PONV (postoperative nausea and vomiting)    Shingles 2000   Tubular adenoma of colon     Past Surgical History:  Procedure Laterality Date   ABDOMINAL HYSTERECTOMY  1981   menorrhagia   ABSCESS DRAINAGE Right    breast   APPENDECTOMY  1966   done with CSxn. Prophylactic   BREAST BIOPSY Left    carpel tunnel Right    CESAREAN SECTION     x2   COLONOSCOPY     LUMBAR LAMINECTOMY     5784,6962. 2011 L4,L5,S1   Right rotator cuff repair 09/2015     ROBOTIC ASSISTED SALPINGO OOPHERECTOMY Left 04/16/2018   Procedure: XI ROBOTIC ASSISTED  LEFT SALPINGO OOPHORECTOMY;  Surgeon: Shonna Chock, MD;  Location: [facility];  Service: Gynecology;  Laterality: Left;   SPINAL CORD STIMULATOR INSERTION N/A 12/08/2021   Procedure: PLACEMENT OF SPINAL CORD STIMULATOR;  Surgeon: Venita Lick, MD;  Location: [facility];  Service: Orthopedics;  Laterality: N/A;  2.5 hrs 3 C-Bed   TUBAL LIGATION      Family History  Problem Relation Age of Onset   Hypertension Father    Diabetes Mother    Stroke Mother    Hypertension Mother    High Cholesterol Mother    Heart disease Mother    Diabetes Brother    Breast cancer Daughter 64       unsure if did genetic testing   Breast cancer Maternal Aunt    Breast cancer Sister 57   Colon polyps Brother  Social History:  reports that she has never smoked. She has never used smokeless tobacco. She reports that she does not drink alcohol and does not use drugs.   Review of Systems   Lipid history: Has had high triglycerides and LDL, LDL below 100 on atorvastatin 40 mg prescribed by her PCP  Also has hypertriglyceridemia treated with Lovaza, triglycerides as follows   Lab Results  Component Value Date   CHOL 158 03/03/2022   CHOL 151 11/30/2021   CHOL 127 05/30/2021   Lab Results  Component Value Date   HDL 55.10 03/03/2022   HDL 50.30  11/30/2021   HDL 52.50 05/30/2021   Lab Results  Component Value Date   LDLCALC 68 03/03/2022   LDLCALC 42 05/30/2021   LDLCALC 53 11/01/2020   Lab Results  Component Value Date   TRIG 178.0 (H) 03/03/2022   TRIG 229.0 (H) 11/30/2021   TRIG 160.0 (H) 05/30/2021   Lab Results  Component Value Date   CHOLHDL 3 03/03/2022   CHOLHDL 3 11/30/2021   CHOLHDL 2 05/30/2021   Lab Results  Component Value Date   LDLDIRECT 89.0 11/30/2021   LDLDIRECT 69.0 08/19/2018   LDLDIRECT 80.0 02/06/2018            Hypertension: controlled with 5 mg Lisinopril and 25 mg HCTZ Also on Farxiga Blood pressure ok  BP Readings from Last 3 Encounters:  09/26/22 128/82  08/15/22 138/70  04/14/22 (!) 146/72   Lab Results  Component Value Date   K 4.0 08/11/2022    Most recent eye exam was 12/22  She has longstanding neuropathy, 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 on the left side, taking gabapentin 300 mg 3 times daily from her PCP  Last foot exam 9/23  Had back surgery 9/23   LABS:  No visits with results within 1 Week(s) from this visit.  Latest known visit with results is:  Lab on 08/11/2022  Component Date Value Ref Range Status   Hgb A1c MFr Bld 08/11/2022 9.5 (H)  4.6 - 6.5 % Final   Glycemic Control Guidelines for People with Diabetes:Non Diabetic:  <6%Goal of Therapy: <7%Additional Action Suggested:  >8%    Sodium 08/11/2022 135  135 - 145 mEq/L Final   Potassium 08/11/2022 4.0  3.5 - 5.1 mEq/L Final   Chloride 08/11/2022 100  96 - 112 mEq/L Final   CO2 08/11/2022 25  19 - 32 mEq/L Final   Glucose, Bld 08/11/2022 190 (H)  70 - 99 mg/dL Final   BUN 16/12/9602 21  6 - 23 mg/dL Final   Creatinine, Ser 08/11/2022 0.93  0.40 - 1.20 mg/dL Final   GFR 54/11/8117 59.48 (L)  >60.00 mL/min Final   Calculated using the CKD-EPI Creatinine Equation (2021)   Calcium 08/11/2022 10.1  8.4 - 10.5 mg/dL Final    Physical Examination:  BP 128/82 (BP Location: Left Arm, Patient Position: Sitting, Cuff  Size: Large)   Pulse 74   Ht 5\' 8"  (1.727 m)   Wt 175 lb (79.4 kg)   SpO2 95%   BMI 26.61 kg/m     ASSESSMENT:  Diabetes type 2, non-insulin-dependent  See history of present illness for detailed discussion of current diabetes management, blood sugar patterns and problems identified   Her A1c is last higher at 9.5  She had been on small dose of basal insulin along with Mounjaro, Xigduo and Amaryl  Blood sugars are much better with adding back a GLP-1 drug using Mounjaro Also fasting readings generally  much better with 8 units of Tresiba Although her blood sugars are really good overall in the mornings she is now getting readings going up in the 130s and 140s fasting No labs done today    PLAN:    She will increase Mounjaro to 10 mg when she finishes the 5 mg For now she will need to use at least 6 units of Tresiba to keep fasting readings in the range of 90-130 If the [facility] is effective and she can see blood sugars below about 100 in the morning she can stop the insulin completely A1c on the next visit   Patient Instructions  Tresiba 6 units, target 100-130  Check blood sugars on waking up days a week  Also check blood sugars about 2 hours after meals and do this after different meals by rotation  Recommended blood sugar levels on waking up are 90-130 and about 2 hours after meal is 130-160  Please bring your blood sugar monitor to each visit, thank you     Reather Littler 09/27/2022, 9:49 AM   Note: This office note was prepared with Dragon voice recognition system technology. Any transcriptional errors that result from this process are unintentional.

## 2022-09-26 NOTE — Patient Instructions (Addendum)
Tresiba 6 units, target 100-130  Check blood sugars on waking up days a week  Also check blood sugars about 2 hours after meals and do this after different meals by rotation  Recommended blood sugar levels on waking up are 90-130 and about 2 hours after meal is 130-160  Please bring your blood sugar monitor to each visit, thank you

## 2022-10-04 ENCOUNTER — Other Ambulatory Visit: Payer: Self-pay | Admitting: Nurse Practitioner

## 2022-10-04 DIAGNOSIS — E785 Hyperlipidemia, unspecified: Secondary | ICD-10-CM

## 2022-10-04 NOTE — Telephone Encounter (Signed)
Chart supports rx. Last OV: 11/30/2021 Next OV: 10/13/2022

## 2022-10-07 ENCOUNTER — Other Ambulatory Visit: Payer: Self-pay | Admitting: Nurse Practitioner

## 2022-10-07 DIAGNOSIS — E785 Hyperlipidemia, unspecified: Secondary | ICD-10-CM

## 2022-10-13 ENCOUNTER — Ambulatory Visit (INDEPENDENT_AMBULATORY_CARE_PROVIDER_SITE_OTHER): Payer: Medicare Other

## 2022-10-13 VITALS — BP 118/60 | HR 73 | Temp 97.9°F | Ht 68.5 in | Wt 167.0 lb

## 2022-10-13 DIAGNOSIS — Z0000 Encounter for general adult medical examination without abnormal findings: Secondary | ICD-10-CM | POA: Diagnosis not present

## 2022-10-13 NOTE — Progress Notes (Signed)
Subjective:   Sandra Ray is a 77 y.o. female who presents for Medicare Annual (Subsequent) preventive examination.  Visit Complete: In person    Review of Systems     Cardiac Risk Factors include: advanced age (>35men, >66 women);diabetes mellitus;dyslipidemia;hypertension     Objective:    Today's Vitals   10/13/22 1337  BP: 118/60  Pulse: 73  Temp: 97.9 F (36.6 C)  TempSrc: Oral  SpO2: 97%  Weight: 167 lb (75.8 kg)  Height: 5' 8.5" (1.74 m)   Body mass index is 25.02 kg/m.     10/13/2022    1:49 PM 12/01/2021   11:12 AM 08/30/2021    1:37 PM 05/15/2021    2:19 PM 11/29/2020    3:32 PM 08/03/2020   11:20 AM 05/12/2018    4:18 PM  Advanced Directives  Does Patient Have a Medical Advance Directive? Yes No No No Yes No No  Type of Advance Directive Out of facility DNR (pink MOST or yellow form)    Living will;Out of facility DNR (pink MOST or yellow form)    Does patient want to make changes to medical advance directive?     Yes (MAU/Ambulatory/Procedural Areas - Information given)    Would patient like information on creating a medical advance directive?  No - Patient declined No - Patient declined   Yes (MAU/Ambulatory/Procedural Areas - Information given)     Current Medications (verified) Outpatient Encounter Medications as of 10/13/2022  Medication Sig   acetaminophen (TYLENOL) 650 MG CR tablet Take 1,300 mg by mouth 2 (two) times daily.   atorvastatin (LIPITOR) 40 MG tablet TAKE 1 TABLET BY MOUTH DAILY AT  6 PM   calcium carbonate (TUMS - DOSED IN MG ELEMENTAL CALCIUM) 500 MG chewable tablet Chew 1-2 tablets by mouth daily as needed for indigestion or heartburn.   cyanocobalamin (VITAMIN B12) 1000 MCG tablet Take 1,000 mcg by mouth daily.   Dapagliflozin-metFORMIN HCl ER (XIGDUO XR) 12-998 MG TB24 Take 1 tablet by mouth daily.   diphenhydramine-acetaminophen (TYLENOL PM) 25-500 MG TABS tablet Take 3 tablets by mouth at bedtime.   fenofibrate (TRICOR) 145 MG tablet  TAKE 1 TABLET BY MOUTH DAILY   fluticasone (FLONASE) 50 MCG/ACT nasal spray Place 2 sprays into both nostrils daily. (Patient taking differently: Place 1 spray into both nostrils daily as needed for allergies.)   gabapentin (NEURONTIN) 300 MG capsule TAKE 1 CAPSULE BY MOUTH 3  TIMES DAILY   hydrochlorothiazide (HYDRODIURIL) 25 MG tablet TAKE 1 TABLET BY MOUTH  DAILY   Lancets (ONETOUCH ULTRASOFT) lancets Use as instructed once day.  E11.40   lisinopril (ZESTRIL) 5 MG tablet TAKE 1 TABLET BY MOUTH DAILY   ondansetron (ZOFRAN) 4 MG tablet Take 1 tablet (4 mg total) by mouth as needed for nausea or vomiting.   ondansetron (ZOFRAN) 4 MG tablet Take 1 tablet (4 mg total) by mouth every 8 (eight) hours as needed for nausea or vomiting.   ONETOUCH VERIO test strip USE AS INSTRUCTED TO CHECK BLOOD SUGAR TWICE DAILY   tirzepatide (MOUNJARO) 10 MG/0.5ML Pen Inject 10 mg into the skin once a week.   glimepiride (AMARYL) 2 MG tablet TAKE 1 TABLET BY MOUTH DAILY  BEFORE BREAKFAST (Patient not taking: Reported on 10/13/2022)   oxyCODONE-acetaminophen (PERCOCET/ROXICET) 5-325 MG tablet Take 1-2 tablets by mouth every 8 (eight) hours as needed for severe pain. (Patient not taking: Reported on 10/13/2022)   Facility-Administered Encounter Medications as of 10/13/2022  Medication   vancomycin (  VANCOREADY) IVPB 1500 mg/300 mL    Allergies (verified) Penicillins, Amoxicillin, and Erythromycin   History: Past Medical History:  Diagnosis Date   Arthritis    Breast abscess 2000   right breast   Carpal tunnel syndrome    Complication of anesthesia    Degenerative disc disease    Diabetes mellitus type II    Eczema of hand    Hx of colonic polyps 12/02/2014   2004 - 2 diminutive polyps    Hyperlipidemia    Hypertension    Metatarsal deformity    Ovarian cyst 07/2017   left    PONV (postoperative nausea and vomiting)    Shingles 2000   Tubular adenoma of colon    Past Surgical History:  Procedure  Laterality Date   ABDOMINAL HYSTERECTOMY  1981   menorrhagia   ABSCESS DRAINAGE Right    breast   APPENDECTOMY  1966   done with CSxn. Prophylactic   BREAST BIOPSY Left    carpel tunnel Right    CESAREAN SECTION     x2   COLONOSCOPY     LUMBAR LAMINECTOMY     1610,9604. 2011 L4,L5,S1   Right rotator cuff repair 09/2015     ROBOTIC ASSISTED SALPINGO OOPHERECTOMY Left 04/16/2018   Procedure: XI ROBOTIC ASSISTED  LEFT SALPINGO OOPHORECTOMY;  Surgeon: Shonna Chock, MD;  Location: [facility];  Service: Gynecology;  Laterality: Left;   SPINAL CORD STIMULATOR INSERTION N/A 12/08/2021   Procedure: PLACEMENT OF SPINAL CORD STIMULATOR;  Surgeon: Venita Lick, MD;  Location: [facility];  Service: Orthopedics;  Laterality: N/A;  2.5 hrs 3 C-Bed   TUBAL LIGATION     Family History  Problem Relation Age of Onset   Hypertension Father    Diabetes Mother    Stroke Mother    Hypertension Mother    High Cholesterol Mother    Heart disease Mother    Diabetes Brother    Breast cancer Daughter 93       unsure if did genetic testing   Breast cancer Maternal Aunt    Breast cancer Sister 14   Colon polyps Brother    Social History   Socioeconomic History   Marital status: Married    Spouse name: Not on file   Number of children: 2   Years of education: Not on file   Highest education level: Not on file  Occupational History   Not on file  Tobacco Use   Smoking status: Never   Smokeless tobacco: Never  Vaping Use   Vaping status: Never Used  Substance and Sexual Activity   Alcohol use: No   Drug use: No   Sexual activity: Not Currently  Other Topics Concern   Not on file  Social History Narrative   Married 2 daughters   Not working outside home   no caffeine   12/02/2014   Social Determinants of Health   Financial Resource Strain: Low Risk  (10/13/2022)   Overall Financial Resource Strain (CARDIA)    Difficulty of Paying Living Expenses: Not hard at all  Food Insecurity: No Food  Insecurity (10/13/2022)   Hunger Vital Sign    Worried About Running Out of Food in the Last Year: Never true    Ran Out of Food in the Last Year: Never true  Transportation Needs: No Transportation Needs (10/13/2022)   PRAPARE - Administrator, Civil Service (Medical): No    Lack of Transportation (Non-Medical): No  Physical Activity: Inactive (10/13/2022)  Exercise Vital Sign    Days of Exercise per Week: 0 days    Minutes of Exercise per Session: 0 min  Stress: No Stress Concern Present (10/13/2022)   [employer] of Occupational Health - Occupational Stress Questionnaire    Feeling of Stress : Not at all  Social Connections: Moderately Integrated (10/13/2022)   Social Connection and Isolation Panel [NHANES]    Frequency of Communication with Friends and Family: Twice a week    Frequency of Social Gatherings with Friends and Family: Once a week    Attends Religious Services: More than 4 times per year    Active Member of [employer] or Organizations: No    Attends Engineer, structural: Never    Marital Status: Married    Tobacco Counseling Counseling given: Not Answered   Clinical Intake:  Pre-visit preparation completed: Yes  Pain : No/denies pain     Nutritional Risks: Nausea/ vomitting/ diarrhea (due to medication) Diabetes: Yes CBG done?: No Did pt. bring in CBG monitor from home?: No  How often do you need to have someone help you when you read instructions, pamphlets, or other written materials from your doctor or pharmacy?: 1 - Never  Interpreter Needed?: No  Information entered by :: NAllen LPN   Activities of Daily Living    10/13/2022    1:39 PM 12/01/2021   11:11 AM  In your present state of health, do you have any difficulty performing the following activities:  Hearing? 0 0  Vision? 0 0  Difficulty concentrating or making decisions? 1 0  Comment takes prevagen   Walking or climbing stairs? 0 0  Dressing or bathing? 0 0  Doing  errands, shopping? 0   Preparing Food and eating ? N   Using the Toilet? N   In the past six months, have you accidently leaked urine? Y   Comment wears a pad   Do you have problems with loss of bowel control? N   Managing your Medications? N   Managing your Finances? N   Housekeeping or managing your Housekeeping? N     Patient Care Team: Nche, Bonna Gains, NP as PCP - General (Internal Medicine) Sheard, Joline Maxcy, DPM (Inactive) as Consulting Physician (Podiatry) Conley Rolls, My Harris, [place] as Referring Physician (Optometry)  Indicate any recent Medical Services you may have received from other than [facility] providers in the past year (date may be approximate).     Assessment:   This is a routine wellness examination for Mikaylee.  Hearing/Vision screen Hearing Screening - Comments:: Denies hearing issues Vision Screening - Comments:: Regular eye exams, [facility]  Dietary issues and exercise activities discussed:     Goals Addressed             This Visit's Progress    Patient Stated       10/13/2022, wants to feel better       Depression Screen    10/13/2022    1:54 PM 08/30/2021    1:41 PM 08/30/2021    1:37 PM 08/30/2021    1:35 PM 05/30/2021   10:59 AM 08/03/2020   11:22 AM 04/03/2018   10:07 AM  PHQ 2/9 Scores  PHQ - 2 Score 0 0 0 0 0 0 0  PHQ- 9 Score 0    1      Fall Risk    10/13/2022    1:51 PM 08/30/2021    1:37 PM 05/30/2021   10:05 AM 08/03/2020   11:22  AM 04/03/2018   10:07 AM  Fall Risk   Falls in the past year? 1 0 0 0 0  Comment tripped on shoe      Number falls in past yr: 0 0 0 0   Injury with Fall? 1 0 0 0   Comment broke ribs      Risk for fall due to : Medication side effect  No Fall Risks    Follow up Falls prevention discussed;Falls evaluation completed Falls evaluation completed;Education provided Falls evaluation completed Falls prevention discussed     MEDICARE RISK AT HOME:  Medicare Risk at Home - 10/13/22 1351     Any stairs in or around  the home? Yes    If so, are there any without handrails? No    Home free of loose throw rugs in walkways, pet beds, electrical cords, etc? Yes    Adequate lighting in your home to reduce risk of falls? Yes    Life alert? No    Use of a cane, walker or w/c? No    Grab bars in the bathroom? Yes    Shower chair or bench in shower? Yes    Elevated toilet seat or a handicapped toilet? Yes             TIMED UP AND GO:  Was the test performed?  Yes  Length of time to ambulate 10 feet: 6 sec Gait slow and steady without use of assistive device    Cognitive Function:  6 CIT not administered. Normal cognition per direct observation.      03/28/2016    1:27 PM  MMSE - Mini Mental State Exam  Orientation to time 5  Orientation to Place 5  Registration 3  Attention/ Calculation 5  Recall 3  Language- name 2 objects 2  Language- repeat 1  Language- follow 3 step command 3  Language- read & follow direction 1  Write a sentence 1  Copy design 1  Total score 30        08/03/2020   11:32 AM  6CIT Screen  What Year? 0 points  What month? 0 points  What time? 0 points  Count back from 20 0 points  Months in reverse 2 points  Repeat phrase 2 points  Total Score 4 points    Immunizations Immunization History  Administered Date(s) Administered   Fluad Quad(high Dose 65+) 11/29/2020, 11/30/2021   Influenza Split 12/19/2011   Influenza Whole 12/17/2008, 12/22/2009   Influenza, High Dose Seasonal PF 12/25/2014, 11/18/2015, 11/23/2016, 12/14/2017, 10/31/2018   Influenza,inj,Quad PF,6+ Mos 12/18/2012, 12/23/2013   Influenza,inj,quad, With Preservative 12/11/2016   Influenza-Unspecified 11/24/2019   PFIZER(Purple Top)SARS-COV-2 Vaccination 04/18/2019, 05/09/2019, 12/06/2019, 06/14/2020   Pneumococcal Conjugate-13 12/23/2013, 08/30/2015   Pneumococcal Polysaccharide-23 06/19/2011, 01/16/2017   Td 02/23/2004   Tdap 01/30/2015   Zoster, Live 03/28/2010    TDAP status: Up to  date  Flu Vaccine status: Due, Education has been provided regarding the importance of this vaccine. Advised may receive this vaccine at local pharmacy or Health Dept. Aware to provide a copy of the vaccination record if obtained from local pharmacy or Health Dept. Verbalized acceptance and understanding.  Pneumococcal vaccine status: Up to date  Covid-19 vaccine status: Information provided on how to obtain vaccines.   Qualifies for Shingles Vaccine? Yes   Zostavax completed Yes   Shingrix Completed?: No.    Education has been provided regarding the importance of this vaccine. Patient has been advised to call insurance company to  determine out of pocket expense if they have not yet received this vaccine. Advised may also receive vaccine at local pharmacy or Health Dept. Verbalized acceptance and understanding.  Screening Tests Health Maintenance  Topic Date Due   Zoster Vaccines- Shingrix (1 of 2) 07/28/1995   COVID-19 Vaccine (5 - 2023-24 season) 11/11/2021   OPHTHALMOLOGY EXAM  02/24/2022   Diabetic kidney evaluation - Urine ACR  05/31/2022   INFLUENZA VACCINE  10/12/2022   FOOT EXAM  12/01/2022   HEMOGLOBIN A1C  02/10/2023   Diabetic kidney evaluation - eGFR measurement  08/11/2023   Medicare Annual Wellness (AWV)  10/13/2023   DTaP/Tdap/Td (3 - Td or Tdap) 01/29/2025   Pneumonia Vaccine 79+ Years old  Completed   DEXA SCAN  Completed   Hepatitis C Screening  Completed   HPV VACCINES  Aged Out   Colonoscopy  Discontinued    Health Maintenance  Health Maintenance Due  Topic Date Due   Zoster Vaccines- Shingrix (1 of 2) 07/28/1995   COVID-19 Vaccine (5 - 2023-24 season) 11/11/2021   OPHTHALMOLOGY EXAM  02/24/2022   Diabetic kidney evaluation - Urine ACR  05/31/2022   INFLUENZA VACCINE  10/12/2022    Colorectal cancer screening: No longer required.   Mammogram status: No longer required due to age.  Bone Density status: Completed 01/21/2021.   Lung Cancer Screening:  (Low Dose CT Chest recommended if Age 66-80 years, 20 pack-year currently smoking OR have quit w/in 15years.) does not qualify.   Lung Cancer Screening Referral: no  Additional Screening:  Hepatitis C Screening: does qualify; Completed 01/28/2015  Vision Screening: Recommended annual ophthalmology exams for early detection of glaucoma and other disorders of the eye. Is the patient up to date with their annual eye exam?  Yes  Who is the provider or what is the name of the office in which the patient attends annual eye exams? [facility] If pt is not established with a provider, would they like to be referred to a provider to establish care? No .   Dental Screening: Recommended annual dental exams for proper oral hygiene  Diabetic Foot Exam: Diabetic Foot Exam: Completed 11/30/2021  Community Resource Referral / Chronic Care Management: CRR required this visit?  No   CCM required this visit?  No     Plan:     I have personally reviewed and noted the following in the patient's chart:   Medical and social history Use of alcohol, tobacco or illicit drugs  Current medications and supplements including opioid prescriptions. Patient is not currently taking opioid prescriptions. Functional ability and status Nutritional status Physical activity Advanced directives List of other physicians Hospitalizations, surgeries, and ER visits in previous 12 months Vitals Screenings to include cognitive, depression, and falls Referrals and appointments  In addition, I have reviewed and discussed with patient certain preventive protocols, quality metrics, and best practice recommendations. A written personalized care plan for preventive services as well as general preventive health recommendations were provided to patient.     Barb Merino, LPN   07/14/6642   After Visit Summary: in person  Nurse Notes: none

## 2022-10-13 NOTE — Patient Instructions (Signed)
Sandra Ray , Thank you for taking time to come for your Medicare Wellness Visit. I appreciate your ongoing commitment to your health goals. Please review the following plan we discussed and let me know if I can assist you in the future.   Referrals/Orders/Follow-Ups/Clinician Recommendations: none   This is a list of the screening recommended for you and due dates:  Health Maintenance  Topic Date Due   Zoster (Shingles) Vaccine (1 of 2) 07/28/1995   COVID-19 Vaccine (5 - 2023-24 season) 11/11/2021   Eye exam for diabetics  02/24/2022   Yearly kidney health urinalysis for diabetes  05/31/2022   Flu Shot  10/12/2022   Complete foot exam   12/01/2022   Hemoglobin A1C  02/10/2023   Yearly kidney function blood test for diabetes  08/11/2023   Medicare Annual Wellness Visit  10/13/2023   DTaP/Tdap/Td vaccine (3 - Td or Tdap) 01/29/2025   Pneumonia Vaccine  Completed   DEXA scan (bone density measurement)  Completed   Hepatitis C Screening  Completed   HPV Vaccine  Aged Out   Colon Cancer Screening  Discontinued    Advanced directives: (In Chart) A copy of your advanced directives are scanned into your chart should your provider ever need it.  Next Medicare Annual Wellness Visit scheduled for next year: Yes  Preventive Care 12 Years and Older, Female Preventive care refers to lifestyle choices and visits with your health care provider that can promote health and wellness. What does preventive care include? A yearly physical exam. This is also called an annual well check. Dental exams once or twice a year. Routine eye exams. Ask your health care provider how often you should have your eyes checked. Personal lifestyle choices, including: Daily care of your teeth and gums. Regular physical activity. Eating a healthy diet. Avoiding tobacco and drug use. Limiting alcohol use. Practicing safe sex. Taking low-dose aspirin every day. Taking vitamin and mineral supplements as recommended by  your health care provider. What happens during an annual well check? The services and screenings done by your health care provider during your annual well check will depend on your age, overall health, lifestyle risk factors, and family history of disease. Counseling  Your health care provider may ask you questions about your: Alcohol use. Tobacco use. Drug use. Emotional well-being. Home and relationship well-being. Sexual activity. Eating habits. History of falls. Memory and ability to understand (cognition). Work and work Astronomer. Reproductive health. Screening  You may have the following tests or measurements: Height, weight, and BMI. Blood pressure. Lipid and cholesterol levels. These may be checked every 5 years, or more frequently if you are over 57 years old. Skin check. Lung cancer screening. You may have this screening every year starting at age 29 if you have a 30-pack-year history of smoking and currently smoke or have quit within the past 15 years. Fecal occult blood test (FOBT) of the stool. You may have this test every year starting at age 31. Flexible sigmoidoscopy or colonoscopy. You may have a sigmoidoscopy every 5 years or a colonoscopy every 10 years starting at age 72. Hepatitis C blood test. Hepatitis B blood test. Sexually transmitted disease (STD) testing. Diabetes screening. This is done by checking your blood sugar (glucose) after you have not eaten for a while (fasting). You may have this done every 1-3 years. Bone density scan. This is done to screen for osteoporosis. You may have this done starting at age 31. Mammogram. This may be done every 1-2  years. Talk to your health care provider about how often you should have regular mammograms. Talk with your health care provider about your test results, treatment options, and if necessary, the need for more tests. Vaccines  Your health care provider may recommend certain vaccines, such as: Influenza  vaccine. This is recommended every year. Tetanus, diphtheria, and acellular pertussis (Tdap, Td) vaccine. You may need a Td booster every 10 years. Zoster vaccine. You may need this after age 64. Pneumococcal 13-valent conjugate (PCV13) vaccine. One dose is recommended after age 8. Pneumococcal polysaccharide (PPSV23) vaccine. One dose is recommended after age 62. Talk to your health care provider about which screenings and vaccines you need and how often you need them. This information is not intended to replace advice given to you by your health care provider. Make sure you discuss any questions you have with your health care provider. Document Released: 03/26/2015 Document Revised: 11/17/2015 Document Reviewed: 12/29/2014 Elsevier Interactive Patient Education  2017 [employer].  Fall Prevention in the Home Falls can cause injuries. They can happen to people of all ages. There are many things you can do to make your home safe and to help prevent falls. What can I do on the outside of my home? Regularly fix the edges of walkways and driveways and fix any cracks. Remove anything that might make you trip as you walk through a door, such as a raised step or threshold. Trim any bushes or trees on the path to your home. Use bright outdoor lighting. Clear any walking paths of anything that might make someone trip, such as rocks or tools. Regularly check to see if handrails are loose or broken. Make sure that both sides of any steps have handrails. Any raised decks and porches should have guardrails on the edges. Have any leaves, snow, or ice cleared regularly. Use sand or salt on walking paths during winter. Clean up any spills in your garage right away. This includes oil or grease spills. What can I do in the bathroom? Use night lights. Install grab bars by the toilet and in the tub and shower. Do not use towel bars as grab bars. Use non-skid mats or decals in the tub or shower. If you  need to sit down in the shower, use a plastic, non-slip stool. Keep the floor dry. Clean up any water that spills on the floor as soon as it happens. Remove soap buildup in the tub or shower regularly. Attach bath mats securely with double-sided non-slip rug tape. Do not have throw rugs and other things on the floor that can make you trip. What can I do in the bedroom? Use night lights. Make sure that you have a light by your bed that is easy to reach. Do not use any sheets or blankets that are too big for your bed. They should not hang down onto the floor. Have a firm chair that has side arms. You can use this for support while you get dressed. Do not have throw rugs and other things on the floor that can make you trip. What can I do in the kitchen? Clean up any spills right away. Avoid walking on wet floors. Keep items that you use a lot in easy-to-reach places. If you need to reach something above you, use a strong step stool that has a grab bar. Keep electrical cords out of the way. Do not use floor polish or wax that makes floors slippery. If you must use wax, use non-skid floor  wax. Do not have throw rugs and other things on the floor that can make you trip. What can I do with my stairs? Do not leave any items on the stairs. Make sure that there are handrails on both sides of the stairs and use them. Fix handrails that are broken or loose. Make sure that handrails are as long as the stairways. Check any carpeting to make sure that it is firmly attached to the stairs. Fix any carpet that is loose or worn. Avoid having throw rugs at the top or bottom of the stairs. If you do have throw rugs, attach them to the floor with carpet tape. Make sure that you have a light switch at the top of the stairs and the bottom of the stairs. If you do not have them, ask someone to add them for you. What else can I do to help prevent falls? Wear shoes that: Do not have high heels. Have rubber  bottoms. Are comfortable and fit you well. Are closed at the toe. Do not wear sandals. If you use a stepladder: Make sure that it is fully opened. Do not climb a closed stepladder. Make sure that both sides of the stepladder are locked into place. Ask someone to hold it for you, if possible. Clearly mark and make sure that you can see: Any grab bars or handrails. First and last steps. Where the edge of each step is. Use tools that help you move around (mobility aids) if they are needed. These include: Canes. Walkers. Scooters. Crutches. Turn on the lights when you go into a dark area. Replace any light bulbs as soon as they burn out. Set up your furniture so you have a clear path. Avoid moving your furniture around. If any of your floors are uneven, fix them. If there are any pets around you, be aware of where they are. Review your medicines with your doctor. Some medicines can make you feel dizzy. This can increase your chance of falling. Ask your doctor what other things that you can do to help prevent falls. This information is not intended to replace advice given to you by your health care provider. Make sure you discuss any questions you have with your health care provider. Document Released: 12/24/2008 Document Revised: 08/05/2015 Document Reviewed: 04/03/2014 Elsevier Interactive Patient Education  2017 [employer].

## 2023-01-03 ENCOUNTER — Telehealth: Payer: Self-pay

## 2023-01-03 NOTE — Telephone Encounter (Signed)
Left voicemail informing patient she is due for lab work and to call the office to schedule

## 2023-01-16 ENCOUNTER — Other Ambulatory Visit: Payer: Self-pay

## 2023-01-16 DIAGNOSIS — E1165 Type 2 diabetes mellitus with hyperglycemia: Secondary | ICD-10-CM

## 2023-01-19 ENCOUNTER — Other Ambulatory Visit (INDEPENDENT_AMBULATORY_CARE_PROVIDER_SITE_OTHER): Payer: Medicare Other

## 2023-01-19 DIAGNOSIS — E1165 Type 2 diabetes mellitus with hyperglycemia: Secondary | ICD-10-CM | POA: Diagnosis not present

## 2023-01-19 LAB — BASIC METABOLIC PANEL
BUN: 34 mg/dL — ABNORMAL HIGH (ref 6–23)
CO2: 31 meq/L (ref 19–32)
Calcium: 10.9 mg/dL — ABNORMAL HIGH (ref 8.4–10.5)
Chloride: 101 meq/L (ref 96–112)
Creatinine, Ser: 1.03 mg/dL (ref 0.40–1.20)
GFR: 52.46 mL/min — ABNORMAL LOW (ref >60.00)
Glucose, Bld: 112 mg/dL — ABNORMAL HIGH (ref 70–99)
Potassium: 4 meq/L (ref 3.5–5.1)
Sodium: 140 meq/L (ref 135–145)

## 2023-01-19 LAB — MICROALBUMIN / CREATININE URINE RATIO
Creatinine,U: 75 mg/dL
Microalb Creat Ratio: 1.1 mg/g (ref 0.0–30.0)
Microalb, Ur: 0.8 mg/dL (ref 0.0–1.9)

## 2023-01-19 LAB — HEMOGLOBIN A1C: Hgb A1c MFr Bld: 6.4 % (ref 4.6–6.5)

## 2023-01-24 ENCOUNTER — Encounter: Payer: Self-pay | Admitting: Endocrinology

## 2023-01-24 ENCOUNTER — Ambulatory Visit: Payer: Medicare Other | Admitting: Endocrinology

## 2023-01-24 VITALS — BP 130/70 | HR 62 | Resp 20 | Ht 68.5 in | Wt 172.4 lb

## 2023-01-24 DIAGNOSIS — E118 Type 2 diabetes mellitus with unspecified complications: Secondary | ICD-10-CM | POA: Diagnosis not present

## 2023-01-24 DIAGNOSIS — Z7984 Long term (current) use of oral hypoglycemic drugs: Secondary | ICD-10-CM

## 2023-01-24 DIAGNOSIS — Z7985 Long-term (current) use of injectable non-insulin antidiabetic drugs: Secondary | ICD-10-CM | POA: Diagnosis not present

## 2023-01-24 NOTE — Progress Notes (Signed)
Outpatient Endocrinology Note Sandra Martinique Pizzimenti, MD  01/24/23  Patient's Name: Sandra Ray    DOB: Mar 06, 1946    MRN: [id number]                                                    REASON OF VISIT: Follow up of type 2 diabetes mellitus  PCP: Elease Etienne Bonna Gains, NP  HISTORY OF PRESENT ILLNESS:   Sandra Ray is a 77 y.o. old female with past medical history listed below, is here for follow up for type 2 diabetes mellitus.  Patient was last seen by Dr. Lucianne Muss in July 2024.  Pertinent Diabetes History: Patient was diagnosed with type 2 diabetes mellitus in 1996.  Chronic Diabetes Complications : Retinopathy: no. Last ophthalmology exam was done on annually, Due, following with ophthalmology regularly.  Nephropathy: CKD III, on ACE/ARB / lisinopril.  Peripheral neuropathy: yes, on gabapentin 300 mg 3 times a day from PCP.  Had back surgery in September 2023. Coronary artery disease: no Stroke: no  Relevant comorbidities and cardiovascular risk factors: Obesity: no Body mass index is 25.83 kg/m.  Hypertension: Yes  Hyperlipidemia : Yes, on statin   Current / Home Diabetic regimen includes: Amaryl 4 mg daily in the morning. Xigduo XR 12/998 mg  1 tablet daily. Mounjaro 10 mg weekly.  Prior diabetic medications: She had taken glipizide and Actos in the past.  Actos was stopped due to significant weight gain.  She had taken Januvia, Tradjenta and metformin in the past.  Nausea with high dose of metformin.  Glycemic data:   One Touch Verio reflect glucometer.  Reviewed from October 30 to January 24, 2023  Sandra Ray's glucose meter is computer downloaded. Raw data and trends analyzed.   -  She has been testing her blood glucoses 0.3 times daily.  -  Average glucose for the last 14 days is 121 mg/dl, range 92 - 657. -Glucose: 132, 102, 160, 92.    Hypoglycemia: Patient has no hypoglycemic episodes. Patient has hypoglycemia awareness.  Factors modifying glucose control: 1.  Diabetic  diet assessment: 3   2.  Staying active or exercising: No formal exercise.  3.  Medication compliance: compliant all of the time.  Interval history  Hemoglobin A1c has improved from 9.5 to 6.4%.  Congratulated her.  In the last visit patient was given Tresiba 6 unit to use for hyperglycemia and as needed.  Patient has not been using [place] anymore.  She is somewhat confused about medication between [place] however she thinks she is not taking [place] anymore and has been taking Mounjaro every Wednesday.  She states one of her medications is very expensive , but not to [facility].  She cannot tell that medication in the clinic today.  She has been tolerating Mounjaro well.  No other complaints today.  Evaristo Bury is not on the medication list.  Probably she had received a sample from the clinic.  REVIEW OF SYSTEMS As per history of present illness.   PAST MEDICAL HISTORY: Past Medical History:  Diagnosis Date   Arthritis    Breast abscess 2000   right breast   Carpal tunnel syndrome    Complication of anesthesia    Degenerative disc disease    Diabetes mellitus type II    Eczema of hand    Hx of colonic  polyps 12/02/2014   2004 - 2 diminutive polyps    Hyperlipidemia    Hypertension    Metatarsal deformity    Ovarian cyst 07/2017   left    PONV (postoperative nausea and vomiting)    Shingles 2000   Tubular adenoma of colon     PAST SURGICAL HISTORY: Past Surgical History:  Procedure Laterality Date   ABDOMINAL HYSTERECTOMY  1981   menorrhagia   ABSCESS DRAINAGE Right    breast   APPENDECTOMY  1966   done with CSxn. Prophylactic   BREAST BIOPSY Left    carpel tunnel Right    CESAREAN SECTION     x2   COLONOSCOPY     LUMBAR LAMINECTOMY     7829,5621. 2011 L4,L5,S1   Right rotator cuff repair 09/2015     ROBOTIC ASSISTED SALPINGO OOPHERECTOMY Left 04/16/2018   Procedure: XI ROBOTIC ASSISTED  LEFT SALPINGO OOPHORECTOMY;  Surgeon: Shonna Chock, MD;  Location:  [facility];  Service: Gynecology;  Laterality: Left;   SPINAL CORD STIMULATOR INSERTION N/A 12/08/2021   Procedure: PLACEMENT OF SPINAL CORD STIMULATOR;  Surgeon: Venita Lick, MD;  Location: [facility];  Service: Orthopedics;  Laterality: N/A;  2.5 hrs 3 C-Bed   TUBAL LIGATION      ALLERGIES: Allergies  Allergen Reactions   Penicillins     Causes yeast infections Did it involve swelling of the face/tongue/throat, SOB, or low BP? No Did it involve sudden or severe rash/hives, skin peeling, or any reaction on the inside of your mouth or nose? No Did you need to seek medical attention at a hospital or doctor's office? No When did it last happen?  2018     If all above answers are "NO", may proceed with cephalosporin use.  Tolerated cefazolin in 2020   Amoxicillin Other (See Comments)    Pt states it causes her a yeast infection  Causes yeast infections Did it involve swelling of the face/tongue/throat, SOB, or low BP? No Did it involve sudden or severe rash/hives, skin peeling, or any reaction on the inside of your mouth or nose? No Did you need to seek medical attention at a hospital or doctor's office? No When did it last happen?  2018     If all above answers are "NO", may proceed with cephalosporin use.  Tolerated cefazolin in 2020   Erythromycin     Passed out while taking    FAMILY HISTORY:  Family History  Problem Relation Age of Onset   Hypertension Father    Diabetes Mother    Stroke Mother    Hypertension Mother    High Cholesterol Mother    Heart disease Mother    Diabetes Brother    Breast cancer Daughter 11       unsure if did genetic testing   Breast cancer Maternal Aunt    Breast cancer Sister 72   Colon polyps Brother     SOCIAL HISTORY: Social History   Socioeconomic History   Marital status: Married    Spouse name: Not on file   Number of children: 2   Years of education: Not on file   Highest education level: Not on file  Occupational History   Not  on file  Tobacco Use   Smoking status: Never   Smokeless tobacco: Never  Vaping Use   Vaping status: Never Used  Substance and Sexual Activity   Alcohol use: No   Drug use: No   Sexual activity: Not Currently  Other Topics Concern   Not on file  Social History Narrative   Married 2 daughters   Not working outside home   no caffeine   12/02/2014   Social Determinants of Health   Financial Resource Strain: Low Risk  (10/13/2022)   Overall Financial Resource Strain (CARDIA)    Difficulty of Paying Living Expenses: Not hard at all  Food Insecurity: No Food Insecurity (10/13/2022)   Hunger Vital Sign    Worried About Running Out of Food in the Last Year: Never true    Ran Out of Food in the Last Year: Never true  Transportation Needs: No Transportation Needs (10/13/2022)   PRAPARE - Administrator, Civil Service (Medical): No    Lack of Transportation (Non-Medical): No  Physical Activity: Inactive (10/13/2022)   Exercise Vital Sign    Days of Exercise per Week: 0 days    Minutes of Exercise per Session: 0 min  Stress: No Stress Concern Present (10/13/2022)   [employer] of Occupational Health - Occupational Stress Questionnaire    Feeling of Stress : Not at all  Social Connections: Moderately Integrated (10/13/2022)   Social Connection and Isolation Panel [NHANES]    Frequency of Communication with Friends and Family: Twice a week    Frequency of Social Gatherings with Friends and Family: Once a week    Attends Religious Services: More than 4 times per year    Active Member of [employer] or Organizations: No    Attends Banker Meetings: Never    Marital Status: Married    MEDICATIONS:  Current Outpatient Medications  Medication Sig Dispense Refill   acetaminophen (TYLENOL) 650 MG CR tablet Take 1,300 mg by mouth 2 (two) times daily.     atorvastatin (LIPITOR) 40 MG tablet TAKE 1 TABLET BY MOUTH DAILY AT  6 PM 20 tablet 17   calcium carbonate (TUMS - DOSED  IN MG ELEMENTAL CALCIUM) 500 MG chewable tablet Chew 1-2 tablets by mouth daily as needed for indigestion or heartburn.     cyanocobalamin (VITAMIN B12) 1000 MCG tablet Take 1,000 mcg by mouth daily.     Dapagliflozin-metFORMIN HCl ER (XIGDUO XR) 12-998 MG TB24 Take 1 tablet by mouth daily.     diphenhydramine-acetaminophen (TYLENOL PM) 25-500 MG TABS tablet Take 3 tablets by mouth at bedtime.     fenofibrate (TRICOR) 145 MG tablet TAKE 1 TABLET BY MOUTH DAILY 100 tablet 2   fluticasone (FLONASE) 50 MCG/ACT nasal spray Place 2 sprays into both nostrils daily. (Patient taking differently: Place 1 spray into both nostrils daily as needed for allergies.) 16 g 0   gabapentin (NEURONTIN) 300 MG capsule TAKE 1 CAPSULE BY MOUTH 3  TIMES DAILY 240 capsule 3   glimepiride (AMARYL) 2 MG tablet TAKE 1 TABLET BY MOUTH DAILY  BEFORE BREAKFAST 100 tablet 2   hydrochlorothiazide (HYDRODIURIL) 25 MG tablet TAKE 1 TABLET BY MOUTH  DAILY 100 tablet 2   Lancets (ONETOUCH ULTRASOFT) lancets Use as instructed once day.  E11.40 100 each 5   lisinopril (ZESTRIL) 5 MG tablet TAKE 1 TABLET BY MOUTH DAILY 100 tablet 2   ondansetron (ZOFRAN) 4 MG tablet Take 1 tablet (4 mg total) by mouth as needed for nausea or vomiting. 30 tablet 1   ondansetron (ZOFRAN) 4 MG tablet Take 1 tablet (4 mg total) by mouth every 8 (eight) hours as needed for nausea or vomiting. 20 tablet 1   ONETOUCH VERIO test strip USE AS INSTRUCTED TO CHECK  BLOOD SUGAR TWICE DAILY 200 strip 2   oxyCODONE-acetaminophen (PERCOCET/ROXICET) 5-325 MG tablet Take 1-2 tablets by mouth every 8 (eight) hours as needed for severe pain. 12 tablet 0   tirzepatide (MOUNJARO) 10 MG/0.5ML Pen Inject 10 mg into the skin once a week. 6 mL 3   No current facility-administered medications for this visit.   Facility-Administered Medications Ordered in Other Visits  Medication Dose Route Frequency Provider Last Rate Last Admin   vancomycin (VANCOREADY) IVPB 1500 mg/300 mL   1,500 mg Intravenous Once Venita Lick, MD        PHYSICAL EXAM: Vitals:   01/24/23 0833 01/24/23 0838  BP: (!) 162/70 130/70  Pulse: 62   Resp: 20   SpO2: 98%   Weight: 172 lb 6.4 oz (78.2 kg)   Height: 5' 8.5" (1.74 m)    Body mass index is 25.83 kg/m.  Wt Readings from Last 3 Encounters:  01/24/23 172 lb 6.4 oz (78.2 kg)  10/13/22 167 lb (75.8 kg)  09/26/22 175 lb (79.4 kg)    General: Well developed, well nourished female in no apparent distress.  HEENT: AT/[place], no external lesions.  Eyes: Conjunctiva clear and no icterus. Neck: Neck supple  Lungs: Respirations not labored Neurologic: Alert, oriented, normal speech Extremities / Skin: Dry. No sores or rashes noted.  Psychiatric: Does not appear depressed or anxious  Diabetic Foot Exam - Simple   No data filed    LABS Reviewed Lab Results  Component Value Date   HGBA1C 6.4 01/19/2023   HGBA1C 9.5 (H) 08/11/2022   HGBA1C 6.7 (H) 04/11/2022   Lab Results  Component Value Date   FRUCTOSAMINE 237 03/28/2019   FRUCTOSAMINE 263 05/14/2018   FRUCTOSAMINE 328 (H) 07/07/2016   Lab Results  Component Value Date   CHOL 158 03/03/2022   HDL 55.10 03/03/2022   LDLCALC 68 03/03/2022   LDLDIRECT 89.0 11/30/2021   TRIG 178.0 (H) 03/03/2022   CHOLHDL 3 03/03/2022   Lab Results  Component Value Date   MICRALBCREAT 1.1 01/19/2023   MICRALBCREAT 7.6 05/30/2021   Lab Results  Component Value Date   CREATININE 1.03 01/19/2023   Lab Results  Component Value Date   GFR 52.46 (L) 01/19/2023    ASSESSMENT / PLAN  1. Controlled type 2 diabetes mellitus with complication, without long-term current use of insulin (HCC)     Diabetes Mellitus type 2, complicated by CKD /neuropathy. - Diabetic status / severity: Controlled.  Lab Results  Component Value Date   HGBA1C 6.4 01/19/2023    - Hemoglobin A1c goal : <7%  - Medications: No change, see below.  Amaryl 4 mg daily in the morning. Xigduo XR 12/998 mg  1  tablet daily. Mounjaro 10 mg weekly.  - Home glucose testing: In the morning fasting daily and at bedtime few times a week. - Discussed/ Gave Hypoglycemia treatment plan.  # Consult : not required at this time.   # Annual urine for microalbuminuria/ creatinine ratio, no microalbuminuria currently, continue ACE/ARB /lisinopril. Last  Lab Results  Component Value Date   MICRALBCREAT 1.1 01/19/2023    # Foot check nightly / neuropathy, continue gabapentin, managed by PCP.  # Annual dilated diabetic eye exams.   - Diet: Make healthy diabetic food choices - Life style / activity / exercise: Discussed.  2. Blood pressure  -  BP Readings from Last 1 Encounters:  01/24/23 130/70    - Control is in target.  - No change in current plans.  3. Lipid status / Hyperlipidemia - Last  Lab Results  Component Value Date   LDLCALC 68 03/03/2022   - Continue atorvastatin 40 mg daily.  Managed by primary care provider.  Diagnoses and all orders for this visit:  Controlled type 2 diabetes mellitus with complication, without long-term current use of insulin (HCC) -     Lipid panel; Future -     Microalbumin / creatinine urine ratio; Future -     Hemoglobin A1c; Future -     Basic metabolic panel; Future   Labs prior to follow-up visit.  DISPOSITION Follow up in clinic in 3 months suggested.   All questions answered and patient verbalized understanding of the plan.  Sandra Lauree Yurick, MD [facility] Endocrinology [facility] [address] [place], [address] [place], [place] [address] Phone # [phone]  At least part of this note was generated using voice recognition software. Inadvertent word errors may have occurred, which were not recognized during the proofreading process.

## 2023-01-24 NOTE — Patient Instructions (Signed)
Amaryl 4 mg daily in the morning. Xigduo XR 12/998 mg  1 tablet daily. Mounjaro 10 mg weekly.

## 2023-01-29 ENCOUNTER — Other Ambulatory Visit: Payer: Self-pay

## 2023-02-12 ENCOUNTER — Telehealth: Payer: Self-pay | Admitting: Endocrinology

## 2023-02-12 ENCOUNTER — Telehealth: Payer: Self-pay | Admitting: Nurse Practitioner

## 2023-02-12 NOTE — Telephone Encounter (Signed)
Refill request 90 days  gabapentin (NEURONTIN) 300 MG capsule [[id number]]  fenofibrate (TRICOR) 145 MG tablet [[id number]]  hydrochlorothiazide (HYDRODIURIL) 25 MG tablet [[id number]]  lisinopril (ZESTRIL) 5 MG tablet [[id number]]   [facility] - [place], [place] - [address] [place]  [address] Phone: [phone]  Fax: [phone]

## 2023-02-12 NOTE — Telephone Encounter (Signed)
MEDICATION: Glimepiride, Ondansetron,   PHARMACY:  [facility]  HAS THE PATIENT CONTACTED THEIR PHARMACY?  YES  IS THIS A 90 DAY SUPPLY : YES  IS PATIENT OUT OF MEDICATION: YES for Ondansetron, NO for Glimepiride  IF NOT; HOW MUCH IS LEFT: 0 and   LAST APPOINTMENT DATE: @11 /18/2024  NEXT APPOINTMENT DATE:@2 /12/2023  DO WE HAVE YOUR PERMISSION TO LEAVE A DETAILED MESSAGE?:  OTHER COMMENTS:    **Let patient know to contact pharmacy at the end of the day to make sure medication is ready. **  ** Please notify patient to allow 48-72 hours to process**  **Encourage patient to contact the pharmacy for refills or they can request refills through [facility]**

## 2023-02-13 ENCOUNTER — Other Ambulatory Visit: Payer: Self-pay

## 2023-02-13 DIAGNOSIS — E118 Type 2 diabetes mellitus with unspecified complications: Secondary | ICD-10-CM

## 2023-02-13 DIAGNOSIS — E785 Hyperlipidemia, unspecified: Secondary | ICD-10-CM

## 2023-02-13 DIAGNOSIS — I10 Essential (primary) hypertension: Secondary | ICD-10-CM

## 2023-02-13 DIAGNOSIS — E1151 Type 2 diabetes mellitus with diabetic peripheral angiopathy without gangrene: Secondary | ICD-10-CM

## 2023-02-13 MED ORDER — ONDANSETRON HCL 4 MG PO TABS: 4.0000 mg | ORAL_TABLET | Freq: Three times a day (TID) | ORAL | 1 refills | Status: DC | PRN

## 2023-02-13 MED ORDER — GLIMEPIRIDE 2 MG PO TABS: 2.0000 mg | ORAL_TABLET | Freq: Every day | ORAL | 2 refills | Status: DC

## 2023-02-13 NOTE — Telephone Encounter (Signed)
Follow up appointment is needed for RX refill. Patient message was sent to portal to schedule office visit.

## 2023-02-16 ENCOUNTER — Other Ambulatory Visit: Payer: Self-pay | Admitting: Nurse Practitioner

## 2023-02-16 DIAGNOSIS — I10 Essential (primary) hypertension: Secondary | ICD-10-CM

## 2023-02-16 DIAGNOSIS — E785 Hyperlipidemia, unspecified: Secondary | ICD-10-CM

## 2023-03-12 ENCOUNTER — Other Ambulatory Visit: Payer: Self-pay | Admitting: Nurse Practitioner

## 2023-03-12 DIAGNOSIS — E1151 Type 2 diabetes mellitus with diabetic peripheral angiopathy without gangrene: Secondary | ICD-10-CM

## 2023-03-12 DIAGNOSIS — I10 Essential (primary) hypertension: Secondary | ICD-10-CM

## 2023-03-12 DIAGNOSIS — E785 Hyperlipidemia, unspecified: Secondary | ICD-10-CM

## 2023-03-12 NOTE — Telephone Encounter (Signed)
Copied from CRM [id number]. Topic: Clinical - Medication Refill >> Mar 12, 2023  1:57 PM Maxwell Marion wrote: Most Recent Primary Care Visit:  Provider: Barb Merino  Department: [facility]  Visit Type: MEDICARE AWV, SEQUENTIAL  Date: 10/13/2022  Medication: fenofibrate (TRICOR) 145 MG  gabapentin (NEURONTIN) 300 MG hydrochlorothiazide (HYDRODIURIL) 25 MG  atorvastatin (LIPITOR) 40 MG  Has the patient contacted their pharmacy?  (Agent: If no, request that the patient contact the pharmacy for the refill. If patient does not wish to contact the pharmacy document the reason why and proceed with request.) (Agent: If yes, when and what did the pharmacy advise?)  Is this the correct pharmacy for this prescription? Yes If no, delete pharmacy and type the correct one.  This is the patient's preferred pharmacy:    [facility] [place] [place] - [address] [place] [address] Phone: [phone] Fax: [phone]   Has the prescription been filled recently?   Is the patient out of the medication?   Has the patient been seen for an appointment in the last year OR does the patient have an upcoming appointment?   Can we respond through MyChart?   Agent: Please be advised that Rx refills may take up to 3 business days. We ask that you follow-up with your pharmacy.

## 2023-04-19 ENCOUNTER — Other Ambulatory Visit: Payer: Self-pay

## 2023-04-19 DIAGNOSIS — E118 Type 2 diabetes mellitus with unspecified complications: Secondary | ICD-10-CM

## 2023-04-23 ENCOUNTER — Other Ambulatory Visit: Payer: Medicare Other

## 2023-04-23 DIAGNOSIS — E118 Type 2 diabetes mellitus with unspecified complications: Secondary | ICD-10-CM | POA: Diagnosis not present

## 2023-04-23 LAB — MICROALBUMIN / CREATININE URINE RATIO
Creatinine, Urine: 123 mg/dL (ref 20–275)
Microalb Creat Ratio: 8 mg/g{creat} (ref <30)
Microalb, Ur: 1 mg/dL

## 2023-04-23 LAB — BASIC METABOLIC PANEL
BUN/Creatinine Ratio: 33 (calc) — ABNORMAL HIGH (ref 6–22)
BUN: 26 mg/dL — ABNORMAL HIGH (ref 7–25)
CO2: 33 mmol/L — ABNORMAL HIGH (ref 20–32)
Calcium: 10 mg/dL (ref 8.6–10.4)
Chloride: 96 mmol/L — ABNORMAL LOW (ref 98–110)
Creat: 0.78 mg/dL (ref 0.60–1.00)
Glucose, Bld: 192 mg/dL — ABNORMAL HIGH (ref 65–99)
Potassium: 4.6 mmol/L (ref 3.5–5.3)
Sodium: 137 mmol/L (ref 135–146)

## 2023-04-23 LAB — HEMOGLOBIN A1C
Hgb A1c MFr Bld: 7.1 %{Hb} — ABNORMAL HIGH (ref <5.7)
Mean Plasma Glucose: 157 mg/dL
eAG (mmol/L): 8.7 mmol/L

## 2023-04-23 LAB — LIPID PANEL
Cholesterol: 141 mg/dL (ref <200)
HDL: 44 mg/dL — ABNORMAL LOW (ref >=50)
LDL Cholesterol (Calc): 70 mg/dL
Non-HDL Cholesterol (Calc): 97 mg/dL (ref <130)
Total CHOL/HDL Ratio: 3.2 (calc) (ref <5.0)
Triglycerides: 194 mg/dL — ABNORMAL HIGH (ref <150)

## 2023-04-24 ENCOUNTER — Encounter: Payer: Self-pay | Admitting: Endocrinology

## 2023-04-30 ENCOUNTER — Encounter: Payer: Self-pay | Admitting: Endocrinology

## 2023-04-30 ENCOUNTER — Ambulatory Visit: Payer: Medicare Other | Admitting: Endocrinology

## 2023-04-30 DIAGNOSIS — N189 Chronic kidney disease, unspecified: Secondary | ICD-10-CM

## 2023-04-30 DIAGNOSIS — E118 Type 2 diabetes mellitus with unspecified complications: Secondary | ICD-10-CM

## 2023-04-30 DIAGNOSIS — E1140 Type 2 diabetes mellitus with diabetic neuropathy, unspecified: Secondary | ICD-10-CM | POA: Diagnosis not present

## 2023-04-30 DIAGNOSIS — Z7984 Long term (current) use of oral hypoglycemic drugs: Secondary | ICD-10-CM

## 2023-04-30 DIAGNOSIS — E1122 Type 2 diabetes mellitus with diabetic chronic kidney disease: Secondary | ICD-10-CM | POA: Diagnosis not present

## 2023-04-30 DIAGNOSIS — Z7985 Long-term (current) use of injectable non-insulin antidiabetic drugs: Secondary | ICD-10-CM | POA: Diagnosis not present

## 2023-04-30 DIAGNOSIS — E1165 Type 2 diabetes mellitus with hyperglycemia: Secondary | ICD-10-CM

## 2023-04-30 MED ORDER — TIRZEPATIDE 10 MG/0.5ML ~~LOC~~ SOAJ: 10.0000 mg | SUBCUTANEOUS | 3 refills | Status: DC

## 2023-04-30 MED ORDER — GLIMEPIRIDE 2 MG PO TABS: 4.0000 mg | ORAL_TABLET | Freq: Every day | ORAL | 2 refills | Status: DC

## 2023-04-30 MED ORDER — TIRZEPATIDE 2.5 MG/0.5ML ~~LOC~~ SOAJ: 2.5000 mg | SUBCUTANEOUS | 0 refills | Status: DC

## 2023-04-30 MED ORDER — TIRZEPATIDE 5 MG/0.5ML ~~LOC~~ SOAJ: 5.0000 mg | SUBCUTANEOUS | 4 refills | Status: DC

## 2023-04-30 MED ORDER — DAPAGLIFLOZIN PROPANEDIOL 10 MG PO TABS: 10.0000 mg | ORAL_TABLET | Freq: Every day | ORAL | 3 refills | Status: DC

## 2023-04-30 NOTE — Progress Notes (Signed)
Outpatient Endocrinology Note [name], MD  04/30/23  Patient's Name: Sandra Ray    DOB: [birth year]    MRN: [id number]                                                    REASON OF VISIT: Follow up of type 2 diabetes mellitus  PCP: Elease Etienne Bonna Gains, NP  HISTORY OF PRESENT ILLNESS:   Sandra Ray is a 78 y.o. old female with past medical history listed below, is here for follow up for type 2 diabetes mellitus.    Pertinent Diabetes History: Patient was diagnosed with type 2 diabetes mellitus in 1996.  Chronic Diabetes Complications : Retinopathy: no. Last ophthalmology exam was done on annually, Due, following with ophthalmology regularly.  Nephropathy: CKD III, on ACE/ARB / lisinopril.  Peripheral neuropathy: yes, on gabapentin 300 mg 3 times a day from PCP.  Had back surgery in September 2023. Coronary artery disease: no Stroke: no  Relevant comorbidities and cardiovascular risk factors: Obesity: no Body mass index is 23.97 kg/m.  Hypertension: Yes  Hyperlipidemia : Yes, on statin   Current / Home Diabetic regimen includes: Amaryl 2 mg daily in the morning. Xigduo XR 12/998 mg  1 tablet daily. Mounjaro 10 mg weekly. (Currently not taking for about 3 months.)  Prior diabetic medications: She had taken glipizide and Actos in the past.  Actos was stopped due to significant weight gain.  She had taken Januvia, Tradjenta and metformin in the past.  Nausea with high dose of metformin.  Glycemic data:   One Touch Verio reflect glucometer.  Reviewed from February 3 to April 30, 2023 average blood sugar 158.  She has been checking blood sugar mostly in the morning fasting some of the blood sugar 133, 155, 151, 156, 125, 123, 152, 182, 203, 172, 183.  Mostly hyperglycemia.  Hypoglycemia: Patient has no hypoglycemic episodes. Patient has hypoglycemia awareness.  Factors modifying glucose control: 1.  Diabetic diet assessment: 3 meals a day.  2.  Staying active or  exercising: No formal exercise.  3.  Medication compliance: compliant all of the time.  Interval history  Diabetes regimen reviewed and as noted above.  She is no longer taking Mounjaro, not covered by insurance and has not been taking from last visit.  She has been taking Amaryl and Xigduo.  She complains of diarrhea persistently multiple times a day, usually once immediately after eating.  Recent laboratory results reviewed.  Hemoglobin A1c 7.1%.  Electrolytes and lipids level acceptable.  Urine microalbumin creatinine ratio normal.  She has numbness and tingling of the feet.  No other complaints today.   REVIEW OF SYSTEMS As per history of present illness.   PAST MEDICAL HISTORY: Past Medical History:  Diagnosis Date   Arthritis    Breast abscess 2000   right breast   Carpal tunnel syndrome    Complication of anesthesia    Degenerative disc disease    Diabetes mellitus type II    Eczema of hand    Hx of colonic polyps 12/02/2014   2004 - 2 diminutive polyps    Hyperlipidemia    Hypertension    Metatarsal deformity    Ovarian cyst 07/2017   left    PONV (postoperative nausea and vomiting)    Shingles 2000   Tubular adenoma of  colon     PAST SURGICAL HISTORY: Past Surgical History:  Procedure Laterality Date   ABDOMINAL HYSTERECTOMY  1981   menorrhagia   ABSCESS DRAINAGE Right    breast   APPENDECTOMY  1966   done with CSxn. Prophylactic   BREAST BIOPSY Left    carpel tunnel Right    CESAREAN SECTION     x2   COLONOSCOPY     LUMBAR LAMINECTOMY     9562,1308. 2011 L4,L5,S1   Right rotator cuff repair 09/2015     ROBOTIC ASSISTED SALPINGO OOPHERECTOMY Left 04/16/2018   Procedure: XI ROBOTIC ASSISTED  LEFT SALPINGO OOPHORECTOMY;  Surgeon: Shonna Chock, MD;  Location: [facility];  Service: Gynecology;  Laterality: Left;   SPINAL CORD STIMULATOR INSERTION N/A 12/08/2021   Procedure: PLACEMENT OF SPINAL CORD STIMULATOR;  Surgeon: Venita Lick, MD;  Location: [facility]  OR;  Service: Orthopedics;  Laterality: N/A;  2.5 hrs 3 C-Bed   TUBAL LIGATION      ALLERGIES: Allergies  Allergen Reactions   Penicillins     Causes yeast infections Did it involve swelling of the face/tongue/throat, SOB, or low BP? No Did it involve sudden or severe rash/hives, skin peeling, or any reaction on the inside of your mouth or nose? No Did you need to seek medical attention at a hospital or doctor's office? No When did it last happen?  2018     If all above answers are "NO", may proceed with cephalosporin use.  Tolerated cefazolin in 2020   Amoxicillin Other (See Comments)    Pt states it causes her a yeast infection  Causes yeast infections Did it involve swelling of the face/tongue/throat, SOB, or low BP? No Did it involve sudden or severe rash/hives, skin peeling, or any reaction on the inside of your mouth or nose? No Did you need to seek medical attention at a hospital or doctor's office? No When did it last happen?  2018     If all above answers are "NO", may proceed with cephalosporin use.  Tolerated cefazolin in 2020   Erythromycin     Passed out while taking    FAMILY HISTORY:  Family History  Problem Relation Age of Onset   Hypertension Father    Diabetes Mother    Stroke Mother    Hypertension Mother    High Cholesterol Mother    Heart disease Mother    Diabetes Brother    Breast cancer Daughter 38       unsure if did genetic testing   Breast cancer Maternal Aunt    Breast cancer Sister 34   Colon polyps Brother     SOCIAL HISTORY: Social History   Socioeconomic History   Marital status: Married    Spouse name: Not on file   Number of children: 2   Years of education: Not on file   Highest education level: Not on file  Occupational History   Not on file  Tobacco Use   Smoking status: Never   Smokeless tobacco: Never  Vaping Use   Vaping status: Never Used  Substance and Sexual Activity   Alcohol use: No   Drug use: No   Sexual  activity: Not Currently  Other Topics Concern   Not on file  Social History Narrative   Married 2 daughters   Not working outside home   no caffeine   12/02/2014   Social Drivers of Health   Financial Resource Strain: Low Risk  (10/13/2022)   Overall Financial  Resource Strain (CARDIA)    Difficulty of Paying Living Expenses: Not hard at all  Food Insecurity: No Food Insecurity (10/13/2022)   Hunger Vital Sign    Worried About Running Out of Food in the Last Year: Never true    Ran Out of Food in the Last Year: Never true  Transportation Needs: No Transportation Needs (10/13/2022)   PRAPARE - Administrator, Civil Service (Medical): No    Lack of Transportation (Non-Medical): No  Physical Activity: Inactive (10/13/2022)   Exercise Vital Sign    Days of Exercise per Week: 0 days    Minutes of Exercise per Session: 0 min  Stress: No Stress Concern Present (10/13/2022)   [employer] of Occupational Health - Occupational Stress Questionnaire    Feeling of Stress : Not at all  Social Connections: Moderately Integrated (10/13/2022)   Social Connection and Isolation Panel [NHANES]    Frequency of Communication with Friends and Family: Twice a week    Frequency of Social Gatherings with Friends and Family: Once a week    Attends Religious Services: More than 4 times per year    Active Member of [employer] or Organizations: No    Attends Banker Meetings: Never    Marital Status: Married    MEDICATIONS:  Current Outpatient Medications  Medication Sig Dispense Refill   acetaminophen (TYLENOL) 650 MG CR tablet Take 1,300 mg by mouth 2 (two) times daily.     atorvastatin (LIPITOR) 40 MG tablet TAKE 1 TABLET BY MOUTH DAILY AT  6 PM 20 tablet 17   calcium carbonate (TUMS - DOSED IN MG ELEMENTAL CALCIUM) 500 MG chewable tablet Chew 1-2 tablets by mouth daily as needed for indigestion or heartburn.     cyanocobalamin (VITAMIN B12) 1000 MCG tablet Take 1,000 mcg by mouth daily.      dapagliflozin propanediol (FARXIGA) 10 MG TABS tablet Take 1 tablet (10 mg total) by mouth daily before breakfast. 90 tablet 3   diphenhydramine-acetaminophen (TYLENOL PM) 25-500 MG TABS tablet Take 3 tablets by mouth at bedtime.     fenofibrate (TRICOR) 145 MG tablet TAKE 1 TABLET BY MOUTH DAILY 100 tablet 2   fluticasone (FLONASE) 50 MCG/ACT nasal spray Place 2 sprays into both nostrils daily. (Patient taking differently: Place 1 spray into both nostrils daily as needed for allergies.) 16 g 0   gabapentin (NEURONTIN) 300 MG capsule TAKE 1 CAPSULE BY MOUTH 3  TIMES DAILY 240 capsule 3   hydrochlorothiazide (HYDRODIURIL) 25 MG tablet TAKE 1 TABLET BY MOUTH  DAILY 100 tablet 2   Lancets (ONETOUCH ULTRASOFT) lancets Use as instructed once day.  E11.40 100 each 5   lisinopril (ZESTRIL) 5 MG tablet TAKE 1 TABLET BY MOUTH DAILY 100 tablet 2   ondansetron (ZOFRAN) 4 MG tablet Take 1 tablet (4 mg total) by mouth as needed for nausea or vomiting. 30 tablet 1   ondansetron (ZOFRAN) 4 MG tablet Take 1 tablet (4 mg total) by mouth every 8 (eight) hours as needed for nausea or vomiting. 20 tablet 1   ONETOUCH VERIO test strip USE AS INSTRUCTED TO CHECK BLOOD SUGAR TWICE DAILY 200 strip 2   oxyCODONE-acetaminophen (PERCOCET/ROXICET) 5-325 MG tablet Take 1-2 tablets by mouth every 8 (eight) hours as needed for severe pain. 12 tablet 0   tirzepatide (MOUNJARO) 2.5 MG/0.5ML Pen Inject 2.5 mg into the skin once a week. 2 mL 0   tirzepatide (MOUNJARO) 5 MG/0.5ML Pen Inject 5 mg into the skin  once a week. After 4 weeks of 2.5mg , 6 mL 4   glimepiride (AMARYL) 2 MG tablet Take 2 tablets (4 mg total) by mouth daily before breakfast. 200 tablet 2   No current facility-administered medications for this visit.   Facility-Administered Medications Ordered in Other Visits  Medication Dose Route Frequency Provider Last Rate Last Admin   vancomycin (VANCOREADY) IVPB 1500 mg/300 mL  1,500 mg Intravenous Once Venita Lick, MD        PHYSICAL EXAM: Vitals:   04/30/23 0952  BP: 130/72  Pulse: 76  Resp: 20  SpO2: 99%  Weight: 160 lb (72.6 kg)  Height: 5' 8.5" (1.74 m)   Body mass index is 23.97 kg/m.  Wt Readings from Last 3 Encounters:  04/30/23 160 lb (72.6 kg)  01/24/23 172 lb 6.4 oz (78.2 kg)  10/13/22 167 lb (75.8 kg)    General: Well developed, well nourished female in no apparent distress.  HEENT: AT/, no external lesions.  Eyes: Conjunctiva clear and no icterus. Neck: Neck supple  Lungs: Respirations not labored Neurologic: Alert, oriented, normal speech Extremities / Skin: Dry.  Psychiatric: Does not appear depressed or anxious  Diabetic Foot Exam - Simple   Simple Foot Form Diabetic Foot exam was performed with the following findings: Yes 04/30/2023 10:30 AM  Visual Inspection See comments: Yes Sensation Testing See comments: Yes Pulse Check See comments: Yes Comments DP palpable bilaterally. Dystrophic nails +, callus +, hammer toes on right foot.  Monofilament exam absent bilaterally.     LABS Reviewed Lab Results  Component Value Date   HGBA1C 7.1 (H) 04/23/2023   HGBA1C 6.4 01/19/2023   HGBA1C 9.5 (H) 08/11/2022   Lab Results  Component Value Date   FRUCTOSAMINE 237 03/28/2019   FRUCTOSAMINE 263 05/14/2018   FRUCTOSAMINE 328 (H) 07/07/2016   Lab Results  Component Value Date   CHOL 141 04/23/2023   HDL 44 (L) 04/23/2023   LDLCALC 70 04/23/2023   LDLDIRECT 89.0 11/30/2021   TRIG 194 (H) 04/23/2023   CHOLHDL 3.2 04/23/2023   Lab Results  Component Value Date   MICRALBCREAT 8 04/23/2023   MICRALBCREAT 1.1 01/19/2023   Lab Results  Component Value Date   CREATININE 0.78 04/23/2023   Lab Results  Component Value Date   GFR 52.46 (L) 01/19/2023    ASSESSMENT / PLAN  1. Controlled type 2 diabetes mellitus with complication, without long-term current use of insulin (HCC)   2. Type 2 diabetes mellitus with hyperglycemia, without long-term  current use of insulin (HCC)     Diabetes Mellitus type 2, complicated by CKD /neuropathy. - Diabetic status / severity: Controlled.  Lab Results  Component Value Date   HGBA1C 7.1 (H) 04/23/2023    - Hemoglobin A1c goal : <7%  - Medications: see below.  Increased Amaryl 2 mg to 4 mg daily in the morning. Stop Xigduo XR 12/998 mg  1 tablet daily, due to diarrhea and start Farxiga only, mainly stopping metformin. Start Farxiga 10 mg daily.  She is no longer taking Mounjaro.  Reordered Mounjaro 2.5 mg weekly for 4 weeks and increase to 5 mg weekly if covered by medical insurance.   - Home glucose testing: In the morning fasting daily and at bedtime few times a week. - Discussed/ Gave Hypoglycemia treatment plan.  # Consult : not required at this time.   # Annual urine for microalbuminuria/ creatinine ratio, no microalbuminuria currently, continue ACE/ARB /lisinopril. Last  Lab Results  Component Value Date  MICRALBCREAT 8 04/23/2023    # Foot check nightly / neuropathy, continue gabapentin, managed by PCP.  # Annual dilated diabetic eye exams.   - Diet: Make healthy diabetic food choices - Life style / activity / exercise: Discussed.  2. Blood pressure  -  BP Readings from Last 1 Encounters:  04/30/23 130/72    - Control is in target.  - No change in current plans.  3. Lipid status / Hyperlipidemia - Last  Lab Results  Component Value Date   LDLCALC 70 04/23/2023   - Continue atorvastatin 40 mg daily.  Managed by primary care provider.  Diagnoses and all orders for this visit:  Controlled type 2 diabetes mellitus with complication, without long-term current use of insulin (HCC) -     glimepiride (AMARYL) 2 MG tablet; Take 2 tablets (4 mg total) by mouth daily before breakfast.  Type 2 diabetes mellitus with hyperglycemia, without long-term current use of insulin (HCC) -     dapagliflozin propanediol (FARXIGA) 10 MG TABS tablet; Take 1 tablet (10 mg  total) by mouth daily before breakfast. -     Discontinue: tirzepatide (MOUNJARO) 10 MG/0.5ML Pen; Inject 10 mg into the skin once a week. -     tirzepatide [facility]) 2.5 MG/0.5ML Pen; Inject 2.5 mg into the skin once a week. -     tirzepatide [facility]) 5 MG/0.5ML Pen; Inject 5 mg into the skin once a week. After 4 weeks of 2.5mg ,    DISPOSITION Follow up in clinic in 3 months suggested.   All questions answered and patient verbalized understanding of the plan.  [place] Rodrigus Kilker, MD [facility] Endocrinology [facility] [address] [place], [address] [place], [place] [address] Phone # [phone]  At least part of this note was generated using voice recognition software. Inadvertent word errors may have occurred, which were not recognized during the proofreading process.

## 2023-04-30 NOTE — Patient Instructions (Addendum)
Increase Amaryl 2 mg to 4 mg daily in the morning. Stop Xiduo. Start farxiga 10 mg daily. Restart 2.5 mg weekly for 4 weeks and increase to 5 mg weekly, if covered by insurance.

## 2023-05-02 ENCOUNTER — Other Ambulatory Visit: Payer: Self-pay | Admitting: Nurse Practitioner

## 2023-05-02 DIAGNOSIS — E785 Hyperlipidemia, unspecified: Secondary | ICD-10-CM

## 2023-05-02 DIAGNOSIS — E1151 Type 2 diabetes mellitus with diabetic peripheral angiopathy without gangrene: Secondary | ICD-10-CM

## 2023-05-02 DIAGNOSIS — I10 Essential (primary) hypertension: Secondary | ICD-10-CM

## 2023-05-02 MED ORDER — GABAPENTIN 300 MG PO CAPS: 300.0000 mg | ORAL_CAPSULE | Freq: Three times a day (TID) | ORAL | 0 refills | Status: DC

## 2023-05-02 MED ORDER — FENOFIBRATE 145 MG PO TABS: 145.0000 mg | ORAL_TABLET | Freq: Every day | ORAL | 0 refills | Status: DC

## 2023-05-02 MED ORDER — HYDROCHLOROTHIAZIDE 25 MG PO TABS: 25.0000 mg | ORAL_TABLET | Freq: Every day | ORAL | 0 refills | Status: DC

## 2023-05-02 MED ORDER — LISINOPRIL 5 MG PO TABS: 5.0000 mg | ORAL_TABLET | Freq: Every day | ORAL | 0 refills | Status: DC

## 2023-05-02 NOTE — Telephone Encounter (Signed)
Last OV: 04/30/23 Refills left: 0 Tricor 145mg  expired 11/ 29/24, Neurontin 300mg  expired 11/04/22, Hydrodiuril 25mg  expired 09/14/22, pharmacy cannot fill. This RN contacted the pharmacy regarding lisinopril 5 mg, and the pharmacy confirmed that patient last received med in October 2024 and has no refills remaining. Per chart notes, patient is required to have an OV with NP Claris Gower to reorder medications. This RN contacted patient who states that she was unable to talk to NP [facility] after her OV on 04/30/23 and thought that the meds would be reordered since "they could see that they were expired and knew I needed them." OV made for 3/11 and waitlisted as patient states that she needs an earlier appointment since she is out of her meds and will be difficult to wait that long. Patient advised that this RN will route note to practice with request for possible earlier appointment.

## 2023-05-02 NOTE — Telephone Encounter (Signed)
Copied from CRM [id number]. Topic: Clinical - Medication Refill >> May 02, 2023  1:41 PM Gurney Maxin H wrote: Most Recent Primary Care Visit:  Provider: Barb Merino  Department: [facility]  Visit Type: MEDICARE AWV, SEQUENTIAL  Date: 10/13/2022  Medication: fenofibrate (TRICOR) 145 MG tablet gabapentin (NEURONTIN) 300 MG capsule hydrochlorothiazide (HYDRODIURIL) 25 MG tablet lisinopril (ZESTRIL) 5 MG tablet  Has the patient contacted their pharmacy? Yes, was supposed to be ordered (Agent: If no, request that the patient contact the pharmacy for the refill. If patient does not wish to contact the pharmacy document the reason why and proceed with request.) (Agent: If yes, when and what did the pharmacy advise?)  Is this the correct pharmacy for this prescription? Yes If no, delete pharmacy and type the correct one.  This is the patient's preferred pharmacy:  [facility]) - [place], [place] - [address] [facility] [address] [place] [address] [place] [address] Phone: [phone] Fax: [phone]     Has the prescription been filled recently? No  Is the patient out of the medication? Yes  Has the patient been seen for an appointment in the last year OR does the patient have an upcoming appointment? Yes  Can we respond through MyChart? Yes  Agent: Please be advised that Rx refills may take up to 3 business days. We ask that you follow-up with your pharmacy.

## 2023-05-22 ENCOUNTER — Encounter: Payer: Self-pay | Admitting: Nurse Practitioner

## 2023-05-22 ENCOUNTER — Ambulatory Visit (INDEPENDENT_AMBULATORY_CARE_PROVIDER_SITE_OTHER): Payer: Medicare Other | Admitting: Nurse Practitioner

## 2023-05-22 VITALS — BP 138/72 | HR 65 | Temp 97.1°F | Ht 68.5 in | Wt 166.6 lb

## 2023-05-22 DIAGNOSIS — R051 Acute cough: Secondary | ICD-10-CM | POA: Diagnosis not present

## 2023-05-22 DIAGNOSIS — I10 Essential (primary) hypertension: Secondary | ICD-10-CM | POA: Diagnosis not present

## 2023-05-22 DIAGNOSIS — E119 Type 2 diabetes mellitus without complications: Secondary | ICD-10-CM | POA: Insufficient documentation

## 2023-05-22 DIAGNOSIS — E785 Hyperlipidemia, unspecified: Secondary | ICD-10-CM | POA: Diagnosis not present

## 2023-05-22 DIAGNOSIS — E1165 Type 2 diabetes mellitus with hyperglycemia: Secondary | ICD-10-CM

## 2023-05-22 DIAGNOSIS — E1169 Type 2 diabetes mellitus with other specified complication: Secondary | ICD-10-CM

## 2023-05-22 DIAGNOSIS — R1013 Epigastric pain: Secondary | ICD-10-CM | POA: Insufficient documentation

## 2023-05-22 DIAGNOSIS — A048 Other specified bacterial intestinal infections: Secondary | ICD-10-CM

## 2023-05-22 DIAGNOSIS — J309 Allergic rhinitis, unspecified: Secondary | ICD-10-CM | POA: Diagnosis not present

## 2023-05-22 DIAGNOSIS — Z7985 Long-term (current) use of injectable non-insulin antidiabetic drugs: Secondary | ICD-10-CM

## 2023-05-22 DIAGNOSIS — Z7984 Long term (current) use of oral hypoglycemic drugs: Secondary | ICD-10-CM

## 2023-05-22 MED ORDER — FAMOTIDINE 20 MG PO TABS: 20.0000 mg | ORAL_TABLET | Freq: Two times a day (BID) | ORAL | 0 refills | Status: DC | PRN

## 2023-05-22 MED ORDER — HYDROCHLOROTHIAZIDE 25 MG PO TABS: 25.0000 mg | ORAL_TABLET | Freq: Every day | ORAL | 0 refills | Status: DC

## 2023-05-22 MED ORDER — PROMETHAZINE-DM 6.25-15 MG/5ML PO SYRP: 5.0000 mL | ORAL_SOLUTION | Freq: Three times a day (TID) | ORAL | 0 refills | Status: DC | PRN

## 2023-05-22 MED ORDER — ATORVASTATIN CALCIUM 40 MG PO TABS: 40.0000 mg | ORAL_TABLET | Freq: Every day | ORAL | 3 refills | Status: DC

## 2023-05-22 MED ORDER — ONDANSETRON HCL 4 MG PO TABS: 4.0000 mg | ORAL_TABLET | Freq: Three times a day (TID) | ORAL | 0 refills | Status: DC | PRN

## 2023-05-22 MED ORDER — LISINOPRIL 5 MG PO TABS: 5.0000 mg | ORAL_TABLET | Freq: Every day | ORAL | 0 refills | Status: DC

## 2023-05-22 MED ORDER — FLUTICASONE PROPIONATE 50 MCG/ACT NA SUSP: 2.0000 | Freq: Every day | NASAL | 1 refills | Status: AC

## 2023-05-22 MED ORDER — FENOFIBRATE 145 MG PO TABS: 145.0000 mg | ORAL_TABLET | Freq: Every day | ORAL | 0 refills | Status: DC

## 2023-05-22 NOTE — Patient Instructions (Addendum)
 Schedule appointment with ophthalmology for annual DIABETES eye exam Go to lab Use famotidine 20mg  BID as needed. Hold antacid Do not use zofran while using promethazine DIABETES for cough.

## 2023-05-22 NOTE — Assessment & Plan Note (Addendum)
 Chronic, intermittent 1-2x/week, nausea and heartburn, use of antacid and zofran prn. No previous EDG No melena, no epigastric pain Current use of GLP-1RA for DIABETES management. Gallbladder present.  Possibly exacerbated by use of GLP-1RA Advised to use famotidine 20mg  BID prn in place of antacid Check H pylori Sent zofran for prn use Advised not to use zofran while using promethazine Dm for cough.

## 2023-05-22 NOTE — Progress Notes (Signed)
 Established Patient Visit  Patient: Sandra Ray   DOB: 1945-03-28   78 y.o. Female  MRN: [id number] Visit Date: 05/22/2023  Subjective:    Chief Complaint  Patient presents with   Medication Management    Refills needed on all medications prescribed by provider    URI  This is a new problem. The current episode started in the past 7 days. The problem has been unchanged. There has been no fever. Associated symptoms include congestion, coughing and rhinorrhea. Pertinent negatives include no abdominal pain, chest pain, diarrhea, dysuria, ear pain, headaches, joint pain, joint swelling, nausea, neck pain, plugged ear sensation, rash, sinus pain, sneezing, sore throat, swollen glands, vomiting or wheezing. She has tried nothing (no improvement with benzonatate in the past) for the symptoms.   Dyspepsia Chronic, intermittent 1-2x/week, nausea and heartburn, use of antacid and zofran prn. No previous EDG No melena, no epigastric pain Current use of GLP-1RA for DIABETES management. Gallbladder present.  Possibly exacerbated by use of GLP-1RA Advised to use famotidine 20mg  BID prn in place of antacid Check H pylori Sent zofran for prn use Advised not to use zofran while using promethazine Dm for cough.  Hyperlipidemia LDL goal <70 Repeat lipid panel: LDL at goal with elevated triglyceride. Maintain atorvastatin and fenofibrate dose Repeat lipid panel in 6months  Essential hypertension, benign BP at goal with lisinopril and hctz BP Readings from Last 3 Encounters:  05/22/23 138/72  04/30/23 130/72  01/24/23 130/70   Stable renal function Maintain med doses  Diabetes mellitus type II, controlled (HCC) Advised to schedule annual DIABETES eye exam. Under the care of endocrinology Current use of farxiga, glimepiride, and mounjaro Current use of ACE-I and statin LDL at goal Neuropathy controlled with gabapentin Normal UACr Unable to tolerate metformin-severs  diarrhea  Wt Readings from Last 3 Encounters:  05/22/23 166 lb 9.6 oz (75.6 kg)  04/30/23 160 lb (72.6 kg)  01/24/23 172 lb 6.4 oz (78.2 kg)    Reviewed medical, surgical, and social history today  Medications: Outpatient Medications Prior to Visit  Medication Sig Note   acetaminophen (TYLENOL) 650 MG CR tablet Take 1,300 mg by mouth 2 (two) times daily.    calcium carbonate (TUMS - DOSED IN MG ELEMENTAL CALCIUM) 500 MG chewable tablet Chew 1-2 tablets by mouth daily as needed for indigestion or heartburn.    cyanocobalamin (VITAMIN B12) 1000 MCG tablet Take 1,000 mcg by mouth daily.    diphenhydramine-acetaminophen (TYLENOL PM) 25-500 MG TABS tablet Take 3 tablets by mouth at bedtime.    gabapentin (NEURONTIN) 300 MG capsule Take 1 capsule (300 mg total) by mouth 3 (three) times daily. Needs to maintain appointment with pcp on 05/22/2023 for additional refills    glimepiride (AMARYL) 2 MG tablet Take 2 tablets (4 mg total) by mouth daily before breakfast.    Lancets (ONETOUCH ULTRASOFT) lancets Use as instructed once day.  E11.40    ONETOUCH VERIO test strip USE AS INSTRUCTED TO CHECK BLOOD SUGAR TWICE DAILY    tirzepatide (MOUNJARO) 2.5 MG/0.5ML Pen Inject 2.5 mg into the skin once a week.    [DISCONTINUED] atorvastatin (LIPITOR) 40 MG tablet TAKE 1 TABLET BY MOUTH DAILY AT  6 PM    [DISCONTINUED] fenofibrate (TRICOR) 145 MG tablet Take 1 tablet (145 mg total) by mouth daily. Needs to maintain appointment with pcp on 05/22/2023 for additional refills    [DISCONTINUED] fluticasone (FLONASE) 50  MCG/ACT nasal spray Place 2 sprays into both nostrils daily. (Patient taking differently: Place 1 spray into both nostrils daily as needed for allergies.)    [DISCONTINUED] hydrochlorothiazide (HYDRODIURIL) 25 MG tablet Take 1 tablet (25 mg total) by mouth daily. Needs to maintain appointment with pcp on 05/22/2023 for additional refills    [DISCONTINUED] lisinopril (ZESTRIL) 5 MG tablet Take 1 tablet (5  mg total) by mouth daily. Needs to maintain appointment with pcp on 05/22/2023 for additional refills    [DISCONTINUED] ondansetron (ZOFRAN) 4 MG tablet Take 1 tablet (4 mg total) by mouth every 8 (eight) hours as needed for nausea or vomiting.    dapagliflozin propanediol (FARXIGA) 10 MG TABS tablet Take 1 tablet (10 mg total) by mouth daily before breakfast.    tirzepatide (MOUNJARO) 5 MG/0.5ML Pen Inject 5 mg into the skin once a week. After 4 weeks of 2.5mg , 05/22/2023: Has not started yet    [DISCONTINUED] dapagliflozin propanediol (FARXIGA) 10 MG TABS tablet Take 1 tablet (10 mg total) by mouth daily before breakfast. (Patient not taking: Reported on 05/22/2023) 05/22/2023: Has not started yet    [DISCONTINUED] ondansetron (ZOFRAN) 4 MG tablet Take 1 tablet (4 mg total) by mouth as needed for nausea or vomiting.    [DISCONTINUED] oxyCODONE-acetaminophen (PERCOCET/ROXICET) 5-325 MG tablet Take 1-2 tablets by mouth every 8 (eight) hours as needed for severe pain. (Patient not taking: Reported on 05/22/2023)    Facility-Administered Medications Prior to Visit  Medication Dose Route Frequency Provider   vancomycin (VANCOREADY) IVPB 1500 mg/300 mL  1,500 mg Intravenous Once Venita Lick, MD   Reviewed past medical and social history.   ROS per HPI above  Last CBC Lab Results  Component Value Date   WBC 19.3 (H) 02/07/2022   HGB 14.5 02/07/2022   HCT 43.5 02/07/2022   MCV 92.9 02/07/2022   MCH 31.0 02/07/2022   RDW 14.0 02/07/2022   PLT 324 02/07/2022   Last metabolic panel Lab Results  Component Value Date   GLUCOSE 192 (H) 04/23/2023   NA 137 04/23/2023   K 4.6 04/23/2023   CL 96 (L) 04/23/2023   CO2 33 (H) 04/23/2023   BUN 26 (H) 04/23/2023   CREATININE 0.78 04/23/2023   GFR 52.46 (L) 01/19/2023   CALCIUM 10.0 04/23/2023   PROT 7.7 02/07/2022   ALBUMIN 4.4 02/07/2022   BILITOT 0.7 02/07/2022   ALKPHOS 54 02/07/2022   AST 27 02/07/2022   ALT 15 02/07/2022   ANIONGAP 14  02/07/2022   Last lipids Lab Results  Component Value Date   CHOL 141 04/23/2023   HDL 44 (L) 04/23/2023   LDLCALC 70 04/23/2023   LDLDIRECT 89.0 11/30/2021   TRIG 194 (H) 04/23/2023   CHOLHDL 3.2 04/23/2023   Last hemoglobin A1c Lab Results  Component Value Date   HGBA1C 7.1 (H) 04/23/2023   Last thyroid functions Lab Results  Component Value Date   TSH 1.69 02/02/2020      Objective:  BP 138/72 (BP Location: Left Arm, Patient Position: Sitting, Cuff Size: Normal)   Pulse 65   Temp (!) 97.1 F (36.2 C) (Temporal)   Ht 5' 8.5" (1.74 m)   Wt 166 lb 9.6 oz (75.6 kg)   SpO2 100%   BMI 24.96 kg/m      Physical Exam Vitals and nursing note reviewed.  HENT:     Nose: Congestion and rhinorrhea present.  Eyes:     Extraocular Movements: Extraocular movements intact.     Conjunctiva/sclera:  Conjunctivae normal.  Cardiovascular:     Rate and Rhythm: Normal rate and regular rhythm.     Pulses: Normal pulses.     Heart sounds: Normal heart sounds.  Pulmonary:     Effort: Pulmonary effort is normal.     Breath sounds: Normal breath sounds.  Abdominal:     General: Bowel sounds are normal.     Palpations: Abdomen is soft.     Tenderness: There is no abdominal tenderness.  Neurological:     Mental Status: She is alert and oriented to person, place, and time.  Psychiatric:        Mood and Affect: Mood normal.        Behavior: Behavior normal.        Thought Content: Thought content normal.     No results found for any visits on 05/22/23.    Assessment & Plan:    Problem List Items Addressed This Visit     Diabetes mellitus type II, controlled (HCC)   Advised to schedule annual DIABETES eye exam. Under the care of endocrinology Current use of farxiga, glimepiride, and mounjaro Current use of ACE-I and statin LDL at goal Neuropathy controlled with gabapentin Normal UACr Unable to tolerate metformin-severs diarrhea      Relevant Medications   dapagliflozin  propanediol (FARXIGA) 10 MG TABS tablet   lisinopril (ZESTRIL) 5 MG tablet   atorvastatin (LIPITOR) 40 MG tablet   ondansetron (ZOFRAN) 4 MG tablet   Dyspepsia   Chronic, intermittent 1-2x/week, nausea and heartburn, use of antacid and zofran prn. No previous EDG No melena, no epigastric pain Current use of GLP-1RA for DIABETES management. Gallbladder present.  Possibly exacerbated by use of GLP-1RA Advised to use famotidine 20mg  BID prn in place of antacid Check H pylori Sent zofran for prn use Advised not to use zofran while using promethazine Dm for cough.      Relevant Medications   famotidine (PEPCID) 20 MG tablet   Other Relevant Orders   H. pylori breath test   Essential hypertension, benign - Primary   BP at goal with lisinopril and hctz BP Readings from Last 3 Encounters:  05/22/23 138/72  04/30/23 130/72  01/24/23 130/70   Stable renal function Maintain med doses      Relevant Medications   lisinopril (ZESTRIL) 5 MG tablet   hydrochlorothiazide (HYDRODIURIL) 25 MG tablet   fenofibrate (TRICOR) 145 MG tablet   atorvastatin (LIPITOR) 40 MG tablet   Hyperlipidemia LDL goal <70   Repeat lipid panel: LDL at goal with elevated triglyceride. Maintain atorvastatin and fenofibrate dose Repeat lipid panel in 6months      Relevant Medications   lisinopril (ZESTRIL) 5 MG tablet   hydrochlorothiazide (HYDRODIURIL) 25 MG tablet   fenofibrate (TRICOR) 145 MG tablet   atorvastatin (LIPITOR) 40 MG tablet   Other Visit Diagnoses       Type 2 diabetes mellitus with hyperglycemia, without long-term current use of insulin (HCC)       Relevant Medications   dapagliflozin propanediol (FARXIGA) 10 MG TABS tablet   lisinopril (ZESTRIL) 5 MG tablet   atorvastatin (LIPITOR) 40 MG tablet     Allergic rhinitis, unspecified seasonality, unspecified trigger       Relevant Medications   fluticasone (FLONASE) 50 MCG/ACT nasal spray     Acute cough       Relevant Medications    promethazine-dextromethorphan (PROMETHAZINE-DM) 6.25-15 MG/5ML syrup      Return in about 6 months (around 11/22/2023) for HTN,  DM, hyperlipidemia (fasting).     Alysia Penna, NP

## 2023-05-22 NOTE — Assessment & Plan Note (Signed)
 BP at goal with lisinopril and hctz BP Readings from Last 3 Encounters:  05/22/23 138/72  04/30/23 130/72  01/24/23 130/70   Stable renal function Maintain med doses

## 2023-05-22 NOTE — Assessment & Plan Note (Addendum)
 Advised to schedule annual DIABETES eye exam. Under the care of endocrinology Current use of farxiga, glimepiride, and mounjaro Current use of ACE-I and statin LDL at goal Neuropathy controlled with gabapentin Normal UACr Unable to tolerate metformin-severs diarrhea

## 2023-05-22 NOTE — Assessment & Plan Note (Signed)
 Repeat lipid panel: LDL at goal with elevated triglyceride. Maintain atorvastatin and fenofibrate dose Repeat lipid panel in 6months

## 2023-05-23 LAB — H. PYLORI BREATH TEST: H. pylori Breath Test: DETECTED — AB

## 2023-05-24 ENCOUNTER — Encounter: Payer: Self-pay | Admitting: Nurse Practitioner

## 2023-05-24 DIAGNOSIS — R051 Acute cough: Secondary | ICD-10-CM

## 2023-05-24 DIAGNOSIS — A048 Other specified bacterial intestinal infections: Secondary | ICD-10-CM

## 2023-05-24 MED ORDER — PANTOPRAZOLE SODIUM 40 MG PO TBEC: 40.0000 mg | DELAYED_RELEASE_TABLET | Freq: Every day | ORAL | 0 refills | Status: DC

## 2023-05-24 MED ORDER — BISMUTH/METRONIDAZ/TETRACYCLIN 140-125-125 MG PO CAPS
3.0000 | ORAL_CAPSULE | Freq: Four times a day (QID) | ORAL | 0 refills | Status: DC
Start: 2023-05-24 — End: 2023-09-28

## 2023-05-24 NOTE — Addendum Note (Signed)
 Addended by: Michaela Corner on: 05/24/2023 07:58 AM   Modules accepted: Orders

## 2023-06-12 NOTE — Telephone Encounter (Signed)
 Called patient and has not completed. She asked how to you want to handle the test? She is afraid that her body may lock up on her and concerned that she she may not be able to go if that happens.

## 2023-06-15 MED ORDER — PROMETHAZINE-DM 6.25-15 MG/5ML PO SYRP: 5.0000 mL | ORAL_SOLUTION | Freq: Every day | ORAL | 0 refills | Status: DC | PRN

## 2023-06-15 NOTE — Addendum Note (Signed)
 Addended by: Alysia Penna L on: 06/15/2023 11:14 AM   Modules accepted: Orders

## 2023-06-26 ENCOUNTER — Other Ambulatory Visit: Payer: Self-pay | Admitting: Nurse Practitioner

## 2023-06-26 DIAGNOSIS — E1151 Type 2 diabetes mellitus with diabetic peripheral angiopathy without gangrene: Secondary | ICD-10-CM

## 2023-07-09 DIAGNOSIS — A048 Other specified bacterial intestinal infections: Secondary | ICD-10-CM | POA: Diagnosis not present

## 2023-07-10 LAB — HELICOBACTER PYLORI  SPECIAL ANTIGEN
MICRO NUMBER:: 16383062
SPECIMEN QUALITY: ADEQUATE

## 2023-07-17 ENCOUNTER — Telehealth: Payer: Self-pay | Admitting: Nurse Practitioner

## 2023-07-17 DIAGNOSIS — Z0279 Encounter for issue of other medical certificate: Secondary | ICD-10-CM

## 2023-07-17 NOTE — Telephone Encounter (Signed)
 Patient dropped off document Handicap Placard, to be filled out by provider. Patient requested to send it back via Call Patient to pick up within 7-days. Document is located in providers tray at front office.Please advise at [facility] [phone]   Pt husband brought form in to be filled out.  I put in the dr box

## 2023-07-17 NOTE — Telephone Encounter (Signed)
CLINICAL USE BELOW THIS LINE (use X to signify taken)  __X__Form received and placed in providers office for signature. ____Form completed and faxed to LOA Dept. ____Form completed & LVM to notify pt ready for pick up ____Charge sheet & copy of form in front office folder for office supervisor.   

## 2023-07-18 NOTE — Telephone Encounter (Signed)
 CLINICAL USE BELOW THIS LINE (use X to signify taken)  ____ Form received and placed in providers office for signature. ____ Form completed and faxed to LOA Dept. __X__ Form completed & Called to notify patient paperwork is ready for pick up __X_ Charge sheet & copy of form in front office folder for office supervisor.

## 2023-07-30 ENCOUNTER — Ambulatory Visit: Payer: Medicare Other | Admitting: Endocrinology

## 2023-08-02 ENCOUNTER — Ambulatory Visit: Payer: Self-pay | Admitting: Endocrinology

## 2023-08-02 ENCOUNTER — Ambulatory Visit: Admitting: Endocrinology

## 2023-08-02 ENCOUNTER — Encounter: Payer: Self-pay | Admitting: Endocrinology

## 2023-08-02 VITALS — BP 122/80 | HR 61 | Resp 20 | Ht 68.5 in | Wt 170.2 lb

## 2023-08-02 DIAGNOSIS — E118 Type 2 diabetes mellitus with unspecified complications: Secondary | ICD-10-CM

## 2023-08-02 DIAGNOSIS — Z7985 Long-term (current) use of injectable non-insulin antidiabetic drugs: Secondary | ICD-10-CM | POA: Diagnosis not present

## 2023-08-02 DIAGNOSIS — E1165 Type 2 diabetes mellitus with hyperglycemia: Secondary | ICD-10-CM | POA: Diagnosis not present

## 2023-08-02 DIAGNOSIS — Z7984 Long term (current) use of oral hypoglycemic drugs: Secondary | ICD-10-CM | POA: Diagnosis not present

## 2023-08-02 LAB — POCT GLYCOSYLATED HEMOGLOBIN (HGB A1C): Hemoglobin A1C: 6.6 % — AB (ref 4.0–5.6)

## 2023-08-02 MED ORDER — GLIMEPIRIDE 2 MG PO TABS: 2.0000 mg | ORAL_TABLET | Freq: Every day | ORAL | 3 refills | Status: DC

## 2023-08-02 MED ORDER — TIRZEPATIDE 5 MG/0.5ML ~~LOC~~ SOAJ: 5.0000 mg | SUBCUTANEOUS | 4 refills | Status: DC

## 2023-08-02 NOTE — Progress Notes (Signed)
 Outpatient Endocrinology Note [place] Quetzali Heinle, MD  08/02/23  Patient's Name: Sandra Ray    DOB: [birth year]    MRN: [id number]                                                    REASON OF VISIT: Follow up of type 2 diabetes mellitus  PCP: Ethelyn Herbert Connye Delaine, NP  HISTORY OF PRESENT ILLNESS:   Sandra Ray is a 78 y.o. old female with past medical history listed below, is here for follow up for type 2 diabetes mellitus.    Pertinent Diabetes History: Patient was diagnosed with type 2 diabetes mellitus in 1996.  Chronic Diabetes Complications : Retinopathy: no. Last ophthalmology exam was done on annually, Due, following with ophthalmology regularly.  Nephropathy: CKD III, on ACE/ARB / lisinopril .  Peripheral neuropathy: yes, on gabapentin  300 mg 3 times a day from PCP.  Had back surgery in September 2023. Coronary artery disease: no Stroke: no  Relevant comorbidities and cardiovascular risk factors: Obesity: no Body mass index is 25.5 kg/m.  Hypertension: Yes  Hyperlipidemia : Yes, on statin   Current / Home Diabetic regimen includes: Amaryl  4 mg daily in the morning. Farxiga  10 mg daily. Mounjaro  5 mg weekly.   Prior diabetic medications: She had taken glipizide  and Actos in the past.  Actos was stopped due to significant weight gain.  She had taken Januvia , Tradjenta  and metformin  in the past.  Nausea with high dose of metformin .  Metformin  is stopped due to diarrhea.  Glycemic data:   One Touch Verio reflect glucometer.  Reviewed from May 8 - May 22 , 2025 average blood sugar 108.  She has been checking blood sugar mostly in the morning fasting some of the blood sugar  98, 97, 110, 101, 114, 112, 106.  No hypoglycemia.  No hyperglycemia.  Hypoglycemia: Patient has no hypoglycemic episodes. Patient has hypoglycemia awareness.  Factors modifying glucose control: 1.  Diabetic diet assessment: 3 meals a day.  Eating more of the bedside table.  2.  Staying active or  exercising: No formal exercise.  3.  Medication compliance: compliant all of the time.  Interval history  Patient reports she has been eating more of the vegetables these days.  Hemoglobin A1c 6.6% today, improved.  She denies hypoglycemic symptoms.  Diabetes regimen is reviewed and noted above.  She is still getting occasional nausea and taking Zofran  as needed.  She has been taking Mounjaro  5 mg weekly.  She has improvement on diarrhea after stopping metformin  in the last visit.  No other complaints today.  REVIEW OF SYSTEMS As per history of present illness.   PAST MEDICAL HISTORY: Past Medical History:  Diagnosis Date   Abdominal wall abscess, right upper quadrant 04/07/2013   Arthritis    Benign neoplasm of skin 03/28/2010   Qualifier: Diagnosis of   By: Maebelle Schmid      IMO SNOMED Dx Update Oct 2024     Breast abscess 2000   right breast   Carpal tunnel syndrome    Complication of anesthesia    Degenerative disc disease    Diabetes mellitus type II    Eczema of hand    Hx of colonic polyps 12/02/2014   2004 - 2 diminutive polyps    Hyperlipidemia    Hypertension  HYPOKALEMIA 06/10/2008   Qualifier: Diagnosis of   By: Paulla Bossier MD, Alessandra Ancona nail 12/08/2013   Metatarsal deformity    Metatarsalgia of right foot 12/08/2013   Muscle spasm 09/18/2011   Ovarian cyst 07/2017   left    Pain in lower limb 12/08/2013   PONV (postoperative nausea and vomiting)    Shingles 2000   Tubular adenoma of colon     PAST SURGICAL HISTORY: Past Surgical History:  Procedure Laterality Date   ABDOMINAL HYSTERECTOMY  1981   menorrhagia   ABSCESS DRAINAGE Right    breast   APPENDECTOMY  1966   done with CSxn. Prophylactic   BREAST BIOPSY Left    carpel tunnel Right    CESAREAN SECTION     x2   COLONOSCOPY     LUMBAR LAMINECTOMY     9528,4132. 2011 L4,L5,S1   Right rotator cuff repair 09/2015     ROBOTIC ASSISTED SALPINGO OOPHERECTOMY Left 04/16/2018   Procedure:  XI ROBOTIC ASSISTED  LEFT SALPINGO OOPHORECTOMY;  Surgeon: Lyn Sanders, MD;  Location: [facility];  Service: Gynecology;  Laterality: Left;   SPINAL CORD STIMULATOR INSERTION N/A 12/08/2021   Procedure: PLACEMENT OF SPINAL CORD STIMULATOR;  Surgeon: Mort Ards, MD;  Location: [facility];  Service: Orthopedics;  Laterality: N/A;  2.5 hrs 3 C-Bed   TUBAL LIGATION      ALLERGIES: Allergies  Allergen Reactions   Penicillins     Causes yeast infections Did it involve swelling of the face/tongue/throat, SOB, or low BP? No Did it involve sudden or severe rash/hives, skin peeling, or any reaction on the inside of your mouth or nose? No Did you need to seek medical attention at a hospital or doctor's office? No When did it last happen?  2018     If all above answers are "NO", may proceed with cephalosporin use.  Tolerated cefazolin  in 2020   Amoxicillin  Other (See Comments)    Pt states it causes her a yeast infection  Causes yeast infections Did it involve swelling of the face/tongue/throat, SOB, or low BP? No Did it involve sudden or severe rash/hives, skin peeling, or any reaction on the inside of your mouth or nose? No Did you need to seek medical attention at a hospital or doctor's office? No When did it last happen?  2018     If all above answers are "NO", may proceed with cephalosporin use.  Tolerated cefazolin  in 2020   Erythromycin     Passed out while taking    FAMILY HISTORY:  Family History  Problem Relation Age of Onset   Hypertension Father    Diabetes Mother    Stroke Mother    Hypertension Mother    High Cholesterol Mother    Heart disease Mother    Diabetes Brother    Breast cancer Daughter 25       unsure if did genetic testing   Breast cancer Maternal Aunt    Breast cancer Sister 70   Colon polyps Brother     SOCIAL HISTORY: Social History   Socioeconomic History   Marital status: Married    Spouse name: Not on file   Number of children: 2   Years of  education: Not on file   Highest education level: Not on file  Occupational History   Not on file  Tobacco Use   Smoking status: Never   Smokeless tobacco: Never  Vaping Use   Vaping  status: Never Used  Substance and Sexual Activity   Alcohol use: No   Drug use: No   Sexual activity: Not Currently  Other Topics Concern   Not on file  Social History Narrative   Married 2 daughters   Not working outside home   no caffeine   12/02/2014   Social Drivers of Health   Financial Resource Strain: Low Risk  (10/13/2022)   Overall Financial Resource Strain (CARDIA)    Difficulty of Paying Living Expenses: Not hard at all  Food Insecurity: No Food Insecurity (10/13/2022)   Hunger Vital Sign    Worried About Running Out of Food in the Last Year: Never true    Ran Out of Food in the Last Year: Never true  Transportation Needs: No Transportation Needs (10/13/2022)   PRAPARE - Administrator, Civil Service (Medical): No    Lack of Transportation (Non-Medical): No  Physical Activity: Inactive (10/13/2022)   Exercise Vital Sign    Days of Exercise per Week: 0 days    Minutes of Exercise per Session: 0 min  Stress: No Stress Concern Present (10/13/2022)   [employer] of Occupational Health - Occupational Stress Questionnaire    Feeling of Stress : Not at all  Social Connections: Moderately Integrated (10/13/2022)   Social Connection and Isolation Panel [NHANES]    Frequency of Communication with Friends and Family: Twice a week    Frequency of Social Gatherings with Friends and Family: Once a week    Attends Religious Services: More than 4 times per year    Active Member of [employer] or Organizations: No    Attends Banker Meetings: Never    Marital Status: Married    MEDICATIONS:  Current Outpatient Medications  Medication Sig Dispense Refill   acetaminophen  (TYLENOL ) 650 MG CR tablet Take 1,300 mg by mouth 2 (two) times daily.     atorvastatin  (LIPITOR) 40 MG tablet  Take 1 tablet (40 mg total) by mouth at bedtime. 90 tablet 3   Bismuth /Metronidaz/Tetracyclin (PYLERA) 140-125-125 MG CAPS Take 3 capsules by mouth every 6 (six) hours. 120 capsule 0   calcium  carbonate (TUMS - DOSED IN MG ELEMENTAL CALCIUM ) 500 MG chewable tablet Chew 1-2 tablets by mouth daily as needed for indigestion or heartburn.     cyanocobalamin (VITAMIN B12) 1000 MCG tablet Take 1,000 mcg by mouth daily.     dapagliflozin  propanediol (FARXIGA ) 10 MG TABS tablet Take 1 tablet (10 mg total) by mouth daily before breakfast.     diphenhydramine-acetaminophen  (TYLENOL  PM) 25-500 MG TABS tablet Take 3 tablets by mouth at bedtime.     famotidine  (PEPCID ) 20 MG tablet Take 1 tablet (20 mg total) by mouth 2 (two) times daily as needed for heartburn or indigestion. 14 tablet 0   fenofibrate  (TRICOR ) 145 MG tablet Take 1 tablet (145 mg total) by mouth daily. Needs to maintain appointment with pcp on 05/22/2023 for additional refills 90 tablet 0   fluticasone  (FLONASE ) 50 MCG/ACT nasal spray Place 2 sprays into both nostrils daily. 16 g 1   gabapentin  (NEURONTIN ) 300 MG capsule TAKE 1 CAPSULE BY MOUTH 3 TIMES  DAILY 270 capsule 1   glimepiride  (AMARYL ) 2 MG tablet Take 1 tablet (2 mg total) by mouth daily before breakfast. 100 tablet 3   hydrochlorothiazide  (HYDRODIURIL ) 25 MG tablet Take 1 tablet (25 mg total) by mouth daily. Needs to maintain appointment with pcp on 05/22/2023 for additional refills 90 tablet 0   Lancets (ONETOUCH  ULTRASOFT) lancets Use as instructed once day.  E11.40 100 each 5   lisinopril  (ZESTRIL ) 5 MG tablet Take 1 tablet (5 mg total) by mouth daily. Needs to maintain appointment with pcp on 05/22/2023 for additional refills 90 tablet 0   ondansetron  (ZOFRAN ) 4 MG tablet Take 1 tablet (4 mg total) by mouth every 8 (eight) hours as needed for nausea or vomiting. 30 tablet 0   ONETOUCH VERIO test strip USE AS INSTRUCTED TO CHECK BLOOD SUGAR TWICE DAILY 200 strip 2   pantoprazole   (PROTONIX ) 40 MG tablet Take 1 tablet (40 mg total) by mouth daily. 10 tablet 0   promethazine -dextromethorphan  (PROMETHAZINE -DM) 6.25-15 MG/5ML syrup Take 5 mLs by mouth daily as needed for cough. 118 mL 0   tirzepatide  (MOUNJARO ) 5 MG/0.5ML Pen Inject 5 mg into the skin once a week. After 4 weeks of 2.5mg , 6 mL 4   No current facility-administered medications for this visit.   Facility-Administered Medications Ordered in Other Visits  Medication Dose Route Frequency Provider Last Rate Last Admin   vancomycin  (VANCOREADY) IVPB 1500 mg/300 mL  1,500 mg Intravenous Once Brooks, Dahari, MD        PHYSICAL EXAM: Vitals:   08/02/23 1324  BP: 122/80  Pulse: 61  Resp: 20  SpO2: 98%  Weight: 170 lb 3.2 oz (77.2 kg)  Height: 5' 8.5" (1.74 m)    Body mass index is 25.5 kg/m.  Wt Readings from Last 3 Encounters:  08/02/23 170 lb 3.2 oz (77.2 kg)  05/22/23 166 lb 9.6 oz (75.6 kg)  04/30/23 160 lb (72.6 kg)    General: Well developed, well nourished female in no apparent distress.  HEENT: AT/[place], no external lesions.  Eyes: Conjunctiva clear and no icterus. Neck: Neck supple  Lungs: Respirations not labored Neurologic: Alert, oriented, normal speech Extremities / Skin: Dry.  Psychiatric: Does not appear depressed or anxious  Diabetic Foot Exam - Simple   No data filed    LABS Reviewed Lab Results  Component Value Date   HGBA1C 6.6 (A) 08/02/2023   HGBA1C 7.1 (H) 04/23/2023   HGBA1C 6.4 01/19/2023   Lab Results  Component Value Date   FRUCTOSAMINE 237 03/28/2019   FRUCTOSAMINE 263 05/14/2018   FRUCTOSAMINE 328 (H) 07/07/2016   Lab Results  Component Value Date   CHOL 141 04/23/2023   HDL 44 (L) 04/23/2023   LDLCALC 70 04/23/2023   LDLDIRECT 89.0 11/30/2021   TRIG 194 (H) 04/23/2023   CHOLHDL 3.2 04/23/2023   Lab Results  Component Value Date   MICRALBCREAT 8 04/23/2023   MICRALBCREAT 1.1 01/19/2023   Lab Results  Component Value Date   CREATININE 0.78  04/23/2023   Lab Results  Component Value Date   GFR 52.46 (L) 01/19/2023    ASSESSMENT / PLAN  1. Controlled type 2 diabetes mellitus with complication, without long-term current use of insulin  (HCC)   2. Type 2 diabetes mellitus with hyperglycemia, without long-term current use of insulin  (HCC)     Diabetes Mellitus type 2, complicated by CKD /neuropathy. - Diabetic status / severity: Controlled.  Lab Results  Component Value Date   HGBA1C 6.6 (A) 08/02/2023    - Hemoglobin A1c goal : <7%  - Medications: see below.  Continue Farxiga  10 mg daily. Decrease Amaryl  from 4 to 2 mg daily in the morning. Continue Mounjaro  5 mg weekly for now.  She has occasional nausea.  Will gradually increase Mounjaro  as tolerated in the future visit.  - Home glucose  testing: In the morning fasting daily and at bedtime few times a week.  - Discussed/ Gave Hypoglycemia treatment plan.  # Consult : not required at this time.   # Annual urine for microalbuminuria/ creatinine ratio, no microalbuminuria currently, continue ACE/ARB /lisinopril . Last  Lab Results  Component Value Date   MICRALBCREAT 8 04/23/2023    # Foot check nightly / neuropathy, continue gabapentin , managed by PCP.  # Annual dilated diabetic eye exams.   - Diet: Make healthy diabetic food choices - Life style / activity / exercise: Discussed.  2. Blood pressure  -  BP Readings from Last 1 Encounters:  08/02/23 122/80    - Control is in target.  - No change in current plans.  3. Lipid status / Hyperlipidemia - Last  Lab Results  Component Value Date   LDLCALC 70 04/23/2023   - Continue atorvastatin  40 mg daily.  Managed by primary care provider.  Diagnoses and all orders for this visit:  Controlled type 2 diabetes mellitus with complication, without long-term current use of insulin  (HCC) -     POCT glycosylated hemoglobin (Hb A1C) -     glimepiride  (AMARYL ) 2 MG tablet; Take 1 tablet (2 mg total) by mouth  daily before breakfast.  Type 2 diabetes mellitus with hyperglycemia, without long-term current use of insulin  (HCC) -     tirzepatide  (MOUNJARO ) 5 MG/0.5ML Pen; Inject 5 mg into the skin once a week. After 4 weeks of 2.5mg ,     DISPOSITION Follow up in clinic in 3 months suggested.   All questions answered and patient verbalized understanding of the plan.  [place] Ray Gervasi, MD [facility] [address] [place], [address] [place], [place] [address] Phone # [phone]  At least part of this note was generated using voice recognition software. Inadvertent word errors may have occurred, which were not recognized during the proofreading process.

## 2023-08-02 NOTE — Patient Instructions (Addendum)
 Decrease Amaryl  2 mg daily in the morning. Farxiga  10 mg daily. Mounjaro  5 mg weekly.

## 2023-08-03 ENCOUNTER — Other Ambulatory Visit: Payer: Self-pay

## 2023-08-03 DIAGNOSIS — E1165 Type 2 diabetes mellitus with hyperglycemia: Secondary | ICD-10-CM

## 2023-08-03 MED ORDER — TIRZEPATIDE 5 MG/0.5ML ~~LOC~~ SOAJ: 5.0000 mg | SUBCUTANEOUS | 4 refills | Status: DC

## 2023-08-13 DIAGNOSIS — E119 Type 2 diabetes mellitus without complications: Secondary | ICD-10-CM | POA: Diagnosis not present

## 2023-08-13 LAB — HM DIABETES EYE EXAM

## 2023-08-27 ENCOUNTER — Other Ambulatory Visit: Payer: Self-pay

## 2023-08-27 DIAGNOSIS — E1165 Type 2 diabetes mellitus with hyperglycemia: Secondary | ICD-10-CM

## 2023-08-27 MED ORDER — FARXIGA 10 MG PO TABS: 10.0000 mg | ORAL_TABLET | Freq: Every day | ORAL | 30 refills | Status: AC

## 2023-08-31 ENCOUNTER — Telehealth: Payer: Self-pay

## 2023-08-31 NOTE — Telephone Encounter (Signed)
 Spoke with patient pharmacy [facility] confirming patient shou\le be taking Farxiga  10mg  instead of Xigduo . Patient called to clarify that although they sent her a shipment, Farxiga  should be the only one she is taking. Pharmacy has now discontinued Xigduo . No further questions from patient .

## 2023-09-04 ENCOUNTER — Other Ambulatory Visit: Payer: Self-pay | Admitting: Nurse Practitioner

## 2023-09-04 DIAGNOSIS — E785 Hyperlipidemia, unspecified: Secondary | ICD-10-CM

## 2023-09-07 ENCOUNTER — Other Ambulatory Visit: Payer: Self-pay

## 2023-09-07 ENCOUNTER — Other Ambulatory Visit: Payer: Self-pay | Admitting: Nurse Practitioner

## 2023-09-07 DIAGNOSIS — E1151 Type 2 diabetes mellitus with diabetic peripheral angiopathy without gangrene: Secondary | ICD-10-CM

## 2023-09-17 ENCOUNTER — Telehealth: Payer: Self-pay | Admitting: Nurse Practitioner

## 2023-09-17 NOTE — Telephone Encounter (Signed)
 I called in regards to husband's recent hospital admission. Sister stated she was in the hospital with husband and does not have a cell phone. She provided the nephew's number-Chris. She stated he was in the hospital with Sandra Ray. Left message with her sister to inform Sandra Ray to call the office when possible

## 2023-09-19 ENCOUNTER — Other Ambulatory Visit: Payer: Self-pay | Admitting: Nurse Practitioner

## 2023-09-19 DIAGNOSIS — E785 Hyperlipidemia, unspecified: Secondary | ICD-10-CM

## 2023-09-19 DIAGNOSIS — I10 Essential (primary) hypertension: Secondary | ICD-10-CM

## 2023-09-21 ENCOUNTER — Telehealth: Payer: Self-pay | Admitting: Nurse Practitioner

## 2023-09-21 NOTE — Telephone Encounter (Signed)
 Called Sandra Ray to give my condolences on recent demise of Sandra Ray. She said thank you

## 2023-09-28 ENCOUNTER — Ambulatory Visit: Admitting: Nurse Practitioner

## 2023-09-28 VITALS — BP 142/76 | HR 84 | Temp 98.5°F | Ht 68.0 in | Wt 164.2 lb

## 2023-09-28 DIAGNOSIS — E1169 Type 2 diabetes mellitus with other specified complication: Secondary | ICD-10-CM | POA: Diagnosis not present

## 2023-09-28 DIAGNOSIS — F4321 Adjustment disorder with depressed mood: Secondary | ICD-10-CM

## 2023-09-28 DIAGNOSIS — Z7985 Long-term (current) use of injectable non-insulin antidiabetic drugs: Secondary | ICD-10-CM | POA: Diagnosis not present

## 2023-09-28 DIAGNOSIS — R413 Other amnesia: Secondary | ICD-10-CM

## 2023-09-28 MED ORDER — ONDANSETRON HCL 4 MG PO TABS
4.0000 mg | ORAL_TABLET | Freq: Three times a day (TID) | ORAL | 5 refills | Status: AC | PRN
Start: 2023-09-28 — End: ?

## 2023-09-28 NOTE — Progress Notes (Signed)
 Established Patient Visit  Patient: Sandra Ray   DOB: 04-09-1945   78 y.o. Female  MRN: [id number] Visit Date: 10/01/2023  Subjective:    Chief Complaint  Patient presents with   Memory Issues    HPI  Memory deficit Accompanied by daughter and grand-daughter. They are both concerned about Sandra Ray's declined in memory since sudden death of Mr. Ray 1week ago. They state she had some short term memory difficulty in past, but has been exacerbation in the last 1week. Sandra Ray has period of pacing constantly around 5pm daily, repeats herself constantly, burns food while cooking, and forgets recipes of meals she previously cooked often. The family plans to be at home with her at all times. Sandra Ray does acknowledge that she has some memory lapses and difficulty finding her words often. She take tylenol  PM 2-3tabs at hs since death of her husband. Hx of alzeihmer dementia-mother, aunts and grandmother MMSE 20/30  Memory difficulty, restlessness, and sleep disturbance exacerbated by grief Ordered MRI brain and referral to neurology  Diabetes mellitus type II, controlled (HCC) Nausea due to use of GLP-1RA Need for zofran  refill  Fhx of alzheimer dementia-mother     09/28/2023    2:05 PM 03/28/2016    1:27 PM  MMSE - Mini Mental State Exam  Orientation to time 4 5   Orientation to Place 5 5   Registration 2 3   Attention/ Calculation 0 5   Recall 0 3   Language- name 2 objects 2 2   Language- repeat 1 1  Language- follow 3 step command 3 3   Language- read & follow direction 1 1   Write a sentence 1 1   Copy design 1 1   Total score 20 30      Data saved with a previous flowsheet row definition    Wt Readings from Last 3 Encounters:  09/28/23 164 lb 3.2 oz (74.5 kg)  08/02/23 170 lb 3.2 oz (77.2 kg)  05/22/23 166 lb 9.6 oz (75.6 kg)    BP Readings from Last 3 Encounters:  09/28/23 (!) 142/76  08/02/23 122/80  05/22/23 138/72    Reviewed medical,  surgical, and social history today  Medications: Outpatient Medications Prior to Visit  Medication Sig   acetaminophen  (TYLENOL ) 650 MG CR tablet Take 1,300 mg by mouth 2 (two) times daily. (Patient taking differently: Take 1,300 mg by mouth every 8 (eight) hours as needed for pain.)   calcium  carbonate (TUMS - DOSED IN MG ELEMENTAL CALCIUM ) 500 MG chewable tablet Chew 1-2 tablets by mouth daily as needed for indigestion or heartburn.   cyanocobalamin (VITAMIN B12) 1000 MCG tablet Take 1,000 mcg by mouth daily.   diphenhydramine-acetaminophen  (TYLENOL  PM) 25-500 MG TABS tablet Take 3 tablets by mouth at bedtime.   FARXIGA  10 MG TABS tablet Take 1 tablet (10 mg total) by mouth daily before breakfast.   fenofibrate  (TRICOR ) 145 MG tablet TAKE 1 TABLET BY MOUTH DAILY   fluticasone  (FLONASE ) 50 MCG/ACT nasal spray Place 2 sprays into both nostrils daily.   gabapentin  (NEURONTIN ) 300 MG capsule TAKE 1 CAPSULE BY MOUTH 3 TIMES  DAILY   glimepiride  (AMARYL ) 2 MG tablet Take 1 tablet (2 mg total) by mouth daily before breakfast.   hydrochlorothiazide  (HYDRODIURIL ) 25 MG tablet TAKE 1 TABLET BY MOUTH DAILY   Lancets (ONETOUCH ULTRASOFT) lancets Use as instructed once day.  E11.40  lisinopril  (ZESTRIL ) 5 MG tablet TAKE 1 TABLET (5 MG TOTAL) BY  MOUTH DAILY. NEEDS TO MAINTAIN  APPOINTMENT WITH PCP ON  05/22/2023 FOR ADDITIONAL REFILLS   ONETOUCH VERIO test strip USE AS INSTRUCTED TO CHECK BLOOD SUGAR TWICE DAILY   tirzepatide  (MOUNJARO ) 5 MG/0.5ML Pen Inject 5 mg into the skin once a week. After 4 weeks of 2.5mg ,   [DISCONTINUED] famotidine  (PEPCID ) 20 MG tablet Take 1 tablet (20 mg total) by mouth 2 (two) times daily as needed for heartburn or indigestion.   [DISCONTINUED] ondansetron  (ZOFRAN ) 4 MG tablet Take 1 tablet (4 mg total) by mouth every 8 (eight) hours as needed for nausea or vomiting.   [DISCONTINUED] pantoprazole  (PROTONIX ) 40 MG tablet Take 1 tablet (40 mg total) by mouth daily.    [DISCONTINUED] promethazine -dextromethorphan  (PROMETHAZINE -DM) 6.25-15 MG/5ML syrup Take 5 mLs by mouth daily as needed for cough.   atorvastatin  (LIPITOR) 40 MG tablet Take 1 tablet (40 mg total) by mouth at bedtime.   [DISCONTINUED] Bismuth /Metronidaz/Tetracyclin (PYLERA) 140-125-125 MG CAPS Take 3 capsules by mouth every 6 (six) hours.   [DISCONTINUED] vancomycin  (VANCOREADY) IVPB 1500 mg/300 mL    No facility-administered medications prior to visit.   Reviewed past medical and social history.   ROS per HPI above  Last CBC Lab Results  Component Value Date   WBC 19.3 (H) 02/07/2022   HGB 14.5 02/07/2022   HCT 43.5 02/07/2022   MCV 92.9 02/07/2022   MCH 31.0 02/07/2022   RDW 14.0 02/07/2022   PLT 324 02/07/2022   Last metabolic panel Lab Results  Component Value Date   GLUCOSE 192 (H) 04/23/2023   NA 137 04/23/2023   K 4.6 04/23/2023   CL 96 (L) 04/23/2023   CO2 33 (H) 04/23/2023   BUN 26 (H) 04/23/2023   CREATININE 0.78 04/23/2023   GFR 52.46 (L) 01/19/2023   CALCIUM  10.0 04/23/2023   PROT 7.7 02/07/2022   ALBUMIN 4.4 02/07/2022   BILITOT 0.7 02/07/2022   ALKPHOS 54 02/07/2022   AST 27 02/07/2022   ALT 15 02/07/2022   ANIONGAP 14 02/07/2022   Last hemoglobin A1c Lab Results  Component Value Date   HGBA1C 6.6 (A) 08/02/2023   Last thyroid  functions Lab Results  Component Value Date   TSH 1.69 02/02/2020        Objective:  BP (!) 142/76 (BP Location: Left Arm, Patient Position: Sitting, Cuff Size: Normal)   Pulse 84   Temp 98.5 F (36.9 C) (Oral)   Ht 5' 8 (1.727 m)   Wt 164 lb 3.2 oz (74.5 kg)   SpO2 98%   BMI 24.97 kg/m      Physical Exam Neurological:     Mental Status: She is oriented to person, place, and time.     Cranial Nerves: No cranial nerve deficit.     Motor: Weakness present.     Gait: Gait abnormal.  Psychiatric:        Attention and Perception: Attention normal.        Mood and Affect: Mood is depressed. Affect is tearful.         Speech: Speech normal.        Behavior: Behavior is cooperative.        Thought Content: Thought content normal.        Cognition and Memory: Memory is impaired. She does not exhibit impaired remote memory.     No results found for any visits on 09/28/23.    Assessment & Plan:  Problem List Items Addressed This Visit     Diabetes mellitus type II, controlled (HCC)   Nausea due to use of GLP-1RA Need for zofran  refill      Relevant Medications   ondansetron  (ZOFRAN ) 4 MG tablet   Memory deficit - Primary   Accompanied by daughter and grand-daughter. They are both concerned about Sandra Ray's declined in memory since sudden death of Mr. Ray 1week ago. They state she had some short term memory difficulty in past, but has been exacerbation in the last 1week. Sandra Ray has period of pacing constantly around 5pm daily, repeats herself constantly, burns food while cooking, and forgets recipes of meals she previously cooked often. The family plans to be at home with her at all times. Sandra Ray does acknowledge that she has some memory lapses and difficulty finding her words often. She take tylenol  PM 2-3tabs at hs since death of her husband. Hx of alzeihmer dementia-mother, aunts and grandmother MMSE 20/30  Memory difficulty, restlessness, and sleep disturbance exacerbated by grief Ordered MRI brain and referral to neurology      Relevant Orders   Ambulatory referral to Neurology   MR Brain Wo Contrast   Other Visit Diagnoses       Grief          Return in about 2 weeks (around 10/12/2023) for memory deficit and grief (repeat labs).     Roselie Mood, NP

## 2023-09-28 NOTE — Patient Instructions (Signed)

## 2023-10-01 ENCOUNTER — Encounter: Payer: Self-pay | Admitting: Nurse Practitioner

## 2023-10-01 DIAGNOSIS — R413 Other amnesia: Secondary | ICD-10-CM | POA: Insufficient documentation

## 2023-10-01 NOTE — Assessment & Plan Note (Signed)
 Nausea due to use of GLP-1RA Need for zofran  refill

## 2023-10-01 NOTE — Assessment & Plan Note (Addendum)
 Accompanied by daughter and grand-daughter. They are both concerned about Mrs. Sandra Ray's declined in memory since sudden death of Sandra Ray 1week ago. They state she had some short term memory difficulty in past, but has been exacerbation in the last 1week. Sandra Ray has period of pacing constantly around 5pm daily, repeats herself constantly, burns food while cooking, and forgets recipes of meals she previously cooked often. The family plans to be at home with her at all times. Sandra Ray does acknowledge that she has some memory lapses and difficulty finding her words often. She take tylenol  PM 2-3tabs at hs since death of her husband. Hx of alzeihmer dementia-mother, aunts and grandmother MMSE 20/30  Memory difficulty, restlessness, and sleep disturbance exacerbated by grief Ordered MRI brain and referral to neurology

## 2023-10-02 ENCOUNTER — Ambulatory Visit: Admitting: Nurse Practitioner

## 2023-10-09 ENCOUNTER — Ambulatory Visit (INDEPENDENT_AMBULATORY_CARE_PROVIDER_SITE_OTHER): Admitting: Nurse Practitioner

## 2023-10-09 ENCOUNTER — Encounter: Payer: Self-pay | Admitting: Nurse Practitioner

## 2023-10-09 VITALS — BP 136/68 | HR 66 | Temp 97.7°F | Ht 68.0 in | Wt 167.2 lb

## 2023-10-09 DIAGNOSIS — R413 Other amnesia: Secondary | ICD-10-CM

## 2023-10-09 DIAGNOSIS — F4321 Adjustment disorder with depressed mood: Secondary | ICD-10-CM | POA: Diagnosis not present

## 2023-10-09 NOTE — Patient Instructions (Signed)
 Maintain current med doses

## 2023-10-09 NOTE — Progress Notes (Signed)
 Established Patient Visit  Patient: Sandra Ray   DOB: [birth year]   78 y.o. Female  MRN: [id number] Visit Date: 10/09/2023  Subjective:    Chief Complaint  Patient presents with   Follow-up    2 week follow up for memory    HPI Memory deficit Accompanied by grand daughter today. She reports improved sleep quality, improved appetite, and decrease incident of memory lapse. She is scheduled for MRI brain on 10/15/2023. She denies any acute complaints today. BP Readings from Last 3 Encounters:  10/09/23 136/68  09/28/23 (!) 142/76  08/02/23 122/80    Wt Readings from Last 3 Encounters:  10/09/23 167 lb 3.2 oz (75.8 kg)  09/28/23 164 lb 3.2 oz (74.5 kg)  08/02/23 170 lb 3.2 oz (77.2 kg)    F/up in 3months     Reviewed medical, surgical, and social history today  Medications: Outpatient Medications Prior to Visit  Medication Sig   acetaminophen  (TYLENOL ) 650 MG CR tablet Take 1,300 mg by mouth 2 (two) times daily. (Patient taking differently: Take 1,300 mg by mouth every 8 (eight) hours as needed for pain.)   atorvastatin  (LIPITOR) 40 MG tablet Take 1 tablet (40 mg total) by mouth at bedtime.   calcium  carbonate (TUMS - DOSED IN MG ELEMENTAL CALCIUM ) 500 MG chewable tablet Chew 1-2 tablets by mouth daily as needed for indigestion or heartburn.   cyanocobalamin (VITAMIN B12) 1000 MCG tablet Take 1,000 mcg by mouth daily.   diphenhydramine-acetaminophen  (TYLENOL  PM) 25-500 MG TABS tablet Take 3 tablets by mouth at bedtime.   FARXIGA  10 MG TABS tablet Take 1 tablet (10 mg total) by mouth daily before breakfast.   fenofibrate  (TRICOR ) 145 MG tablet TAKE 1 TABLET BY MOUTH DAILY   fluticasone  (FLONASE ) 50 MCG/ACT nasal spray Place 2 sprays into both nostrils daily.   gabapentin  (NEURONTIN ) 300 MG capsule TAKE 1 CAPSULE BY MOUTH 3 TIMES  DAILY   glimepiride  (AMARYL ) 2 MG tablet Take 1 tablet (2 mg total) by mouth daily before breakfast.   hydrochlorothiazide   (HYDRODIURIL ) 25 MG tablet TAKE 1 TABLET BY MOUTH DAILY   Lancets (ONETOUCH ULTRASOFT) lancets Use as instructed once day.  E11.40   lisinopril  (ZESTRIL ) 5 MG tablet TAKE 1 TABLET (5 MG TOTAL) BY  MOUTH DAILY. NEEDS TO MAINTAIN  APPOINTMENT WITH PCP ON  05/22/2023 FOR ADDITIONAL REFILLS   ondansetron  (ZOFRAN ) 4 MG tablet Take 1 tablet (4 mg total) by mouth every 8 (eight) hours as needed for nausea or vomiting.   ONETOUCH VERIO test strip USE AS INSTRUCTED TO CHECK BLOOD SUGAR TWICE DAILY   tirzepatide  (MOUNJARO ) 5 MG/0.5ML Pen Inject 5 mg into the skin once a week. After 4 weeks of 2.5mg ,   No facility-administered medications prior to visit.   Reviewed past medical and social history.   ROS per HPI above      Objective:  BP 136/68 (BP Location: Left Arm, Patient Position: Sitting, Cuff Size: Small)   Pulse 66   Temp 97.7 F (36.5 C) (Oral)   Ht 5' 8 (1.727 m)   Wt 167 lb 3.2 oz (75.8 kg)   SpO2 99%   BMI 25.42 kg/m      Physical Exam Vitals and nursing note reviewed.  Cardiovascular:     Rate and Rhythm: Normal rate.     Pulses: Normal pulses.  Pulmonary:     Effort: Pulmonary effort is normal.  Neurological:  Mental Status: She is alert and oriented to person, place, and time.  Psychiatric:        Mood and Affect: Mood normal.        Behavior: Behavior normal.        Thought Content: Thought content normal.     No results found for any visits on 10/09/23.    Assessment & Plan:    Problem List Items Addressed This Visit     Memory deficit   Accompanied by grand daughter today. She reports improved sleep quality, improved appetite, and decrease incident of memory lapse. She is scheduled for MRI brain on 10/15/2023. She denies any acute complaints today. BP Readings from Last 3 Encounters:  10/09/23 136/68  09/28/23 (!) 142/76  08/02/23 122/80    Wt Readings from Last 3 Encounters:  10/09/23 167 lb 3.2 oz (75.8 kg)  09/28/23 164 lb 3.2 oz (74.5 kg)   08/02/23 170 lb 3.2 oz (77.2 kg)    F/up in 3months         Other Visit Diagnoses       Grief    -  Primary      Return in about 3 months (around 01/09/2024) for HTN, DM, hyperlipidemia (fasting).     Roselie Mood, NP

## 2023-10-09 NOTE — Assessment & Plan Note (Signed)
 Accompanied by grand daughter today. She reports improved sleep quality, improved appetite, and decrease incident of memory lapse. She is scheduled for MRI brain on 10/15/2023. She denies any acute complaints today. BP Readings from Last 3 Encounters:  10/09/23 136/68  09/28/23 (!) 142/76  08/02/23 122/80    Wt Readings from Last 3 Encounters:  10/09/23 167 lb 3.2 oz (75.8 kg)  09/28/23 164 lb 3.2 oz (74.5 kg)  08/02/23 170 lb 3.2 oz (77.2 kg)    F/up in 3months

## 2023-10-15 ENCOUNTER — Ambulatory Visit (INDEPENDENT_AMBULATORY_CARE_PROVIDER_SITE_OTHER)

## 2023-10-15 DIAGNOSIS — Z0000 Encounter for general adult medical examination without abnormal findings: Secondary | ICD-10-CM

## 2023-10-15 NOTE — Progress Notes (Signed)
 Subjective:   Sandra Ray is a 78 y.o. who presents for a Medicare Wellness preventive visit.  As a reminder, Annual Wellness Visits don't include a physical exam, and some assessments may be limited, especially if this visit is performed virtually. We may recommend an in-person follow-up visit with your provider if needed.  Visit Complete: Virtual I connected with  Sandra Ray on 10/15/23 by a audio enabled telemedicine application and verified that I am speaking with the correct person using two identifiers.  Patient Location: Home  Provider Location: Office/Clinic  I discussed the limitations of evaluation and management by telemedicine. The patient expressed understanding and agreed to proceed.  Vital Signs: Because this visit was a virtual/telehealth visit, some criteria may be missing or patient reported. Any vitals not documented were not able to be obtained and vitals that have been documented are patient reported.  VideoError- Librarian, academic were attempted between this provider and patient, however failed, due to patient having technical difficulties OR patient did not have access to video capability.  We continued and completed visit with audio only.   Persons Participating in Visit: Patient.  AWV Questionnaire: No: Patient Medicare AWV questionnaire was not completed prior to this visit.  Cardiac Risk Factors include: advanced age (>63men, >59 women);diabetes mellitus;dyslipidemia;hypertension     Objective:    Today's Vitals   There is no height or weight on file to calculate BMI.     10/15/2023   10:15 AM 10/13/2022    1:49 PM 12/01/2021   11:12 AM 08/30/2021    1:37 PM 05/15/2021    2:19 PM 11/29/2020    3:32 PM 08/03/2020   11:20 AM  Advanced Directives  Does Patient Have a Medical Advance Directive? Yes Yes No No No Yes No  Type of Advance Directive Out of facility DNR (pink MOST or yellow form) Out of facility DNR (pink MOST or  yellow form)    Living will;Out of facility DNR (pink MOST or yellow form)   Does patient want to make changes to medical advance directive?      Yes (MAU/Ambulatory/Procedural Areas - Information given)   Would patient like information on creating a medical advance directive?   No - Patient declined No - Patient declined   Yes (MAU/Ambulatory/Procedural Areas - Information given)  Pre-existing out of facility DNR order (yellow form or pink MOST form) Pink MOST/Yellow Form most recent copy in chart - Physician notified to receive inpatient order          Current Medications (verified) Outpatient Encounter Medications as of 10/15/2023  Medication Sig   acetaminophen  (TYLENOL ) 650 MG CR tablet Take 1,300 mg by mouth 2 (two) times daily. (Patient taking differently: Take 1,300 mg by mouth every 8 (eight) hours as needed for pain.)   atorvastatin  (LIPITOR) 40 MG tablet Take 1 tablet (40 mg total) by mouth at bedtime.   calcium  carbonate (TUMS - DOSED IN MG ELEMENTAL CALCIUM ) 500 MG chewable tablet Chew 1-2 tablets by mouth daily as needed for indigestion or heartburn.   cyanocobalamin (VITAMIN B12) 1000 MCG tablet Take 1,000 mcg by mouth daily.   diphenhydramine-acetaminophen  (TYLENOL  PM) 25-500 MG TABS tablet Take 3 tablets by mouth at bedtime.   FARXIGA  10 MG TABS tablet Take 1 tablet (10 mg total) by mouth daily before breakfast.   fenofibrate  (TRICOR ) 145 MG tablet TAKE 1 TABLET BY MOUTH DAILY   fluticasone  (FLONASE ) 50 MCG/ACT nasal spray Place 2 sprays into both nostrils  daily.   gabapentin  (NEURONTIN ) 300 MG capsule TAKE 1 CAPSULE BY MOUTH 3 TIMES  DAILY   glimepiride  (AMARYL ) 2 MG tablet Take 1 tablet (2 mg total) by mouth daily before breakfast.   hydrochlorothiazide  (HYDRODIURIL ) 25 MG tablet TAKE 1 TABLET BY MOUTH DAILY   Lancets (ONETOUCH ULTRASOFT) lancets Use as instructed once day.  E11.40   lisinopril  (ZESTRIL ) 5 MG tablet TAKE 1 TABLET (5 MG TOTAL) BY  MOUTH DAILY. NEEDS TO MAINTAIN   APPOINTMENT WITH PCP ON  05/22/2023 FOR ADDITIONAL REFILLS   ondansetron  (ZOFRAN ) 4 MG tablet Take 1 tablet (4 mg total) by mouth every 8 (eight) hours as needed for nausea or vomiting.   ONETOUCH VERIO test strip USE AS INSTRUCTED TO CHECK BLOOD SUGAR TWICE DAILY   tirzepatide  (MOUNJARO ) 5 MG/0.5ML Pen Inject 5 mg into the skin once a week. After 4 weeks of 2.5mg ,   No facility-administered encounter medications on file as of 10/15/2023.    Allergies (verified) Penicillins, Amoxicillin , and Erythromycin   History: Past Medical History:  Diagnosis Date   Abdominal wall abscess, right upper quadrant 04/07/2013   Arthritis    Benign neoplasm of skin 03/28/2010   Qualifier: Diagnosis of   By: Josetta Corrigan      IMO SNOMED Dx Update Oct 2024     Breast abscess 2000   right breast   Carpal tunnel syndrome    Complication of anesthesia    Degenerative disc disease    Diabetes mellitus type II    Eczema of hand    Hx of colonic polyps 12/02/2014   2004 - 2 diminutive polyps    Hyperlipidemia    Hypertension    HYPOKALEMIA 06/10/2008   Qualifier: Diagnosis of   By: Mahlon MD, [place] Donna nail 12/08/2013   Metatarsal deformity    Metatarsalgia of right foot 12/08/2013   Muscle spasm 09/18/2011   Ovarian cyst 07/2017   left    Pain in lower limb 12/08/2013   PONV (postoperative nausea and vomiting)    Shingles 2000   Tubular adenoma of colon    Past Surgical History:  Procedure Laterality Date   ABDOMINAL HYSTERECTOMY  1981   menorrhagia   ABSCESS DRAINAGE Right    breast   APPENDECTOMY  1966   done with CSxn. Prophylactic   BREAST BIOPSY Left    carpel tunnel Right    CESAREAN SECTION     x2   COLONOSCOPY     LUMBAR LAMINECTOMY     7990,7989. 2011 L4,L5,S1   Right rotator cuff repair 09/2015     ROBOTIC ASSISTED SALPINGO OOPHERECTOMY Left 04/16/2018   Procedure: XI ROBOTIC ASSISTED  LEFT SALPINGO OOPHORECTOMY;  Surgeon: Anitra Freddy NOVAK, MD;  Location: [facility]  ORS;  Service: Gynecology;  Laterality: Left;   SPINAL CORD STIMULATOR INSERTION N/A 12/08/2021   Procedure: PLACEMENT OF SPINAL CORD STIMULATOR;  Surgeon: Burnetta Aures, MD;  Location: [facility] OR;  Service: Orthopedics;  Laterality: N/A;  2.5 hrs 3 C-Bed   TUBAL LIGATION     Family History  Problem Relation Age of Onset   Hypertension Father    Diabetes Mother    Stroke Mother    Hypertension Mother    High Cholesterol Mother    Heart disease Mother    Diabetes Brother    Breast cancer Daughter 38       unsure if did genetic testing   Breast cancer Maternal Aunt  Breast cancer Sister 3   Colon polyps Brother    Social History   Socioeconomic History   Marital status: Widowed    Spouse name: Not on file   Number of children: 2   Years of education: Not on file   Highest education level: Not on file  Occupational History   Not on file  Tobacco Use   Smoking status: Never   Smokeless tobacco: Never  Vaping Use   Vaping status: Never Used  Substance and Sexual Activity   Alcohol use: No   Drug use: No   Sexual activity: Not Currently  Other Topics Concern   Not on file  Social History Narrative   Married 2 daughters   Not working outside home   no caffeine   12/02/2014   Social Drivers of Health   Financial Resource Strain: Low Risk  (10/15/2023)   Overall Financial Resource Strain (CARDIA)    Difficulty of Paying Living Expenses: Not hard at all  Food Insecurity: No Food Insecurity (10/15/2023)   Hunger Vital Sign    Worried About Running Out of Food in the Last Year: Never true    Ran Out of Food in the Last Year: Never true  Transportation Needs: No Transportation Needs (10/15/2023)   PRAPARE - Administrator, Civil Service (Medical): No    Lack of Transportation (Non-Medical): No  Physical Activity: Inactive (10/15/2023)   Exercise Vital Sign    Days of Exercise per Week: 0 days    Minutes of Exercise per Session: 0 min  Stress: No Stress Concern  Present (10/15/2023)   [employer] of Occupational Health - Occupational Stress Questionnaire    Feeling of Stress: Not at all  Social Connections: Moderately Isolated (10/15/2023)   Social Connection and Isolation Panel    Frequency of Communication with Friends and Family: More than three times a week    Frequency of Social Gatherings with Friends and Family: Not on file    Attends Religious Services: More than 4 times per year    Active Member of [employer] or Organizations: No    Attends Banker Meetings: Never    Marital Status: Widowed    Tobacco Counseling Counseling given: Not Answered    Clinical Intake:  Pre-visit preparation completed: Yes  Pain : No/denies pain     Nutritional Risks: Nausea/ vomitting/ diarrhea (nausea once) Diabetes: Yes CBG done?: No Did pt. bring in CBG monitor from home?: No  Lab Results  Component Value Date   HGBA1C 6.6 (A) 08/02/2023   HGBA1C 7.1 (H) 04/23/2023   HGBA1C 6.4 01/19/2023     How often do you need to have someone help you when you read instructions, pamphlets, or other written materials from your doctor or pharmacy?: 1 - Never  Interpreter Needed?: No  Information entered by :: NAllen LPN   Activities of Daily Living     10/15/2023   10:09 AM  In your present state of health, do you have any difficulty performing the following activities:  Hearing? 0  Vision? 0  Difficulty concentrating or making decisions? 1  Walking or climbing stairs? 0  Dressing or bathing? 0  Doing errands, shopping? 1  Comment has someone with her  Preparing Food and eating ? N  Using the Toilet? N  In the past six months, have you accidently leaked urine? Y  Do you have problems with loss of bowel control? N  Managing your Medications? N  Managing your Finances?  N  Housekeeping or managing your Housekeeping? N    Patient Care Team: Nche, Roselie Rockford, NP as PCP - General (Internal Medicine) Sheard, Jaylene KIDD, DPM  (Inactive) as Consulting Physician (Podiatry) Ladora, My [place], [place] as Referring Physician (Optometry)  I have updated your Care Teams any recent Medical Services you may have received from other providers in the past year.     Assessment:   This is a routine wellness examination for Sandra Ray.  Hearing/Vision screen Hearing Screening - Comments:: Denies hearing issues Vision Screening - Comments:: Regular eye exams, [facility]   Goals Addressed             This Visit's Progress    Patient Stated       10/15/2023, work out in the garden       Depression Screen     10/15/2023   10:17 AM 05/22/2023   10:17 AM 10/13/2022    1:54 PM 08/30/2021    1:41 PM 08/30/2021    1:37 PM 08/30/2021    1:35 PM 05/30/2021   10:59 AM  PHQ 2/9 Scores  PHQ - 2 Score 1 0 0 0 0 0 0  PHQ- 9 Score 1 4 0    1    Fall Risk     10/15/2023   10:16 AM 05/22/2023   10:11 AM 10/13/2022    1:51 PM 08/30/2021    1:37 PM 05/30/2021   10:05 AM  Fall Risk   Falls in the past year? 0 1 1 0 0  Comment   tripped on shoe    Number falls in past yr: 0 0 0 0 0  Injury with Fall? 0 1 1 0 0  Comment   broke ribs    Risk for fall due to : Medication side effect History of fall(s) Medication side effect  No Fall Risks  Follow up Falls evaluation completed;Falls prevention discussed Falls evaluation completed;Falls prevention discussed Falls prevention discussed;Falls evaluation completed Falls evaluation completed;Education provided  Falls evaluation completed      Data saved with a previous flowsheet row definition    MEDICARE RISK AT HOME:  Medicare Risk at Home Any stairs in or around the home?: Yes If so, are there any without handrails?: No Home free of loose throw rugs in walkways, pet beds, electrical cords, etc?: Yes Adequate lighting in your home to reduce risk of falls?: Yes Life alert?: No Use of a cane, walker or w/c?: No Grab bars in the bathroom?: Yes Shower chair or bench in shower?: Yes Elevated toilet  seat or a handicapped toilet?: Yes  TIMED UP AND GO:  Was the test performed?  No  Cognitive Function: 6CIT completed    09/28/2023    2:05 PM 03/28/2016    1:27 PM  MMSE - Mini Mental State Exam  Orientation to time 4 5   Orientation to Place 5 5   Registration 2 3   Attention/ Calculation 0 5   Recall 0 3   Language- name 2 objects 2 2   Language- repeat 1 1  Language- follow 3 step command 3 3   Language- read & follow direction 1 1   Write a sentence 1 1   Copy design 1 1   Total score 20 30      Data saved with a previous flowsheet row definition        10/15/2023   10:18 AM 08/03/2020   11:32 AM  6CIT Screen  What Year?  0 points 0 points  What month? 0 points 0 points  What time? 0 points 0 points  Count back from 20 0 points 0 points  Months in reverse 4 points 2 points  Repeat phrase 6 points 2 points  Total Score 10 points 4 points    Immunizations Immunization History  Administered Date(s) Administered   Fluad Quad(high Dose 65+) 11/29/2020, 11/30/2021   Influenza Split 12/19/2011   Influenza Whole 12/17/2008, 12/22/2009   Influenza, High Dose Seasonal PF 12/25/2014, 11/18/2015, 11/23/2016, 12/14/2017, 10/31/2018, 12/13/2022   Influenza,inj,Quad PF,6+ Mos 12/18/2012, 12/23/2013   Influenza,inj,quad, With Preservative 12/11/2016   Influenza-Unspecified 11/24/2019, 11/29/2020, 11/30/2021, 12/13/2022   PFIZER Comirnaty(Gray Top)Covid-19 Tri-Sucrose Vaccine 06/14/2020   PFIZER(Purple Top)SARS-COV-2 Vaccination 04/18/2019, 05/09/2019, 12/06/2019, 06/14/2020   Pneumococcal Conjugate-13 12/23/2013, 08/30/2015, 09/07/2015   Pneumococcal Polysaccharide-23 06/19/2011, 01/16/2017   Td 02/23/2004   Tdap 01/30/2015   Zoster, Live 03/28/2010    Screening Tests Health Maintenance  Topic Date Due   Zoster Vaccines- Shingrix (1 of 2) 07/28/1995   OPHTHALMOLOGY EXAM  02/24/2022   COVID-19 Vaccine (6 - 2024-25 season) 11/12/2022   INFLUENZA VACCINE  10/12/2023    HEMOGLOBIN A1C  02/02/2024   Diabetic kidney evaluation - eGFR measurement  04/22/2024   Diabetic kidney evaluation - Urine ACR  04/22/2024   FOOT EXAM  04/29/2024   Medicare Annual Wellness (AWV)  10/14/2024   DTaP/Tdap/Td (3 - Td or Tdap) 01/29/2025   Pneumococcal Vaccine: 50+ Years  Completed   DEXA SCAN  Completed   Hepatitis C Screening  Completed   Hepatitis B Vaccines  Aged Out   HPV VACCINES  Aged Out   Meningococcal B Vaccine  Aged Out   Colonoscopy  Discontinued    Health Maintenance  Health Maintenance Due  Topic Date Due   Zoster Vaccines- Shingrix (1 of 2) 07/28/1995   OPHTHALMOLOGY EXAM  02/24/2022   COVID-19 Vaccine (6 - 2024-25 season) 11/12/2022   INFLUENZA VACCINE  10/12/2023   Health Maintenance Items Addressed: Declines covid vaccine. Due for shingles vaccine. Requesting eye exam.  Additional Screening:  Vision Screening: Recommended annual ophthalmology exams for early detection of glaucoma and other disorders of the eye. Would you like a referral to an eye doctor? No    Dental Screening: Recommended annual dental exams for proper oral hygiene  Community Resource Referral / Chronic Care Management: CRR required this visit?  No   CCM required this visit?  No   Plan:    I have personally reviewed and noted the following in the patient's chart:   Medical and social history Use of alcohol, tobacco or illicit drugs  Current medications and supplements including opioid prescriptions. Patient is not currently taking opioid prescriptions. Functional ability and status Nutritional status Physical activity Advanced directives List of other physicians Hospitalizations, surgeries, and ER visits in previous 12 months Vitals Screenings to include cognitive, depression, and falls Referrals and appointments  In addition, I have reviewed and discussed with patient certain preventive protocols, quality metrics, and best practice recommendations. A written  personalized care plan for preventive services as well as general preventive health recommendations were provided to patient.   Sandra FORBES Dawn, LPN   03/17/7972   After Visit Summary: (Pick Up) Due to this being a telephonic visit, with patients personalized plan was offered to patient and patient has requested to Pick up at office.  Notes: Nothing significant to report at this time.

## 2023-10-15 NOTE — Patient Instructions (Signed)
 Ms. Borrelli , Thank you for taking time out of your busy schedule to complete your Annual Wellness Visit with me. I enjoyed our conversation and look forward to speaking with you again next year. I, as well as your care team,  appreciate your ongoing commitment to your health goals. Please review the following plan we discussed and let me know if I can assist you in the future. Your Game plan/ To Do List    Referrals: If you haven't heard from the office you've been referred to, please reach out to them at the phone provided.   Follow up Visits: We will see or speak with you next year for your Next Medicare AWV with our clinical staff Have you seen your provider in the last 6 months (3 months if uncontrolled diabetes)? Yes  Clinician Recommendations:  Aim for 30 minutes of exercise or brisk walking, 6-8 glasses of water, and 5 servings of fruits and vegetables each day.       This is a list of the screenings recommended for you:  Health Maintenance  Topic Date Due   Zoster (Shingles) Vaccine (1 of 2) 07/28/1995   Eye exam for diabetics  02/24/2022   COVID-19 Vaccine (6 - 2024-25 season) 11/12/2022   Flu Shot  10/12/2023   Hemoglobin A1C  02/02/2024   Yearly kidney function blood test for diabetes  04/22/2024   Yearly kidney health urinalysis for diabetes  04/22/2024   Complete foot exam   04/29/2024   Medicare Annual Wellness Visit  10/14/2024   DTaP/Tdap/Td vaccine (3 - Td or Tdap) 01/29/2025   Pneumococcal Vaccine for age over 41  Completed   DEXA scan (bone density measurement)  Completed   Hepatitis C Screening  Completed   Hepatitis B Vaccine  Aged Out   HPV Vaccine  Aged Out   Meningitis B Vaccine  Aged Out   Colon Cancer Screening  Discontinued    Advanced directives: (Copy Requested) Please bring a copy of your health care power of attorney and living will to the office to be added to your chart at your convenience. You can mail to [facility] [address]  [address] [place], [place] [address] or email to [email or web address] Advance Care Planning is important because it:  [x]  Makes sure you receive the medical care that is consistent with your values, goals, and preferences  [x]  It provides guidance to your family and loved ones and reduces their decisional burden about whether or not they are making the right decisions based on your wishes.  Follow the link provided in your after visit summary or read over the paperwork we have mailed to you to help you started getting your Advance Directives in place. If you need assistance in completing these, please reach out to us  so that we can help you!  See attachments for Preventive Care and Fall Prevention Tips.

## 2023-10-19 ENCOUNTER — Ambulatory Visit
Admission: RE | Admit: 2023-10-19 | Discharge: 2023-10-19 | Disposition: A | Discharge status: Home / Self Care | Source: Ambulatory Visit | Attending: Nurse Practitioner | Admitting: Nurse Practitioner

## 2023-10-19 ENCOUNTER — Telehealth: Payer: Self-pay | Admitting: Nurse Practitioner

## 2023-10-19 DIAGNOSIS — R413 Other amnesia: Secondary | ICD-10-CM

## 2023-10-19 DIAGNOSIS — F419 Anxiety disorder, unspecified: Secondary | ICD-10-CM

## 2023-10-19 MED ORDER — ALPRAZOLAM 0.5 MG PO TABS: 0.5000 mg | ORAL_TABLET | Freq: Once | ORAL | 0 refills | Status: AC

## 2023-10-19 NOTE — Telephone Encounter (Signed)
 Pt daughter is on the phone because the pt is suppose to get a MRI today at 4:30 and the daughter want to know if the dr can call in a volume because the pt is claustiphobic

## 2023-10-21 ENCOUNTER — Ambulatory Visit: Payer: Self-pay | Admitting: Nurse Practitioner

## 2023-11-06 ENCOUNTER — Encounter: Payer: Self-pay | Admitting: Endocrinology

## 2023-11-06 ENCOUNTER — Ambulatory Visit: Payer: Self-pay | Admitting: Endocrinology

## 2023-11-06 ENCOUNTER — Ambulatory Visit (INDEPENDENT_AMBULATORY_CARE_PROVIDER_SITE_OTHER): Admitting: Endocrinology

## 2023-11-06 VITALS — BP 122/70 | HR 70 | Resp 20 | Ht 68.0 in | Wt 166.8 lb

## 2023-11-06 DIAGNOSIS — Z7984 Long term (current) use of oral hypoglycemic drugs: Secondary | ICD-10-CM | POA: Diagnosis not present

## 2023-11-06 DIAGNOSIS — E118 Type 2 diabetes mellitus with unspecified complications: Secondary | ICD-10-CM

## 2023-11-06 DIAGNOSIS — Z7985 Long-term (current) use of injectable non-insulin antidiabetic drugs: Secondary | ICD-10-CM | POA: Diagnosis not present

## 2023-11-06 DIAGNOSIS — E785 Hyperlipidemia, unspecified: Secondary | ICD-10-CM

## 2023-11-06 LAB — POCT GLYCOSYLATED HEMOGLOBIN (HGB A1C): Hemoglobin A1C: 6.5 % — AB (ref 4.0–5.6)

## 2023-11-06 MED ORDER — TIRZEPATIDE 7.5 MG/0.5ML ~~LOC~~ SOAJ
7.5000 mg | SUBCUTANEOUS | 4 refills | Status: AC
Start: 2023-11-06 — End: ?

## 2023-11-06 NOTE — Patient Instructions (Signed)
 Stop Amaryl  2 mg daily in the morning. Farxiga  10 mg daily. Mounjaro  7.5 mg weekly.

## 2023-11-06 NOTE — Progress Notes (Signed)
 Outpatient Endocrinology Note Sandra Alexarae Oliva, MD  11/06/23  Patient's Name: Sandra Ray    DOB: 1945/09/06    MRN: [id number]                                                    REASON OF VISIT: Follow up of type 2 diabetes mellitus  PCP: Katheen Roselie Rockford, NP  HISTORY OF PRESENT ILLNESS:   Sandra Ray is a 78 y.o. old female with past medical history listed below, is here for follow up for type 2 diabetes mellitus.    Pertinent Diabetes History: Patient was diagnosed with type 2 diabetes mellitus in 1996.  Chronic Diabetes Complications : Retinopathy: no. Last ophthalmology exam was done on annually, 08/2023, following with ophthalmology regularly.  Nephropathy: CKD III, on ACE/ARB / lisinopril .  Peripheral neuropathy: yes, on gabapentin  300 mg 3 times a day from PCP.  Had back surgery in September 2023. Coronary artery disease: no Stroke: no  Relevant comorbidities and cardiovascular risk factors: Obesity: no Body mass index is 25.36 kg/m.  Hypertension: Yes  Hyperlipidemia : Yes, on statin   Current / Home Diabetic regimen includes:  Amaryl  2 mg daily in the morning. Farxiga  10 mg daily. Mounjaro  5 mg weekly.   Prior diabetic medications: She had taken glipizide  and Actos in the past.  Actos was stopped due to significant weight gain.  She had taken Januvia , Tradjenta  and metformin  in the past.  Nausea with high dose of metformin .  Metformin  is stopped due to diarrhea.  Glycemic data:    One Touch Verio reflect glucometer.  Reviewed from August 12 to November 06, 2023 average blood sugar 147.  She has been checking blood sugar mostly in the morning fasting some of the blood sugar 191, 172, 167, 145, 148, 126, 120, 124, 143.  Mild hyperglycemia. hyperglycemia.  No hypoglycemia.  Hypoglycemia: Patient has no hypoglycemic episodes. Patient has hypoglycemia awareness.  Factors modifying glucose control: 1.  Diabetic diet assessment: 3 meals a day.  Eating more of the  bedside table.  2.  Staying active or exercising: No formal exercise.  3.  Medication compliance: compliant all of the time.  Interval history  Glucometer data as reviewed above.  Mild hyperglycemia lately.  Patient reports she had death of her husband about a month ago, was stressful and was eating differently.  Hemoglobin A1c 6.5% today.  She denies any hypoglycemic symptoms.  Diabetes regimen as reviewed and noted above.  She has been tolerating Mounjaro  well.  Denies any nausea or any GI upset.  No other complaints today.  She is accompanied by her granddaughter in the clinic today.  REVIEW OF SYSTEMS As per history of present illness.   PAST MEDICAL HISTORY: Past Medical History:  Diagnosis Date   Abdominal wall abscess, right upper quadrant 04/07/2013   Arthritis    Benign neoplasm of skin 03/28/2010   Qualifier: Diagnosis of   By: Josetta Corrigan      IMO SNOMED Dx Update Oct 2024     Breast abscess 2000   right breast   Carpal tunnel syndrome    Complication of anesthesia    Degenerative disc disease    Diabetes mellitus type II    Eczema of hand    Hx of colonic polyps 12/02/2014   2004 - 2 diminutive polyps  Hyperlipidemia    Hypertension    HYPOKALEMIA 06/10/2008   Qualifier: Diagnosis of   By: Mahlon MD, [place] Donna nail 12/08/2013   Metatarsal deformity    Metatarsalgia of right foot 12/08/2013   Muscle spasm 09/18/2011   Ovarian cyst 07/2017   left    Pain in lower limb 12/08/2013   PONV (postoperative nausea and vomiting)    Shingles 2000   Tubular adenoma of colon     PAST SURGICAL HISTORY: Past Surgical History:  Procedure Laterality Date   ABDOMINAL HYSTERECTOMY  1981   menorrhagia   ABSCESS DRAINAGE Right    breast   APPENDECTOMY  1966   done with CSxn. Prophylactic   BREAST BIOPSY Left    carpel tunnel Right    CESAREAN SECTION     x2   COLONOSCOPY     LUMBAR LAMINECTOMY     [id number]. 2011 L4,L5,S1   Right rotator cuff  repair 09/2015     ROBOTIC ASSISTED SALPINGO OOPHERECTOMY Left 04/16/2018   Procedure: XI ROBOTIC ASSISTED  LEFT SALPINGO OOPHORECTOMY;  Surgeon: Anitra Freddy NOVAK, MD;  Location: [facility];  Service: Gynecology;  Laterality: Left;   SPINAL CORD STIMULATOR INSERTION N/A 12/08/2021   Procedure: PLACEMENT OF SPINAL CORD STIMULATOR;  Surgeon: Burnetta Aures, MD;  Location: [facility];  Service: Orthopedics;  Laterality: N/A;  2.5 hrs 3 C-Bed   TUBAL LIGATION      ALLERGIES: Allergies  Allergen Reactions   Penicillins     Causes yeast infections Did it involve swelling of the face/tongue/throat, SOB, or low BP? No Did it involve sudden or severe rash/hives, skin peeling, or any reaction on the inside of your mouth or nose? No Did you need to seek medical attention at a hospital or doctor's office? No When did it last happen?  2018     If all above answers are NO, may proceed with cephalosporin use.  Tolerated cefazolin  in 2020   Amoxicillin  Other (See Comments)    Pt states it causes her a yeast infection  Causes yeast infections Did it involve swelling of the face/tongue/throat, SOB, or low BP? No Did it involve sudden or severe rash/hives, skin peeling, or any reaction on the inside of your mouth or nose? No Did you need to seek medical attention at a hospital or doctor's office? No When did it last happen?  2018     If all above answers are NO, may proceed with cephalosporin use.  Tolerated cefazolin  in 2020   Erythromycin     Passed out while taking    FAMILY HISTORY:  Family History  Problem Relation Age of Onset   Hypertension Father    Diabetes Mother    Stroke Mother    Hypertension Mother    High Cholesterol Mother    Heart disease Mother    Diabetes Brother    Breast cancer Daughter 22       unsure if did genetic testing   Breast cancer Maternal Aunt    Breast cancer Sister 11   Colon polyps Brother     SOCIAL HISTORY: Social History   Socioeconomic History    Marital status: Widowed    Spouse name: Not on file   Number of children: 2   Years of education: Not on file   Highest education level: Not on file  Occupational History   Not on file  Tobacco Use   Smoking status: Never   Smokeless  tobacco: Never  Vaping Use   Vaping status: Never Used  Substance and Sexual Activity   Alcohol use: No   Drug use: No   Sexual activity: Not Currently  Other Topics Concern   Not on file  Social History Narrative   Married 2 daughters   Not working outside home   no caffeine   12/02/2014   Social Drivers of Health   Financial Resource Strain: Low Risk  (10/15/2023)   Overall Financial Resource Strain (CARDIA)    Difficulty of Paying Living Expenses: Not hard at all  Food Insecurity: No Food Insecurity (10/15/2023)   Hunger Vital Sign    Worried About Running Out of Food in the Last Year: Never true    Ran Out of Food in the Last Year: Never true  Transportation Needs: No Transportation Needs (10/15/2023)   PRAPARE - Administrator, Civil Service (Medical): No    Lack of Transportation (Non-Medical): No  Physical Activity: Inactive (10/15/2023)   Exercise Vital Sign    Days of Exercise per Week: 0 days    Minutes of Exercise per Session: 0 min  Stress: No Stress Concern Present (10/15/2023)   [employer] of Occupational Health - Occupational Stress Questionnaire    Feeling of Stress: Not at all  Social Connections: Moderately Isolated (10/15/2023)   Social Connection and Isolation Panel    Frequency of Communication with Friends and Family: More than three times a week    Frequency of Social Gatherings with Friends and Family: Not on file    Attends Religious Services: More than 4 times per year    Active Member of [employer] or Organizations: No    Attends Banker Meetings: Never    Marital Status: Widowed    MEDICATIONS:  Current Outpatient Medications  Medication Sig Dispense Refill   acetaminophen  (TYLENOL ) 650 MG  CR tablet Take 1,300 mg by mouth 2 (two) times daily.     atorvastatin  (LIPITOR) 40 MG tablet Take 1 tablet (40 mg total) by mouth at bedtime. 90 tablet 3   calcium  carbonate (TUMS - DOSED IN MG ELEMENTAL CALCIUM ) 500 MG chewable tablet Chew 1-2 tablets by mouth daily as needed for indigestion or heartburn.     diphenhydramine-acetaminophen  (TYLENOL  PM) 25-500 MG TABS tablet Take 3 tablets by mouth at bedtime.     FARXIGA  10 MG TABS tablet Take 1 tablet (10 mg total) by mouth daily before breakfast. 90 tablet 30   fenofibrate  (TRICOR ) 145 MG tablet TAKE 1 TABLET BY MOUTH DAILY 100 tablet 2   fluticasone  (FLONASE ) 50 MCG/ACT nasal spray Place 2 sprays into both nostrils daily. 16 g 1   gabapentin  (NEURONTIN ) 300 MG capsule TAKE 1 CAPSULE BY MOUTH 3 TIMES  DAILY 300 capsule 1   hydrochlorothiazide  (HYDRODIURIL ) 25 MG tablet TAKE 1 TABLET BY MOUTH DAILY 90 tablet 3   Lancets (ONETOUCH ULTRASOFT) lancets Use as instructed once day.  E11.40 100 each 5   lisinopril  (ZESTRIL ) 5 MG tablet TAKE 1 TABLET (5 MG TOTAL) BY  MOUTH DAILY. NEEDS TO MAINTAIN  APPOINTMENT WITH PCP ON  05/22/2023 FOR ADDITIONAL REFILLS 90 tablet 3   ondansetron  (ZOFRAN ) 4 MG tablet Take 1 tablet (4 mg total) by mouth every 8 (eight) hours as needed for nausea or vomiting. 30 tablet 5   ONETOUCH VERIO test strip USE AS INSTRUCTED TO CHECK BLOOD SUGAR TWICE DAILY 200 strip 2   tirzepatide  (MOUNJARO ) 7.5 MG/0.5ML Pen Inject 7.5 mg into the skin  once a week. 6 mL 4   cyanocobalamin (VITAMIN B12) 1000 MCG tablet Take 1,000 mcg by mouth daily. (Patient not taking: Reported on 11/06/2023)     No current facility-administered medications for this visit.    PHYSICAL EXAM: Vitals:   11/06/23 1320  BP: 122/70  Pulse: 70  Resp: 20  SpO2: 96%  Weight: 166 lb 12.8 oz (75.7 kg)  Height: 5' 8 (1.727 m)    Body mass index is 25.36 kg/m.  Wt Readings from Last 3 Encounters:  11/06/23 166 lb 12.8 oz (75.7 kg)  10/09/23 167 lb 3.2 oz (75.8  kg)  09/28/23 164 lb 3.2 oz (74.5 kg)    General: Well developed, well nourished female in no apparent distress.  HEENT: AT/, no external lesions.  Eyes: Conjunctiva clear and no icterus. Neck: Neck supple  Lungs: Respirations not labored Neurologic: Alert, oriented, normal speech Extremities / Skin: Dry.  Psychiatric: Does not appear depressed or anxious  Diabetic Foot Exam - Simple   No data filed    LABS Reviewed Lab Results  Component Value Date   HGBA1C 6.5 (A) 11/06/2023   HGBA1C 6.6 (A) 08/02/2023   HGBA1C 7.1 (H) 04/23/2023   Lab Results  Component Value Date   FRUCTOSAMINE 237 03/28/2019   FRUCTOSAMINE 263 05/14/2018   FRUCTOSAMINE 328 (H) 07/07/2016   Lab Results  Component Value Date   CHOL 141 04/23/2023   HDL 44 (L) 04/23/2023   LDLCALC 70 04/23/2023   LDLDIRECT 89.0 11/30/2021   TRIG 194 (H) 04/23/2023   CHOLHDL 3.2 04/23/2023   Lab Results  Component Value Date   MICRALBCREAT 8 04/23/2023   MICRALBCREAT 0.5 03/28/2010   Lab Results  Component Value Date   CREATININE 0.78 04/23/2023   Lab Results  Component Value Date   GFR 52.46 (L) 01/19/2023    ASSESSMENT / PLAN  1. Controlled type 2 diabetes mellitus with complication, without long-term current use of insulin  (HCC)      Diabetes Mellitus type 2, complicated by CKD /neuropathy. - Diabetic status / severity: Controlled.  Lab Results  Component Value Date   HGBA1C 6.5 (A) 11/06/2023    - Hemoglobin A1c goal : <7%  Lately mild hyperglycemia.  In the last week blood sugar has been improving.  - Medications: see below.  Continue Farxiga  10 mg daily. Increase Mounjaro  from 5 to 7.5 mg weekly. Stop glimepiride .  She is currently taking 2 mg daily.  - Home glucose testing: In the morning fasting daily and at bedtime few times a week.  - Discussed/ Gave Hypoglycemia treatment plan.  # Consult : not required at this time.   # Annual urine for microalbuminuria/ creatinine  ratio, no microalbuminuria currently, continue ACE/ARB /lisinopril . Last  Lab Results  Component Value Date   MICRALBCREAT 8 04/23/2023    # Foot check nightly / neuropathy, continue gabapentin , managed by PCP.  # Annual dilated diabetic eye exams.   - Diet: Make healthy diabetic food choices - Life style / activity / exercise: Discussed.  2. Blood pressure  -  BP Readings from Last 1 Encounters:  11/06/23 122/70    - Control is in target.  - No change in current plans.  3. Lipid status / Hyperlipidemia - Last  Lab Results  Component Value Date   LDLCALC 70 04/23/2023   - Continue atorvastatin  40 mg daily.  Managed by primary care provider.  Diagnoses and all orders for this visit:  Controlled type 2 diabetes mellitus with  complication, without long-term current use of insulin  (HCC) -     POCT glycosylated hemoglobin (Hb A1C) -     tirzepatide  (MOUNJARO ) 7.5 MG/0.5ML Pen; Inject 7.5 mg into the skin once a week.    DISPOSITION Follow up in clinic in 3 months suggested.   All questions answered and patient verbalized understanding of the plan.  Sandra Marlicia Sroka, MD [facility] [address] [place], [address] [place], [place] [address] Phone # [phone]  At least part of this note was generated using voice recognition software. Inadvertent word errors may have occurred, which were not recognized during the proofreading process.

## 2023-11-22 ENCOUNTER — Encounter: Payer: Self-pay | Admitting: Nurse Practitioner

## 2023-11-22 ENCOUNTER — Ambulatory Visit: Admitting: Nurse Practitioner

## 2023-11-22 VITALS — BP 126/71 | HR 67 | Temp 98.1°F | Ht 68.0 in | Wt 167.0 lb

## 2023-11-22 DIAGNOSIS — Z7984 Long term (current) use of oral hypoglycemic drugs: Secondary | ICD-10-CM | POA: Diagnosis not present

## 2023-11-22 DIAGNOSIS — E785 Hyperlipidemia, unspecified: Secondary | ICD-10-CM

## 2023-11-22 DIAGNOSIS — R051 Acute cough: Secondary | ICD-10-CM

## 2023-11-22 DIAGNOSIS — Z7985 Long-term (current) use of injectable non-insulin antidiabetic drugs: Secondary | ICD-10-CM | POA: Diagnosis not present

## 2023-11-22 DIAGNOSIS — E1169 Type 2 diabetes mellitus with other specified complication: Secondary | ICD-10-CM

## 2023-11-22 DIAGNOSIS — E118 Type 2 diabetes mellitus with unspecified complications: Secondary | ICD-10-CM

## 2023-11-22 LAB — COMPREHENSIVE METABOLIC PANEL WITH GFR
ALT: 18 U/L (ref 0–35)
AST: 22 U/L (ref 0–37)
Albumin: 4.5 g/dL (ref 3.5–5.2)
Alkaline Phosphatase: 50 U/L (ref 39–117)
BUN: 23 mg/dL (ref 6–23)
CO2: 30 meq/L (ref 19–32)
Calcium: 10.3 mg/dL (ref 8.4–10.5)
Chloride: 96 meq/L (ref 96–112)
Creatinine, Ser: 0.79 mg/dL (ref 0.40–1.20)
GFR: 71.7 mL/min (ref >60.00)
Glucose, Bld: 114 mg/dL — ABNORMAL HIGH (ref 70–99)
Potassium: 3.8 meq/L (ref 3.5–5.1)
Sodium: 136 meq/L (ref 135–145)
Total Bilirubin: 0.6 mg/dL (ref 0.2–1.2)
Total Protein: 7.6 g/dL (ref 6.0–8.3)

## 2023-11-22 LAB — POCT INFLUENZA A/B
Influenza A, POC: NEGATIVE
Influenza B, POC: NEGATIVE

## 2023-11-22 LAB — LDL CHOLESTEROL, DIRECT: Direct LDL: 84 mg/dL

## 2023-11-22 LAB — POC COVID19 BINAXNOW: SARS Coronavirus 2 Ag: NEGATIVE

## 2023-11-22 MED ORDER — ONETOUCH VERIO VI STRP: 1.0000 | ORAL_STRIP | Freq: Every day | 2 refills | Status: DC

## 2023-11-22 MED ORDER — BENZONATATE 200 MG PO CAPS: 200.0000 mg | ORAL_CAPSULE | Freq: Three times a day (TID) | ORAL | 0 refills | Status: DC | PRN

## 2023-11-22 MED ORDER — ONETOUCH ULTRASOFT LANCETS MISC: 1.0000 | Freq: Every day | 2 refills | Status: DC

## 2023-11-22 NOTE — Assessment & Plan Note (Signed)
 Under the care of endocrinology Repeat LDL, CMP and UACr

## 2023-11-22 NOTE — Patient Instructions (Addendum)
 Go to lab Negative COVID and flu swab Alternate between mucinex  Dm and benzonatate  for cough. Encourage adequate oral hydration. Avoid decongestants if you have high blood pressure.  Ok to use Coricidin HBP for chest and sinus congestion You can use plain Tylenol  or Advil  for fever, chills and achyness. Use cool mist humidifier at bedtime to help with nasal congestion and cough.  Cold/cough medications may have tylenol  or ibuprofen  or guaifenesin  or dextromethophan in them, so be careful not to take beyond the recommended dose for each of these medications.   Common cold symptoms are usually triggered by a virus.  The antibiotics are usually not necessary. On average, a viral cold illness may take 7-10 days to resolve. Please, make an appointment if you are not better or if you're worse.

## 2023-11-22 NOTE — Progress Notes (Signed)
 Established Patient Visit  Patient: Sandra Ray   DOB: 03-Jun-1945   78 y.o. Female  MRN: [id number] Visit Date: 11/22/2023  Subjective:    Chief Complaint  Patient presents with   Follow-up    NOT FASTING 6 month follow up for DM, HTN, Hyperlipemia  Cough for 3 days dry  Refill lancets and test strips    URI  The current episode started in the past 7 days. The problem has been unchanged. There has been no fever. Associated symptoms include congestion and coughing. Pertinent negatives include no abdominal pain, chest pain, diarrhea, dysuria, ear pain, headaches, joint pain, joint swelling, nausea, neck pain, plugged ear sensation, rash, rhinorrhea, sinus pain, sneezing, sore throat, swollen glands, vomiting or wheezing. She has tried nothing for the symptoms.   Diabetes mellitus type II, controlled (HCC) Under the care of endocrinology Repeat LDL, CMP and UACr  Hyperlipidemia associated with type 2 diabetes mellitus (HCC) Repeat LDL  Reviewed medical, surgical, and social history today  Medications: Outpatient Medications Prior to Visit  Medication Sig   acetaminophen  (TYLENOL ) 650 MG CR tablet Take 1,300 mg by mouth 2 (two) times daily. (Patient taking differently: Take 1,300 mg by mouth every 8 (eight) hours as needed for pain.)   atorvastatin  (LIPITOR) 40 MG tablet Take 1 tablet (40 mg total) by mouth at bedtime.   calcium  carbonate (TUMS - DOSED IN MG ELEMENTAL CALCIUM ) 500 MG chewable tablet Chew 1-2 tablets by mouth daily as needed for indigestion or heartburn.   diphenhydramine-acetaminophen  (TYLENOL  PM) 25-500 MG TABS tablet Take 3 tablets by mouth at bedtime.   FARXIGA  10 MG TABS tablet Take 1 tablet (10 mg total) by mouth daily before breakfast.   fenofibrate  (TRICOR ) 145 MG tablet TAKE 1 TABLET BY MOUTH DAILY   fluticasone  (FLONASE ) 50 MCG/ACT nasal spray Place 2 sprays into both nostrils daily. (Patient taking differently: Place 2 sprays into both nostrils  as needed.)   gabapentin  (NEURONTIN ) 300 MG capsule TAKE 1 CAPSULE BY MOUTH 3 TIMES  DAILY   hydrochlorothiazide  (HYDRODIURIL ) 25 MG tablet TAKE 1 TABLET BY MOUTH DAILY   lisinopril  (ZESTRIL ) 5 MG tablet TAKE 1 TABLET (5 MG TOTAL) BY  MOUTH DAILY. NEEDS TO MAINTAIN  APPOINTMENT WITH PCP ON  05/22/2023 FOR ADDITIONAL REFILLS   ondansetron  (ZOFRAN ) 4 MG tablet Take 1 tablet (4 mg total) by mouth every 8 (eight) hours as needed for nausea or vomiting.   tirzepatide  (MOUNJARO ) 7.5 MG/0.5ML Pen Inject 7.5 mg into the skin once a week.   [DISCONTINUED] Lancets (ONETOUCH ULTRASOFT) lancets Use as instructed once day.  E11.40   [DISCONTINUED] ONETOUCH VERIO test strip USE AS INSTRUCTED TO CHECK BLOOD SUGAR TWICE DAILY   [DISCONTINUED] cyanocobalamin (VITAMIN B12) 1000 MCG tablet Take 1,000 mcg by mouth daily. (Patient not taking: Reported on 11/22/2023)   No facility-administered medications prior to visit.   Reviewed past medical and social history.   ROS per HPI above      Objective:  BP 126/71 (BP Location: Left Arm, Patient Position: Sitting, Cuff Size: Normal)   Pulse 67   Temp 98.1 F (36.7 C) (Oral)   Ht 5' 8 (1.727 m)   Wt 167 lb (75.8 kg)   SpO2 98%   BMI 25.39 kg/m      Physical Exam Vitals and nursing note reviewed.  Cardiovascular:     Rate and Rhythm: Normal rate and regular rhythm.  Pulses: Normal pulses.     Heart sounds: Normal heart sounds.  Pulmonary:     Effort: Pulmonary effort is normal.     Breath sounds: Normal breath sounds.  Musculoskeletal:     Right lower leg: No edema.     Left lower leg: No edema.  Neurological:     Mental Status: She is alert and oriented to person, place, and time.     Results for orders placed or performed in visit on 11/22/23  Comprehensive metabolic panel with GFR  Result Value Ref Range   Sodium 136 135 - 145 mEq/L   Potassium 3.8 3.5 - 5.1 mEq/L   Chloride 96 96 - 112 mEq/L   CO2 30 19 - 32 mEq/L   Glucose, Bld 114  (H) 70 - 99 mg/dL   BUN 23 6 - 23 mg/dL   Creatinine, Ser 9.20 0.40 - 1.20 mg/dL   Total Bilirubin 0.6 0.2 - 1.2 mg/dL   Alkaline Phosphatase 50 39 - 117 U/L   AST 22 0 - 37 U/L   ALT 18 0 - 35 U/L   Total Protein 7.6 6.0 - 8.3 g/dL   Albumin 4.5 3.5 - 5.2 g/dL   GFR 28.29 >39.99 mL/min   Calcium  10.3 8.4 - 10.5 mg/dL  Direct LDL  Result Value Ref Range   Direct LDL 84.0 mg/dL  POC RNCPI-80  Result Value Ref Range   SARS Coronavirus 2 Ag Negative Negative  POCT Influenza A/B  Result Value Ref Range   Influenza A, POC Negative Negative   Influenza B, POC Negative Negative      Assessment & Plan:    Problem List Items Addressed This Visit     Diabetes mellitus type II, controlled (HCC) - Primary   Under the care of endocrinology Repeat LDL, CMP and UACr      Relevant Medications   Lancets (ONETOUCH ULTRASOFT) lancets   glucose blood (ONETOUCH VERIO) test strip   Other Relevant Orders   Comprehensive metabolic panel with GFR (Completed)   Direct LDL (Completed)   Hyperlipidemia associated with type 2 diabetes mellitus (HCC)   Repeat LDL      Relevant Orders   Direct LDL (Completed)   Other Visit Diagnoses       Acute cough       Relevant Medications   benzonatate  (TESSALON ) 200 MG capsule   Other Relevant Orders   POC COVID-19 (Completed)   POCT Influenza A/B (Completed)     Long-term (current) use of injectable non-insulin  antidiabetic drugs         Long term (current) use of oral hypoglycemic drugs          Return in about 6 months (around 05/21/2024) for HTN, hyperlipidemia (fasting).     Roselie Mood, NP

## 2023-11-22 NOTE — Assessment & Plan Note (Signed)
 Repeat LDL

## 2023-11-26 ENCOUNTER — Other Ambulatory Visit: Payer: Self-pay | Admitting: Nurse Practitioner

## 2023-11-26 ENCOUNTER — Ambulatory Visit: Payer: Self-pay | Admitting: Nurse Practitioner

## 2023-11-26 DIAGNOSIS — E118 Type 2 diabetes mellitus with unspecified complications: Secondary | ICD-10-CM

## 2023-11-26 DIAGNOSIS — E785 Hyperlipidemia, unspecified: Secondary | ICD-10-CM

## 2023-11-26 MED ORDER — LANCETS MISC. MISC: 1.0000 | Freq: Every day | 2 refills | Status: AC

## 2023-11-26 MED ORDER — LANCET DEVICE MISC: 1.0000 | Freq: Every day | 0 refills | Status: AC

## 2023-11-26 MED ORDER — BLOOD GLUCOSE TEST VI STRP: 1.0000 | ORAL_STRIP | Freq: Every day | 2 refills | Status: AC

## 2023-11-26 MED ORDER — BLOOD GLUCOSE MONITORING SUPPL DEVI: 1.0000 | Freq: Every day | 0 refills | Status: AC

## 2024-01-01 ENCOUNTER — Ambulatory Visit: Payer: Self-pay

## 2024-01-01 NOTE — Telephone Encounter (Signed)
 FYI Only or Action Required?: Action required by provider: request for appointment and clinical question for provider.  Patient was last seen in primary care on 11/22/2023 by Nche, Roselie Rockford, NP.  Called Nurse Triage reporting No chief complaint on file..  Symptoms began today.  Interventions attempted: Rest, hydration, or home remedies.  Symptoms are: unchanged.  Triage Disposition: See PCP When Office is Open (Within 3 Days)  Patient/caregiver understands and will follow disposition?: Yes   Copied from CRM [id number]. Topic: Clinical - Red Word Triage >> Jan 01, 2024  3:52 PM Eva FALCON wrote: Red Word that prompted transfer to Nurse Triage: Granddaughter Rosina calling in and states she fell down the steps and hit her head, leg bent back weird. Reason for Disposition  [1] Taking Coumadin (warfarin) or other strong blood thinner AND [2] falling is a recurrent problem  Answer Assessment - Initial Assessment Questions 1. MECHANISM: How did the fall happen?     Fell down the steps halfway  2. DOMESTIC VIOLENCE AND ELDER ABUSE SCREENING: Did you fall because someone pushed you or tried to hurt you? If Yes, ask: Are you safe now?     Nno  3. ONSET: When did the fall happen? (e.g., minutes, hours, or days ago)     Today  4. LOCATION: What part of the body hit the ground? (e.g., back, buttocks, head, hips, knees, hands, head, stomach)     Left Side of the Head, [place]  5. INJURY: Did you hurt (injure) yourself when you fell? If Yes, ask: What did you injure? Tell me more about this? (e.g., body area; type of injury; pain severity)      No visible injury, small place on the elbow, small scrape  6. PAIN: Is there any pain? If Yes, ask: How bad is the pain? (e.g., Scale 0-10; or none, mild,      Denies  7. SIZE: For cuts, bruises, or swelling, ask: How large is it? (e.g., inches or centimeters)      Small area on the elbow  8. PREGNANCY: Is there any chance  you are pregnant? When was your last menstrual period?     NO and No  9. OTHER SYMPTOMS: Do you have any other symptoms? (e.g., dizziness, fever, weakness; new-onset or worsening).      Denies any other symptoms  10. CAUSE: What do you think caused the fall (or falling)? (e.g., dizzy spell, tripped)       Slipped down the stairs.  Protocols used: Falls and [facility]

## 2024-01-02 ENCOUNTER — Ambulatory Visit (INDEPENDENT_AMBULATORY_CARE_PROVIDER_SITE_OTHER): Admitting: Family Medicine

## 2024-01-02 ENCOUNTER — Encounter: Payer: Self-pay | Admitting: Family Medicine

## 2024-01-02 VITALS — BP 124/74 | HR 81 | Temp 98.0°F | Resp 16 | Ht 68.0 in | Wt 161.2 lb

## 2024-01-02 DIAGNOSIS — M545 Low back pain, unspecified: Secondary | ICD-10-CM

## 2024-01-02 NOTE — Telephone Encounter (Signed)
 Noted. Pt has been scheduled to be seen today with a provider at [facility]. Her appt is at 1245pm.

## 2024-01-02 NOTE — Progress Notes (Signed)
 Musculoskeletal Exam  Patient: Sandra Ray DOB: 01-07-1946  DOS: 01/02/2024  SUBJECTIVE:  Chief Complaint:   Chief Complaint  Patient presents with   Sandra Ray    Sandra Ray is a 78 y.o.  female for evaluation and treatment a fall. Here w granddaughter who helps w the hx.   Onset:  1 day ago around 1400. She had a fall where she slipped on some stairs; she reportedly hit the left side of her face on the wall and fell on her right lower back/buttock area. Progression of issue:  has improved Associated symptoms: none No bruising, redness, swelling.  She did not lose consciousness Reportedly hit the L side of her face.  Treatment: to date has been a topical menthol .  Neurovascular symptoms: no  Past Medical History:  Diagnosis Date   Abdominal wall abscess, right upper quadrant 04/07/2013   Arthritis    Benign neoplasm of skin 03/28/2010   Qualifier: Diagnosis of   By: Josetta Corrigan      IMO SNOMED Dx Update Oct 2024     Breast abscess 2000   right breast   Carpal tunnel syndrome    Complication of anesthesia    Degenerative disc disease    Diabetes mellitus type II    Eczema of hand    Hx of colonic polyps 12/02/2014   2004 - 2 diminutive polyps    Hyperlipidemia    Hypertension    HYPOKALEMIA 06/10/2008   Qualifier: Diagnosis of   By: Mahlon MD, [place] nail 12/08/2013   Metatarsal deformity    Metatarsalgia of right foot 12/08/2013   Muscle spasm 09/18/2011   Ovarian cyst 07/2017   left    Pain in lower limb 12/08/2013   PONV (postoperative nausea and vomiting)    Shingles 2000   Tubular adenoma of colon     Objective: VITAL SIGNS: BP 124/74 (BP Location: Left Arm, Patient Position: Sitting)   Pulse 81   Temp 98 F (36.7 C) (Oral)   Resp 16   Ht 5' 8 (1.727 m)   Wt 161 lb 3.2 oz (73.1 kg)   SpO2 98%   BMI 24.51 kg/m  Constitutional: Well formed, well developed. No acute distress. Thorax & Lungs: No accessory muscle  use Musculoskeletal:  Mild TTP over the left lumbar paraspinal musculature; there is no TTP over the spinal cord stimulator, lower thoracic/lumbar spine midline or right-sided paraspinal musculature.  No TTP over the SI joint, temporalis region, or zygomatic arch bilaterally. Neurologic: Normal sensory function. No focal deficits noted. DTR's equal and symmetric in LE's. No clonus.  5/5 strength in extremities.  Equal.  Gait is cautious, not antalgic. Psychiatric: Normal mood. Age appropriate judgment and insight. Alert & oriented x 3.    Assessment:  Bilateral low back pain, unspecified chronicity, unspecified whether sciatica present  Plan: Reassurance given after the fall, heat, ice, Tylenol .  She has no new pain currently.  They will let me know if anything changes. F/u with regular PCP as originally scheduled. The patient and granddaughter voiced understanding and agreement to the plan.   Mabel [place], DO 01/02/24  1:33 PM

## 2024-01-02 NOTE — Patient Instructions (Signed)
 Heat (pad or rice pillow in microwave) over affected area, 10-15 minutes twice daily.   Ice/cold pack over area for 10-15 min twice daily.  OK to take Tylenol 1000 mg (2 extra strength tabs) or 975 mg (3 regular strength tabs) every 6 hours as needed.  Let us know if you need anything.

## 2024-01-14 ENCOUNTER — Encounter: Payer: Self-pay | Admitting: Neurology

## 2024-01-14 ENCOUNTER — Ambulatory Visit: Admitting: Neurology

## 2024-01-14 VITALS — BP 168/77 | HR 80 | Ht 68.5 in | Wt 158.5 lb

## 2024-01-14 DIAGNOSIS — F4321 Adjustment disorder with depressed mood: Secondary | ICD-10-CM | POA: Diagnosis not present

## 2024-01-14 DIAGNOSIS — G3184 Mild cognitive impairment, so stated: Secondary | ICD-10-CM

## 2024-01-14 DIAGNOSIS — R296 Repeated falls: Secondary | ICD-10-CM

## 2024-01-14 MED ORDER — CITALOPRAM HYDROBROMIDE 10 MG PO TABS: 10.0000 mg | ORAL_TABLET | Freq: Every day | ORAL | 3 refills | Status: AC

## 2024-01-14 NOTE — Patient Instructions (Signed)
 Will prescribe citalopram citalopram 10 mg daily Referral to psychology for management of grief and adjustment disorder Will obtain dementia lab including B12, TSH, ATN profile with APO E4 genotype Refer to physical therapy for recurrent falls Follow-up in a year or sooner if worse    There are well-accepted and sensible ways to reduce risk for Alzheimers disease and other degenerative brain disorders .  Exercise Daily Walk A daily 20 minute walk should be part of your routine. Disease related apathy can be a significant roadblock to exercise and the only way to overcome this is to make it a daily routine and perhaps have a reward at the end (something your loved one loves to eat or drink perhaps) or a personal trainer coming to the home can also be very useful. Most importantly, the patient is much more likely to exercise if the caregiver / spouse does it with him/her. In general a structured, repetitive schedule is best.  General Health: Any diseases which effect your body will effect your brain such as a pneumonia, urinary infection, blood clot, heart attack or stroke. Keep contact with your primary care doctor for regular follow ups.  Sleep. A good nights sleep is healthy for the brain. Seven hours is recommended. If you have insomnia or poor sleep habits we can give you some instructions. If you have sleep apnea wear your mask.  Diet: Eating a heart healthy diet is also a good idea; fish and poultry instead of red meat, nuts (mostly non-peanuts), vegetables, fruits, olive oil or canola oil (instead of butter), minimal salt (use other spices to flavor foods), whole grain rice, bread, cereal and pasta and wine in moderation.Research is now showing that the MIND diet, which is a combination of The Mediterranean diet and the DASH diet, is beneficial for cognitive processing and longevity. Information about this diet can be found in The MIND Diet, a book by Annitta Feeling, MS, RDN, and online at  wildwildscience.es  Finances, Power of [address] and Advance Directives: You should consider putting legal safeguards in place with regard to financial and medical decision making. While the spouse always has power of attorney for medical and financial issues in the absence of any form, you should consider what you want in case the spouse / caregiver is no longer around or capable of making decisions.

## 2024-01-14 NOTE — Progress Notes (Signed)
 [facility]  PATIENT: Sandra Ray DOB: 1946/02/23  REQUESTING CLINICIAN: Nche, Roselie Rockford, NP HISTORY FROM: Patient and daughter REASON FOR VISIT: Memory loss   HISTORICAL  CHIEF COMPLAINT:  Chief Complaint  Patient presents with   New Patient (Initial Visit)    Pt in room 12. Rosina granddaughter in room. Internal referral for memory loss. MOCA:19    HISTORY OF PRESENT ILLNESS:  Discussed the use of AI scribe software for clinical note transcription with the patient, who gave verbal consent to proceed.  Sandra Ray is a 78 year old female with past medical history of hypertension, hyperlipidemia, diabetes, who presents with memory concerns and cognitive changes.  She has been experiencing memory issues that began before her husband's passing in July 2025, but have intensified since his death. Symptoms include forgetfulness, difficulty recalling words, and needing reminders for appointments. She sometimes gets confused about the time of day, leading to incidents like waking up early and mistakenly preparing breakfast. Her granddaughter notes variability in her memory and repetition of questions, with some days being better than others.  She has experienced two falls recently, one on wooden stairs without shoes and another in the driveway a few years ago, resulting in rib fractures. Her balance is sometimes off, and she has a history of back surgeries. She has not driven since 2023, as her husband used to drive, and she wants to resume driving once she regains access to her car.  Her sleep is generally good until daylight, which wakes her due to the house's many windows. She occasionally uses an over-the-counter sleep aid. No history of traumatic brain injury, stroke, seizures, or sleep apnea. She has not been diagnosed with depression or anxiety, nor has she been on medication for these conditions.  Her family history is significant for Alzheimer's disease,  with her grandmother, mother, and two maternal aunts affected. She has no siblings with Alzheimer's. Her grandson, who lives in [place] , manages her bills, a task her husband previously handled.      TBI:   No past history of TBI Stroke:   no past history of stroke Seizures:   no past history of seizures Sleep:   no history of sleep apnea.  Mood:  Never been diagnosed with anxiety and depression Family history of Dementia: Grandmother, Mother, Maternal Aunts with Alzheimer disease  Functional status: independent in all ADLs and IADLs Patient lives with granddaughter and [place] daughter's husband. Cooking: Sometimes Cleaning: No issues Shopping: No issues Bathing: No issues Toileting: No issues issues Driving: Not currently driving but denies any issue while driving.  She last drove around 2023 Bills: Husband used to handle bills but now is with nephew Medications: Patient Ever left the stove on by accident?:  Sometime Forget how to use items around the house?:  Denies Getting lost going to familiar places?:  Denies Forgetting loved ones names?: Denies  Word finding difficulty? Yes  Sleep: Good still daylight    OTHER MEDICAL CONDITIONS: Hypertension, hyperlipidemia, insomnia diabetes   REVIEW OF SYSTEMS: Full 14 system review of systems performed and negative with exception of: As noted in the HPI  ALLERGIES: Allergies  Allergen Reactions   Penicillins     Causes yeast infections Did it involve swelling of the face/tongue/throat, SOB, or low BP? No Did it involve sudden or severe rash/hives, skin peeling, or any reaction on the inside of your mouth or nose? No Did you need to seek medical attention at a hospital or doctor's  office? No When did it last happen?  2018     If all above answers are NO, may proceed with cephalosporin use.  Tolerated cefazolin  in 2020   Amoxicillin  Other (See Comments)    Pt states it causes her a yeast infection  Causes yeast infections Did  it involve swelling of the face/tongue/throat, SOB, or low BP? No Did it involve sudden or severe rash/hives, skin peeling, or any reaction on the inside of your mouth or nose? No Did you need to seek medical attention at a hospital or doctor's office? No When did it last happen?  2018     If all above answers are NO, may proceed with cephalosporin use.  Tolerated cefazolin  in 2020   Erythromycin     Passed out while taking    HOME MEDICATIONS: Outpatient Medications Prior to Visit  Medication Sig Dispense Refill   acetaminophen  (TYLENOL ) 650 MG CR tablet Take 1,300 mg by mouth 2 (two) times daily. (Patient taking differently: Take 1,300 mg by mouth every 8 (eight) hours as needed for pain.)     atorvastatin  (LIPITOR) 40 MG tablet Take 1 tablet (40 mg total) by mouth at bedtime. 90 tablet 3   Blood Glucose Monitoring Suppl DEVI 1 each by Does not apply route daily before breakfast. Dispense Accu Chek or Contour Plus Blue per Insurance coverage 1 each 0   calcium  carbonate (TUMS - DOSED IN MG ELEMENTAL CALCIUM ) 500 MG chewable tablet Chew 1-2 tablets by mouth daily as needed for indigestion or heartburn.     diphenhydramine-acetaminophen  (TYLENOL  PM) 25-500 MG TABS tablet Take 3 tablets by mouth at bedtime.     FARXIGA  10 MG TABS tablet Take 1 tablet (10 mg total) by mouth daily before breakfast. 90 tablet 30   fluticasone  (FLONASE ) 50 MCG/ACT nasal spray Place 2 sprays into both nostrils daily. (Patient taking differently: Place 2 sprays into both nostrils as needed.) 16 g 1   gabapentin  (NEURONTIN ) 300 MG capsule TAKE 1 CAPSULE BY MOUTH 3 TIMES  DAILY 300 capsule 1   Glucose Blood (BLOOD GLUCOSE TEST STRIPS) STRP Inject 1 each into the skin daily before breakfast. Dispense Accu Chek or Contour Plus Blue per Insurance coverage 100 strip 2   hydrochlorothiazide  (HYDRODIURIL ) 25 MG tablet TAKE 1 TABLET BY MOUTH DAILY 90 tablet 3   Lancet Device MISC 1 each by Does not apply route daily before  breakfast. Dispense Accu Chek or Contour Plus Blue per Insurance coverage 1 each 0   Lancets Misc. MISC 1 each by Does not apply route daily before breakfast. Dispense Accu Chek or Contour Plus Blue per Insurance coverage 100 each 2   lisinopril  (ZESTRIL ) 5 MG tablet TAKE 1 TABLET (5 MG TOTAL) BY  MOUTH DAILY. NEEDS TO MAINTAIN  APPOINTMENT WITH PCP ON  05/22/2023 FOR ADDITIONAL REFILLS 90 tablet 3   ondansetron  (ZOFRAN ) 4 MG tablet Take 1 tablet (4 mg total) by mouth every 8 (eight) hours as needed for nausea or vomiting. 30 tablet 5   tirzepatide  (MOUNJARO ) 7.5 MG/0.5ML Pen Inject 7.5 mg into the skin once a week. 6 mL 4   fenofibrate  (TRICOR ) 145 MG tablet TAKE 1 TABLET BY MOUTH DAILY (Patient not taking: Reported on 01/14/2024) 100 tablet 2   No facility-administered medications prior to visit.    PAST MEDICAL HISTORY: Past Medical History:  Diagnosis Date   Abdominal wall abscess, right upper quadrant 04/07/2013   Arthritis    Benign neoplasm of skin 03/28/2010  Qualifier: Diagnosis of   By: Josetta Corrigan      IMO SNOMED Dx Update Oct 2024     Breast abscess 2000   right breast   Carpal tunnel syndrome    Complication of anesthesia    Degenerative disc disease    Diabetes mellitus type II    Eczema of hand    Hx of colonic polyps 12/02/2014   2004 - 2 diminutive polyps    Hyperlipidemia    Hypertension    HYPOKALEMIA 06/10/2008   Qualifier: Diagnosis of   By: Mahlon MD, [place] Donna nail 12/08/2013   Metatarsal deformity    Metatarsalgia of right foot 12/08/2013   Muscle spasm 09/18/2011   Ovarian cyst 07/2017   left    Pain in lower limb 12/08/2013   PONV (postoperative nausea and vomiting)    Shingles 2000   Tubular adenoma of colon     PAST SURGICAL HISTORY: Past Surgical History:  Procedure Laterality Date   ABDOMINAL HYSTERECTOMY  1981   menorrhagia   ABSCESS DRAINAGE Right    breast   APPENDECTOMY  1966   done with CSxn. Prophylactic   BREAST  BIOPSY Left    carpel tunnel Right    CESAREAN SECTION     x2   COLONOSCOPY     LUMBAR LAMINECTOMY     [id number]. 2011 L4,L5,S1   Right rotator cuff repair 09/2015     ROBOTIC ASSISTED SALPINGO OOPHERECTOMY Left 04/16/2018   Procedure: XI ROBOTIC ASSISTED  LEFT SALPINGO OOPHORECTOMY;  Surgeon: Anitra Freddy NOVAK, MD;  Location: [facility];  Service: Gynecology;  Laterality: Left;   SPINAL CORD STIMULATOR INSERTION N/A 12/08/2021   Procedure: PLACEMENT OF SPINAL CORD STIMULATOR;  Surgeon: Burnetta Aures, MD;  Location: [facility];  Service: Orthopedics;  Laterality: N/A;  2.5 hrs 3 C-Bed   TUBAL LIGATION      FAMILY HISTORY: Family History  Problem Relation Age of Onset   Hypertension Father    Diabetes Mother    Stroke Mother    Hypertension Mother    High Cholesterol Mother    Heart disease Mother    Diabetes Brother    Breast cancer Daughter 56       unsure if did genetic testing   Breast cancer Maternal Aunt    Breast cancer Sister 43   Colon polyps Brother     SOCIAL HISTORY: Social History   Socioeconomic History   Marital status: Widowed    Spouse name: Not on file   Number of children: 2   Years of education: Not on file   Highest education level: Not on file  Occupational History   Not on file  Tobacco Use   Smoking status: Never   Smokeless tobacco: Never  Vaping Use   Vaping status: Never Used  Substance and Sexual Activity   Alcohol use: No   Drug use: No   Sexual activity: Not Currently  Other Topics Concern   Not on file  Social History Narrative   Married 2 daughters   Not working outside home   no caffeine   12/02/2014   Social Drivers of Health   Financial Resource Strain: Low Risk  (10/15/2023)   Overall Financial Resource Strain (CARDIA)    Difficulty of Paying Living Expenses: Not hard at all  Food Insecurity: No Food Insecurity (10/15/2023)   Hunger Vital Sign    Worried About Running Out of Food in the Last Year:  Never true    Ran Out of Food in  the Last Year: Never true  Transportation Needs: No Transportation Needs (10/15/2023)   PRAPARE - Administrator, Civil Service (Medical): No    Lack of Transportation (Non-Medical): No  Physical Activity: Inactive (10/15/2023)   Exercise Vital Sign    Days of Exercise per Week: 0 days    Minutes of Exercise per Session: 0 min  Stress: No Stress Concern Present (10/15/2023)   [employer] of Occupational Health - Occupational Stress Questionnaire    Feeling of Stress: Not at all  Social Connections: Moderately Isolated (10/15/2023)   Social Connection and Isolation Panel    Frequency of Communication with Friends and Family: More than three times a week    Frequency of Social Gatherings with Friends and Family: Not on file    Attends Religious Services: More than 4 times per year    Active Member of [employer] or Organizations: No    Attends Banker Meetings: Never    Marital Status: Widowed  Intimate Partner Violence: Not At Risk (10/15/2023)   Humiliation, Afraid, Rape, and Kick questionnaire    Fear of Current or Ex-Partner: No    Emotionally Abused: No    Physically Abused: No    Sexually Abused: No    PHYSICAL EXAM  GENERAL EXAM/CONSTITUTIONAL: Vitals:  Vitals:   01/14/24 1352  BP: (!) 168/77  Pulse: 80  Weight: 158 lb 8 oz (71.9 kg)  Height: 5' 8.5 (1.74 m)   Body mass index is 23.75 kg/m. Wt Readings from Last 3 Encounters:  01/14/24 158 lb 8 oz (71.9 kg)  01/02/24 161 lb 3.2 oz (73.1 kg)  11/22/23 167 lb (75.8 kg)   Patient is in no distress; well developed, nourished and groomed; neck is supple  MUSCULOSKELETAL: Gait, strength, tone, movements noted in Neurologic exam below  NEUROLOGIC: MENTAL STATUS:     09/28/2023    2:05 PM 03/28/2016    1:27 PM  MMSE - Mini Mental State Exam  Orientation to time 4 5   Orientation to Place 5 5   Registration 2 3   Attention/ Calculation 0 5   Recall 0 3   Language- name 2 objects 2 2   Language-  repeat 1 1  Language- follow 3 step command 3 3   Language- read & follow direction 1 1   Write a sentence 1 1   Copy design 1 1   Total score 20 30      Data saved with a previous flowsheet row definition      01/14/2024    1:55 PM  Montreal Cognitive Assessment   Visuospatial/ Executive (0/5) 2  Naming (0/3) 3  Attention: Read list of digits (0/2) 2  Attention: Read list of letters (0/1) 1  Attention: Serial 7 subtraction starting at 100 (0/3) 3  Language: Repeat phrase (0/2) 1  Language : Fluency (0/1) 0  Abstraction (0/2) 1  Delayed Recall (0/5) 0  Orientation (0/6) 6  Total 19    awake, alert, oriented to person, place and time Difficulty with remote history normal attention and concentration language fluent, comprehension intact, naming intact fund of knowledge appropriate  CRANIAL NERVE:  2nd, 3rd, 4th, 6th- visual fields full to confrontation, extraocular muscles intact, no nystagmus 5th - facial sensation symmetric 7th - facial strength symmetric 8th - hearing intact 9th - palate elevates symmetrically, uvula midline 11th - shoulder shrug symmetric 12th - tongue protrusion midline  MOTOR:  normal bulk and tone, full strength in the BUE, BLE  SENSORY:  normal and symmetric to light touch  COORDINATION:  finger-nose-finger, fine finger movements normal  GAIT/STATION:  Unsteady   DIAGNOSTIC DATA (LABS, IMAGING, TESTING) - I reviewed patient records, labs, notes, testing and imaging myself where available.  Lab Results  Component Value Date   WBC 19.3 (H) 02/07/2022   HGB 14.5 02/07/2022   HCT 43.5 02/07/2022   MCV 92.9 02/07/2022   PLT 324 02/07/2022      Component Value Date/Time   NA 136 11/22/2023 1031   K 3.8 11/22/2023 1031   CL 96 11/22/2023 1031   CO2 30 11/22/2023 1031   GLUCOSE 114 (H) 11/22/2023 1031   GLUCOSE 191 07/24/2007 0000   BUN 23 11/22/2023 1031   CREATININE 0.79 11/22/2023 1031   CREATININE 0.78 04/23/2023 1003    CALCIUM  10.3 11/22/2023 1031   PROT 7.6 11/22/2023 1031   ALBUMIN 4.5 11/22/2023 1031   AST 22 11/22/2023 1031   ALT 18 11/22/2023 1031   ALKPHOS 50 11/22/2023 1031   BILITOT 0.6 11/22/2023 1031   GFRNONAA >60 02/07/2022 1241   GFRAA >60 04/12/2018 1001   Lab Results  Component Value Date   CHOL 141 04/23/2023   HDL 44 (L) 04/23/2023   LDLCALC 70 04/23/2023   LDLDIRECT 84.0 11/22/2023   TRIG 194 (H) 04/23/2023   CHOLHDL 3.2 04/23/2023   Lab Results  Component Value Date   HGBA1C 6.5 (A) 11/06/2023   No results found for: VITAMINB12 Lab Results  Component Value Date   TSH 1.69 02/02/2020    MRI Brain 01/14/2024 1. No evidence of an acute intracranial abnormality. 2. Mild chronic small vessel ischemic changes within the cerebral white matter. 3. Mild-to-moderate generalized cerebral atrophy.    ASSESSMENT AND PLAN  78 y.o. year old female with    Mild cognitive impairment Memory loss affecting daily activities, including forgetfulness, difficulty with word retrieval, and repetitive speech. Family history of Alzheimer's disease. Differential diagnosis includes mild cognitive impairment due to depression or grief versus mild cognitive impairment due to a neurodegenerative process. MRI from August shows age-related changes and generalized atrophy, no acute abnormalities. - Ordered dementia workup labs to check for biomarkers of dementia - Referred to psychologist for grief therapy - Prescribed low dose escitalopram for mood and motivation  Repeated falls Two recent falls, one resulting in a slide down wooden stairs and another in the driveway. Previous fall with rib fractures. Balance issues and tripping noted. Previous fall is a significant predictor of future falls. 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 No prior diagnosis of depression or anxiety. Grief may contribute to mild cognitive impairment. - Referred to psychologist for grief therapy - Prescribed low dose escitalopram for mood and motivation     1. Mild cognitive impairment   2. Grief   3. Recurrent falls      Patient Instructions  Will prescribe citalopram citalopram 10 mg daily Referral to psychology for management of grief and adjustment disorder Will obtain dementia lab including B12, TSH, ATN profile with APO E4 genotype Refer to physical therapy for recurrent falls Follow-up in a year or sooner if worse    There are well-accepted and sensible ways to reduce risk for Alzheimers disease and other degenerative brain disorders .  Exercise Daily Walk A daily 20 minute walk should be part of your routine. Disease related apathy can be a significant roadblock to exercise and the only way to overcome this is to make it a daily routine and perhaps have a reward at the end (something your loved one loves to eat or drink perhaps) or a personal trainer coming to the home can also be very useful. Most importantly, the patient is much more likely to exercise if the caregiver / spouse does it with him/her. In general a structured, repetitive schedule is best.  General Health: Any diseases which effect your body will effect your brain such as a pneumonia, urinary infection, blood clot, heart attack or stroke. Keep contact with your primary care doctor for regular follow ups.  Sleep. A good nights sleep is healthy for the brain. Seven hours is recommended. If you have insomnia or poor sleep habits we can give you some instructions. If you have sleep apnea wear your mask.  Diet: Eating a heart healthy diet is also a good idea; fish and poultry instead of red meat, nuts (mostly non-peanuts), vegetables, fruits, olive oil or canola oil (instead of butter), minimal salt (use other spices to flavor foods),  whole grain rice, bread, cereal and pasta and wine in moderation.Research is now showing that the MIND diet, which is a combination of The Mediterranean diet and the DASH diet, is beneficial for cognitive processing and longevity. Information about this diet can be found in The MIND Diet, a book by Annitta Feeling, MS, RDN, and online at wildwildscience.es  Finances, Power of [address] and Advance Directives: You should consider putting legal safeguards in place with regard to financial and medical decision making. While the spouse always has power of attorney for medical and financial issues in the absence of any form, you should consider what you want in case the spouse / caregiver is no longer around or capable of making decisions.   Orders Placed This Encounter  Procedures   TSH   Vitamin B12   ATN PROFILE   APOE Alzheimer's Risk   Ambulatory referral to Psychology   Ambulatory referral to Physical Therapy    Meds ordered this encounter  Medications   citalopram (CELEXA) 10 MG tablet    Sig: Take 1 tablet (10 mg total) by mouth daily.    Dispense:  30 tablet    Refill:  3    Return in about 1 year (around 01/13/2025).  I personally spent a total of 65 minutes in the care of the patient today including preparing to see the patient, getting/reviewing separately obtained history, performing a medically appropriate exam/evaluation, counseling and educating, placing orders, referring and communicating with other health care professionals, and documenting clinical information in the EHR.   Pastor Falling, MD 01/14/2024, 3:33 PM  [facility] [address], [address] [place], [place] [address] [phone]

## 2024-01-15 ENCOUNTER — Telehealth: Payer: Self-pay | Admitting: Neurology

## 2024-01-15 NOTE — Telephone Encounter (Signed)
 Referral to Physical therapy sent thru epic to [facility]  [facility]  Phone [phone]

## 2024-01-16 ENCOUNTER — Telehealth: Payer: Self-pay | Admitting: Neurology

## 2024-01-16 NOTE — Telephone Encounter (Signed)
Referral for psychology fax to [facility]. Phone: [phone], Fax: [phone].

## 2024-01-29 ENCOUNTER — Ambulatory Visit: Payer: Self-pay | Admitting: Endocrinology

## 2024-01-29 ENCOUNTER — Encounter: Payer: Self-pay | Admitting: Endocrinology

## 2024-01-29 ENCOUNTER — Ambulatory Visit: Admitting: Endocrinology

## 2024-01-29 VITALS — BP 146/68 | HR 70 | Resp 97 | Ht 68.0 in | Wt 157.0 lb

## 2024-01-29 DIAGNOSIS — Z7984 Long term (current) use of oral hypoglycemic drugs: Secondary | ICD-10-CM

## 2024-01-29 DIAGNOSIS — E118 Type 2 diabetes mellitus with unspecified complications: Secondary | ICD-10-CM | POA: Diagnosis not present

## 2024-01-29 DIAGNOSIS — Z7985 Long-term (current) use of injectable non-insulin antidiabetic drugs: Secondary | ICD-10-CM

## 2024-01-29 LAB — POCT GLYCOSYLATED HEMOGLOBIN (HGB A1C): Hemoglobin A1C: 7 % — AB (ref 4.0–5.6)

## 2024-01-29 NOTE — Progress Notes (Signed)
 Outpatient Endocrinology Note Maryan Sivak, MD  01/29/24  Patient's Name: Sandra Ray    DOB: 03-Apr-1945    MRN: [id number]                                                    REASON OF VISIT: Follow up of type 2 diabetes mellitus  PCP: Katheen Roselie Rockford, NP  HISTORY OF PRESENT ILLNESS:   Sandra Ray is a 78 y.o. old female with past medical history listed below, is here for follow up for type 2 diabetes mellitus.    Pertinent Diabetes History: Patient was diagnosed with type 2 diabetes mellitus in 1996.  Chronic Diabetes Complications : Retinopathy: no. Last ophthalmology exam was done on annually, 08/2023, following with ophthalmology regularly.  Nephropathy: CKD III, on ACE/ARB / lisinopril .  Peripheral neuropathy: yes, on gabapentin  300 mg 3 times a day from PCP.  Had back surgery in September 2023. Coronary artery disease: no Stroke: no  Relevant comorbidities and cardiovascular risk factors: Obesity: no Body mass index is 23.87 kg/m.  Hypertension: Yes  Hyperlipidemia : Yes, on statin   Current / Home Diabetic regimen includes:  Mounjaro  7.5 mg weekly. Farxiga  10 mg daily.  Prior diabetic medications: She had taken glipizide  and Actos in the past.  Actos was stopped due to significant weight gain.  She had taken Januvia , Tradjenta  and metformin  in the past.  Nausea with high dose of metformin .  Metformin  is stopped due to diarrhea.  Glycemic data:   One Touch Verio reflect glucometer.  Reviewed from October 20 to January 29, 2024 average blood sugar 166.  She has been checking blood sugar mostly in the morning fasting some of the blood sugar 147, 167, 147, 156, 145, 171, 118, 169, 155.  Mild hyperglycemia. hyperglycemia.  No hypoglycemia.  Hypoglycemia: Patient has no hypoglycemic episodes. Patient has hypoglycemia awareness.  Factors modifying glucose control: 1.  Diabetic diet assessment: 3 meals a day.  Eating more of the bedside table.  2.  Staying active  or exercising: No formal exercise.  3.  Medication compliance: compliant all of the time.  Interval history  Patient has been taking Mounjaro  7.5 mg weekly and Farxiga .  No longer taking glimepiride .  Glucometer data as reviewed above.  Mostly acceptable blood sugar with occasional mild hyperglycemia.  She has been tolerating Mounjaro  well.  Reports appetite is fair.  She lost few pounds of weight from the last visit.  She is accompanied by her granddaughter in the clinic today.  No other complaints.  REVIEW OF SYSTEMS As per history of present illness.   PAST MEDICAL HISTORY: Past Medical History:  Diagnosis Date   Abdominal wall abscess, right upper quadrant 04/07/2013   Arthritis    Benign neoplasm of skin 03/28/2010   Qualifier: Diagnosis of   By: Josetta Corrigan      IMO SNOMED Dx Update Oct 2024     Breast abscess 2000   right breast   Carpal tunnel syndrome    Complication of anesthesia    Degenerative disc disease    Diabetes mellitus type II    Eczema of hand    Hx of colonic polyps 12/02/2014   2004 - 2 diminutive polyps    Hyperlipidemia    Hypertension    HYPOKALEMIA 06/10/2008   Qualifier: Diagnosis of  By: Mahlon MD, [place] Donna nail 12/08/2013   Metatarsal deformity    Metatarsalgia of right foot 12/08/2013   Muscle spasm 09/18/2011   Ovarian cyst 07/2017   left    Pain in lower limb 12/08/2013   PONV (postoperative nausea and vomiting)    Shingles 2000   Tubular adenoma of colon     PAST SURGICAL HISTORY: Past Surgical History:  Procedure Laterality Date   ABDOMINAL HYSTERECTOMY  1981   menorrhagia   ABSCESS DRAINAGE Right    breast   APPENDECTOMY  1966   done with CSxn. Prophylactic   BREAST BIOPSY Left    carpel tunnel Right    CESAREAN SECTION     x2   COLONOSCOPY     LUMBAR LAMINECTOMY     [id number]. 2011 L4,L5,S1   Right rotator cuff repair 09/2015     ROBOTIC ASSISTED SALPINGO OOPHERECTOMY Left 04/16/2018   Procedure: XI  ROBOTIC ASSISTED  LEFT SALPINGO OOPHORECTOMY;  Surgeon: Anitra Freddy NOVAK, MD;  Location: [facility];  Service: Gynecology;  Laterality: Left;   SPINAL CORD STIMULATOR INSERTION N/A 12/08/2021   Procedure: PLACEMENT OF SPINAL CORD STIMULATOR;  Surgeon: Burnetta Aures, MD;  Location: [facility];  Service: Orthopedics;  Laterality: N/A;  2.5 hrs 3 C-Bed   TUBAL LIGATION      ALLERGIES: Allergies  Allergen Reactions   Penicillins     Causes yeast infections Did it involve swelling of the face/tongue/throat, SOB, or low BP? No Did it involve sudden or severe rash/hives, skin peeling, or any reaction on the inside of your mouth or nose? No Did you need to seek medical attention at a hospital or doctor's office? No When did it last happen?  2018     If all above answers are NO, may proceed with cephalosporin use.  Tolerated cefazolin  in 2020   Amoxicillin  Other (See Comments)    Pt states it causes her a yeast infection  Causes yeast infections Did it involve swelling of the face/tongue/throat, SOB, or low BP? No Did it involve sudden or severe rash/hives, skin peeling, or any reaction on the inside of your mouth or nose? No Did you need to seek medical attention at a hospital or doctor's office? No When did it last happen?  2018     If all above answers are NO, may proceed with cephalosporin use.  Tolerated cefazolin  in 2020   Erythromycin     Passed out while taking    FAMILY HISTORY:  Family History  Problem Relation Age of Onset   Hypertension Father    Diabetes Mother    Stroke Mother    Hypertension Mother    High Cholesterol Mother    Heart disease Mother    Diabetes Brother    Breast cancer Daughter 53       unsure if did genetic testing   Breast cancer Maternal Aunt    Breast cancer Sister 19   Colon polyps Brother     SOCIAL HISTORY: Social History   Socioeconomic History   Marital status: Widowed    Spouse name: Not on file   Number of children: 2   Years of  education: Not on file   Highest education level: Not on file  Occupational History   Not on file  Tobacco Use   Smoking status: Never   Smokeless tobacco: Never  Vaping Use   Vaping status: Never Used  Substance and Sexual Activity  Alcohol use: No   Drug use: No   Sexual activity: Not Currently  Other Topics Concern   Not on file  Social History Narrative   Married 2 daughters   Not working outside home   no caffeine   12/02/2014   Social Drivers of Health   Financial Resource Strain: Low Risk  (10/15/2023)   Overall Financial Resource Strain (CARDIA)    Difficulty of Paying Living Expenses: Not hard at all  Food Insecurity: No Food Insecurity (10/15/2023)   Hunger Vital Sign    Worried About Running Out of Food in the Last Year: Never true    Ran Out of Food in the Last Year: Never true  Transportation Needs: No Transportation Needs (10/15/2023)   PRAPARE - Administrator, Civil Service (Medical): No    Lack of Transportation (Non-Medical): No  Physical Activity: Inactive (10/15/2023)   Exercise Vital Sign    Days of Exercise per Week: 0 days    Minutes of Exercise per Session: 0 min  Stress: No Stress Concern Present (10/15/2023)   [employer] of Occupational Health - Occupational Stress Questionnaire    Feeling of Stress: Not at all  Social Connections: Moderately Isolated (10/15/2023)   Social Connection and Isolation Panel    Frequency of Communication with Friends and Family: More than three times a week    Frequency of Social Gatherings with Friends and Family: Not on file    Attends Religious Services: More than 4 times per year    Active Member of [employer] or Organizations: No    Attends Banker Meetings: Never    Marital Status: Widowed    MEDICATIONS:  Current Outpatient Medications  Medication Sig Dispense Refill   acetaminophen  (TYLENOL ) 650 MG CR tablet Take 1,300 mg by mouth 2 (two) times daily. (Patient taking differently: Take  1,300 mg by mouth every 8 (eight) hours as needed for pain.)     atorvastatin  (LIPITOR) 40 MG tablet Take 1 tablet (40 mg total) by mouth at bedtime. 90 tablet 3   Blood Glucose Monitoring Suppl DEVI 1 each by Does not apply route daily before breakfast. Dispense Accu Chek or Contour Plus Blue per Insurance coverage 1 each 0   calcium  carbonate (TUMS - DOSED IN MG ELEMENTAL CALCIUM ) 500 MG chewable tablet Chew 1-2 tablets by mouth daily as needed for indigestion or heartburn.     citalopram (CELEXA) 10 MG tablet Take 1 tablet (10 mg total) by mouth daily. 30 tablet 3   diphenhydramine-acetaminophen  (TYLENOL  PM) 25-500 MG TABS tablet Take 3 tablets by mouth at bedtime.     FARXIGA  10 MG TABS tablet Take 1 tablet (10 mg total) by mouth daily before breakfast. 90 tablet 30   fluticasone  (FLONASE ) 50 MCG/ACT nasal spray Place 2 sprays into both nostrils daily. (Patient taking differently: Place 2 sprays into both nostrils as needed.) 16 g 1   gabapentin  (NEURONTIN ) 300 MG capsule TAKE 1 CAPSULE BY MOUTH 3 TIMES  DAILY 300 capsule 1   Glucose Blood (BLOOD GLUCOSE TEST STRIPS) STRP Inject 1 each into the skin daily before breakfast. Dispense Accu Chek or Contour Plus Blue per Insurance coverage 100 strip 2   hydrochlorothiazide  (HYDRODIURIL ) 25 MG tablet TAKE 1 TABLET BY MOUTH DAILY 90 tablet 3   Lancet Device MISC 1 each by Does not apply route daily before breakfast. Dispense Accu Chek or Contour Plus Blue per Insurance coverage 1 each 0   Lancets Misc. MISC 1 each  by Does not apply route daily before breakfast. Dispense Accu Chek or Contour Plus Blue per Insurance coverage 100 each 2   lisinopril  (ZESTRIL ) 5 MG tablet TAKE 1 TABLET (5 MG TOTAL) BY  MOUTH DAILY. NEEDS TO MAINTAIN  APPOINTMENT WITH PCP ON  05/22/2023 FOR ADDITIONAL REFILLS 90 tablet 3   ondansetron  (ZOFRAN ) 4 MG tablet Take 1 tablet (4 mg total) by mouth every 8 (eight) hours as needed for nausea or vomiting. 30 tablet 5   tirzepatide   (MOUNJARO ) 7.5 MG/0.5ML Pen Inject 7.5 mg into the skin once a week. 6 mL 4   fenofibrate  (TRICOR ) 145 MG tablet TAKE 1 TABLET BY MOUTH DAILY (Patient not taking: Reported on 01/29/2024) 100 tablet 2   No current facility-administered medications for this visit.    PHYSICAL EXAM: Vitals:   01/29/24 1531 01/29/24 1534  BP: (!) 148/60 (!) 146/68  Pulse: 70   Resp: (!) 97   Weight: 157 lb (71.2 kg)   Height: 5' 8 (1.727 m)      Body mass index is 23.87 kg/m.  Wt Readings from Last 3 Encounters:  01/29/24 157 lb (71.2 kg)  01/14/24 158 lb 8 oz (71.9 kg)  01/02/24 161 lb 3.2 oz (73.1 kg)    General: Well developed, well nourished female in no apparent distress.  HEENT: AT/[place], no external lesions.  Eyes: Conjunctiva clear and no icterus. Neck: Neck supple  Lungs: Respirations not labored Neurologic: Alert, oriented, normal speech Extremities / Skin: Dry.  Psychiatric: Does not appear depressed or anxious  Diabetic Foot Exam - Simple   No data filed    LABS Reviewed Lab Results  Component Value Date   HGBA1C 7.0 (A) 01/29/2024   HGBA1C 6.5 (A) 11/06/2023   HGBA1C 6.6 (A) 08/02/2023   Lab Results  Component Value Date   FRUCTOSAMINE 237 03/28/2019   FRUCTOSAMINE 263 05/14/2018   FRUCTOSAMINE 328 (H) 07/07/2016   Lab Results  Component Value Date   CHOL 141 04/23/2023   HDL 44 (L) 04/23/2023   LDLCALC 70 04/23/2023   LDLDIRECT 84.0 11/22/2023   TRIG 194 (H) 04/23/2023   CHOLHDL 3.2 04/23/2023   Lab Results  Component Value Date   MICRALBCREAT 8 04/23/2023   MICRALBCREAT 0.5 03/28/2010   Lab Results  Component Value Date   CREATININE 0.79 11/22/2023   Lab Results  Component Value Date   GFR 71.70 11/22/2023    ASSESSMENT / PLAN  1. Controlled type 2 diabetes mellitus with complication, without long-term current use of insulin  (HCC)     Diabetes Mellitus type 2, complicated by CKD /neuropathy. - Diabetic status / severity: Fair controlled.  Lab  Results  Component Value Date   HGBA1C 7.0 (A) 01/29/2024    - Hemoglobin A1c goal : <7%  Glucometer data mostly acceptable with occasional mild hyperglycemia fasting.   - Medications: see below.  No change.  Continue Farxiga  10 mg daily. Continue Mounjaro  7.5 mg weekly.    Consider increasing Mounjaro  if diabetes control worsening.  - Home glucose testing: In the morning fasting daily and at bedtime few times a week.  - Discussed/ Gave Hypoglycemia treatment plan.  # Consult : not required at this time.   # Annual urine for microalbuminuria/ creatinine ratio, no microalbuminuria currently, continue ACE/ARB /lisinopril . Last  Lab Results  Component Value Date   MICRALBCREAT 8 04/23/2023    # Foot check nightly / neuropathy, continue gabapentin , managed by PCP.  # Annual dilated diabetic eye exams.   -  Diet: Make healthy diabetic food choices - Life style / activity / exercise: Discussed.  2. Blood pressure  -  BP Readings from Last 1 Encounters:  01/29/24 (!) 146/68    - Control is in target.  - No change in current plans.  3. Lipid status / Hyperlipidemia - Last  Lab Results  Component Value Date   LDLCALC 70 04/23/2023   - Continue atorvastatin  40 mg daily.  Managed by primary care provider.  Journi was seen today for follow-up.  Diagnoses and all orders for this visit:  Controlled type 2 diabetes mellitus with complication, without long-term current use of insulin  (HCC) -     POCT glycosylated hemoglobin (Hb A1C)     DISPOSITION Follow up in clinic in 3 months suggested.  Labs on the same day of the visit.   All questions answered and patient verbalized understanding of the plan.  Geronimo Diliberto, MD [facility] [address] [place], [address] [place], [place] [address] Phone # [phone]  At least part of this note was generated using voice recognition software. Inadvertent word errors may have occurred, which were  not recognized during the proofreading process.

## 2024-01-30 ENCOUNTER — Ambulatory Visit: Payer: Self-pay | Admitting: Neurology

## 2024-01-30 LAB — APOE ALZHEIMER'S DISEASE RISK

## 2024-01-30 LAB — ATN PROFILE
A -- Beta-amyloid 42/40 Ratio: 0.103 (ref >0.102)
Beta-amyloid 40: 261.4 pg/mL
Beta-amyloid 42: 27.05 pg/mL
N -- NfL, Plasma: 3.58 pg/mL (ref 0.00–6.04)
T -- p-tau181: 2.33 pg/mL — ABNORMAL HIGH (ref 0.00–0.97)

## 2024-01-30 LAB — VITAMIN B12: Vitamin B-12: 1195 pg/mL (ref 232–1245)

## 2024-01-30 LAB — TSH: TSH: 1.07 u[IU]/mL (ref 0.450–4.500)

## 2024-02-11 ENCOUNTER — Telehealth: Payer: Self-pay

## 2024-02-11 NOTE — Telephone Encounter (Unsigned)
 Copied from CRM #[id number]. Topic: Clinical - Lab/Test Results >> Feb 11, 2024 12:58 PM Sandra Ray wrote: Reason for CRM: Sandra Ray, House calls NP with [employer], called to report abnormal findings. She said pt's exam revealed that LLE is cooler to touch than the right. Bilateral pedal pulses were diminished, and monofilament and vibratory testing impaired bilaterally.

## 2024-02-12 NOTE — Telephone Encounter (Signed)
 Called patient and got her scheduled for an appointment with Dr. Sebastian on 02/13/04 at 10:00 AM.

## 2024-02-13 ENCOUNTER — Encounter: Payer: Self-pay | Admitting: Family Medicine

## 2024-02-13 ENCOUNTER — Ambulatory Visit: Admitting: Family Medicine

## 2024-02-13 VITALS — BP 140/68 | HR 71 | Temp 98.0°F | Ht 68.0 in | Wt 153.8 lb

## 2024-02-13 DIAGNOSIS — E1140 Type 2 diabetes mellitus with diabetic neuropathy, unspecified: Secondary | ICD-10-CM

## 2024-02-13 DIAGNOSIS — R209 Unspecified disturbances of skin sensation: Secondary | ICD-10-CM | POA: Diagnosis not present

## 2024-02-13 DIAGNOSIS — E785 Hyperlipidemia, unspecified: Secondary | ICD-10-CM

## 2024-02-13 DIAGNOSIS — M5441 Lumbago with sciatica, right side: Secondary | ICD-10-CM

## 2024-02-13 DIAGNOSIS — E1169 Type 2 diabetes mellitus with other specified complication: Secondary | ICD-10-CM | POA: Diagnosis not present

## 2024-02-13 DIAGNOSIS — E1151 Type 2 diabetes mellitus with diabetic peripheral angiopathy without gangrene: Secondary | ICD-10-CM

## 2024-02-13 DIAGNOSIS — G8929 Other chronic pain: Secondary | ICD-10-CM

## 2024-02-13 DIAGNOSIS — F02A0 Dementia in other diseases classified elsewhere, mild, without behavioral disturbance, psychotic disturbance, mood disturbance, and anxiety: Secondary | ICD-10-CM

## 2024-02-13 NOTE — Progress Notes (Signed)
 Assessment & Plan   Assessment/Plan:   Assessment and Plan Assessment & Plan Cold Extremity Reports right leg warmer and left leg cooler. Examination shows no significant temperature difference, skin intact, no redness, infection, swelling, or skin breakdown. Differential includes neuropathic dysfunction, vascular dysfunction, lower extremity infection, or venous thrombosis. Neuropathy and diabetes-related blood flow changes considered. No evidence of DVT or acute infection. - Continue to monitor for pain, swelling, redness, skin breakdown, chest pain, or shortness of breath.  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 Neuropathy present with impaired monofilament and vibratory testing bilaterally. Neuropathic symptoms managed with gabapentin . - Continue Farxiga  10 mg daily. - Continue gabapentin  300 mg TID.  Peripheral arterial disease of lower extremities with diabetes Peripheral arterial disease secondary to diabetes. No acute changes or vascular studies available. - Continue current management and monitor for changes.  Dementia Possible dementia with ATN profile and ApoE markers. B12 and TSH levels normal. Follows with neurology for memory management. - Continue follow-up with neurology.  Essential hypertension Blood pressure mildly elevated at 140/68. Managed with lisinopril  and hydrochlorothiazide . - Continue lisinopril  5 mg daily. - Continue hydrochlorothiazide  25 mg daily.  Hyperlipidemia Managed with atorvastatin . - Continue atorvastatin  40 mg daily.  Chronic low back pain with sciatica, right side Chronic low back pain with sciatica managed with a stimulator in place for two years. No current pain reported. - Continue current management.        There are no discontinued medications.  Return if symptoms worsen or fail to improve.        Subjective:   Encounter date:  02/13/2024  Sandra Ray is a 78 y.o. female who has DM neuropathy, type II diabetes mellitus (HCC); AUTONOMIC NEUROPATHY, DIABETIC; Essential hypertension, benign; MENOPAUSE, SURGICAL; DEGENERATIVE JOINT DISEASE, SPINE; LAMINECTOMY, LUMBAR, HX OF; Carpal tunnel syndrome; DNR (do not resuscitate) discussion; Hand eczema; S/P breast biopsy, left; History of herpes zoster; Pain in finger of right hand; Equinus deformity of foot, acquired; Metatarsal deformity; Hammer toe of right foot; Personal history of failed moderate sedation; Hx of colonic polyps; Intestinal adhesions; Hyperlipidemia associated with type 2 diabetes mellitus (HCC); Left ovarian cyst; Pain of left hip joint; Asymptomatic age-related postmenopausal state; Mixed stress and urge urinary incontinence; Type II diabetes mellitus with peripheral artery disease (HCC); Chronic bilateral low back pain with right-sided sciatica; Chronic idiopathic constipation; Chronic pain; Dyspepsia; Diabetes mellitus type II, controlled (HCC); and Memory deficit on their problem list..   She  has a past medical history of Abdominal wall abscess, right upper quadrant (04/07/2013), Arthritis, Benign neoplasm of skin (03/28/2010), Breast abscess (2000), Carpal tunnel syndrome, Complication of anesthesia, Degenerative disc disease, Diabetes mellitus type II, Eczema of hand, colonic polyps (12/02/2014), Hyperlipidemia, Hypertension, HYPOKALEMIA (06/10/2008), Ingrown nail (12/08/2013), Metatarsal deformity, Metatarsalgia of right foot (12/08/2013), Muscle spasm (09/18/2011), Ovarian cyst (07/2017), Pain in lower limb (12/08/2013), PONV (postoperative nausea and vomiting), Shingles (2000), and Tubular adenoma of colon..   She presents with chief complaint of Leg Pain (Right leg has warm feeling, left leg ok some off balance ) .  Discussed the use of AI scribe software for clinical note transcription with the patient, who gave verbal consent to proceed.  History of  Present Illness Sandra Ray is a 78 year old female with autonomic diabetic neuropathy and peripheral arterial disease who presents with an abnormal finding of her right leg being warmer than usual. She is accompanied by her granddaughter, Rosina.  Abnormal  right lower extremity temperature - Right leg noted to be warmer than usual by home health nurse two days ago - Unclear if right leg is warmer or left leg is cooler - No recent vascular study available  Peripheral neuropathy and balance disturbance - History of autonomic diabetic neuropathy - Neuropathic symptoms managed with gabapentin  300 mg three times daily - Sensation of being slightly off balance  Peripheral arterial disease - History of peripheral arterial disease  Diabetes mellitus - Diabetes well-controlled with most recent A1c of 7.0 - Managed with Farxiga  10 mg daily and Trulicity  7.5 mg weekly - Follows with endocrinology for diabetes management  Chronic low back pain with sciatica - Chronic lower back pain with associated sciatica  Hypertension - Blood pressure mildly elevated at 140/68 today - Managed with lisinopril  5 mg daily and hydrochlorothiazide  25 mg daily  Hyperlipidemia - Managed with atorvastatin  40 mg daily  Dementia and cognitive impairment - History of dementia with ATN profile and ApoE markers for dementia - B12 and TSH levels normal - Follows with neurology for memory issues  Cardiopulmonary symptoms - No chest pain or shortness of breath   ROS  Past Surgical History:  Procedure Laterality Date   ABDOMINAL HYSTERECTOMY  1981   menorrhagia   ABSCESS DRAINAGE Right    breast   APPENDECTOMY  1966   done with CSxn. Prophylactic   BREAST BIOPSY Left    carpel tunnel Right    CESAREAN SECTION     x2   COLONOSCOPY     LUMBAR LAMINECTOMY     [id number]. 2011 L4,L5,S1   Right rotator cuff repair 09/2015     ROBOTIC ASSISTED SALPINGO OOPHERECTOMY Left 04/16/2018   Procedure: XI ROBOTIC  ASSISTED  LEFT SALPINGO OOPHORECTOMY;  Surgeon: Anitra Freddy NOVAK, MD;  Location: [facility];  Service: Gynecology;  Laterality: Left;   SPINAL CORD STIMULATOR INSERTION N/A 12/08/2021   Procedure: PLACEMENT OF SPINAL CORD STIMULATOR;  Surgeon: Burnetta Aures, MD;  Location: [facility];  Service: Orthopedics;  Laterality: N/A;  2.5 hrs 3 C-Bed   TUBAL LIGATION      Current Outpatient Medications on File Prior to Visit  Medication Sig Dispense Refill   acetaminophen  (TYLENOL ) 650 MG CR tablet Take 1,300 mg by mouth 2 (two) times daily. (Patient taking differently: Take 1,300 mg by mouth every 8 (eight) hours as needed for pain.)     atorvastatin  (LIPITOR) 40 MG tablet Take 1 tablet (40 mg total) by mouth at bedtime. 90 tablet 3   Blood Glucose Monitoring Suppl DEVI 1 each by Does not apply route daily before breakfast. Dispense Accu Chek or Contour Plus Blue per Insurance coverage 1 each 0   calcium  carbonate (TUMS - DOSED IN MG ELEMENTAL CALCIUM ) 500 MG chewable tablet Chew 1-2 tablets by mouth daily as needed for indigestion or heartburn.     citalopram  (CELEXA ) 10 MG tablet Take 1 tablet (10 mg total) by mouth daily. 30 tablet 3   FARXIGA  10 MG TABS tablet Take 1 tablet (10 mg total) by mouth daily before breakfast. 90 tablet 30   fluticasone  (FLONASE ) 50 MCG/ACT nasal spray Place 2 sprays into both nostrils daily. (Patient taking differently: Place 2 sprays into both nostrils as needed.) 16 g 1   gabapentin  (NEURONTIN ) 300 MG capsule TAKE 1 CAPSULE BY MOUTH 3 TIMES  DAILY 300 capsule 1   Glucose Blood (BLOOD GLUCOSE TEST STRIPS) STRP Inject 1 each into the skin daily before breakfast. Dispense Accu Chek or Contour  Plus Blue per Insurance coverage 100 strip 2   hydrochlorothiazide  (HYDRODIURIL ) 25 MG tablet TAKE 1 TABLET BY MOUTH DAILY 90 tablet 3   Lancet Device MISC 1 each by Does not apply route daily before breakfast. Dispense Accu Chek or Contour Plus Blue per Insurance coverage 1 each 0   Lancets  Misc. MISC 1 each by Does not apply route daily before breakfast. Dispense Accu Chek or Contour Plus Blue per Insurance coverage 100 each 2   lisinopril  (ZESTRIL ) 5 MG tablet TAKE 1 TABLET (5 MG TOTAL) BY  MOUTH DAILY. NEEDS TO MAINTAIN  APPOINTMENT WITH PCP ON  05/22/2023 FOR ADDITIONAL REFILLS 90 tablet 3   ondansetron  (ZOFRAN ) 4 MG tablet Take 1 tablet (4 mg total) by mouth every 8 (eight) hours as needed for nausea or vomiting. 30 tablet 5   tirzepatide  (MOUNJARO ) 7.5 MG/0.5ML Pen Inject 7.5 mg into the skin once a week. 6 mL 4   diphenhydramine-acetaminophen  (TYLENOL  PM) 25-500 MG TABS tablet Take 3 tablets by mouth at bedtime. (Patient not taking: Reported on 02/13/2024)     fenofibrate  (TRICOR ) 145 MG tablet TAKE 1 TABLET BY MOUTH DAILY (Patient not taking: Reported on 01/29/2024) 100 tablet 2   No current facility-administered medications on file prior to visit.    Family History  Problem Relation Age of Onset   Hypertension Father    Diabetes Mother    Stroke Mother    Hypertension Mother    High Cholesterol Mother    Heart disease Mother    Diabetes Brother    Breast cancer Daughter 66       unsure if did genetic testing   Breast cancer Maternal Aunt    Breast cancer Sister 20   Colon polyps Brother     Social History   Socioeconomic History   Marital status: Widowed    Spouse name: Not on file   Number of children: 2   Years of education: Not on file   Highest education level: Not on file  Occupational History   Not on file  Tobacco Use   Smoking status: Never   Smokeless tobacco: Never  Vaping Use   Vaping status: Never Used  Substance and Sexual Activity   Alcohol use: No   Drug use: No   Sexual activity: Not Currently  Other Topics Concern   Not on file  Social History Narrative   Married 2 daughters   Not working outside home   no caffeine   12/02/2014   Social Drivers of Health   Financial Resource Strain: Low Risk  (10/15/2023)   Overall Financial  Resource Strain (CARDIA)    Difficulty of Paying Living Expenses: Not hard at all  Food Insecurity: No Food Insecurity (10/15/2023)   Hunger Vital Sign    Worried About Running Out of Food in the Last Year: Never true    Ran Out of Food in the Last Year: Never true  Transportation Needs: No Transportation Needs (10/15/2023)   PRAPARE - Administrator, Civil Service (Medical): No    Lack of Transportation (Non-Medical): No  Physical Activity: Inactive (10/15/2023)   Exercise Vital Sign    Days of Exercise per Week: 0 days    Minutes of Exercise per Session: 0 min  Stress: No Stress Concern Present (10/15/2023)   [employer] of Occupational Health - Occupational Stress Questionnaire    Feeling of Stress: Not at all  Social Connections: Moderately Isolated (10/15/2023)   Social Connection and Isolation  Panel    Frequency of Communication with Friends and Family: More than three times a week    Frequency of Social Gatherings with Friends and Family: Not on file    Attends Religious Services: More than 4 times per year    Active Member of [employer] or Organizations: No    Attends Banker Meetings: Never    Marital Status: Widowed  Intimate Partner Violence: Not At Risk (10/15/2023)   Humiliation, Afraid, Rape, and Kick questionnaire    Fear of Current or Ex-Partner: No    Emotionally Abused: No    Physically Abused: No    Sexually Abused: No                                                                                                  Objective:  Physical Exam: BP (!) 140/68   Pulse 71   Temp 98 F (36.7 C) (Temporal)   Ht 5' 8 (1.727 m)   Wt 153 lb 12.8 oz (69.8 kg)   SpO2 96%   BMI 23.39 kg/m    Physical Exam           Physical Exam Constitutional:      Appearance: Normal appearance.  HENT:     Head: Normocephalic and atraumatic.     Right Ear: Hearing normal.     Left Ear: Hearing normal.     Nose: Nose normal.  Eyes:     General: No  scleral icterus.       Right eye: No discharge.        Left eye: No discharge.     Extraocular Movements: Extraocular movements intact.  Cardiovascular:     Comments: No cyanosis, no JVD Pulmonary:     Effort: Pulmonary effort is normal.     Comments: No auditory wheezing Musculoskeletal:     Right knee: Normal.     Left knee: Normal.     Right lower leg: Normal. No edema.     Left lower leg: Normal. No edema.     Right ankle: Normal.     Left ankle: Normal.     Right foot: Normal.     Left foot: Normal.     Comments: Normal Ambulation. No clubbing  Skin:    General: Skin is warm.     Findings: No rash.  Neurological:     General: No focal deficit present.     Mental Status: She is alert.     Cranial Nerves: No cranial nerve deficit.  Psychiatric:        Mood and Affect: Mood normal.        Behavior: Behavior normal.        Thought Content: Thought content normal.        Judgment: Judgment normal.     No results found.  Recent Results (from the past 2160 hours)  POC COVID-19     Status: None   Collection Time: 11/22/23 10:26 AM  Result Value Ref Range   SARS Coronavirus 2 Ag Negative Negative  POCT Influenza A/B     Status: None  Collection Time: 11/22/23 10:26 AM  Result Value Ref Range   Influenza A, POC Negative Negative   Influenza B, POC Negative Negative  Comprehensive metabolic panel with GFR     Status: Abnormal   Collection Time: 11/22/23 10:31 AM  Result Value Ref Range   Sodium 136 135 - 145 mEq/L   Potassium 3.8 3.5 - 5.1 mEq/L   Chloride 96 96 - 112 mEq/L   CO2 30 19 - 32 mEq/L   Glucose, Bld 114 (H) 70 - 99 mg/dL   BUN 23 6 - 23 mg/dL   Creatinine, Ser 9.20 0.40 - 1.20 mg/dL   Total Bilirubin 0.6 0.2 - 1.2 mg/dL   Alkaline Phosphatase 50 39 - 117 U/L   AST 22 0 - 37 U/L   ALT 18 0 - 35 U/L   Total Protein 7.6 6.0 - 8.3 g/dL   Albumin 4.5 3.5 - 5.2 g/dL   GFR 28.29 >39.99 mL/min    Comment: Calculated using the CKD-EPI Creatinine Equation  (2021)   Calcium  10.3 8.4 - 10.5 mg/dL  Direct LDL     Status: None   Collection Time: 11/22/23 10:31 AM  Result Value Ref Range   Direct LDL 84.0 mg/dL    Comment: Optimal:  <899 mg/dLNear or Above Optimal:  100-129 mg/dLBorderline High:  130-159 mg/dLHigh:  160-189 mg/dLVery High:  >190 mg/dL  TSH     Status: None   Collection Time: 01/14/24  2:40 PM  Result Value Ref Range   TSH 1.070 0.450 - 4.500 uIU/mL  Vitamin B12     Status: None   Collection Time: 01/14/24  2:40 PM  Result Value Ref Range   Vitamin B-12 1,195 232 - 1,245 pg/mL  ATN PROFILE     Status: Abnormal   Collection Time: 01/14/24  2:40 PM  Result Value Ref Range   A -- Beta-amyloid 42/40 Ratio 0.103 >0.102   Beta-amyloid 42 27.05 pg/mL   Beta-amyloid 40 261.40 pg/mL   T -- p-tau181 2.33 (H) 0.00 - 0.97 pg/mL   N -- NfL, Plasma 3.58 0.00 - 6.04 pg/mL   ATN SUMMARY Comment     Comment:                        A- T+ N- A high pTau181 concentration was observed. A normal beta-amyloid 42/40 ratio and normal concentration of NfL were observed at this time. These results are not consistent with the presence of Alzheimer's- related pathology, but may indicate pathology of another condition. Additional assessments may be necessary. These tests are intended to be used only in the context of clinical care.    Information: Comment     Comment: Beta-amyloid 42 and Beta-amyloid 40: Plasma beta-amyloid 1-42/1-40 ratios less than or equal to 0.102 suggest a higher probability of a patient being clinically diagnosed with Alzheimer's Disease (AD), while values above 0.102 suggest a lower probability of AD diagnosis. Precise plasma testing of Beta-amyloid 42 and Beta-amyloid 40 has demonstrated comparable effectiveness to traditional cerebrospinal fluid testing and amyloid positron emission tomography (PET) scans. When assessing the risk of AD pathology as the underlying cause for mild cognitive impairment (MCI) or dementia,  it is important to consider various factors such as medical and family history, nutritional deficiency biomarkers, neuroimaging, and physical, neurological, and neuropsychological examinations. These tests were developed and their performance characteristics determined by [facility]. They have not been cleared or approved by the Food and Drug Administration.                                                                    *  METHODOLOGY: Beta-amyloid 42/40 Ratio: Sysmex Chemiluminescence Enzyme Immunoassay (CLEIA) NfL and p-tau181: Tests performed by Roche Diagnostics Electrochemiluminescence Immunoassay (ECLIA). Values obtained with different methods cannot be used interchangeable. These tests were developed and their performance characteristics determined by [facility]. They have not been cleared or approved by the Food and Drug Administration.                                                                    * p-tau181 INFORMATION: For individual 72-21 years of age:  0.00-0.95 pg/mL Reference interval is based on a population of ostensibly healthy individuals aged 17 to 72 years For individual greater than 8 years of age:  0.00-0.97 pg/mL Results greater than the clinical cut-off of 0.97 pg/mL in patients greater than 30 years of age are correlated with Abeta amyloid pathology as determined by amyloid PET imaging.                                                                     * 1. Interpretation comments are based on a consensus between [facility] and the Internation Working Group recommendations for ATN panel interpretation published by Marinell dunker al 2018 and updated in [place] et al 2021.                                                                    *  Hampel H, Cummings J, Blennow K, Lia SHAUNNA Marinell [address], [place]. Developing the ATX(N) classification for use across the Alzheimer disease continiuum. Nat Rev Neurol. 2021 Sep;17(9):580-589.  Marinell CR Mickey Dolly DA, Blennow K, et al. A/T/N: An unbiased descriptive classification scheme for Alzheimer disease biomarkers. Neurology. 2016 Aug 2;87(5):539-547.   APOE Alzheimer's Risk     Status: None   Collection Time: 01/14/24  2:40 PM  Result Value Ref Range   APOE GENOTYPING RESULT: Comment     Comment: E3/E4 This individual has the APOE E3/E4 genotype. This result is positive for APOE E4, which is associated with increased risk for late-onset Alzheimer's disease. Results should be interpreted in clinical context.    ADDITIONAL COMMENTS Comment     Comment: This test cannot predict or rule out the development of Alzheimer's disease in an individual. Genetic counseling is recommended to discuss the potential clinical implications of positive results, as well as recommendations for testing family members.    LIMITATIONS Comment     Comment: APOE E2, E3, and E4 are validated for this analysis. The rare APOE E1/E1, E1/E2 are reported as failed results. E2/E4 and E1/E3 genotypes cannot be distinguished by this assay. Molecular-based testing is highly accurate, but as in any laboratory test, rare errors may occur. False positive or false negative results may occur for reasons that include genetic variants, blood  transfusions, bone marrow trans- plantation, somatic or tissue-specific mosaicism, mislabeled samples, or erroneous representation of family relationships.    METHODOLOGY Comment     Comment: Custom genotyping arrays ([employer] are used to perform genetic testing for two common variants in the APOE gene: [id number]_999958.4(APOE):c.388T>C(p.Cys130Arg)([id number]) and WF_999958.4(APOE):c.526C>T(p.Arg176Cys)([id number]). Three common combi- nations of the two variants include: APOE E2(Cys130,Cys176); APOE E3(Cys130,Arg176); and APOE E4 (Arg130,Arg176). Individuals are interpreted as having one of the following genotypes: E1/E4, E2/E2, E2/E3, E3/E3, E3/E4, and E4/E4, (E2/E4 or E1/E3).  The rare allele APOE E1 [phone]) is not validated in this assay. The E1/E4 genotype, if detected, will be reported. The genotypes E2/E4 and E1/E3 cannot be distinguished in our assay. However, the E1 allele is very rare, making E2/E4 more likely in patients with this indeterminate genotype result. Analytical sensitivity and specificity is greater than 99%.    BACKGROUND INFORMATION Comment     Comment: Alzheimer's disease is a progressive neurodegenerative disorder that affects more than 6.7 million Americans ages 38 and older and is the most common form of dementia in the elderly. Signs and symptoms may include cognitive decline, memory loss, loss of problem-solving skills, and personality changes. Clinical symp- toms appear after 60-65 years in 95% of cases. The development of late-onset disease may be influenced by age, sex, family- history, level of education, history of head trauma, and other factors including cardiovascular risk factors. Approximately 15-20% of late-onset disease may be familial. The APOE gene is a known susceptibility gene for Alzheimer's disease. The APOE gene encodes apolipoprotein E, which has a role in lipid metabolism. APOE has three commom alleles: E2, E3, and E4. The presence of the E4 allele increases the lifetime risk of Alzheimer's disease but is neither necessary nor sufficient for the development of Alzheimer's disease. APOE E2 may h ave some protective effect against development of late-onset disease (PMID: 21577118). APOE also has a rare allele: E1 (legacy name E3r). There is insufficient evidence to assess the clinical association of APOE E1 with Alzheimer's disease PMID: 83416565.    REFERENCES Comment     Comment: 1) Bird TD. Alzheimer Disease Overview. GeneReviews (internet).    Pagon RA et al., editors. [place]: [employer] , [place], [place]. Last revised 2014. PMID: 79698659. 2) Jerome PURCHASE et al. Genetic counseling  and testing for    Alzheimer disease: Joint Practice guidelines of the    [employer] and the [employer]. Genet in Med 2011;13(6)    597-605. PMID: 78422881. 3) Ahmad RADDLE et al. The fourth apolipoprotein E haplotype    found in the [place] of [place]. Am J Med Genet B    Neuropsychiatr Genet. 2006 Jun 5;141B(4):426-7.    PMID: 83416565.    RESULT RELEASE: Comment     Comment: Report released under the direction of Meade Kettering, PhD, [facility]  POCT glycosylated hemoglobin (Hb A1C)     Status: Abnormal   Collection Time: 01/29/24  4:26 PM  Result Value Ref Range   Hemoglobin A1C 7.0 (A) 4.0 - 5.6 %   HbA1c POC (<> result, manual entry)     HbA1c, POC (prediabetic range)     HbA1c, POC (controlled diabetic range)          Beverley KATHEE Hummer, MD  I,Emily Lagle,acting as a scribe for Beverley KATHEE Hummer, MD.,have documented all relevant documentation on the behalf of Beverley KATHEE Hummer, MD.  LILLETTE Beverley KATHEE Hummer, MD, have reviewed all documentation  for this visit. The documentation on 02/13/2024 for the exam, diagnosis, procedures, and orders are all accurate and complete.

## 2024-02-13 NOTE — Patient Instructions (Signed)
 It was very nice to see you today!  VISIT SUMMARY: Today, we discussed the abnormal temperature sensation in your right leg, your diabetes management, peripheral arterial disease, dementia, hypertension, hyperlipidemia, and chronic low back pain. We reviewed your current medications and management plans for each condition.  YOUR PLAN: ABNORMAL TEMPERATURE SENSATION OF RIGHT AND LEFT LOWER EXTREMITIES: You reported that your right leg feels warmer and your left leg feels cooler. The examination showed no significant temperature difference or signs of infection. -Continue to monitor for any pain, swelling, redness, skin breakdown, chest pain, or shortness of breath.  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 You have neuropathy, which affects your sensation. -Continue taking Farxiga  10 mg daily. -Continue taking gabapentin  300 mg three times daily.  PERIPHERAL ARTERIAL DISEASE OF LOWER EXTREMITIES: You have peripheral arterial disease related to your diabetes. There are no acute changes noted. -Continue with your current management and monitor for any changes.  DEMENTIA: You have dementia and are following up with neurology for memory management. -Continue follow-up with neurology.  ESSENTIAL HYPERTENSION: Your blood pressure is slightly elevated at 140/68. -Continue taking lisinopril  5 mg daily. -Continue taking hydrochlorothiazide  25 mg daily.  HYPERLIPIDEMIA: Your cholesterol levels are being managed with medication. -Continue taking atorvastatin  40 mg daily.  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 -Continue with your current management.  No follow-ups on file.   Take care, Arvella Hummer, MD, MS   PLEASE NOTE:  If you had any lab tests, please let us  know if you have not heard back within a few  days. You may see your results on mychart before we have a chance to review them but we will give you a call once they are reviewed by us .   If we ordered any referrals today, please let us  know if you have not heard from their office within the next week.   If you had any urgent prescriptions sent in today, please check with the pharmacy within an hour of our visit to make sure the prescription was transmitted appropriately.   Please try these tips to maintain a healthy lifestyle:  Eat at least 3 REAL meals and 1-2 snacks per day.  Aim for no more than 5 hours between eating.  If you eat breakfast, please do so within one hour of getting up.   Each meal should contain half fruits/vegetables, one quarter protein, and one quarter carbs (no bigger than a computer mouse)  Cut down on sweet beverages. This includes juice, soda, and sweet tea.   Drink at least 1 glass of water with each meal and aim for at least 8 glasses per day  Exercise at least 150 minutes every week.

## 2024-02-17 DIAGNOSIS — F0390 Unspecified dementia without behavioral disturbance: Secondary | ICD-10-CM | POA: Insufficient documentation

## 2024-03-17 ENCOUNTER — Other Ambulatory Visit: Payer: Self-pay | Admitting: Nurse Practitioner

## 2024-03-17 DIAGNOSIS — E785 Hyperlipidemia, unspecified: Secondary | ICD-10-CM

## 2024-03-17 DIAGNOSIS — E1151 Type 2 diabetes mellitus with diabetic peripheral angiopathy without gangrene: Secondary | ICD-10-CM

## 2024-03-20 NOTE — Telephone Encounter (Signed)
 Refill request for  Atorvastatin  40 mg LR  05/22/23, #100, 1 rf  Gabapentin  300 mg LR 09/10/23, #300, 1 rf LOV 11/22/23 FOV 05/21/24  Please review and advise.  Thanks. Dm/cma

## 2024-03-24 ENCOUNTER — Encounter: Payer: Self-pay | Admitting: Family Medicine

## 2024-05-01 ENCOUNTER — Ambulatory Visit: Admitting: Endocrinology

## 2024-05-21 ENCOUNTER — Ambulatory Visit: Admitting: Nurse Practitioner

## 2024-06-12 ENCOUNTER — Ambulatory Visit: Admitting: Nurse Practitioner

## 2024-10-17 ENCOUNTER — Ambulatory Visit
